# Patient Record
Sex: Female | Born: 1965 | Race: White | Hispanic: No | Marital: Married | Smoking: Never smoker
Health system: Southern US, Community
[De-identification: ages and names within clinical notes are randomized; demographics above are authoritative.]

## PROBLEM LIST (undated history)

## (undated) DIAGNOSIS — C801 Malignant (primary) neoplasm, unspecified: Secondary | ICD-10-CM

## (undated) DIAGNOSIS — M1990 Unspecified osteoarthritis, unspecified site: Secondary | ICD-10-CM

## (undated) DIAGNOSIS — R112 Nausea with vomiting, unspecified: Secondary | ICD-10-CM

## (undated) DIAGNOSIS — G709 Myoneural disorder, unspecified: Secondary | ICD-10-CM

## (undated) DIAGNOSIS — K219 Gastro-esophageal reflux disease without esophagitis: Secondary | ICD-10-CM

## (undated) DIAGNOSIS — Z9889 Other specified postprocedural states: Secondary | ICD-10-CM

## (undated) DIAGNOSIS — F419 Anxiety disorder, unspecified: Secondary | ICD-10-CM

## (undated) DIAGNOSIS — D649 Anemia, unspecified: Secondary | ICD-10-CM

## (undated) DIAGNOSIS — Z8489 Family history of other specified conditions: Secondary | ICD-10-CM

## (undated) HISTORY — PX: BREAST SURGERY: SHX581

## (undated) HISTORY — PX: POLYPECTOMY: SHX149

## (undated) HISTORY — PX: ESOPHAGOGASTRODUODENOSCOPY (EGD) WITH ESOPHAGEAL DILATION: SHX5812

## (undated) HISTORY — PX: EYE SURGERY: SHX253

## (undated) HISTORY — PX: KNEE SURGERY: SHX244

## (undated) HISTORY — DX: Unspecified osteoarthritis, unspecified site: M19.90

## (undated) HISTORY — PX: FRACTURE SURGERY: SHX138

## (undated) HISTORY — DX: Myoneural disorder, unspecified: G70.9

## (undated) HISTORY — DX: Gastro-esophageal reflux disease without esophagitis: K21.9

## (undated) HISTORY — PX: SHOULDER SURGERY: SHX246

## (undated) HISTORY — PX: COLONOSCOPY: SHX174

## (undated) HISTORY — PX: FACIAL FRACTURE SURGERY: SHX1570

---

## 1998-03-04 ENCOUNTER — Inpatient Hospital Stay (HOSPITAL_COMMUNITY): Admission: AD | Admit: 1998-03-04 | Discharge: 1998-03-04 | Payer: Self-pay | Admitting: Obstetrics and Gynecology

## 1998-03-08 ENCOUNTER — Inpatient Hospital Stay (HOSPITAL_COMMUNITY): Admission: AD | Admit: 1998-03-08 | Discharge: 1998-03-08 | Payer: Self-pay | Admitting: *Deleted

## 1998-03-13 ENCOUNTER — Inpatient Hospital Stay (HOSPITAL_COMMUNITY): Admission: AD | Admit: 1998-03-13 | Discharge: 1998-03-16 | Payer: Self-pay | Admitting: *Deleted

## 1998-03-16 ENCOUNTER — Encounter (HOSPITAL_COMMUNITY): Admission: RE | Admit: 1998-03-16 | Discharge: 1998-06-14 | Payer: Self-pay | Admitting: *Deleted

## 1999-03-27 ENCOUNTER — Emergency Department (HOSPITAL_COMMUNITY): Admission: EM | Admit: 1999-03-27 | Discharge: 1999-03-27 | Payer: Self-pay | Admitting: Emergency Medicine

## 1999-03-29 ENCOUNTER — Encounter: Payer: Self-pay | Admitting: *Deleted

## 1999-03-29 ENCOUNTER — Ambulatory Visit: Admission: RE | Admit: 1999-03-29 | Discharge: 1999-03-29 | Payer: Self-pay | Admitting: *Deleted

## 1999-04-18 ENCOUNTER — Encounter: Admission: RE | Admit: 1999-04-18 | Discharge: 1999-07-17 | Payer: Self-pay | Admitting: *Deleted

## 1999-07-27 ENCOUNTER — Other Ambulatory Visit: Admission: RE | Admit: 1999-07-27 | Discharge: 1999-07-27 | Payer: Self-pay | Admitting: *Deleted

## 2000-08-27 ENCOUNTER — Other Ambulatory Visit: Admission: RE | Admit: 2000-08-27 | Discharge: 2000-08-27 | Payer: Self-pay | Admitting: *Deleted

## 2001-09-24 ENCOUNTER — Other Ambulatory Visit: Admission: RE | Admit: 2001-09-24 | Discharge: 2001-09-24 | Payer: Self-pay | Admitting: *Deleted

## 2002-05-23 ENCOUNTER — Encounter: Admission: RE | Admit: 2002-05-23 | Discharge: 2002-05-23 | Payer: Self-pay | Admitting: Family Medicine

## 2002-05-23 ENCOUNTER — Encounter: Payer: Self-pay | Admitting: Family Medicine

## 2002-09-06 ENCOUNTER — Emergency Department (HOSPITAL_COMMUNITY): Admission: EM | Admit: 2002-09-06 | Discharge: 2002-09-06 | Payer: Self-pay | Admitting: Emergency Medicine

## 2002-09-06 ENCOUNTER — Encounter: Payer: Self-pay | Admitting: Emergency Medicine

## 2002-12-27 ENCOUNTER — Other Ambulatory Visit: Admission: RE | Admit: 2002-12-27 | Discharge: 2002-12-27 | Payer: Self-pay | Admitting: *Deleted

## 2004-01-09 ENCOUNTER — Other Ambulatory Visit: Admission: RE | Admit: 2004-01-09 | Discharge: 2004-01-09 | Payer: Self-pay | Admitting: *Deleted

## 2004-03-15 ENCOUNTER — Encounter: Admission: RE | Admit: 2004-03-15 | Discharge: 2004-03-15 | Payer: Self-pay | Admitting: Family Medicine

## 2005-01-16 ENCOUNTER — Other Ambulatory Visit: Admission: RE | Admit: 2005-01-16 | Discharge: 2005-01-16 | Payer: Self-pay | Admitting: Obstetrics and Gynecology

## 2006-02-20 ENCOUNTER — Other Ambulatory Visit: Admission: RE | Admit: 2006-02-20 | Discharge: 2006-02-20 | Payer: Self-pay | Admitting: Obstetrics and Gynecology

## 2006-08-21 ENCOUNTER — Emergency Department (HOSPITAL_COMMUNITY): Admission: EM | Admit: 2006-08-21 | Discharge: 2006-08-21 | Payer: Self-pay | Admitting: Family Medicine

## 2006-08-21 ENCOUNTER — Ambulatory Visit (HOSPITAL_COMMUNITY): Admission: RE | Admit: 2006-08-21 | Discharge: 2006-08-21 | Payer: Self-pay | Admitting: Family Medicine

## 2009-03-16 ENCOUNTER — Emergency Department (HOSPITAL_COMMUNITY): Admission: EM | Admit: 2009-03-16 | Discharge: 2009-03-16 | Payer: Self-pay | Admitting: *Deleted

## 2010-04-09 ENCOUNTER — Encounter: Admission: RE | Admit: 2010-04-09 | Discharge: 2010-04-09 | Payer: Self-pay | Admitting: Obstetrics and Gynecology

## 2010-12-25 ENCOUNTER — Emergency Department (HOSPITAL_COMMUNITY)
Admission: EM | Admit: 2010-12-25 | Discharge: 2010-12-25 | Payer: Self-pay | Source: Home / Self Care | Admitting: Emergency Medicine

## 2010-12-31 LAB — URINALYSIS, ROUTINE W REFLEX MICROSCOPIC
Bilirubin Urine: NEGATIVE
Nitrite: NEGATIVE
Protein, ur: 30 mg/dL — AB
Specific Gravity, Urine: 1.03 (ref 1.005–1.030)
Urine Glucose, Fasting: NEGATIVE mg/dL
Urobilinogen, UA: 0.2 mg/dL (ref 0.0–1.0)
pH: 6.5 (ref 5.0–8.0)

## 2010-12-31 LAB — DIFFERENTIAL
Basophils Absolute: 0.1 K/uL (ref 0.0–0.1)
Basophils Relative: 1 % (ref 0–1)
Eosinophils Absolute: 0.1 K/uL (ref 0.0–0.7)
Eosinophils Relative: 1 % (ref 0–5)
Lymphocytes Relative: 22 % (ref 12–46)
Lymphs Abs: 1.5 K/uL (ref 0.7–4.0)
Monocytes Absolute: 0.6 K/uL (ref 0.1–1.0)
Monocytes Relative: 8 % (ref 3–12)
Neutro Abs: 4.7 K/uL (ref 1.7–7.7)
Neutrophils Relative %: 68 % (ref 43–77)

## 2010-12-31 LAB — POCT I-STAT, CHEM 8
BUN: 9 mg/dL (ref 6–23)
Calcium, Ion: 1.28 mmol/L (ref 1.12–1.32)
Chloride: 103 meq/L (ref 96–112)
Creatinine, Ser: 1 mg/dL (ref 0.4–1.2)
Glucose, Bld: 88 mg/dL (ref 70–99)
HCT: 39 % (ref 36.0–46.0)
Hemoglobin: 13.3 g/dL (ref 12.0–15.0)
Potassium: 3.5 meq/L (ref 3.5–5.1)
Sodium: 139 meq/L (ref 135–145)
TCO2: 28 mmol/L (ref 0–100)

## 2010-12-31 LAB — URINE MICROSCOPIC-ADD ON

## 2010-12-31 LAB — CBC
HCT: 36.2 % (ref 36.0–46.0)
Hemoglobin: 12.1 g/dL (ref 12.0–15.0)
MCH: 29.1 pg (ref 26.0–34.0)
MCHC: 33.4 g/dL (ref 30.0–36.0)
MCV: 87 fL (ref 78.0–100.0)
Platelets: 319 K/uL (ref 150–400)
RBC: 4.16 MIL/uL (ref 3.87–5.11)
RDW: 12.9 % (ref 11.5–15.5)
WBC: 6.9 K/uL (ref 4.0–10.5)

## 2010-12-31 LAB — POCT PREGNANCY, URINE: Preg Test, Ur: NEGATIVE

## 2011-01-06 ENCOUNTER — Encounter: Payer: Self-pay | Admitting: Family Medicine

## 2011-03-27 LAB — URINALYSIS, ROUTINE W REFLEX MICROSCOPIC
Bilirubin Urine: NEGATIVE
Glucose, UA: NEGATIVE mg/dL
Ketones, ur: 15 mg/dL — AB
Nitrite: NEGATIVE
Protein, ur: NEGATIVE mg/dL
Specific Gravity, Urine: 1.015 (ref 1.005–1.030)
Urobilinogen, UA: 0.2 mg/dL (ref 0.0–1.0)
pH: 8 (ref 5.0–8.0)

## 2011-03-27 LAB — COMPREHENSIVE METABOLIC PANEL
ALT: 15 U/L (ref 0–35)
AST: 26 U/L (ref 0–37)
Albumin: 4 g/dL (ref 3.5–5.2)
Alkaline Phosphatase: 49 U/L (ref 39–117)
BUN: 9 mg/dL (ref 6–23)
CO2: 22 mEq/L (ref 19–32)
Calcium: 9.4 mg/dL (ref 8.4–10.5)
Chloride: 106 mEq/L (ref 96–112)
Creatinine, Ser: 0.72 mg/dL (ref 0.4–1.2)
GFR calc Af Amer: >60 mL/min (ref >60)
GFR calc non Af Amer: >60 mL/min (ref >60)
Glucose, Bld: 126 mg/dL — ABNORMAL HIGH (ref 70–99)
Potassium: 3.8 mEq/L (ref 3.5–5.1)
Sodium: 136 mEq/L (ref 135–145)
Total Bilirubin: 0.5 mg/dL (ref 0.3–1.2)
Total Protein: 6.8 g/dL (ref 6.0–8.3)

## 2011-03-27 LAB — SAMPLE TO BLOOD BANK

## 2011-03-27 LAB — PROTIME-INR
INR: 1.1 (ref 0.00–1.49)
Prothrombin Time: 14.1 seconds (ref 11.6–15.2)

## 2011-03-27 LAB — CBC
HCT: 33.5 % — ABNORMAL LOW (ref 36.0–46.0)
Hemoglobin: 11.6 g/dL — ABNORMAL LOW (ref 12.0–15.0)
MCHC: 34.5 g/dL (ref 30.0–36.0)
MCV: 86.3 fL (ref 78.0–100.0)
Platelets: 327 10*3/uL (ref 150–400)
RBC: 3.88 MIL/uL (ref 3.87–5.11)
RDW: 13 % (ref 11.5–15.5)
WBC: 8.1 10*3/uL (ref 4.0–10.5)

## 2011-03-27 LAB — DIFFERENTIAL
Basophils Absolute: 0.1 10*3/uL (ref 0.0–0.1)
Basophils Relative: 1 % (ref 0–1)
Eosinophils Absolute: 0 10*3/uL (ref 0.0–0.7)
Eosinophils Relative: 1 % (ref 0–5)
Lymphocytes Relative: 17 % (ref 12–46)
Lymphs Abs: 1.4 10*3/uL (ref 0.7–4.0)
Monocytes Absolute: 0.5 10*3/uL (ref 0.1–1.0)
Monocytes Relative: 6 % (ref 3–12)
Neutro Abs: 6.1 10*3/uL (ref 1.7–7.7)
Neutrophils Relative %: 75 % (ref 43–77)

## 2011-03-27 LAB — URINE MICROSCOPIC-ADD ON

## 2013-10-19 ENCOUNTER — Other Ambulatory Visit: Payer: Self-pay | Admitting: Sports Medicine

## 2013-10-19 DIAGNOSIS — M84369A Stress fracture, unspecified tibia and fibula, initial encounter for fracture: Secondary | ICD-10-CM

## 2013-10-22 ENCOUNTER — Ambulatory Visit
Admission: RE | Admit: 2013-10-22 | Discharge: 2013-10-22 | Disposition: A | Discharge status: Home / Self Care | Payer: BC Managed Care – PPO | Source: Ambulatory Visit | Attending: Sports Medicine | Admitting: Sports Medicine

## 2013-10-22 DIAGNOSIS — M84369A Stress fracture, unspecified tibia and fibula, initial encounter for fracture: Secondary | ICD-10-CM

## 2014-10-10 ENCOUNTER — Other Ambulatory Visit: Payer: Self-pay | Admitting: Obstetrics and Gynecology

## 2014-10-10 DIAGNOSIS — R928 Other abnormal and inconclusive findings on diagnostic imaging of breast: Secondary | ICD-10-CM

## 2014-10-12 ENCOUNTER — Ambulatory Visit
Admission: RE | Admit: 2014-10-12 | Discharge: 2014-10-12 | Disposition: A | Discharge status: Home / Self Care | Payer: BC Managed Care – PPO | Source: Ambulatory Visit | Attending: Obstetrics and Gynecology | Admitting: Obstetrics and Gynecology

## 2014-10-12 DIAGNOSIS — R928 Other abnormal and inconclusive findings on diagnostic imaging of breast: Secondary | ICD-10-CM

## 2014-11-05 ENCOUNTER — Encounter (HOSPITAL_BASED_OUTPATIENT_CLINIC_OR_DEPARTMENT_OTHER): Payer: Self-pay | Admitting: *Deleted

## 2014-11-05 ENCOUNTER — Emergency Department (HOSPITAL_BASED_OUTPATIENT_CLINIC_OR_DEPARTMENT_OTHER): Payer: BC Managed Care – PPO

## 2014-11-05 ENCOUNTER — Emergency Department (HOSPITAL_BASED_OUTPATIENT_CLINIC_OR_DEPARTMENT_OTHER)
Admission: EM | Admit: 2014-11-05 | Discharge: 2014-11-05 | Disposition: A | Discharge status: Home / Self Care | Payer: BC Managed Care – PPO | Attending: Emergency Medicine | Admitting: Emergency Medicine

## 2014-11-05 ENCOUNTER — Emergency Department (HOSPITAL_COMMUNITY): Payer: BC Managed Care – PPO

## 2014-11-05 DIAGNOSIS — Y9289 Other specified places as the place of occurrence of the external cause: Secondary | ICD-10-CM | POA: Diagnosis not present

## 2014-11-05 DIAGNOSIS — Y998 Other external cause status: Secondary | ICD-10-CM | POA: Insufficient documentation

## 2014-11-05 DIAGNOSIS — W01198A Fall on same level from slipping, tripping and stumbling with subsequent striking against other object, initial encounter: Secondary | ICD-10-CM | POA: Diagnosis not present

## 2014-11-05 DIAGNOSIS — S0990XA Unspecified injury of head, initial encounter: Secondary | ICD-10-CM | POA: Diagnosis present

## 2014-11-05 DIAGNOSIS — S060X0A Concussion without loss of consciousness, initial encounter: Secondary | ICD-10-CM | POA: Insufficient documentation

## 2014-11-05 DIAGNOSIS — R519 Headache, unspecified: Secondary | ICD-10-CM

## 2014-11-05 DIAGNOSIS — R51 Headache: Secondary | ICD-10-CM

## 2014-11-05 DIAGNOSIS — S161XXA Strain of muscle, fascia and tendon at neck level, initial encounter: Secondary | ICD-10-CM | POA: Insufficient documentation

## 2014-11-05 DIAGNOSIS — Y9351 Activity, roller skating (inline) and skateboarding: Secondary | ICD-10-CM | POA: Diagnosis not present

## 2014-11-05 MED ORDER — CYCLOBENZAPRINE HCL 10 MG PO TABS: 10.0000 mg | ORAL_TABLET | Freq: Two times a day (BID) | ORAL | Status: DC | PRN

## 2014-11-05 MED ORDER — HYDROCODONE-ACETAMINOPHEN 5-325 MG PO TABS: 1.0000 | ORAL_TABLET | Freq: Four times a day (QID) | ORAL | Status: DC | PRN

## 2014-11-05 NOTE — ED Notes (Signed)
Pt reports she took a bad fall during speed skating practice last night and "smacked my head"- pt was wearing helmet- denies LOC- c/o neck pain- c-collar applied in triage

## 2014-11-05 NOTE — ED Provider Notes (Signed)
CSN: [id number]     Arrival date & time 11/05/14  1349 History   First MD Initiated Contact with Patient 11/05/14 1431     Chief Complaint  Patient presents with  . Head Injury     (Consider location/radiation/quality/duration/timing/severity/associated sxs/prior Treatment) Patient is a 48 y.o. female presenting with head injury. The history is provided by the patient.  Head Injury Location:  R temporal Time since incident:  1 day Mechanism of injury: fall   Mechanism of injury comment:  She was speed rollskating and fell hitting her head.  she was wearing helmet Pain details:    Quality:  Sharp and aching   Severity:  Moderate   Timing:  Constant   Progression:  Unchanged Chronicity:  New Relieved by:  Nothing Worsened by:  Movement Ineffective treatments:  NSAIDs Associated symptoms: neck pain   Associated symptoms: no blurred vision, no difficulty breathing, no disorientation, no focal weakness, no nausea, no numbness, no seizures, no tinnitus and no vomiting   Associated symptoms comment:  Initially when fell was dazed but since just headache   History reviewed. No pertinent past medical history. Past Surgical History  Procedure Laterality Date  . Facial fracture surgery    . Shoulder surgery     No family history on file. History  Substance Use Topics  . Smoking status: Never Smoker   . Smokeless tobacco: Not on file  . Alcohol Use: Yes     Comment: occasional   OB History    No data available     Review of Systems  HENT: Negative for tinnitus.   Eyes: Negative for blurred vision.  Gastrointestinal: Negative for nausea and vomiting.  Musculoskeletal: Positive for neck pain.  Neurological: Negative for focal weakness, seizures and numbness.  All other systems reviewed and are negative.     Allergies  Codeine and Tramadol  Home Medications   Prior to Admission medications   Medication Sig Start Date End Date Taking? Authorizing Provider  ibuprofen  (ADVIL,MOTRIN) 200 MG tablet Take 600 mg by mouth every 6 (six) hours as needed.   Yes Historical Provider, MD   BP 99/58 mmHg  Pulse 70  Temp(Src) 98.1 F (36.7 C) (Oral)  Resp 18  Ht 5\' 3"  (1.6 m)  Wt 140 lb (63.504 kg)  BMI 24.81 kg/m2  SpO2 97% Physical Exam  Constitutional: She is oriented to person, place, and time. She appears well-developed and well-nourished. No distress.  HENT:  Head: Normocephalic. Head is with contusion.    Right Ear: Tympanic membrane and ear canal normal.  Left Ear: Tympanic membrane and ear canal normal.  Eyes: EOM are normal. Pupils are equal, round, and reactive to light.  Neck: Spinous process tenderness and muscular tenderness present.    Cardiovascular: Normal rate, regular rhythm, normal heart sounds and intact distal pulses.  Exam reveals no friction rub.   No murmur heard. Pulmonary/Chest: Effort normal and breath sounds normal. She has no wheezes. She has no rales.  Abdominal: Soft. Bowel sounds are normal. She exhibits no distension. There is no tenderness. There is no rebound and no guarding.  Musculoskeletal: Normal range of motion. She exhibits no tenderness.  No edema  Neurological: She is alert and oriented to person, place, and time. No cranial nerve deficit.  Skin: Skin is warm and dry. No rash noted.  Psychiatric: She has a normal mood and affect. Her behavior is normal.  Nursing note and vitals reviewed.   ED Course  Procedures (including critical  care time) Labs Review Labs Reviewed - No data to display  Imaging Review Ct Head Wo Contrast  11/05/2014   CLINICAL DATA:  Patient states she fell last night while speed skating and hit her head on the right side. Patient wearing helmet  EXAM: CT HEAD WITHOUT CONTRAST  CT CERVICAL SPINE WITHOUT CONTRAST  TECHNIQUE: Multidetector CT imaging of the head and cervical spine was performed following the standard protocol without intravenous contrast. Multiplanar CT image  reconstructions of the cervical spine were also generated.  COMPARISON:  None.  FINDINGS: CT HEAD FINDINGS  No intracranial hemorrhage. No parenchymal contusion. No midline shift or mass effect. Basilar cisterns are patent. No skull base fracture. No fluid in the paranasal sinuses or mastoid air cells. Orbits are normal.  CT CERVICAL SPINE FINDINGS  No prevertebral soft tissue swelling. Normal alignment of cervical vertebral bodies. No loss of vertebral body height. Normal facet articulation. Normal craniocervical junction.  No evidence epidural or paraspinal hematoma.  IMPRESSION: 1. Normal head CT. 2. No  cervical spine fracture   Electronically Signed   By: Suzy Bouchard M.D.   On: 11/05/2014 15:24   Ct Cervical Spine Wo Contrast  11/05/2014   CLINICAL DATA:  Patient states she fell last night while speed skating and hit her head on the right side. Patient wearing helmet  EXAM: CT HEAD WITHOUT CONTRAST  CT CERVICAL SPINE WITHOUT CONTRAST  TECHNIQUE: Multidetector CT imaging of the head and cervical spine was performed following the standard protocol without intravenous contrast. Multiplanar CT image reconstructions of the cervical spine were also generated.  COMPARISON:  None.  FINDINGS: CT HEAD FINDINGS  No intracranial hemorrhage. No parenchymal contusion. No midline shift or mass effect. Basilar cisterns are patent. No skull base fracture. No fluid in the paranasal sinuses or mastoid air cells. Orbits are normal.  CT CERVICAL SPINE FINDINGS  No prevertebral soft tissue swelling. Normal alignment of cervical vertebral bodies. No loss of vertebral body height. Normal facet articulation. Normal craniocervical junction.  No evidence epidural or paraspinal hematoma.  IMPRESSION: 1. Normal head CT. 2. No  cervical spine fracture   Electronically Signed   By: Suzy Bouchard M.D.   On: 11/05/2014 15:24     EKG Interpretation None      MDM   Final diagnoses:  Headache  Concussion, without loss of  consciousness, initial encounter  Cervical strain, initial encounter    Patient with a head injury last night while speed skating. Also complaining of headache and neck pain. No focal neurologic deficits on exam. Mild contusion to the right side of the head. She was wearing a helmet. CT of the head and neck is negative. Patient diagnosed with concussion and given pain control and muscle relaxers. She was discharged home.    Blanchie Dessert, MD 11/05/14 1529

## 2015-09-11 ENCOUNTER — Other Ambulatory Visit: Payer: Self-pay | Admitting: Obstetrics and Gynecology

## 2015-09-11 DIAGNOSIS — R921 Mammographic calcification found on diagnostic imaging of breast: Secondary | ICD-10-CM

## 2015-10-25 ENCOUNTER — Ambulatory Visit
Admission: RE | Admit: 2015-10-25 | Discharge: 2015-10-25 | Disposition: A | Discharge status: Home / Self Care | Payer: BLUE CROSS/BLUE SHIELD | Source: Ambulatory Visit | Attending: Obstetrics and Gynecology | Admitting: Obstetrics and Gynecology

## 2015-10-25 DIAGNOSIS — R921 Mammographic calcification found on diagnostic imaging of breast: Secondary | ICD-10-CM

## 2016-05-21 DIAGNOSIS — K219 Gastro-esophageal reflux disease without esophagitis: Secondary | ICD-10-CM | POA: Insufficient documentation

## 2016-07-08 ENCOUNTER — Other Ambulatory Visit: Payer: Self-pay | Admitting: Obstetrics and Gynecology

## 2016-07-08 DIAGNOSIS — R921 Mammographic calcification found on diagnostic imaging of breast: Secondary | ICD-10-CM

## 2016-10-31 ENCOUNTER — Ambulatory Visit
Admission: RE | Admit: 2016-10-31 | Discharge: 2016-10-31 | Disposition: A | Discharge status: Home / Self Care | Payer: BLUE CROSS/BLUE SHIELD | Source: Ambulatory Visit | Attending: Obstetrics and Gynecology | Admitting: Obstetrics and Gynecology

## 2016-10-31 ENCOUNTER — Other Ambulatory Visit: Payer: Self-pay | Admitting: Obstetrics and Gynecology

## 2016-10-31 DIAGNOSIS — N6320 Unspecified lump in the left breast, unspecified quadrant: Secondary | ICD-10-CM

## 2016-10-31 DIAGNOSIS — N6310 Unspecified lump in the right breast, unspecified quadrant: Secondary | ICD-10-CM

## 2016-10-31 DIAGNOSIS — R921 Mammographic calcification found on diagnostic imaging of breast: Secondary | ICD-10-CM

## 2017-04-15 DIAGNOSIS — R7989 Other specified abnormal findings of blood chemistry: Secondary | ICD-10-CM | POA: Insufficient documentation

## 2017-07-21 ENCOUNTER — Other Ambulatory Visit: Payer: Self-pay | Admitting: Physician Assistant

## 2017-07-21 DIAGNOSIS — M5442 Lumbago with sciatica, left side: Secondary | ICD-10-CM

## 2017-07-29 ENCOUNTER — Other Ambulatory Visit: Payer: BLUE CROSS/BLUE SHIELD

## 2017-09-05 ENCOUNTER — Ambulatory Visit
Admission: RE | Admit: 2017-09-05 | Discharge: 2017-09-05 | Disposition: A | Discharge status: Home / Self Care | Payer: BLUE CROSS/BLUE SHIELD | Source: Ambulatory Visit | Attending: Physician Assistant | Admitting: Physician Assistant

## 2017-09-05 DIAGNOSIS — M5442 Lumbago with sciatica, left side: Secondary | ICD-10-CM

## 2017-09-05 MED ORDER — GADOBENATE DIMEGLUMINE 529 MG/ML IV SOLN
15.0000 mL | Freq: Once | INTRAVENOUS | Status: AC | PRN
Start: 2017-09-05 — End: 2017-09-05
  Administered 2017-09-05: 15 mL via INTRAVENOUS

## 2017-09-29 ENCOUNTER — Other Ambulatory Visit: Payer: Self-pay | Admitting: Obstetrics and Gynecology

## 2017-09-29 DIAGNOSIS — R921 Mammographic calcification found on diagnostic imaging of breast: Secondary | ICD-10-CM

## 2017-11-03 ENCOUNTER — Ambulatory Visit
Admission: RE | Admit: 2017-11-03 | Discharge: 2017-11-03 | Disposition: A | Discharge status: Home / Self Care | Payer: BLUE CROSS/BLUE SHIELD | Source: Ambulatory Visit | Attending: Obstetrics and Gynecology | Admitting: Obstetrics and Gynecology

## 2017-11-03 ENCOUNTER — Other Ambulatory Visit: Payer: Self-pay | Admitting: Obstetrics and Gynecology

## 2017-11-03 DIAGNOSIS — R921 Mammographic calcification found on diagnostic imaging of breast: Secondary | ICD-10-CM

## 2017-11-03 DIAGNOSIS — N6320 Unspecified lump in the left breast, unspecified quadrant: Secondary | ICD-10-CM

## 2017-11-05 ENCOUNTER — Other Ambulatory Visit: Payer: Self-pay | Admitting: Obstetrics and Gynecology

## 2017-11-05 ENCOUNTER — Ambulatory Visit
Admission: RE | Admit: 2017-11-05 | Discharge: 2017-11-05 | Disposition: A | Discharge status: Home / Self Care | Payer: BLUE CROSS/BLUE SHIELD | Source: Ambulatory Visit | Attending: Obstetrics and Gynecology | Admitting: Obstetrics and Gynecology

## 2017-11-05 DIAGNOSIS — N6320 Unspecified lump in the left breast, unspecified quadrant: Secondary | ICD-10-CM

## 2017-11-05 DIAGNOSIS — R921 Mammographic calcification found on diagnostic imaging of breast: Secondary | ICD-10-CM

## 2017-12-25 DIAGNOSIS — M722 Plantar fascial fibromatosis: Secondary | ICD-10-CM | POA: Insufficient documentation

## 2017-12-25 DIAGNOSIS — M67439 Ganglion, unspecified wrist: Secondary | ICD-10-CM | POA: Insufficient documentation

## 2018-03-18 DIAGNOSIS — M25561 Pain in right knee: Secondary | ICD-10-CM | POA: Insufficient documentation

## 2018-03-18 DIAGNOSIS — S8991XA Unspecified injury of right lower leg, initial encounter: Secondary | ICD-10-CM | POA: Insufficient documentation

## 2018-05-02 DIAGNOSIS — S83249A Other tear of medial meniscus, current injury, unspecified knee, initial encounter: Secondary | ICD-10-CM | POA: Insufficient documentation

## 2018-06-17 DIAGNOSIS — Z96651 Presence of right artificial knee joint: Secondary | ICD-10-CM | POA: Insufficient documentation

## 2018-11-18 ENCOUNTER — Other Ambulatory Visit: Payer: Self-pay | Admitting: Family Medicine

## 2018-11-18 ENCOUNTER — Ambulatory Visit
Admission: RE | Admit: 2018-11-18 | Discharge: 2018-11-18 | Disposition: A | Discharge status: Home / Self Care | Payer: BLUE CROSS/BLUE SHIELD | Source: Ambulatory Visit | Attending: Family Medicine | Admitting: Family Medicine

## 2018-11-18 DIAGNOSIS — R059 Cough, unspecified: Secondary | ICD-10-CM

## 2018-11-18 DIAGNOSIS — R05 Cough: Secondary | ICD-10-CM

## 2018-11-23 ENCOUNTER — Other Ambulatory Visit: Payer: Self-pay | Admitting: Family Medicine

## 2018-11-23 DIAGNOSIS — R059 Cough, unspecified: Secondary | ICD-10-CM

## 2018-11-23 DIAGNOSIS — D509 Iron deficiency anemia, unspecified: Secondary | ICD-10-CM | POA: Insufficient documentation

## 2018-11-23 DIAGNOSIS — R05 Cough: Secondary | ICD-10-CM

## 2018-11-25 ENCOUNTER — Other Ambulatory Visit: Payer: BLUE CROSS/BLUE SHIELD

## 2018-12-02 ENCOUNTER — Other Ambulatory Visit: Payer: BLUE CROSS/BLUE SHIELD

## 2018-12-14 ENCOUNTER — Other Ambulatory Visit: Payer: Self-pay | Admitting: Obstetrics and Gynecology

## 2018-12-14 DIAGNOSIS — Z1231 Encounter for screening mammogram for malignant neoplasm of breast: Secondary | ICD-10-CM

## 2019-01-12 ENCOUNTER — Ambulatory Visit
Admission: RE | Admit: 2019-01-12 | Discharge: 2019-01-12 | Disposition: A | Discharge status: Home / Self Care | Payer: BLUE CROSS/BLUE SHIELD | Source: Ambulatory Visit | Attending: Obstetrics and Gynecology | Admitting: Obstetrics and Gynecology

## 2019-01-12 DIAGNOSIS — Z1231 Encounter for screening mammogram for malignant neoplasm of breast: Secondary | ICD-10-CM

## 2019-01-13 ENCOUNTER — Other Ambulatory Visit: Payer: Self-pay | Admitting: Obstetrics and Gynecology

## 2019-01-13 DIAGNOSIS — R928 Other abnormal and inconclusive findings on diagnostic imaging of breast: Secondary | ICD-10-CM

## 2019-01-15 ENCOUNTER — Ambulatory Visit
Admission: RE | Admit: 2019-01-15 | Discharge: 2019-01-15 | Disposition: A | Discharge status: Home / Self Care | Payer: BLUE CROSS/BLUE SHIELD | Source: Ambulatory Visit | Attending: Obstetrics and Gynecology | Admitting: Obstetrics and Gynecology

## 2019-01-15 ENCOUNTER — Other Ambulatory Visit: Payer: Self-pay | Admitting: Obstetrics and Gynecology

## 2019-01-15 DIAGNOSIS — R928 Other abnormal and inconclusive findings on diagnostic imaging of breast: Secondary | ICD-10-CM

## 2019-01-15 DIAGNOSIS — R921 Mammographic calcification found on diagnostic imaging of breast: Secondary | ICD-10-CM

## 2019-01-15 DIAGNOSIS — N6310 Unspecified lump in the right breast, unspecified quadrant: Secondary | ICD-10-CM

## 2019-01-20 ENCOUNTER — Telehealth: Payer: Self-pay | Admitting: Hematology

## 2019-01-20 NOTE — Telephone Encounter (Signed)
Spoke to patient to confirm afternoon [facility] appointment for 2/12, packet mailed to patient

## 2019-01-21 ENCOUNTER — Encounter: Payer: Self-pay | Admitting: *Deleted

## 2019-01-21 DIAGNOSIS — C50411 Malignant neoplasm of upper-outer quadrant of right female breast: Secondary | ICD-10-CM | POA: Insufficient documentation

## 2019-01-21 DIAGNOSIS — C50919 Malignant neoplasm of unspecified site of unspecified female breast: Secondary | ICD-10-CM | POA: Insufficient documentation

## 2019-01-21 DIAGNOSIS — Z170 Estrogen receptor positive status [ER+]: Principal | ICD-10-CM

## 2019-01-27 ENCOUNTER — Other Ambulatory Visit: Payer: Self-pay

## 2019-01-27 ENCOUNTER — Inpatient Hospital Stay: Payer: BLUE CROSS/BLUE SHIELD

## 2019-01-27 ENCOUNTER — Other Ambulatory Visit: Payer: Self-pay | Admitting: *Deleted

## 2019-01-27 ENCOUNTER — Encounter: Payer: Self-pay | Admitting: Physical Therapy

## 2019-01-27 ENCOUNTER — Ambulatory Visit
Admission: RE | Admit: 2019-01-27 | Discharge: 2019-01-27 | Disposition: A | Discharge status: Home / Self Care | Payer: BLUE CROSS/BLUE SHIELD | Source: Ambulatory Visit | Attending: Radiation Oncology | Admitting: Radiation Oncology

## 2019-01-27 ENCOUNTER — Ambulatory Visit: Payer: BLUE CROSS/BLUE SHIELD | Admitting: Physical Therapy

## 2019-01-27 ENCOUNTER — Encounter: Payer: Self-pay | Admitting: Hematology

## 2019-01-27 ENCOUNTER — Inpatient Hospital Stay: Payer: BLUE CROSS/BLUE SHIELD | Attending: Hematology | Admitting: Hematology

## 2019-01-27 ENCOUNTER — Ambulatory Visit: Payer: Self-pay | Admitting: General Surgery

## 2019-01-27 VITALS — BP 118/76 | HR 94 | Temp 98.1°F | Resp 18 | Ht 63.0 in | Wt 170.6 lb

## 2019-01-27 DIAGNOSIS — Z170 Estrogen receptor positive status [ER+]: Secondary | ICD-10-CM

## 2019-01-27 DIAGNOSIS — C50411 Malignant neoplasm of upper-outer quadrant of right female breast: Secondary | ICD-10-CM

## 2019-01-27 DIAGNOSIS — R293 Abnormal posture: Secondary | ICD-10-CM | POA: Insufficient documentation

## 2019-01-27 DIAGNOSIS — Z79899 Other long term (current) drug therapy: Secondary | ICD-10-CM | POA: Insufficient documentation

## 2019-01-27 DIAGNOSIS — C50511 Malignant neoplasm of lower-outer quadrant of right female breast: Secondary | ICD-10-CM

## 2019-01-27 DIAGNOSIS — Z791 Long term (current) use of non-steroidal anti-inflammatories (NSAID): Secondary | ICD-10-CM | POA: Insufficient documentation

## 2019-01-27 LAB — CBC WITH DIFFERENTIAL (CANCER CENTER ONLY)
Abs Immature Granulocytes: 0.01 10*3/uL (ref 0.00–0.07)
Basophils Absolute: 0.1 10*3/uL (ref 0.0–0.1)
Basophils Relative: 1 %
Eosinophils Absolute: 0.1 10*3/uL (ref 0.0–0.5)
Eosinophils Relative: 2 %
HCT: 37.3 % (ref 36.0–46.0)
Hemoglobin: 12.3 g/dL (ref 12.0–15.0)
Immature Granulocytes: 0 %
Lymphocytes Relative: 26 %
Lymphs Abs: 1.5 10*3/uL (ref 0.7–4.0)
MCH: 28.9 pg (ref 26.0–34.0)
MCHC: 33 g/dL (ref 30.0–36.0)
MCV: 87.8 fL (ref 80.0–100.0)
Monocytes Absolute: 0.7 10*3/uL (ref 0.1–1.0)
Monocytes Relative: 11 %
Neutro Abs: 3.4 10*3/uL (ref 1.7–7.7)
Neutrophils Relative %: 60 %
Platelet Count: 304 10*3/uL (ref 150–400)
RBC: 4.25 MIL/uL (ref 3.87–5.11)
RDW: 11.9 % (ref 11.5–15.5)
WBC Count: 5.8 10*3/uL (ref 4.0–10.5)
nRBC: 0 % (ref 0.0–0.2)

## 2019-01-27 LAB — CMP (CANCER CENTER ONLY)
ALT: 14 U/L (ref 0–44)
AST: 19 U/L (ref 15–41)
Albumin: 4.1 g/dL (ref 3.5–5.0)
Alkaline Phosphatase: 74 U/L (ref 38–126)
Anion gap: 9 (ref 5–15)
BUN: 12 mg/dL (ref 6–20)
CO2: 28 mmol/L (ref 22–32)
Calcium: 10.3 mg/dL (ref 8.9–10.3)
Chloride: 103 mmol/L (ref 98–111)
Creatinine: 0.84 mg/dL (ref 0.44–1.00)
GFR, Est AFR Am: >60 mL/min (ref >60)
GFR, Estimated: >60 mL/min (ref >60)
Glucose, Bld: 101 mg/dL — ABNORMAL HIGH (ref 70–99)
Potassium: 4.5 mmol/L (ref 3.5–5.1)
Sodium: 140 mmol/L (ref 135–145)
Total Bilirubin: 0.5 mg/dL (ref 0.3–1.2)
Total Protein: 7.6 g/dL (ref 6.5–8.1)

## 2019-01-27 NOTE — Progress Notes (Signed)
[place] Psychosocial Distress Screening Spiritual Care  Met with Nela in [facility] to introduce [facility] team/resources, reviewing distress screen per protocol.  The patient scored a 6 on the Psychosocial Distress Thermometer which indicates moderate distress. Also assessed for distress and other psychosocial needs.   ON[facility] DISTRESS SCREENING 01/27/2019  Screening Type Initial Screening  Distress experienced in past week (1-10) 6  Practical problem type Insurance;Childcare  Emotional problem type Nervousness/Anxiety;Adjusting to illness;Adjusting to appearance changes  Information Concerns Type Lack of info about diagnosis;Lack of info about treatment;Lack of info about complementary therapy choices;Lack of info about maintaining fitness  Physical Problem type Pain;Sleep/insomnia;Tingling hands/feet;Skin dry/itchy  Referral to support programs Yes   The pt presented to [facility] with her husband, 2 sisters, and brother-in-law. The pt expressed that since receiving additional information about her diagnosis and treatment plan, her distress has decreased to a 3. The pt identified that her stress has remained at a 3 due to financial and childcare challenges. The pt was informed about CSW services in the [facility]. The pt indicated that she does not currently require any support from the [facility], but she will reach out if a need arises.  Follow up needed: No.  Doris Cheadle, Counseling Intern [phone]

## 2019-01-27 NOTE — Progress Notes (Signed)
Radiation Oncology         [phone] ________________________________  Multidisciplinary Breast Oncology Clinic [facility]) Initial Outpatient Consultation  Name: [name] MRN: [id number]  Date: 01/27/2019  DOB: 1966-10-24  ZY:[id number], Gracelyn Nurse, PA-C  Heywood Bene, *   REFERRING PHYSICIAN: Heywood Bene, *  DIAGNOSIS: The encounter diagnosis was Malignant neoplasm of upper-outer quadrant of right breast in female, estrogen receptor positive ([place]).  Stage IA (cT1b, cN0, cM0), Right Breast UOQ Invasive Lobular Carcinoma, ER+ / PR- / Her2-, Grade 1    ICD-10-CM   1. Malignant neoplasm of upper-outer quadrant of right breast in female, estrogen receptor positive ([place]) C50.411    Z17.0     HISTORY OF PRESENT ILLNESS::[name] is a 53 y.o. female who is presenting to the office today for evaluation of her newly diagnosed breast cancer. She is accompanied by her husband and several family members. She is doing well overall.   She had routine screening mammography on January 12, 2019 showing a possible abnormality in the right breast. She underwent bilateral diagnostic mammography with tomography and right breast ultrasonography at [facility] on January 31 showing: right breast upper outer quadrant 7 mm group of indeterminate Calcifications, adjacent right breast 10 o'clock 7 mm indeterminate mass, and no evidence of right axillary lymphadenopathy.  Biopsy on January 31 showed: invasive and in situ mammary carcinoma with calcifications. Prognostic indicators significant for: estrogen receptor, 10% positive with moderate staining intensity and progesterone receptor, 0% negative. Proliferation marker Ki67 at <1%. HER2 negative.  Menarche: 53 years old Age at first live birth: 54 years old GP: GxP3 LMP: at least 10 years ago Contraceptive: unknown HRT: no   The patient was referred today for presentation in the multidisciplinary conference.  Radiology  studies and pathology slides were presented there for review and discussion of treatment options.  A consensus was discussed regarding potential next steps.  PREVIOUS RADIATION THERAPY: No  PAST MEDICAL HISTORY:  has a past medical history of Arthritis and GERD (gastroesophageal reflux disease).    PAST SURGICAL HISTORY: Past Surgical History:  Procedure Laterality Date  . FACIAL FRACTURE SURGERY    . KNEE SURGERY    . SHOULDER SURGERY      FAMILY HISTORY: family history includes Brain cancer in her father; Cancer in her maternal uncle and paternal uncle.  SOCIAL HISTORY:  reports that she has never smoked. She does not have any smokeless tobacco history on file. She reports current alcohol use. She reports that she does not use drugs.  ALLERGIES: Codeine and Tramadol  MEDICATIONS:  Current Outpatient Medications  Medication Sig Dispense Refill  . cyclobenzaprine (FLEXERIL) 10 MG tablet Take 1 tablet (10 mg total) by mouth 2 (two) times daily as needed for muscle spasms. 20 tablet 0  . FLUoxetine (PROZAC) 20 MG tablet Take 20 mg by mouth daily.    Marland Kitchen ibuprofen (ADVIL,MOTRIN) 200 MG tablet Take 600 mg by mouth every 6 (six) hours as needed.    Marland Kitchen omeprazole (PRILOSEC) 20 MG capsule Take 20 mg by mouth daily.     No current facility-administered medications for this encounter.     REVIEW OF SYSTEMS:  REVIEW OF SYSTEMS: A 10+ POINT REVIEW OF SYSTEMS WAS OBTAINED including neurology, dermatology, psychiatry, cardiac, respiratory, lymph, extremities, GI, GU, musculoskeletal, constitutional, reproductive, HEENT.On the provided form, she reports night sweats, loss of sleep, fatigue, throbbing knee pain, breast pain, easily bruising skin, joint pain, arthritis, difficulties walking, headaches, numbness, and  hot flashes. She denies any other symptoms.    PHYSICAL EXAM:  Vitals with BMI 01/27/2019  Height '5\' 3"'   Weight 170 lbs 10 oz  BMI 98.33  Systolic 825  Diastolic 76  Pulse 94    Respirations 18   Lungs are clear to auscultation bilaterally. Heart has regular rate and rhythm. No palpable cervical, supraclavicular, or axillary adenopathy. Abdomen soft, non-tender, normal bowel sounds. Breast: Left breast with no palpable mass, nipple discharge, or bleeding. Right breast with a small biopsy site in the UOQ. No significant bruising, no palpable mass, nipple discharge or bleeding.   ECOG = 0  0 - Asymptomatic (Fully active, able to carry on all predisease activities without restriction)  1 - Symptomatic but completely ambulatory (Restricted in physically strenuous activity but ambulatory and able to carry out work of a light or sedentary nature. For example, light housework, office work)  2 - Symptomatic, <50% in bed during the day (Ambulatory and capable of all self care but unable to carry out any work activities. Up and about more than 50% of waking hours)  3 - Symptomatic, >50% in bed, but not bedbound (Capable of only limited self-care, confined to bed or chair 50% or more of waking hours)  4 - Bedbound (Completely disabled. Cannot carry on any self-care. Totally confined to bed or chair)  5 - Death   Eustace Pen MM, Creech RH, Tormey DC, et al. [phone]). "Toxicity and response criteria of the [facility]". [place] Oncol. 5 (6): 649-55  LABORATORY DATA:  Lab Results  Component Value Date   WBC 5.8 01/27/2019   HGB 12.3 01/27/2019   HCT 37.3 01/27/2019   MCV 87.8 01/27/2019   PLT 304 01/27/2019   Lab Results  Component Value Date   NA 140 01/27/2019   K 4.5 01/27/2019   CL 103 01/27/2019   CO2 28 01/27/2019   Lab Results  Component Value Date   ALT 14 01/27/2019   AST 19 01/27/2019   ALKPHOS 74 01/27/2019   BILITOT 0.5 01/27/2019    PULMONARY FUNCTION TEST:   Recent Review Flowsheet Data    There is no flowsheet data to display.      RADIOGRAPHY: US Breast Ltd Uni Right Inc Axilla  Addendum Date: 01/15/2019   ADDENDUM  REPORT: 01/15/2019 14:22 ADDENDUM: The patient was brought back to the ultrasound room today after stereotactic biopsy was performed of the microcalcifications adjacent to the mass seen in the upper outer right breast. Additional ultrasound images were added to the patient's diagnostic ultrasound performed earlier today, demonstrating interval resolution of the 0.7 cm oval mass that was recommended for biopsy. Artifact is seen from the stereotactic biopsy clip, and an adjacent smaller cyst, which was seen during the ultrasound earlier today. Therefore, no target remained for an ultrasound-guided biopsy. This was explained to the patient. Electronically Signed   By: Curlene Dolphin M.D.   On: 01/15/2019 14:22   Result Date: 01/15/2019 CLINICAL DATA:  Right breast possible mass and a cluster of calcifications seen on most recent screening mammography. EXAM: DIGITAL DIAGNOSTIC RIGHT MAMMOGRAM WITH CAD AND TOMO ULTRASOUND RIGHT BREAST COMPARISON:  Previous exam(s). ACR Breast Density Category c: The breast tissue is heterogeneously dense, which may obscure small masses. FINDINGS: Additional mammographic views of the right breast demonstrate persistent circumscribed 7 mm mass in the right breast upper outer quadrant, middle depth. Adjacent to it there is a cluster of loosely grouped round calcifications measuring 5 by 7  by 7 mm. Mammographic images were processed with CAD. On physical exam, no suspicious masses are palpated. Targeted ultrasound is performed, showing right breast 10 o'clock 5 cm from the nipple hypoechoic round mass measuring 0.7 x 0.5 x 0.6 cm. Adjacent to it there is a probable complicated cyst measuring 4 mm. There is no evidence of right axillary lymphadenopathy. IMPRESSION: Right breast upper outer quadrant 7 mm group of indeterminate calcifications. Adjacent right breast 10 o'clock 7 mm indeterminate mass. No evidence of right axillary lymphadenopathy. RECOMMENDATION: Stereotactic core needle biopsy  for right breast calcifications. Ultrasound-guided core needle biopsy of right breast 10 o'clock mass. I have discussed the findings and recommendations with the patient. Results were also provided in writing at the conclusion of the visit. If applicable, a reminder letter will be sent to the patient regarding the next appointment. BI-RADS CATEGORY  4: Suspicious. Electronically Signed: By: Fidela Salisbury M.D. On: 01/15/2019 13:23   Mm Diag Breast Tomo Uni Right  Addendum Date: 01/15/2019   ADDENDUM REPORT: 01/15/2019 14:22 ADDENDUM: The patient was brought back to the ultrasound room today after stereotactic biopsy was performed of the microcalcifications adjacent to the mass seen in the upper outer right breast. Additional ultrasound images were added to the patient's diagnostic ultrasound performed earlier today, demonstrating interval resolution of the 0.7 cm oval mass that was recommended for biopsy. Artifact is seen from the stereotactic biopsy clip, and an adjacent smaller cyst, which was seen during the ultrasound earlier today. Therefore, no target remained for an ultrasound-guided biopsy. This was explained to the patient. Electronically Signed   By: Curlene Dolphin M.D.   On: 01/15/2019 14:22   Result Date: 01/15/2019 CLINICAL DATA:  Right breast possible mass and a cluster of calcifications seen on most recent screening mammography. EXAM: DIGITAL DIAGNOSTIC RIGHT MAMMOGRAM WITH CAD AND TOMO ULTRASOUND RIGHT BREAST COMPARISON:  Previous exam(s). ACR Breast Density Category c: The breast tissue is heterogeneously dense, which may obscure small masses. FINDINGS: Additional mammographic views of the right breast demonstrate persistent circumscribed 7 mm mass in the right breast upper outer quadrant, middle depth. Adjacent to it there is a cluster of loosely grouped round calcifications measuring 5 by 7 by 7 mm. Mammographic images were processed with CAD. On physical exam, no suspicious masses are  palpated. Targeted ultrasound is performed, showing right breast 10 o'clock 5 cm from the nipple hypoechoic round mass measuring 0.7 x 0.5 x 0.6 cm. Adjacent to it there is a probable complicated cyst measuring 4 mm. There is no evidence of right axillary lymphadenopathy. IMPRESSION: Right breast upper outer quadrant 7 mm group of indeterminate calcifications. Adjacent right breast 10 o'clock 7 mm indeterminate mass. No evidence of right axillary lymphadenopathy. RECOMMENDATION: Stereotactic core needle biopsy for right breast calcifications. Ultrasound-guided core needle biopsy of right breast 10 o'clock mass. I have discussed the findings and recommendations with the patient. Results were also provided in writing at the conclusion of the visit. If applicable, a reminder letter will be sent to the patient regarding the next appointment. BI-RADS CATEGORY  4: Suspicious. Electronically Signed: By: Fidela Salisbury M.D. On: 01/15/2019 13:23   Mm 3d Screen Breast Bilateral  Result Date: 01/12/2019 CLINICAL DATA:  Screening. EXAM: DIGITAL SCREENING BILATERAL MAMMOGRAM WITH TOMO AND CAD COMPARISON:  Previous exam(s). ACR Breast Density Category c: The breast tissue is heterogeneously dense, which may obscure small masses. FINDINGS: In the right breast, a possible asymmetry and calcifications warrant further evaluation. In the left breast, no  findings suspicious for malignancy. Images were processed with CAD. IMPRESSION: Further evaluation is suggested for possible asymmetry and calcifications in the right breast. RECOMMENDATION: Diagnostic mammogram and possibly ultrasound of the right breast. (Code:[id number]) The patient will be contacted regarding the findings, and additional imaging will be scheduled. BI-RADS CATEGORY  0: Incomplete. Need additional imaging evaluation and/or prior mammograms for comparison. Electronically Signed   By: Lovey Newcomer M.D.   On: 01/12/2019 16:46   Mm Clip Placement Right  Result  Date: 01/15/2019 CLINICAL DATA:  Stereotactic biopsy was performed of calcifications in the upper-outer right breast. EXAM: DIAGNOSTIC RIGHT MAMMOGRAM POST STEREOTACTIC BIOPSY COMPARISON:  Previous exam(s). FINDINGS: Mammographic images were obtained following stereotactic guided biopsy of a 5 mm group of calcifications. The coil shaped biopsy clip is satisfactorily positioned at the site of the biopsied calcifications. IMPRESSION: Coil shaped biopsy clip in satisfactory position. Final Assessment: Post Procedure Mammograms for Marker Placement Electronically Signed   By: Curlene Dolphin M.D.   On: 01/15/2019 14:07   Mm Rt Breast Bx W Loc Dev 1st Lesion Image Bx Spec Stereo Guide  Addendum Date: 01/19/2019   ADDENDUM REPORT: 01/19/2019 07:02 ADDENDUM: Pathology revealed GRADE I INVASIVE AND IN SITU MAMMARY CARCINOMA WITH CALCIFICATIONS of the Right breast, upper outer. This was found to be concordant by Dr. Curlene Dolphin. Pathology results were discussed with the patient and her husband by telephone. The patient reported doing well after the biopsy with tenderness at the site. Post biopsy instructions and care were reviewed and questions were answered. The patient was encouraged to call [facility] for any additional concerns. The patient was referred to [facility] on January 27, 2019. Pathology results reported by Terie Purser, RN on 01/19/2019. Electronically Signed   By: Curlene Dolphin M.D.   On: 01/19/2019 07:02   Result Date: 01/19/2019 CLINICAL DATA:  Stereotactic biopsy was recommended of calcifications in the upper-outer right breast. Of note, these calcifications are adjacent to a 7 mm low-density mass, that was also recommended for ultrasound-guided biopsy today. EXAM: RIGHT BREAST STEREOTACTIC CORE NEEDLE BIOPSY COMPARISON:  Previous exams. FINDINGS: The patient and I discussed the procedure of  stereotactic-guided biopsy including benefits and alternatives. We discussed the high likelihood of a successful procedure. We discussed the risks of the procedure including infection, bleeding, tissue injury, clip migration, and inadequate sampling. Informed written consent was given. The usual time out protocol was performed immediately prior to the procedure. Using sterile technique and 1% Lidocaine with and without epinephrine as local anesthetic, under stereotactic guidance, a 9 gauge vacuum assisted device was used to perform core needle biopsy of calcifications in the upper-outer quadrant of the right breast using a superior approach. Specimen radiograph was performed showing several calcifications. Specimens with calcifications are identified for pathology. Lesion quadrant: Upper outer quadrant At the conclusion of the procedure, a coil tissue marker clip was deployed into the biopsy cavity. Follow-up 2-view mammogram was performed and dictated separately. IMPRESSION: Stereotactic-guided biopsy of right breast calcifications. No apparent complications. Please note that the patient was taken to ultrasound after the stereotactic biopsy to biopsy a mass seen at 10 o'clock position 5 cm from the nipple under ultrasound. This mass was no longer visualized on ultrasound after the stereotactic biopsy, suggesting that the mass was biopsied and decompressed during the stereotactic biopsy of the adjacent calcifications. Electronically Signed: By: Curlene Dolphin M.D. On: 01/15/2019 14:19  IMPRESSION: Stage IA (cT1b, cN0, cM0) Right breast invasive lobular carcinoma.   Patient will be a good candidate for breast conservation with radiation therapy directed to right breast. We discussed the general course of radiation, potential side effects, and toxicities with radiation and the patient is interested in this approach. She does wish to proceed with this approach. Prior to scheduling her surgery she will undergo  MRI in light of  lobular histology. Patient does report being post-menopausal.    PLAN:  1. MRI (Friday 2/14) 2. Right lumpectomy with SLN 3. XRT 4. AI   ------------------------------------------------  Blair Promise, PhD, MD This document serves as a record of services personally performed by Gery Pray, MD. It was created on his behalf by Mary-Margaret Loma Messing, a trained medical scribe. The creation of this record is based on the scribe's personal observations and the provider's statements to them. This document has been checked and approved by the attending provider.

## 2019-01-27 NOTE — Progress Notes (Signed)
Error

## 2019-01-27 NOTE — Patient Instructions (Addendum)
Physical Therapy Information for After Breast Cancer Surgery/Treatment:   Lymphedema is a swelling condition that you may be at risk for in your arm if you have lymph nodes removed from the armpit area.  After a sentinel node biopsy, the risk is approximately 5-9% and is higher after an axillary node dissection.  There is treatment available for this condition and it is not life-threatening.  Contact your physician or physical therapist with concerns.  You may begin the 4 shoulder/posture exercises (see additional sheet) when permitted by your physician (typically a week after surgery).  If you have drains, you may need to wait until those are removed before beginning range of motion exercises.  A general recommendation is to not lift your arms above shoulder height until drains are removed.  These exercises should be done to your tolerance and gently.  This is not a "no pain/no gain" type of recovery so listen to your body and stretch into the range of motion that you can tolerate, stopping if you have pain.  If you are having immediate reconstruction, ask your plastic surgeon about doing exercises as he or she may want you to wait.  We encourage you to attend the free one time ABC (After Breast Cancer) class offered by  [facility].  You will learn information related to lymphedema risk, prevention and treatment and additional exercises to regain mobility following surgery.  You can call [phone] for more information.  This is offered the 1st and 3rd Monday of each month.  You only attend the class one time.  While undergoing any medical procedure or treatment, try to avoid blood pressure being taken or needle sticks from occurring on the arm on the side of cancer.   This recommendation begins after surgery and continues for the rest of your life.  This may help reduce your risk of getting lymphedema (swelling in your arm).  An excellent resource for those seeking information  on lymphedema is the National Lymphedema Network's web site. It can be accessed at www.lymphnet.org  If you notice swelling in your hand, arm or breast at any time following surgery (even if it is many years from now), please contact your doctor or physical therapist to discuss this.  Lymphedema can be treated at any time but it is easier for you if it is treated early on.  If you feel like your shoulder motion is not returning to normal in a reasonable amount of time, please contact your surgeon or physical therapist.  Marti C. Ab Leaming, PT, CLT [phone]; [address] [place], [place] [address] ABC CLASS After Breast Cancer Class  After Breast Cancer Class is a specially designed exercise class to assist you in a safe recover after having breast cancer surgery.  In this class you will learn how to get back to full function whether your drains were just removed or if you had surgery a month ago.  This one-time class is held the 1st and 3rd Monday of every month from 11:00 a.m. until 12:00 noon at the [facility] located at [address] [place], [place] [address]  This class is FREE and space is limited. For more information or to register for the next available class, call [phone].  Class Goals   Understand specific stretches to improve the flexibility of you chest and shoulder.  Learn ways to safely strengthen your upper body and improve your posture.  Understand the warning signs of infection and why   you may be at risk for an arm infection.  Learn about Lymphedema and prevention.  ** You do not attend this class until after surgery.  Drains must be removed to participate  Patient was instructed today in a home exercise program today for post op shoulder range of motion. These included active assist shoulder flexion in sitting, scapular retraction, wall walking with shoulder abduction, and hands behind head external rotation.  She was  encouraged to do these twice a day, holding 3 seconds and repeating 5 times when permitted by her physician.   Added 03/25/2019: Access Code: [email or web address]  URL: [email or web address]  Date: 03/25/2019  Prepared by: Arbutus Ped   Exercises  Supine Shoulder Flexion AAROM - 10 reps - 3 hold - 1-2x daily - 7x weekly  Standing Shoulder Abduction Slides at Wall - 10 reps - 3 hold - 1-2x daily - 7x weekly  Shoulder Flexion Wall Walk - 10 reps - 3 hold - 1-2x daily - 7x weekly

## 2019-01-27 NOTE — Progress Notes (Signed)
[place]   [phone]) [phone] Fax:([phone]) [place] Note   Patient Care Team: Merwyn Katos as PCP - General (Physician Assistant) Jovita Kussmaul, MD as Consulting Physician (General Surgery) Truitt Merle, MD as Consulting Physician (Hematology) Gery Pray, MD as Consulting Physician (Radiation Oncology) Rockwell Germany, RN as Oncology Nurse Navigator Mauro Kaufmann, RN as Oncology Nurse Navigator  Date of Service:  01/27/2019   CHIEF COMPLAINTS/PURPOSE OF CONSULTATION:  Newly diagnosed right breast cancer   Oncology History   Cancer Staging Malignant neoplasm of upper-outer quadrant of right breast in female, estrogen receptor positive ([place]) Staging form: Breast, AJCC 8th Edition - Clinical stage from 01/15/2019: Stage IA (cT1b, cN0, cM0, G1, ER+, PR-, HER2-) - Signed by Truitt Merle, MD on 01/26/2019       Malignant neoplasm of upper-outer quadrant of right breast in female, estrogen receptor positive ([place])   01/15/2019 Cancer Staging    Staging form: Breast, AJCC 8th Edition - Clinical stage from 01/15/2019: Stage IA (cT1b, cN0, cM0, G1, ER+, PR-, HER2-) - Signed by Truitt Merle, MD on 01/26/2019    01/15/2019 Mammogram    Diagnostic Mammogram 01/15/19  IMPRESSION Targeted ultrasound is performed, showing right breast 10 o'clock 5 cm from the nipple hypoechoic round mass measuring 0.7 x 0.5 x 0.6 cm. Adjacent to it there is a probable complicated cyst measuring 4 mm. There is no evidence of right axillary lymphadenopathy.    01/15/2019 Initial Biopsy    Diagnosis 01/15/19  Breast, right, needle core biopsy, upper outer - INVASIVE AND IN SITU MAMMARY CARCINOMA WITH CALCIFICATIONS. - SEE COMMENT. 1 of 3 FINAL for [place], Hillery P ([id number]) Microscopic Comment The carcinoma appears grade I. The malignant cells are positive for cytokeratin AE1/AE3 and negative for E-cadherin. A breast prognostic profile will be performed and  the results reported separately. Dr Vicente Males has reviewed the case and concurs with this interpretation. The results are called to [facility] on 01/18/2019. (JBK:ah:ecj 01/18/19)    01/15/2019 Receptors her2    PROGNOSTIC INDICATORS Results: IMMUNOHISTOCHEMICAL AND MORPHOMETRIC ANALYSIS PERFORMED MANUALLY The tumor cells are NEGATIVE for Her2 (0). Estrogen Receptor: 10%, POSITIVE, MODERATE STAINING INTENSITY Progesterone Receptor: 0%, NEGATIVE Proliferation Marker Ki67: <1%    01/21/2019 Initial Diagnosis    Malignant neoplasm of upper-outer quadrant of right breast in female, estrogen receptor positive ([place])      HISTORY OF PRESENTING ILLNESS:  [name] 53 y.o. female is a here because of newly diagnosed right breast cancer. The patient presents to the Breast clinic today accompanied by her family.    Her mass was discovered by screening mammogram which she has been having every year.  She had breast biopsy of left breast in 2019 which was benign. She denies change in energy, appetite, or weight outside of menopausal. Symptoms started in 2019.  Today the patient notes she feels overall at baseline.   Socially she is married and has two living children which one is on a ventilator and total care due to [facility] She takes care of her full time with assistance. She would like to have surgery soon as possible because her when her daughter turns 21 soon and she will lose 1 hour care support.  They have a PMHx of arthritis of knees and back which she takes flexeril for.She had orthopedic surgeries in the past. She had her esophagus stretched before. Her father had brain cancer. Her paternal uncle  had stomach cancer and her maternal uncle had melanoma.    GYN HISTORY  Menarchal: 16 LMP: around 2010 after [employer] endometrial ablation Contraceptive: NA HRT: No G3,: first born passed from [place] and youngest is currently on ventilator.     REVIEW OF SYSTEMS:      Constitutional: Denies fevers, chills or abnormal night sweats Eyes: Denies blurriness of vision, double vision or watery eyes Ears, nose, mouth, throat, and face: Denies mucositis or sore throat Respiratory: Denies cough, dyspnea or wheezes Cardiovascular: Denies palpitation, chest discomfort or lower extremity swelling Gastrointestinal:  Denies nausea, heartburn or change in bowel habits Skin: Denies abnormal skin rashes MSK: (+) arthritis, mainly knees and back Lymphatics: Denies new lymphadenopathy or easy bruising Neurological:Denies numbness, tingling or new weaknesses Behavioral/Psych: Mood is stable, no new changes  All other systems were reviewed with the patient and are negative.   MEDICAL HISTORY:  Past Medical History:  Diagnosis Date  . Arthritis   . GERD (gastroesophageal reflux disease)     SURGICAL HISTORY: Past Surgical History:  Procedure Laterality Date  . FACIAL FRACTURE SURGERY    . KNEE SURGERY    . SHOULDER SURGERY      SOCIAL HISTORY: Social History   Socioeconomic History  . Marital status: Married    Spouse name: Not on file  . Number of children: Not on file  . Years of education: Not on file  . Highest education level: Not on file  Occupational History  . Not on file  Social Needs  . Financial resource strain: Not on file  . Food insecurity:    Worry: Not on file    Inability: Not on file  . Transportation needs:    Medical: Not on file    Non-medical: Not on file  Tobacco Use  . Smoking status: Never Smoker  Substance and Sexual Activity  . Alcohol use: Yes    Comment: occasional  . Drug use: No  . Sexual activity: Not on file  Lifestyle  . Physical activity:    Days per week: Not on file    Minutes per session: Not on file  . Stress: Not on file  Relationships  . Social connections:    Talks on phone: Not on file    Gets together: Not on file    Attends religious service: Not on file    Active member of club or  organization: Not on file    Attends meetings of clubs or organizations: Not on file    Relationship status: Not on file  . Intimate partner violence:    Fear of current or ex partner: Not on file    Emotionally abused: Not on file    Physically abused: Not on file    Forced sexual activity: Not on file  Other Topics Concern  . Not on file  Social History Narrative  . Not on file    FAMILY HISTORY: Family History  Problem Relation Age of Onset  . Brain cancer Father   . Cancer Maternal Uncle        melanoma   . Cancer Paternal Uncle        gastric cancer  . Breast cancer Neg Hx     ALLERGIES:  is allergic to codeine and tramadol.  MEDICATIONS:  Current Outpatient Medications  Medication Sig Dispense Refill  . cyclobenzaprine (FLEXERIL) 10 MG tablet Take 1 tablet (10 mg total) by mouth 2 (two) times daily as needed for muscle spasms. 20 tablet 0  .  FLUoxetine (PROZAC) 20 MG tablet Take 20 mg by mouth daily.    Marland Kitchen ibuprofen (ADVIL,MOTRIN) 200 MG tablet Take 600 mg by mouth every 6 (six) hours as needed.    Marland Kitchen omeprazole (PRILOSEC) 20 MG capsule Take 20 mg by mouth daily.     No current facility-administered medications for this visit.     PHYSICAL EXAMINATION: ECOG PERFORMANCE STATUS: 0 - Asymptomatic  Vitals:   01/27/19 1243  BP: 118/76  Pulse: 94  Resp: 18  Temp: 98.1 F (36.7 C)  SpO2: 100%   Filed Weights   01/27/19 1243  Weight: 170 lb 9.6 oz (77.4 kg)    GENERAL:alert, no distress and comfortable SKIN: skin color, texture, turgor are normal, no rashes or significant lesions EYES: normal, conjunctiva are pink and non-injected, sclera clear OROPHARYNX:no exudate, no erythema and lips, buccal mucosa, and tongue normal  NECK: supple, thyroid normal size, non-tender, without nodularity LYMPH:  no palpable lymphadenopathy in the cervical, axillary or inguinal LUNGS: clear to auscultation and percussion with normal breathing effort HEART: regular rate & rhythm  and no murmurs and no lower extremity edema ABDOMEN:abdomen soft, non-tender and normal bowel sounds (+) No organomegaly  Musculoskeletal:no cyanosis of digits and no clubbing  PSYCH: alert & oriented x 3 with fluent speech NEURO: no focal motor/sensory deficits BREAST: (+) Mild skin ecchymosis at biopsy site (+) no palpable mass or adenopathy in either breasts or adenopathy   LABORATORY DATA:  I have reviewed the data as listed CBC Latest Ref Rng & Units 01/27/2019 12/25/2010 12/25/2010  WBC 4.0 - 10.5 K/uL 5.8 - 6.9  Hemoglobin 12.0 - 15.0 g/dL 12.3 13.3 12.1  Hematocrit 36.0 - 46.0 % 37.3 39.0 36.2  Platelets 150 - 400 K/uL 304 - 319    CMP Latest Ref Rng & Units 01/27/2019 12/25/2010 03/16/2009  Glucose 70 - 99 mg/dL 101(H) 88 126(H)  BUN 6 - 20 mg/dL _0 Creatinine 0.44 - 1.00 mg/dL 0.84 1.0 0.72  Sodium 135 - 145 mmol/L 140 139 136  Potassium 3.5 - 5.1 mmol/L 4.5 3.5 3.8 HEMOLYZED SPECIMEN, RESULTS MAY BE AFFECTED  Chloride 98 - 111 mmol/L 103 103 106  CO2 22 - 32 mmol/L 28 - 22  Calcium 8.9 - 10.3 mg/dL 10.3 - 9.4  Total Protein 6.5 - 8.1 g/dL 7.6 - 6.8  Total Bilirubin 0.3 - 1.2 mg/dL 0.5 - 0.5  Alkaline Phos 38 - 126 U/L 74 - 49  AST 15 - 41 U/L 19 - 26  ALT 0 - 44 U/L 14 - 15     RADIOGRAPHIC STUDIES: I have personally reviewed the radiological images as listed and agreed with the findings in the report. US Breast Ltd Uni Right Inc Axilla  Addendum Date: 01/15/2019   ADDENDUM REPORT: 01/15/2019 14:22 ADDENDUM: The patient was brought back to the ultrasound room today after stereotactic biopsy was performed of the microcalcifications adjacent to the mass seen in the upper outer right breast. Additional ultrasound images were added to the patient's diagnostic ultrasound performed earlier today, demonstrating interval resolution of the 0.7 cm oval mass that was recommended for biopsy. Artifact is seen from the stereotactic biopsy clip, and an adjacent smaller cyst, which was  seen during the ultrasound earlier today. Therefore, no target remained for an ultrasound-guided biopsy. This was explained to the patient. Electronically Signed   By: Curlene Dolphin M.D.   On: 01/15/2019 14:22   Result Date: 01/15/2019 CLINICAL DATA:  Right breast possible mass and  a cluster of calcifications seen on most recent screening mammography. EXAM: DIGITAL DIAGNOSTIC RIGHT MAMMOGRAM WITH CAD AND TOMO ULTRASOUND RIGHT BREAST COMPARISON:  Previous exam(s). ACR Breast Density Category c: The breast tissue is heterogeneously dense, which may obscure small masses. FINDINGS: Additional mammographic views of the right breast demonstrate persistent circumscribed 7 mm mass in the right breast upper outer quadrant, middle depth. Adjacent to it there is a cluster of loosely grouped round calcifications measuring 5 by 7 by 7 mm. Mammographic images were processed with CAD. On physical exam, no suspicious masses are palpated. Targeted ultrasound is performed, showing right breast 10 o'clock 5 cm from the nipple hypoechoic round mass measuring 0.7 x 0.5 x 0.6 cm. Adjacent to it there is a probable complicated cyst measuring 4 mm. There is no evidence of right axillary lymphadenopathy. IMPRESSION: Right breast upper outer quadrant 7 mm group of indeterminate calcifications. Adjacent right breast 10 o'clock 7 mm indeterminate mass. No evidence of right axillary lymphadenopathy. RECOMMENDATION: Stereotactic core needle biopsy for right breast calcifications. Ultrasound-guided core needle biopsy of right breast 10 o'clock mass. I have discussed the findings and recommendations with the patient. Results were also provided in writing at the conclusion of the visit. If applicable, a reminder letter will be sent to the patient regarding the next appointment. BI-RADS CATEGORY  4: Suspicious. Electronically Signed: By: Fidela Salisbury M.D. On: 01/15/2019 13:23   Mm Diag Breast Tomo Uni Right  Addendum Date: 01/15/2019     ADDENDUM REPORT: 01/15/2019 14:22 ADDENDUM: The patient was brought back to the ultrasound room today after stereotactic biopsy was performed of the microcalcifications adjacent to the mass seen in the upper outer right breast. Additional ultrasound images were added to the patient's diagnostic ultrasound performed earlier today, demonstrating interval resolution of the 0.7 cm oval mass that was recommended for biopsy. Artifact is seen from the stereotactic biopsy clip, and an adjacent smaller cyst, which was seen during the ultrasound earlier today. Therefore, no target remained for an ultrasound-guided biopsy. This was explained to the patient. Electronically Signed   By: Curlene Dolphin M.D.   On: 01/15/2019 14:22   Result Date: 01/15/2019 CLINICAL DATA:  Right breast possible mass and a cluster of calcifications seen on most recent screening mammography. EXAM: DIGITAL DIAGNOSTIC RIGHT MAMMOGRAM WITH CAD AND TOMO ULTRASOUND RIGHT BREAST COMPARISON:  Previous exam(s). ACR Breast Density Category c: The breast tissue is heterogeneously dense, which may obscure small masses. FINDINGS: Additional mammographic views of the right breast demonstrate persistent circumscribed 7 mm mass in the right breast upper outer quadrant, middle depth. Adjacent to it there is a cluster of loosely grouped round calcifications measuring 5 by 7 by 7 mm. Mammographic images were processed with CAD. On physical exam, no suspicious masses are palpated. Targeted ultrasound is performed, showing right breast 10 o'clock 5 cm from the nipple hypoechoic round mass measuring 0.7 x 0.5 x 0.6 cm. Adjacent to it there is a probable complicated cyst measuring 4 mm. There is no evidence of right axillary lymphadenopathy. IMPRESSION: Right breast upper outer quadrant 7 mm group of indeterminate calcifications. Adjacent right breast 10 o'clock 7 mm indeterminate mass. No evidence of right axillary lymphadenopathy. RECOMMENDATION: Stereotactic core  needle biopsy for right breast calcifications. Ultrasound-guided core needle biopsy of right breast 10 o'clock mass. I have discussed the findings and recommendations with the patient. Results were also provided in writing at the conclusion of the visit. If applicable, a reminder letter will be sent to the  patient regarding the next appointment. BI-RADS CATEGORY  4: Suspicious. Electronically Signed: By: Fidela Salisbury M.D. On: 01/15/2019 13:23   Mm 3d Screen Breast Bilateral  Result Date: 01/12/2019 CLINICAL DATA:  Screening. EXAM: DIGITAL SCREENING BILATERAL MAMMOGRAM WITH TOMO AND CAD COMPARISON:  Previous exam(s). ACR Breast Density Category c: The breast tissue is heterogeneously dense, which may obscure small masses. FINDINGS: In the right breast, a possible asymmetry and calcifications warrant further evaluation. In the left breast, no findings suspicious for malignancy. Images were processed with CAD. IMPRESSION: Further evaluation is suggested for possible asymmetry and calcifications in the right breast. RECOMMENDATION: Diagnostic mammogram and possibly ultrasound of the right breast. (Code:[id number]) The patient will be contacted regarding the findings, and additional imaging will be scheduled. BI-RADS CATEGORY  0: Incomplete. Need additional imaging evaluation and/or prior mammograms for comparison. Electronically Signed   By: Lovey Newcomer M.D.   On: 01/12/2019 16:46   Mm Clip Placement Right  Result Date: 01/15/2019 CLINICAL DATA:  Stereotactic biopsy was performed of calcifications in the upper-outer right breast. EXAM: DIAGNOSTIC RIGHT MAMMOGRAM POST STEREOTACTIC BIOPSY COMPARISON:  Previous exam(s). FINDINGS: Mammographic images were obtained following stereotactic guided biopsy of a 5 mm group of calcifications. The coil shaped biopsy clip is satisfactorily positioned at the site of the biopsied calcifications. IMPRESSION: Coil shaped biopsy clip in satisfactory position. Final  Assessment: Post Procedure Mammograms for Marker Placement Electronically Signed   By: Curlene Dolphin M.D.   On: 01/15/2019 14:07   Mm Rt Breast Bx W Loc Dev 1st Lesion Image Bx Spec Stereo Guide  Addendum Date: 01/19/2019   ADDENDUM REPORT: 01/19/2019 07:02 ADDENDUM: Pathology revealed GRADE I INVASIVE AND IN SITU MAMMARY CARCINOMA WITH CALCIFICATIONS of the Right breast, upper outer. This was found to be concordant by Dr. Curlene Dolphin. Pathology results were discussed with the patient and her husband by telephone. The patient reported doing well after the biopsy with tenderness at the site. Post biopsy instructions and care were reviewed and questions were answered. The patient was encouraged to call [facility] for any additional concerns. The patient was referred to [facility] on January 27, 2019. Pathology results reported by Terie Purser, RN on 01/19/2019. Electronically Signed   By: Curlene Dolphin M.D.   On: 01/19/2019 07:02   Result Date: 01/19/2019 CLINICAL DATA:  Stereotactic biopsy was recommended of calcifications in the upper-outer right breast. Of note, these calcifications are adjacent to a 7 mm low-density mass, that was also recommended for ultrasound-guided biopsy today. EXAM: RIGHT BREAST STEREOTACTIC CORE NEEDLE BIOPSY COMPARISON:  Previous exams. FINDINGS: The patient and I discussed the procedure of stereotactic-guided biopsy including benefits and alternatives. We discussed the high likelihood of a successful procedure. We discussed the risks of the procedure including infection, bleeding, tissue injury, clip migration, and inadequate sampling. Informed written consent was given. The usual time out protocol was performed immediately prior to the procedure. Using sterile technique and 1% Lidocaine with and without epinephrine as local anesthetic, under stereotactic guidance, a 9 gauge  vacuum assisted device was used to perform core needle biopsy of calcifications in the upper-outer quadrant of the right breast using a superior approach. Specimen radiograph was performed showing several calcifications. Specimens with calcifications are identified for pathology. Lesion quadrant: Upper outer quadrant At the conclusion of the procedure, a coil tissue marker clip was deployed into the biopsy cavity. Follow-up 2-view mammogram was performed and  dictated separately. IMPRESSION: Stereotactic-guided biopsy of right breast calcifications. No apparent complications. Please note that the patient was taken to ultrasound after the stereotactic biopsy to biopsy a mass seen at 10 o'clock position 5 cm from the nipple under ultrasound. This mass was no longer visualized on ultrasound after the stereotactic biopsy, suggesting that the mass was biopsied and decompressed during the stereotactic biopsy of the adjacent calcifications. Electronically Signed: By: Curlene Dolphin M.D. On: 01/15/2019 14:19    ASSESSMENT & PLAN:  [name] is a 53 y.o. caucasian female with   1. Malignant neoplasm of upper-outer quadrant of right breast, invasive lobular carcinoma, stage IA, c(T1b,N0,M0), ER+, PR-, HER2- Grade I  -We discussed her image findings and the biopsy results in great details with patient and her family.  -due to her lobular histology and dense breast tissue, will get breast MRI to rule out mutifocal disease  -Giving the small size of tumoe, she is a candidate for lumpectomy and sentinel LN biopsy. She is agreeable with that. She was seen by Dr. Marlou Starks today and likely will proceed with surgery soon.  -We discussed the risk of cancer recurrence after completed surgical resection. -If tumor more than 1cm will do a Oncotype Dx test on the surgical sample and we'll make a decision about adjuvant chemotherapy based on the Oncotype result. I suspect this will be low risk disease giving the low grade  lobular histology.  -she was also seen by radiation oncologist Dr. Sondra Come today.  Adjuvant radiation was recommended after lumpectomy. -Giving the positive ER, I recommend adjuvant endocrine therapy with aromatase inhibitor for a total of 5 years to reduce the risk of cancer recurrence.  She has been having her symptoms such as hot flash for the past 1 year, I will check her [place] level to confirm postmenopausal before we start AI. Potential benefits and side effects were discussed with patient and she is interested.  -Due to the low ER positivity, if she has significant side effects from AI, I will consider stopping.   -We also discussed the breast cancer surveillance after her surgery. She will continue annual screening mammogram, self exam, and a routine office visit with lab and exam with [place]. -Labs reviewed today, CBC and CMP WNL.  -I encouraged her to have healthy diet and exercise regularly -Will f/u after surgery or radiation.    2. Genetic Testing  -She does not some family history for malignancy so I will see if her insurance will cover testing as she is interested.    3. Social support  -She takes care of her daughter full time who is on total care and ventilator  -She has good family support    PLAN:  -breast MRI -Proceed with breast surgery lumpectomy and sentinel lymph node biopsy with Dr. Marlou Starks soon  -consider oncotype if tumor >1.0cm, or G2-3 with tumor>31m   -Will f/u after surgery or radiation   No orders of the defined types were placed in this encounter.   All questions were answered. The patient knows to call the clinic with any problems, questions or concerns. I spent 35 minutes counseling the patient face to face. The total time spent in the appointment was 45 minutes and more than 50% was on counseling.     YTruitt Merle MD 01/27/2019 11:01 PM  I, AJoslyn Devon am acting as scribe for YTruitt Merle MD.   I have reviewed the above documentation for accuracy and  completeness, and I agree with the  above.     

## 2019-01-27 NOTE — Therapy (Signed)
[place], [place], [address] Phone: [phone]   Fax:  [phone]  Physical Therapy Evaluation  Patient Details  Name: NADIRAH SOCORRO MRN: [id number] Date of Birth: [birth year] Referring Provider (PT): Dr. Autumn Messing   Encounter Date: 01/27/2019  PT End of Session - 01/27/19 1623    Visit Number  1    Number of Visits  2    Date for PT Re-Evaluation  03/24/19    PT Start Time  2703    PT Stop Time  1412    PT Time Calculation (min)  24 min    Activity Tolerance  Patient tolerated treatment well    Behavior During Therapy  [facility] for tasks assessed/performed       Past Medical History:  Diagnosis Date  . Arthritis   . GERD (gastroesophageal reflux disease)     Past Surgical History:  Procedure Laterality Date  . FACIAL FRACTURE SURGERY    . KNEE SURGERY    . SHOULDER SURGERY      There were no vitals filed for this visit.   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 It measures 7 mm and is located in the lower outer quadrant. It is ER positive, PR negative, and HER2 negative with a Ki67 of < 1%. She has a history of a right shoulder scope in 2015.     Patient Stated Goals  reduce lymphedema risk and learn post op shoulder ROM HEP    Currently in Pain?  No/denies         [facility] PT Assessment - 01/27/19 0001      Assessment   Medical Diagnosis  Right breast cancer    Referring Provider (PT)  Dr. Autumn Messing    Onset Date/Surgical Date  01/12/19    Hand Dominance  Right    Prior Therapy  None      Precautions   Precautions  Other (comment)    Precaution Comments  active cancer      Restrictions   Weight  Bearing Restrictions  No      Balance Screen   Has the patient fallen in the past 6 months  No    Has the patient had a decrease in activity level because of a fear of falling?   No    Is the patient reluctant to leave their home because of a fear of falling?   No      Home Environment   Living Environment  Private residence    Living Arrangements  Spouse/significant other;Children   Husband and 25 y.o. 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    Leisure  She roller skates and speed skates but not recently.      Cognition   Overall Cognitive Status  Within Functional Limits for tasks assessed      Posture/Postural Control   Posture/Postural Control  Postural limitations    Postural Limitations  Rounded Shoulders;Forward  head;Increased thoracic kyphosis      ROM / Strength   AROM / PROM / Strength  AROM;Strength      AROM   AROM Assessment Site  Shoulder;Cervical    Right/Left Shoulder  Right;Left    Right Shoulder Extension  50 Degrees    Right Shoulder Flexion  152 Degrees    Right Shoulder ABduction  155 Degrees    Right Shoulder Internal Rotation  85 Degrees    Right Shoulder External Rotation  83 Degrees    Left Shoulder Extension  35 Degrees    Left Shoulder Flexion  145 Degrees    Left Shoulder ABduction  165 Degrees    Left Shoulder Internal Rotation  63 Degrees    Left Shoulder External Rotation  80 Degrees    Cervical Flexion  WNL    Cervical Extension  WNL    Cervical - Right Side Bend  25% limited    Cervical - Left Side Bend  25% limited    Cervical - Right Rotation  WNL    Cervical - Left Rotation  WNL      Strength   Overall Strength  Within functional limits for tasks performed        LYMPHEDEMA/ONCOLOGY QUESTIONNAIRE - 01/27/19 1622      Type   Cancer Type   Right breast cancer      Lymphedema Assessments   Lymphedema Assessments  Upper extremities      Right Upper Extremity Lymphedema   10 cm Proximal to Olecranon Process  31.5 cm    Olecranon Process  24.3 cm    15 cm Proximal to Ulnar Styloid Process  22.4 cm    10 cm Proximal to Ulnar Styloid Process  14.4 cm    Just Proximal to Ulnar Styloid Process  18 cm    Across Hand at [employer]  5.6 cm      Left Upper Extremity Lymphedema   10 cm Proximal to Olecranon Process  31.4 cm    Olecranon Process  23.4 cm    15 cm Proximal to Ulnar Styloid Process  21.7 cm    10 cm Proximal to Ulnar Styloid Process  14.8 cm    Just Proximal to Ulnar Styloid Process  18 cm    Across Hand at [employer]  5.6 cm          Quick Dash - 01/27/19 0001    Open a tight or new jar  No difficulty    Do heavy household chores (wash walls, wash floors)  No difficulty    Carry a shopping bag or briefcase  No difficulty    Wash your back  No difficulty    Use a knife to cut food  No difficulty    Recreational activities in which you take some force or impact through your arm, shoulder, or hand (golf, hammering, tennis)  No difficulty    During the past week, to what extent has your arm, shoulder or hand problem interfered with your normal social activities with family, friends, neighbors, or groups?  Not at all    During the past week, to what extent has your arm, shoulder or hand problem limited your work or other regular daily activities  Not at all    Arm, shoulder, or hand pain.  Mild    Tingling (pins and needles) in your arm, shoulder, or hand  None    Difficulty Sleeping  No difficulty    DASH Score  2.27 %        Objective measurements completed on examination: See above findings.         Patient was instructed today in a home exercise program today for post op shoulder range of motion. These included active assist shoulder flexion in sitting, scapular retraction, wall walking with  shoulder abduction, and hands behind head external rotation.  She was encouraged to do these twice a day, holding 3 seconds and repeating 5 times when permitted by her physician.         PT Education - 01/27/19 1622    Education Details  Lymphedema risk reduction and post op shoulder ROM HEP    Person(s) Educated  Patient;Spouse    Methods  Explanation;Demonstration;Handout    Comprehension  Returned demonstration;Verbalized understanding           Breast Clinic Goals - 01/27/19 1630      Patient will be able to verbalize understanding of pertinent lymphedema risk reduction practices relevant to her diagnosis specifically related to skin care.   Time  1    Period  Days    Status  Achieved      Patient will be able to return demonstrate and/or verbalize understanding of the post-op home exercise program related to regaining shoulder range of motion.   Time  1    Period  Days    Status  Achieved      Patient will be able to verbalize understanding of the importance of attending the postoperative After Breast Cancer Class for further lymphedema risk reduction education and therapeutic exercise.   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 It measures 7 mm and is located in the lower outer quadrant. It is ER positive, PR negative, and HER2 negative with a Ki67 of < 1%. She has a history of a right shoulder scope in 2015.  Her multidisciplinary medical team met prior to her assessments to determine a recommended treatment plan. She is planning to have a right lumpectomy and sentinel node biopsy followed by radiation and anti-estrogen therapy. She will benefit from a post op PT visit to reassess and determine needs.    History and Personal Factors relevant to plan of care:  Right shoulder scope 2015    Clinical  Presentation  Stable    Clinical Decision Making  Low    Rehab Potential  Excellent    Clinical Impairments Affecting Rehab Potential  None    PT Frequency  --   Eval and 1 f/u visit   PT Treatment/Interventions  ADLs/Self Care Home Management;Patient/family education;Therapeutic exercise    PT Next Visit Plan  Will reassess 3-4 weeks post op to determine needs    PT Home Exercise Plan  Post op shoulder ROM HEP    Consulted and Agree with Plan of Care  Patient;Family member/caregiver       Patient will benefit from skilled therapeutic intervention in order to improve the following deficits and impairments:  Decreased range of motion, Impaired UE functional use, Pain, Postural dysfunction, Decreased knowledge of precautions  Visit Diagnosis: Malignant neoplasm of lower-outer quadrant of right breast of female, estrogen receptor positive ([place]) - Plan: PT plan of care cert/re-cert  Abnormal posture - Plan: PT plan of care cert/re-cert  Patient will follow up at outpatient cancer rehab 3-4 weeks following surgery.  If the patient requires physical therapy at that time, a specific plan will be dictated and sent to the referring physician for approval. The patient was educated today on appropriate basic range of motion exercises to begin post operatively and the importance of attending the After Breast Cancer class following surgery.  Patient was educated today on lymphedema risk reduction practices as it pertains to recommendations that will benefit the patient immediately following surgery.  She verbalized good understanding.      Problem List Patient Active Problem List   Diagnosis Date Noted  . Malignant neoplasm of upper-outer quadrant of right breast in female, estrogen receptor positive ([place]) 01/21/2019   Annia Friendly, PT 01/27/19 4:32 PM  [place], [place], [address] Phone: [phone]   Fax:   [phone]  Name: CHEROLYN BEHRLE MRN: [id number] Date of Birth: Nov 01, 1966

## 2019-01-27 NOTE — Progress Notes (Signed)
Nutrition Assessment  Reason for Assessment:  Pt seen in Breast Clinic  ASSESSMENT:   53 year old female with new diagnosis of breast cancer.  Past medical history of GERD, esophageal dilation.  Patient reports normal appetite, weight gain with menopause.   Medications:  reviewed  Labs: reviewed  Anthropometrics:   Height: 63 inches Weight: 170 lb 9.6 oz BMI: 30  Weight gain recently   NUTRITION DIAGNOSIS: Food and nutrition related knowledge deficit related to new diagnosis of breast cancer as evidenced by no prior need for nutrition related information.  INTERVENTION:   Discussed and provided packet of information regarding nutritional tips for breast cancer patients.  Questions answered.  Teachback method used.  Contact information provided and patient knows to contact me with questions/concerns.    MONITORING, EVALUATION, and GOAL: Pt will consume a healthy plant based diet to maintain lean body mass throughout treatment.   Dennise Bamber B. Zenia Resides, [place], [place] Registered Dietitian [phone] (pager)

## 2019-01-28 ENCOUNTER — Telehealth: Payer: Self-pay | Admitting: Hematology

## 2019-01-28 ENCOUNTER — Other Ambulatory Visit: Payer: Self-pay | Admitting: General Surgery

## 2019-01-28 DIAGNOSIS — C50411 Malignant neoplasm of upper-outer quadrant of right female breast: Secondary | ICD-10-CM

## 2019-01-28 DIAGNOSIS — Z170 Estrogen receptor positive status [ER+]: Principal | ICD-10-CM

## 2019-01-28 NOTE — Telephone Encounter (Signed)
No los per 2/12.

## 2019-01-29 ENCOUNTER — Ambulatory Visit
Admission: RE | Admit: 2019-01-29 | Discharge: 2019-01-29 | Disposition: A | Discharge status: Home / Self Care | Payer: BLUE CROSS/BLUE SHIELD | Source: Ambulatory Visit | Attending: General Surgery | Admitting: General Surgery

## 2019-01-29 ENCOUNTER — Other Ambulatory Visit: Payer: BLUE CROSS/BLUE SHIELD

## 2019-01-29 DIAGNOSIS — Z170 Estrogen receptor positive status [ER+]: Principal | ICD-10-CM

## 2019-01-29 DIAGNOSIS — C50411 Malignant neoplasm of upper-outer quadrant of right female breast: Secondary | ICD-10-CM

## 2019-01-29 MED ORDER — GADOBUTROL 1 MMOL/ML IV SOLN
8.0000 mL | Freq: Once | INTRAVENOUS | Status: AC | PRN
  Administered 2019-01-29: 8 mL via INTRAVENOUS

## 2019-02-04 ENCOUNTER — Telehealth: Payer: Self-pay | Admitting: *Deleted

## 2019-02-04 NOTE — Telephone Encounter (Signed)
  Oncology Nurse Navigator Documentation  Navigator Location: [facility] (02/04/19 0900)   )Navigator Encounter Type: Telephone;[facility]C Follow-up (02/04/19 0900) Telephone: [name] Call;Clinic/MDC Follow-up (02/04/19 0900)                       Barriers/Navigation Needs: Education (02/04/19 0900) Education: Understanding Cancer/ Treatment Options;Coping with Diagnosis/ Prognosis;Preparing for Upcoming Surgery/ Treatment (02/04/19 0900)                        Time Spent with Patient: 30 (02/04/19 0900)

## 2019-02-05 ENCOUNTER — Ambulatory Visit: Payer: BLUE CROSS/BLUE SHIELD | Admitting: Plastic Surgery

## 2019-02-05 ENCOUNTER — Other Ambulatory Visit: Payer: BLUE CROSS/BLUE SHIELD

## 2019-02-05 ENCOUNTER — Encounter: Payer: Self-pay | Admitting: Plastic Surgery

## 2019-02-05 VITALS — BP 112/77 | HR 94 | Temp 98.3°F | Ht 63.0 in | Wt 165.0 lb

## 2019-02-05 DIAGNOSIS — C50411 Malignant neoplasm of upper-outer quadrant of right female breast: Secondary | ICD-10-CM

## 2019-02-05 DIAGNOSIS — Z170 Estrogen receptor positive status [ER+]: Secondary | ICD-10-CM

## 2019-02-05 NOTE — Progress Notes (Signed)
   Patient ID: [name], female    DOB: 12/03/1966, 52 y.o.   MRN: [id number]   Chief Complaint  Patient presents with  . Breast Cancer    The patient is a 52-year-old female here for consultation for breast reconstruction.  She was diagnosed with invasive lobular carcinoma of the upper outer quadrant right breast (estrogen positive, progesterone negative, HER-2 negative, Ki-67 at less than 1%).  She is here with her husband and 2 sisters.  This was diagnosed after a routine screening mammogram in January.  She then had an ultrasound and biopsy.  She has a past medical history of arthritis and gastroesophageal reflux disease.  She has undergone surgery for her shoulder knee and facial fractures.  Her family history is positive for brain cancer.  She does not use tobacco.  She is 5 feet 3 inches tall and weighs 165 pounds.  Due to her multifocal disease radiation is planned.  She is wanting to have bilateral mastectomies.   Review of Systems  Constitutional: Negative.  Negative for activity change and appetite change.  HENT: Negative.   Eyes: Negative.   Respiratory: Negative.   Cardiovascular: Negative.   Gastrointestinal: Negative.   Endocrine: Negative.   Genitourinary: Negative.   Musculoskeletal: Negative.   Skin: Negative.  Negative for color change and wound.  Neurological: Negative for speech difficulty.  Psychiatric/Behavioral: Negative.     Past Medical History:  Diagnosis Date  . Arthritis   . GERD (gastroesophageal reflux disease)     Past Surgical History:  Procedure Laterality Date  . FACIAL FRACTURE SURGERY    . KNEE SURGERY    . SHOULDER SURGERY        Current Outpatient Medications:  .  cyclobenzaprine (FLEXERIL) 10 MG tablet, Take 1 tablet (10 mg total) by mouth 2 (two) times daily as needed for muscle spasms., Disp: 20 tablet, Rfl: 0 .  FLUoxetine (PROZAC) 20 MG tablet, Take 20 mg by mouth daily., Disp: , Rfl:  .  ibuprofen (ADVIL,MOTRIN) 200 MG  tablet, Take 600 mg by mouth every 6 (six) hours as needed., Disp: , Rfl:  .  omeprazole (PRILOSEC) 20 MG capsule, Take 20 mg by mouth daily., Disp: , Rfl:    Objective:   Vitals:   02/05/19 1101  BP: 112/77  Pulse: 94  Temp: 98.3 F (36.8 C)  SpO2: 100%    Physical Exam Vitals signs and nursing note reviewed.  Constitutional:      Appearance: Normal appearance.  HENT:     Head: Normocephalic and atraumatic.     Mouth/Throat:     Mouth: Mucous membranes are moist.  Eyes:     Extraocular Movements: Extraocular movements intact.  Neck:     Musculoskeletal: Normal range of motion.  Cardiovascular:     Rate and Rhythm: Normal rate.     Pulses: Normal pulses.  Pulmonary:     Effort: Pulmonary effort is normal.  Abdominal:     General: Abdomen is flat. There is no distension.     Tenderness: There is no abdominal tenderness.  Skin:    General: Skin is warm.     Coloration: Skin is not jaundiced.  Neurological:     General: No focal deficit present.     Mental Status: She is alert.  Psychiatric:        Mood and Affect: Mood normal.        Behavior: Behavior normal.        Thought Content: Thought   content normal.        Judgment: Judgment normal.     Assessment & Plan:  Malignant neoplasm of upper-outer quadrant of right breast in female, estrogen receptor positive ([facility])  Assessment and Plan:  A long, detailed conversation was had regarding the patient's options for breast reconstruction. Five main points, which are explained to all breast reconstruction patients, were discussed.  1. Breast reconstruction is an optional process.  2. Breast reconstruction is a multi-stage process which involves multiple surgeries spaced several months apart. The entire process can take over one year.  3. The major goal of breast reconstruction is to have the patient look normal in clothing. When naked, there will always be scars.  4. Asymmetries are often present during the  reconstruction process. Several operations may be needed, including surgery to the non-cancerous breast, to achieve satisfactory results.  5. No matter the reconstructive method, there are ways that the reconstruction can fail and a secondary reconstructive plan would need to be created.   A general discussion regarding all available methods of breast reconstruction were discussed. The types of reconstructions described included.  1. Tissue expander and implant based reconstruction, both single and multi-stage approaches.  2. Autologous only reconstructions, including free abdominal-tissue based reconstructions.  3. Combination procedures, particularly latissismus dorsi flaps combined with either expanders or implants.  For each of the reconstruction methods mentioned above, the risks, benefits, alternatives, scarring, and recovery time were discussed in great detail. Specific risks detailed included bleeding, infection, hematoma, seroma, scarring, pain, wound healing complications, flap loss, fat necrosis, capsular contracture, need for implant removal, donor site complications, bulge, hernia, umbilical necrosis, need for urgent reoperation, and need for dressing changes were discussed.   Assessment  Once all reconstruction options were presented, a focused discussion was had regarding the patient's suitability for each of these procedures.  A total of 50 minutes of face-to-face time was spent in this encounter, of which >50% was spent in counseling.  After a lengthy discussion on options for breast reconstruction the patient and family have decided on implant-based reconstruction. She would like to have bilateral mastectomies with immediate reconstruction with expander and Flex HD placement. Due to the high likelihood of radiation I will use a larger piece of ADM.  This would be with hopes of expanding her and exchanging her prior to the radiation.  She understands that if we are not able to expand  completely and exchange prior to radiation she will need a latissimus muscle flap.  Claire S Dillingham, DO 

## 2019-02-09 ENCOUNTER — Ambulatory Visit: Payer: Self-pay | Admitting: General Surgery

## 2019-02-09 DIAGNOSIS — Z170 Estrogen receptor positive status [ER+]: Principal | ICD-10-CM

## 2019-02-09 DIAGNOSIS — C50411 Malignant neoplasm of upper-outer quadrant of right female breast: Secondary | ICD-10-CM

## 2019-02-17 NOTE — Pre-Procedure Instructions (Signed)
Naiomi P Gagan  02/17/2019      [id number] - [place], [place] - [address] [place] [address] Phone: [phone] Fax: [phone]    Your procedure is scheduled on March 11  Report to [facility] at 1000 A.M.  Call this number if you have problems the morning of surgery:  [phone]   Remember:  Do not eat after midnight.  You may drink clear liquids until 0900 am the morning of surgery .  Clear liquids allowed are:                    Water, Juice (non-citric and without pulp), Carbonated beverages, Clear Tea, Black Coffee only and Gatorade    Take these medicines the morning of surgery with A SIP OF WATER  acetaminophen (TYLENOL)  cyclobenzaprine (FLEXERIL) if needed docusate sodium (COLACE) FLUoxetine (PROZAC) omeprazole (PRILOSEC)  7 days prior to surgery STOP taking any Aspirin (unless otherwise instructed by your surgeon), Aleve, Naproxen, Ibuprofen, Motrin, Advil, Goody's, BC's, all herbal medications, fish oil, and all vitamins.     Do not wear jewelry, make-up or nail polish.  Do not wear lotions, powders, or perfumes, or deodorant.  Do not shave 48 hours prior to surgery.    Do not bring valuables to the hospital.  [facility] is not responsible for any belongings or valuables.  Contacts, dentures or bridgework may not be worn into surgery.  Leave your suitcase in the car.  After surgery it may be brought to your room.  For patients admitted to the hospital, discharge time will be determined by your treatment team.  Patients discharged the day of surgery will not be allowed to drive home.    Special instructions:   [facility]- Preparing For Surgery  Before surgery, you can play an important role. Because skin is not sterile, your skin needs to be as free of germs as possible. You can reduce the number of germs on your skin by washing with CHG (chlorahexidine  gluconate) Soap before surgery.  CHG is an antiseptic cleaner which kills germs and bonds with the skin to continue killing germs even after washing.    Oral Hygiene is also important to reduce your risk of infection.  Remember - BRUSH YOUR TEETH THE MORNING OF SURGERY WITH YOUR REGULAR TOOTHPASTE  Please do not use if you have an allergy to CHG or antibacterial soaps. If your skin becomes reddened/irritated stop using the CHG.  Do not shave (including legs and underarms) for at least 48 hours prior to first CHG shower. It is OK to shave your face.  Please follow these instructions carefully.   1. Shower the NIGHT BEFORE SURGERY and the MORNING OF SURGERY with CHG.   2. If you chose to wash your hair, wash your hair first as usual with your normal shampoo.  3. After you shampoo, rinse your hair and body thoroughly to remove the shampoo.  4. Use CHG as you would any other liquid soap. You can apply CHG directly to the skin and wash gently with a scrungie or a clean washcloth.   5. Apply the CHG Soap to your body ONLY FROM THE NECK DOWN.  Do not use on open wounds or open sores. Avoid contact with your eyes, ears, mouth and genitals (private parts). Wash Face and genitals (private parts)  with your normal soap.  6. Wash thoroughly,  paying special attention to the area where your surgery will be performed.  7. Thoroughly rinse your body with warm water from the neck down.  8. DO NOT shower/wash with your normal soap after using and rinsing off the CHG Soap.  9. Pat yourself dry with a CLEAN TOWEL.  10. Wear CLEAN PAJAMAS to bed the night before surgery, wear comfortable clothes the morning of surgery  11. Place CLEAN SHEETS on your bed the night of your first shower and DO NOT SLEEP WITH PETS.    Day of Surgery:  Do not apply any deodorants/lotions.  Please wear clean clothes to the hospital/surgery center.   Remember to brush your teeth WITH YOUR REGULAR TOOTHPASTE.    Please  read over the following fact sheets that you were given.

## 2019-02-18 ENCOUNTER — Encounter (HOSPITAL_COMMUNITY)
Admission: RE | Admit: 2019-02-18 | Discharge: 2019-02-18 | Disposition: A | Discharge status: Home / Self Care | Payer: BLUE CROSS/BLUE SHIELD | Source: Ambulatory Visit | Attending: General Surgery | Admitting: General Surgery

## 2019-02-18 ENCOUNTER — Other Ambulatory Visit: Payer: Self-pay

## 2019-02-18 ENCOUNTER — Encounter (HOSPITAL_COMMUNITY): Payer: Self-pay

## 2019-02-18 DIAGNOSIS — Z01818 Encounter for other preprocedural examination: Secondary | ICD-10-CM | POA: Insufficient documentation

## 2019-02-18 HISTORY — DX: Other specified postprocedural states: Z98.890

## 2019-02-18 HISTORY — DX: Family history of other specified conditions: Z84.89

## 2019-02-18 HISTORY — DX: Anemia, unspecified: D64.9

## 2019-02-18 HISTORY — DX: Malignant (primary) neoplasm, unspecified: C80.1

## 2019-02-18 HISTORY — DX: Nausea with vomiting, unspecified: R11.2

## 2019-02-18 HISTORY — DX: Other specified postprocedural states: R11.2

## 2019-02-18 LAB — CBC
HCT: 35.3 % — ABNORMAL LOW (ref 36.0–46.0)
Hemoglobin: 11.4 g/dL — ABNORMAL LOW (ref 12.0–15.0)
MCH: 28.9 pg (ref 26.0–34.0)
MCHC: 32.3 g/dL (ref 30.0–36.0)
MCV: 89.4 fL (ref 80.0–100.0)
Platelets: 308 10*3/uL (ref 150–400)
RBC: 3.95 MIL/uL (ref 3.87–5.11)
RDW: 12.2 % (ref 11.5–15.5)
WBC: 6.7 10*3/uL (ref 4.0–10.5)
nRBC: 0 % (ref 0.0–0.2)

## 2019-02-18 LAB — BASIC METABOLIC PANEL
Anion gap: 9 (ref 5–15)
BUN: 10 mg/dL (ref 6–20)
CO2: 26 mmol/L (ref 22–32)
Calcium: 10.1 mg/dL (ref 8.9–10.3)
Chloride: 103 mmol/L (ref 98–111)
Creatinine, Ser: 0.67 mg/dL (ref 0.44–1.00)
GFR calc Af Amer: >60 mL/min (ref >60)
GFR calc non Af Amer: >60 mL/min (ref >60)
Glucose, Bld: 92 mg/dL (ref 70–99)
Potassium: 4.9 mmol/L (ref 3.5–5.1)
Sodium: 138 mmol/L (ref 135–145)

## 2019-02-18 NOTE — Pre-Procedure Instructions (Signed)
[name]  02/18/2019      [id number] - [place], [place] - [address] [place] [address] Phone: [phone] Fax: [phone]    Your procedure is scheduled on, Wednesday,  March 11th   Report to [facility] Admitting at 10 A.M.             (posted surgery time 12noon - 5p)   Call this number if you have problems the morning of surgery:  [phone]   Remember:   Do not eat any foods after midnight, Tuesday.   You may drink clear liquids until 0900 am the morning of surgery .  Clear liquids allowed are:   Water, Juice (non-citric and without pulp), Carbonated beverages, Clear Tea, Black Coffee only and Gatorade    Take these medicines the morning of surgery with A SIP OF WATER  acetaminophen (TYLENOL)  cyclobenzaprine (FLEXERIL) if needed docusate sodium (COLACE) FLUoxetine (PROZAC) omeprazole (PRILOSEC)  7 days prior to surgery STOP taking any Aspirin (unless otherwise instructed by your surgeon), Aleve, Naproxen, Ibuprofen, Motrin, Advil, Goody's, BC's, all herbal medications, fish oil, and all vitamins.     Do not wear jewelry, make-up or nail polish.  Do not wear lotions, powders, perfumes, or deodorant.  Do not shave 48 hours prior to surgery.    Do not bring valuables to the hospital.  [facility] is not responsible for any belongings or valuables.  Contacts, dentures or bridgework may not be worn into surgery.  Leave your suitcase in the car.  After surgery it may be brought to your room.  For patients admitted to the hospital, discharge time will be determined by your treatment team.      Special instructions:   [facility]- Preparing For Surgery  Before surgery, you can play an important role. Because skin is not sterile, your skin needs to be as free of germs as possible. You can reduce the number of germs on your skin by washing with CHG (chlorahexidine  gluconate) Soap before surgery.  CHG is an antiseptic cleaner which kills germs and bonds with the skin to continue killing germs even after washing.    Oral Hygiene is also important to reduce your risk of infection.    Remember - BRUSH YOUR TEETH THE MORNING OF SURGERY WITH YOUR REGULAR TOOTHPASTE  Please do not use if you have an allergy to CHG or antibacterial soaps. If your skin becomes reddened/irritated stop using the CHG.  Do not shave (including legs and underarms) for at least 48 hours prior to first CHG shower. It is OK to shave your face.  Please follow these instructions carefully.   1. Shower the NIGHT BEFORE SURGERY and the MORNING OF SURGERY with CHG.   2. If you chose to wash your hair, wash your hair first as usual with your normal shampoo.  3. After you shampoo, rinse your hair and body thoroughly to remove the shampoo.  4. Use CHG as you would any other liquid soap. You can apply CHG directly to the skin and wash gently with a scrungie or a clean washcloth.   5. Apply the CHG Soap to your body ONLY FROM THE NECK DOWN.  Do not use on open wounds or open sores. Avoid contact with your eyes, ears, mouth and genitals (private parts). Wash Face and genitals (private parts)  with your normal soap.  6. Wash thoroughly, paying  special attention to the area where your surgery will be performed.  7. Thoroughly rinse your body with warm water from the neck down.  8. DO NOT shower/wash with your normal soap after using and rinsing off the CHG Soap.  9. Pat yourself dry with a CLEAN TOWEL.  10. Wear CLEAN PAJAMAS to bed the night before surgery, wear comfortable clothes the morning of surgery  11. Place CLEAN SHEETS on your bed the night of your first shower and DO NOT SLEEP WITH PETS.  Day of Surgery:  Do not apply any deodorants/lotions.  Please wear clean clothes to the hospital/surgery center.   Remember to brush your teeth WITH YOUR REGULAR TOOTHPASTE.    Please  read over the following fact sheets that you were given.

## 2019-02-18 NOTE — Progress Notes (Signed)
PCP -  Dr. Clyde Lundborg, [facility] [place]  LOV 11/2018 Oncologist is Dr. Truitt Merle   LOV  01/2019  Cardiologist - denies  Chest x-ray - 11/2018  EKG - 02/18/2019  Stress Test - denies ECHO - denies Cardiac Cath - denies  Sleep Study - denies CPAP - denies  Fasting Blood Sugar -   Checks Blood Sugar _____ times a day  Blood Thinner Instructions: Aspirin Instructions:  Anesthesia review:   She takes prozac for her hot flashes...  Having to call Dr. Elenor Quinones' office for orders.   To have reconstruction afterwards.  Patient denies shortness of breath, fever, cough and chest pain at PAT appointment  Patient verbalized understanding of instructions that were given to them at the PAT appointment. Patient was also instructed that they will need to review over the PAT instructions again at home before surgery.

## 2019-02-19 ENCOUNTER — Institutional Professional Consult (permissible substitution): Payer: BLUE CROSS/BLUE SHIELD | Admitting: Plastic Surgery

## 2019-02-19 ENCOUNTER — Encounter: Payer: Self-pay | Admitting: Plastic Surgery

## 2019-02-19 ENCOUNTER — Ambulatory Visit (INDEPENDENT_AMBULATORY_CARE_PROVIDER_SITE_OTHER): Payer: Self-pay | Admitting: Plastic Surgery

## 2019-02-19 VITALS — BP 128/83 | HR 83 | Temp 98.7°F | Ht 63.0 in | Wt 165.0 lb

## 2019-02-19 DIAGNOSIS — Z170 Estrogen receptor positive status [ER+]: Secondary | ICD-10-CM

## 2019-02-19 DIAGNOSIS — C50411 Malignant neoplasm of upper-outer quadrant of right female breast: Secondary | ICD-10-CM

## 2019-02-19 MED ORDER — HYDROCODONE-ACETAMINOPHEN 5-325 MG PO TABS: 1.0000 | ORAL_TABLET | Freq: Four times a day (QID) | ORAL | 0 refills | Status: DC | PRN

## 2019-02-19 MED ORDER — ONDANSETRON HCL 4 MG PO TABS: 4.0000 mg | ORAL_TABLET | Freq: Three times a day (TID) | ORAL | 0 refills | Status: DC | PRN

## 2019-02-19 MED ORDER — DIAZEPAM 2 MG PO TABS: 2.0000 mg | ORAL_TABLET | Freq: Two times a day (BID) | ORAL | 0 refills | Status: AC | PRN

## 2019-02-19 MED ORDER — CEPHALEXIN 500 MG PO CAPS: 500.0000 mg | ORAL_CAPSULE | Freq: Four times a day (QID) | ORAL | 0 refills | Status: DC

## 2019-02-19 NOTE — Progress Notes (Addendum)
Patient ID: [name], female    DOB: 12/21/1965, 53 y.o.   MRN: [id number]   Chief Complaint  Patient presents with  . Pre-op Exam    for (B) breast reconstruction with expanders and flexHD    The patient is a 53 year old female family for a history and physical for bilateral breast reconstruction.  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  It was estrogen positive, progesterone negative, HER-2 negative and Ki-67 less than 1%.  It was found after routine screening mammogram in January.  Past medical history is positive for gastroesophageal reflux disease.  She has had surgery on her knee and facial fractures.  She is 5 feet 3 inches tall and weighs 165 pounds.  Pre-operative bra size a 36 C.  She would like to be a B cup.  Patient is planned on the right side.  For that reason we will try to expand quickly.  She has not had any recent illnesses or been around anybody who is been sick lately.  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   Review of Systems  Constitutional: Negative.   HENT: Negative.   Eyes: Negative.   Respiratory: Negative.   Cardiovascular: Negative.   Gastrointestinal: Negative.   Endocrine: Negative.   Genitourinary: Negative.   Musculoskeletal: Negative.   Hematological: Negative.   Psychiatric/Behavioral: Negative.     Past Medical History:  Diagnosis Date  . Anemia   . Arthritis   . Cancer [facility])    right breast  . Family history of adverse reaction to anesthesia    sister has same issues  . GERD (gastroesophageal reflux disease)   . PONV (postoperative nausea and vomiting)     Past Surgical History:  Procedure Laterality Date  . BREAST SURGERY     left breast bx  . EYE SURGERY     dart to the eye  (right)  . FACIAL FRACTURE SURGERY    . FRACTURE SURGERY    . KNEE SURGERY    . SHOULDER SURGERY        Current Outpatient Medications:  .  acetaminophen (TYLENOL) 500 MG tablet, Take 1,000 mg by mouth every 6  (six) hours as needed for moderate pain., Disp: , Rfl:  .  Cholecalciferol (VITAMIN D3) 125 MCG (5000 UT) CAPS, Take 5,000 Units by mouth daily., Disp: , Rfl:  .  Cyanocobalamin (B-12) 2500 MCG TABS, Take 2,500 mcg by mouth daily., Disp: , Rfl:  .  cyclobenzaprine (FLEXERIL) 10 MG tablet, Take 1 tablet (10 mg total) by mouth 2 (two) times daily as needed for muscle spasms., Disp: 20 tablet, Rfl: 0 .  docusate sodium (COLACE) 100 MG capsule, Take 100 mg by mouth daily., Disp: , Rfl:  .  FLUoxetine (PROZAC) 10 MG capsule, Take 20 mg by mouth daily. , Disp: , Rfl:  .  ibuprofen (ADVIL,MOTRIN) 200 MG tablet, Take 600 mg by mouth every 6 (six) hours as needed for moderate pain. , Disp: , Rfl:  .  Iron-Vitamin C 65-125 MG TABS, Take 1 tablet by mouth daily., Disp: , Rfl:  .  Multiple Vitamin (MULTIVITAMIN WITH MINERALS) TABS tablet, Take 1 tablet by mouth daily., Disp: , Rfl:  .  omeprazole (PRILOSEC) 20 MG capsule, Take 20 mg by mouth daily., Disp: , Rfl:    Objective:   Vitals:   02/19/19 1115  BP: 128/83  Pulse: 83  Temp: 98.7 F (37.1 C)  SpO2: 100%    Physical  Exam Vitals signs and nursing note reviewed.  Constitutional:      Appearance: Normal appearance.  HENT:     Head: Normocephalic and atraumatic.     Nose: Nose normal.     Mouth/Throat:     Mouth: Mucous membranes are moist.  Eyes:     Extraocular Movements: Extraocular movements intact.  Cardiovascular:     Rate and Rhythm: Normal rate.     Pulses: Normal pulses.  Pulmonary:     Effort: Pulmonary effort is normal.  Abdominal:     General: Abdomen is flat. There is no distension.  Neurological:     General: No focal deficit present.     Mental Status: She is alert.  Psychiatric:        Mood and Affect: Mood normal.        Thought Content: Thought content normal.        Judgment: Judgment normal.     Assessment & Plan:  Malignant neoplasm of upper-outer quadrant of right breast in female, estrogen receptor  positive ([place])  Plan for bilateral immediate breast reconstruction with expander and FlexHD. The risks that can be encountered with and after placement of a breast expander placement were discussed and include the following but not limited to these: bleeding, infection, delayed healing, anesthesia risks, skin sensation changes, injury to structures including nerves, blood vessels, and muscles which may be temporary or permanent, allergies to tape, suture materials and glues, blood products, topical preparations or injected agents, skin contour irregularities, skin discoloration and swelling, deep vein thrombosis, cardiac and pulmonary complications, pain, which may persist, fluid accumulation, wrinkling of the skin over the expander, changes in nipple or breast sensation, expander leakage or rupture, faulty position of the expander, persistent pain, formation of tight scar tissue around the expander (capsular contracture), possible need for revisional surgery or staged procedures.  Prescriptions sent to pharmacy.   [place], DO

## 2019-02-19 NOTE — H&P (View-Only) (Signed)
Patient ID: [name], female    DOB: [birth year], 53 y.o.   MRN: [id number]   Chief Complaint  Patient presents with  . Pre-op Exam    for (B) breast reconstruction with expanders and flexHD    The patient is a 53 year old female family for history for bilateral breast reconstruction.  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  It was chin positive, progesterone negative, HER-2 negative and Ki-67 less than 1%.  Found after routine screening mammogram in January.  Tritus and gastroesophageal reflux disease.  She has had surgery on her, knee and facial fractures.  She has a only history of brain cancer.  She is 5 feet 3 inches tall and weighs 165 pounds.  Pre-operative bra size a 36 C.  She would like to be a B cup.  Patient is planned on the right side.  For that reason we will try to expand quickly.  She has not had any recent illnesses or been around anybody who is been sick lately.  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   Review of Systems  Constitutional: Negative.   HENT: Negative.   Eyes: Negative.   Respiratory: Negative.   Cardiovascular: Negative.   Gastrointestinal: Negative.   Endocrine: Negative.   Genitourinary: Negative.   Musculoskeletal: Negative.   Hematological: Negative.   Psychiatric/Behavioral: Negative.     Past Medical History:  Diagnosis Date  . Anemia   . Arthritis   . Cancer [facility])    right breast  . Family history of adverse reaction to anesthesia    sister has same issues  . GERD (gastroesophageal reflux disease)   . PONV (postoperative nausea and vomiting)     Past Surgical History:  Procedure Laterality Date  . BREAST SURGERY     left breast bx  . EYE SURGERY     dart to the eye  (right)  . FACIAL FRACTURE SURGERY    . FRACTURE SURGERY    . KNEE SURGERY    . SHOULDER SURGERY        Current Outpatient Medications:  .  acetaminophen (TYLENOL) 500 MG tablet, Take 1,000 mg by mouth every 6 (six) hours as needed  for moderate pain., Disp: , Rfl:  .  Cholecalciferol (VITAMIN D3) 125 MCG (5000 UT) CAPS, Take 5,000 Units by mouth daily., Disp: , Rfl:  .  Cyanocobalamin (B-12) 2500 MCG TABS, Take 2,500 mcg by mouth daily., Disp: , Rfl:  .  cyclobenzaprine (FLEXERIL) 10 MG tablet, Take 1 tablet (10 mg total) by mouth 2 (two) times daily as needed for muscle spasms., Disp: 20 tablet, Rfl: 0 .  docusate sodium (COLACE) 100 MG capsule, Take 100 mg by mouth daily., Disp: , Rfl:  .  FLUoxetine (PROZAC) 10 MG capsule, Take 20 mg by mouth daily. , Disp: , Rfl:  .  ibuprofen (ADVIL,MOTRIN) 200 MG tablet, Take 600 mg by mouth every 6 (six) hours as needed for moderate pain. , Disp: , Rfl:  .  Iron-Vitamin C 65-125 MG TABS, Take 1 tablet by mouth daily., Disp: , Rfl:  .  Multiple Vitamin (MULTIVITAMIN WITH MINERALS) TABS tablet, Take 1 tablet by mouth daily., Disp: , Rfl:  .  omeprazole (PRILOSEC) 20 MG capsule, Take 20 mg by mouth daily., Disp: , Rfl:    Objective:   Vitals:   02/19/19 1115  BP: 128/83  Pulse: 83  Temp: 98.7 F (37.1 C)  SpO2: 100%    Physical Exam Vitals  signs and nursing note reviewed.  Constitutional:      Appearance: Normal appearance.  HENT:     Head: Normocephalic and atraumatic.     Nose: Nose normal.     Mouth/Throat:     Mouth: Mucous membranes are moist.  Eyes:     Extraocular Movements: Extraocular movements intact.  Cardiovascular:     Rate and Rhythm: Normal rate.     Pulses: Normal pulses.  Pulmonary:     Effort: Pulmonary effort is normal.  Abdominal:     General: Abdomen is flat. There is no distension.  Neurological:     General: No focal deficit present.     Mental Status: She is alert.  Psychiatric:        Mood and Affect: Mood normal.        Thought Content: Thought content normal.        Judgment: Judgment normal.     Assessment & Plan:  Malignant neoplasm of upper-outer quadrant of right breast in female, estrogen receptor positive ([place])  Plan for  bilateral immediate breast reconstruction with expander and FlexHD. The risks that can be encountered with and after placement of a breast expander placement were discussed and include the following but not limited to these: bleeding, infection, delayed healing, anesthesia risks, skin sensation changes, injury to structures including nerves, blood vessels, and muscles which may be temporary or permanent, allergies to tape, suture materials and glues, blood products, topical preparations or injected agents, skin contour irregularities, skin discoloration and swelling, deep vein thrombosis, cardiac and pulmonary complications, pain, which may persist, fluid accumulation, wrinkling of the skin over the expander, changes in nipple or breast sensation, expander leakage or rupture, faulty position of the expander, persistent pain, formation of tight scar tissue around the expander (capsular contracture), possible need for revisional surgery or staged procedures.  Prescriptions sent to pharmacy.   [place], DO

## 2019-02-22 DIAGNOSIS — F32A Depression, unspecified: Secondary | ICD-10-CM | POA: Insufficient documentation

## 2019-02-23 ENCOUNTER — Ambulatory Visit (HOSPITAL_COMMUNITY): Payer: BLUE CROSS/BLUE SHIELD

## 2019-02-24 ENCOUNTER — Encounter (HOSPITAL_COMMUNITY): Payer: Self-pay

## 2019-02-24 ENCOUNTER — Encounter (HOSPITAL_COMMUNITY)
Admission: RE | Disposition: A | Discharge status: Home / Self Care | Payer: Self-pay | Source: Home / Self Care | Attending: Plastic Surgery

## 2019-02-24 ENCOUNTER — Ambulatory Visit (HOSPITAL_COMMUNITY): Payer: BLUE CROSS/BLUE SHIELD | Admitting: Certified Registered Nurse Anesthetist

## 2019-02-24 ENCOUNTER — Observation Stay (HOSPITAL_BASED_OUTPATIENT_CLINIC_OR_DEPARTMENT_OTHER)
Admission: RE | Admit: 2019-02-24 | Discharge: 2019-02-25 | Disposition: A | Discharge status: Home / Self Care | Payer: BLUE CROSS/BLUE SHIELD | Attending: Plastic Surgery | Admitting: Plastic Surgery

## 2019-02-24 ENCOUNTER — Ambulatory Visit (HOSPITAL_COMMUNITY)
Admission: RE | Admit: 2019-02-24 | Discharge: 2019-02-24 | Disposition: A | Discharge status: Home / Self Care | Payer: BLUE CROSS/BLUE SHIELD | Source: Ambulatory Visit | Attending: General Surgery | Admitting: General Surgery

## 2019-02-24 ENCOUNTER — Other Ambulatory Visit: Payer: Self-pay

## 2019-02-24 DIAGNOSIS — M1990 Unspecified osteoarthritis, unspecified site: Secondary | ICD-10-CM | POA: Insufficient documentation

## 2019-02-24 DIAGNOSIS — C50919 Malignant neoplasm of unspecified site of unspecified female breast: Secondary | ICD-10-CM | POA: Diagnosis present

## 2019-02-24 DIAGNOSIS — Z79899 Other long term (current) drug therapy: Secondary | ICD-10-CM | POA: Diagnosis not present

## 2019-02-24 DIAGNOSIS — C773 Secondary and unspecified malignant neoplasm of axilla and upper limb lymph nodes: Secondary | ICD-10-CM | POA: Diagnosis not present

## 2019-02-24 DIAGNOSIS — C50511 Malignant neoplasm of lower-outer quadrant of right female breast: Secondary | ICD-10-CM | POA: Diagnosis present

## 2019-02-24 DIAGNOSIS — C50411 Malignant neoplasm of upper-outer quadrant of right female breast: Secondary | ICD-10-CM | POA: Diagnosis present

## 2019-02-24 DIAGNOSIS — Z888 Allergy status to other drugs, medicaments and biological substances status: Secondary | ICD-10-CM | POA: Diagnosis not present

## 2019-02-24 DIAGNOSIS — Z791 Long term (current) use of non-steroidal anti-inflammatories (NSAID): Secondary | ICD-10-CM | POA: Diagnosis not present

## 2019-02-24 DIAGNOSIS — Z170 Estrogen receptor positive status [ER+]: Secondary | ICD-10-CM | POA: Insufficient documentation

## 2019-02-24 DIAGNOSIS — Z4001 Encounter for prophylactic removal of breast: Secondary | ICD-10-CM | POA: Insufficient documentation

## 2019-02-24 DIAGNOSIS — K219 Gastro-esophageal reflux disease without esophagitis: Secondary | ICD-10-CM | POA: Insufficient documentation

## 2019-02-24 DIAGNOSIS — Z885 Allergy status to narcotic agent status: Secondary | ICD-10-CM | POA: Insufficient documentation

## 2019-02-24 DIAGNOSIS — C50911 Malignant neoplasm of unspecified site of right female breast: Secondary | ICD-10-CM | POA: Diagnosis not present

## 2019-02-24 HISTORY — PX: BREAST RECONSTRUCTION WITH PLACEMENT OF TISSUE EXPANDER AND FLEX HD (ACELLULAR HYDRATED DERMIS): SHX6295

## 2019-02-24 HISTORY — PX: MASTECTOMY W/ SENTINEL NODE BIOPSY: SHX2001

## 2019-02-24 SURGERY — MASTECTOMY WITH SENTINEL LYMPH NODE BIOPSY
Anesthesia: General | Site: Breast | Laterality: Bilateral

## 2019-02-24 MED ORDER — CHLORHEXIDINE GLUCONATE CLOTH 2 % EX PADS: 6.0000 | MEDICATED_PAD | Freq: Once | CUTANEOUS | Status: DC

## 2019-02-24 MED ORDER — SENNA 8.6 MG PO TABS
1.0000 | ORAL_TABLET | Freq: Two times a day (BID) | ORAL | Status: DC
  Administered 2019-02-24 – 2019-02-25 (×2): 8.6 mg via ORAL
  Filled 2019-02-24 (×2): qty 1

## 2019-02-24 MED ORDER — FENTANYL CITRATE (PF) 100 MCG/2ML IJ SOLN
INTRAMUSCULAR | Status: AC
  Administered 2019-02-24: 100 ug via INTRAVENOUS
  Filled 2019-02-24: qty 2

## 2019-02-24 MED ORDER — DIPHENHYDRAMINE HCL 50 MG/ML IJ SOLN
INTRAMUSCULAR | Status: DC | PRN
  Administered 2019-02-24: 6.25 mg via INTRAVENOUS

## 2019-02-24 MED ORDER — POLYETHYLENE GLYCOL 3350 17 G PO PACK: 17.0000 g | PACK | Freq: Every day | ORAL | Status: DC | PRN

## 2019-02-24 MED ORDER — METHYLENE BLUE 0.5 % INJ SOLN
INTRAVENOUS | Status: AC
  Filled 2019-02-24: qty 10

## 2019-02-24 MED ORDER — HYDROMORPHONE HCL 1 MG/ML IJ SOLN
INTRAMUSCULAR | Status: DC | PRN
  Administered 2019-02-24 (×2): 0.5 mg via INTRAVENOUS

## 2019-02-24 MED ORDER — LIDOCAINE 2% (20 MG/ML) 5 ML SYRINGE
INTRAMUSCULAR | Status: DC | PRN
  Administered 2019-02-24: 60 mg via INTRAVENOUS

## 2019-02-24 MED ORDER — DIPHENHYDRAMINE HCL 12.5 MG/5ML PO ELIX: 12.5000 mg | ORAL_SOLUTION | Freq: Four times a day (QID) | ORAL | Status: DC | PRN

## 2019-02-24 MED ORDER — MIDAZOLAM HCL 2 MG/2ML IJ SOLN
INTRAMUSCULAR | Status: AC
  Filled 2019-02-24: qty 2

## 2019-02-24 MED ORDER — FENTANYL CITRATE (PF) 100 MCG/2ML IJ SOLN
100.0000 ug | Freq: Once | INTRAMUSCULAR | Status: AC
  Administered 2019-02-24: 100 ug via INTRAVENOUS

## 2019-02-24 MED ORDER — BUPIVACAINE HCL (PF) 0.25 % IJ SOLN
INTRAMUSCULAR | Status: AC
  Filled 2019-02-24: qty 30

## 2019-02-24 MED ORDER — BSS IO SOLN
15.0000 mL | Freq: Once | INTRAOCULAR | Status: AC
  Administered 2019-02-24: 15 mL
  Filled 2019-02-24: qty 15

## 2019-02-24 MED ORDER — GABAPENTIN 300 MG PO CAPS
ORAL_CAPSULE | ORAL | Status: AC
  Filled 2019-02-24: qty 1

## 2019-02-24 MED ORDER — PROPOFOL 10 MG/ML IV BOLUS
INTRAVENOUS | Status: DC | PRN
  Administered 2019-02-24: 150 mg via INTRAVENOUS

## 2019-02-24 MED ORDER — FENTANYL CITRATE (PF) 100 MCG/2ML IJ SOLN
25.0000 ug | INTRAMUSCULAR | Status: DC | PRN
  Administered 2019-02-24: 25 ug via INTRAVENOUS

## 2019-02-24 MED ORDER — SUGAMMADEX SODIUM 200 MG/2ML IV SOLN
INTRAVENOUS | Status: DC | PRN
  Administered 2019-02-24: 149.6 mg via INTRAVENOUS

## 2019-02-24 MED ORDER — HYDROMORPHONE HCL 1 MG/ML IJ SOLN
INTRAMUSCULAR | Status: AC
  Filled 2019-02-24: qty 0.5

## 2019-02-24 MED ORDER — MIDAZOLAM HCL 2 MG/2ML IJ SOLN
INTRAMUSCULAR | Status: AC
  Administered 2019-02-24: 2 mg via INTRAVENOUS
  Filled 2019-02-24: qty 2

## 2019-02-24 MED ORDER — HYDROCODONE-ACETAMINOPHEN 5-325 MG PO TABS
1.0000 | ORAL_TABLET | Freq: Four times a day (QID) | ORAL | Status: DC | PRN
  Administered 2019-02-24 – 2019-02-25 (×3): 1 via ORAL
  Filled 2019-02-24 (×3): qty 1

## 2019-02-24 MED ORDER — KETOROLAC TROMETHAMINE 0.5 % OP SOLN
OPHTHALMIC | Status: AC
  Filled 2019-02-24: qty 5

## 2019-02-24 MED ORDER — ROCURONIUM BROMIDE 50 MG/5ML IV SOSY
PREFILLED_SYRINGE | INTRAVENOUS | Status: DC | PRN
  Administered 2019-02-24: 50 mg via INTRAVENOUS

## 2019-02-24 MED ORDER — NAPROXEN 250 MG PO TABS: 500.0000 mg | ORAL_TABLET | Freq: Two times a day (BID) | ORAL | Status: DC | PRN

## 2019-02-24 MED ORDER — CEFAZOLIN SODIUM-DEXTROSE 2-4 GM/100ML-% IV SOLN
2.0000 g | INTRAVENOUS | Status: DC
  Filled 2019-02-24: qty 100

## 2019-02-24 MED ORDER — FENTANYL CITRATE (PF) 250 MCG/5ML IJ SOLN
INTRAMUSCULAR | Status: DC | PRN
  Administered 2019-02-24: 100 ug via INTRAVENOUS
  Administered 2019-02-24: 25 ug via INTRAVENOUS
  Administered 2019-02-24: 150 ug via INTRAVENOUS

## 2019-02-24 MED ORDER — CEFAZOLIN SODIUM-DEXTROSE 2-4 GM/100ML-% IV SOLN
2.0000 g | Freq: Three times a day (TID) | INTRAVENOUS | Status: DC
  Administered 2019-02-24 – 2019-02-25 (×2): 2 g via INTRAVENOUS
  Filled 2019-02-24 (×4): qty 100

## 2019-02-24 MED ORDER — SODIUM CHLORIDE 0.9 % IV SOLN
INTRAVENOUS | Status: DC | PRN
  Administered 2019-02-24: 25 ug/min via INTRAVENOUS

## 2019-02-24 MED ORDER — FENTANYL CITRATE (PF) 100 MCG/2ML IJ SOLN
INTRAMUSCULAR | Status: AC
  Filled 2019-02-24: qty 2

## 2019-02-24 MED ORDER — SODIUM CHLORIDE 0.9 % IV SOLN
INTRAVENOUS | Status: AC
  Filled 2019-02-24: qty 500000

## 2019-02-24 MED ORDER — PHENYLEPHRINE 40 MCG/ML (10ML) SYRINGE FOR IV PUSH (FOR BLOOD PRESSURE SUPPORT)
PREFILLED_SYRINGE | INTRAVENOUS | Status: DC | PRN
  Administered 2019-02-24: 80 ug via INTRAVENOUS
  Administered 2019-02-24: 120 ug via INTRAVENOUS
  Administered 2019-02-24: 160 ug via INTRAVENOUS
  Administered 2019-02-24: 40 ug via INTRAVENOUS

## 2019-02-24 MED ORDER — KETOROLAC TROMETHAMINE 0.5 % OP SOLN
1.0000 [drp] | Freq: Three times a day (TID) | OPHTHALMIC | Status: DC | PRN
  Administered 2019-02-24: 1 [drp] via OPHTHALMIC

## 2019-02-24 MED ORDER — FLUOXETINE HCL 20 MG PO CAPS
20.0000 mg | ORAL_CAPSULE | Freq: Every day | ORAL | Status: DC
  Administered 2019-02-24: 20 mg via ORAL
  Filled 2019-02-24 (×2): qty 1

## 2019-02-24 MED ORDER — LACTATED RINGERS IV SOLN
INTRAVENOUS | Status: DC
  Administered 2019-02-24 (×4): via INTRAVENOUS

## 2019-02-24 MED ORDER — KCL IN DEXTROSE-NACL 20-5-0.45 MEQ/L-%-% IV SOLN
INTRAVENOUS | Status: DC
  Administered 2019-02-25: 06:00:00 via INTRAVENOUS
  Filled 2019-02-24 (×2): qty 1000

## 2019-02-24 MED ORDER — CEFAZOLIN SODIUM-DEXTROSE 2-3 GM-%(50ML) IV SOLR
INTRAVENOUS | Status: DC | PRN
  Administered 2019-02-24: 2 g via INTRAVENOUS

## 2019-02-24 MED ORDER — CELECOXIB 200 MG PO CAPS
200.0000 mg | ORAL_CAPSULE | ORAL | Status: AC
  Administered 2019-02-24: 200 mg via ORAL
  Filled 2019-02-24: qty 1

## 2019-02-24 MED ORDER — ACETAMINOPHEN 325 MG PO TABS
325.0000 mg | ORAL_TABLET | Freq: Four times a day (QID) | ORAL | Status: DC
  Administered 2019-02-24 – 2019-02-25 (×2): 325 mg via ORAL
  Filled 2019-02-24 (×2): qty 1

## 2019-02-24 MED ORDER — DEXAMETHASONE SODIUM PHOSPHATE 10 MG/ML IJ SOLN
INTRAMUSCULAR | Status: DC | PRN
  Administered 2019-02-24: 5 mg via INTRAVENOUS

## 2019-02-24 MED ORDER — MORPHINE SULFATE (PF) 2 MG/ML IV SOLN
2.0000 mg | INTRAVENOUS | Status: DC | PRN
  Administered 2019-02-24 – 2019-02-25 (×3): 2 mg via INTRAVENOUS
  Filled 2019-02-24 (×3): qty 1

## 2019-02-24 MED ORDER — SODIUM CHLORIDE 0.9 % IV SOLN
INTRAVENOUS | Status: DC | PRN
  Administered 2019-02-24: 500 mL

## 2019-02-24 MED ORDER — 0.9 % SODIUM CHLORIDE (POUR BTL) OPTIME
TOPICAL | Status: DC | PRN
  Administered 2019-02-24 (×2): 1000 mL

## 2019-02-24 MED ORDER — FENTANYL CITRATE (PF) 250 MCG/5ML IJ SOLN
INTRAMUSCULAR | Status: AC
  Filled 2019-02-24: qty 5

## 2019-02-24 MED ORDER — EPHEDRINE SULFATE 50 MG/ML IJ SOLN: INTRAMUSCULAR | Status: DC | PRN

## 2019-02-24 MED ORDER — TECHNETIUM TC 99M SULFUR COLLOID FILTERED
1.0000 | Freq: Once | INTRAVENOUS | Status: AC | PRN
  Administered 2019-02-24: 1 via INTRADERMAL

## 2019-02-24 MED ORDER — BSS IO SOLN
INTRAOCULAR | Status: AC
  Filled 2019-02-24: qty 15

## 2019-02-24 MED ORDER — ACETAMINOPHEN 500 MG PO TABS
1000.0000 mg | ORAL_TABLET | ORAL | Status: DC
  Filled 2019-02-24: qty 2

## 2019-02-24 MED ORDER — ONDANSETRON 4 MG PO TBDP: 4.0000 mg | ORAL_TABLET | Freq: Four times a day (QID) | ORAL | Status: DC | PRN

## 2019-02-24 MED ORDER — ONDANSETRON HCL 4 MG/2ML IJ SOLN
4.0000 mg | Freq: Four times a day (QID) | INTRAMUSCULAR | Status: DC | PRN
  Administered 2019-02-24: 4 mg via INTRAVENOUS
  Filled 2019-02-24: qty 2

## 2019-02-24 MED ORDER — GABAPENTIN 300 MG PO CAPS
300.0000 mg | ORAL_CAPSULE | ORAL | Status: AC
  Administered 2019-02-24: 300 mg via ORAL
  Filled 2019-02-24: qty 1

## 2019-02-24 MED ORDER — DIAZEPAM 2 MG PO TABS
2.0000 mg | ORAL_TABLET | Freq: Two times a day (BID) | ORAL | Status: DC | PRN
  Administered 2019-02-24: 2 mg via ORAL
  Filled 2019-02-24: qty 1

## 2019-02-24 MED ORDER — METOCLOPRAMIDE HCL 5 MG/ML IJ SOLN
INTRAMUSCULAR | Status: DC | PRN
  Administered 2019-02-24 (×2): 5 mg via INTRAVENOUS

## 2019-02-24 MED ORDER — SCOPOLAMINE 1 MG/3DAYS TD PT72
1.0000 | MEDICATED_PATCH | TRANSDERMAL | Status: DC
  Administered 2019-02-24: 1.5 mg via TRANSDERMAL
  Filled 2019-02-24: qty 1

## 2019-02-24 MED ORDER — LABETALOL HCL 5 MG/ML IV SOLN
INTRAVENOUS | Status: DC | PRN
  Administered 2019-02-24 (×2): 5 mg via INTRAVENOUS

## 2019-02-24 MED ORDER — ONDANSETRON HCL 4 MG/2ML IJ SOLN
INTRAMUSCULAR | Status: DC | PRN
  Administered 2019-02-24: 4 mg via INTRAVENOUS

## 2019-02-24 MED ORDER — MIDAZOLAM HCL 2 MG/2ML IJ SOLN
2.0000 mg | Freq: Once | INTRAMUSCULAR | Status: AC
  Administered 2019-02-24: 2 mg via INTRAVENOUS

## 2019-02-24 MED ORDER — PROPOFOL 500 MG/50ML IV EMUL
INTRAVENOUS | Status: DC | PRN
  Administered 2019-02-24: 20 ug/kg/min via INTRAVENOUS

## 2019-02-24 MED ORDER — MIDAZOLAM HCL 2 MG/2ML IJ SOLN
INTRAMUSCULAR | Status: DC | PRN
  Administered 2019-02-24 (×2): 1 mg via INTRAVENOUS

## 2019-02-24 MED ORDER — ONDANSETRON HCL 4 MG/2ML IJ SOLN: 4.0000 mg | Freq: Four times a day (QID) | INTRAMUSCULAR | Status: DC | PRN

## 2019-02-24 MED ORDER — DIPHENHYDRAMINE HCL 50 MG/ML IJ SOLN: 12.5000 mg | Freq: Four times a day (QID) | INTRAMUSCULAR | Status: DC | PRN

## 2019-02-24 MED ORDER — BUPIVACAINE HCL (PF) 0.5 % IJ SOLN
INTRAMUSCULAR | Status: DC | PRN
  Administered 2019-02-24 (×2): 15 mL via PERINEURAL

## 2019-02-24 SURGICAL SUPPLY — 80 items
ADH SKN CLS APL DERMABOND .7 (GAUZE/BANDAGES/DRESSINGS) ×3
APPLIER CLIP 9.375 MED OPEN (MISCELLANEOUS) ×4
APR CLP MED 9.3 20 MLT OPN (MISCELLANEOUS) ×2
BAG DECANTER FOR FLEXI CONT (MISCELLANEOUS) ×2 IMPLANT
BINDER BREAST LRG (GAUZE/BANDAGES/DRESSINGS) IMPLANT
BINDER BREAST XLRG (GAUZE/BANDAGES/DRESSINGS) IMPLANT
BINDER BREAST XXLRG (GAUZE/BANDAGES/DRESSINGS) ×1 IMPLANT
BIOPATCH RED 1 DISK 7.0 (GAUZE/BANDAGES/DRESSINGS) ×4 IMPLANT
BLADE SURG 10 STRL SS (BLADE) ×2 IMPLANT
BLADE SURG 15 STRL LF DISP TIS (BLADE) ×1 IMPLANT
BLADE SURG 15 STRL SS (BLADE) ×2
CANISTER SUCT 1200ML W/VALVE (MISCELLANEOUS) ×2 IMPLANT
CANISTER SUCT 3000ML PPV (MISCELLANEOUS) ×2 IMPLANT
CHLORAPREP W/TINT 26ML (MISCELLANEOUS) ×2 IMPLANT
CLIP APPLIE 9.375 MED OPEN (MISCELLANEOUS) ×1 IMPLANT
COVER BACK TABLE 60X90IN (DRAPES) ×2 IMPLANT
COVER MAYO STAND STRL (DRAPES) ×2 IMPLANT
COVER PROBE W GEL 5X96 (DRAPES) ×2 IMPLANT
COVER SURGICAL LIGHT HANDLE (MISCELLANEOUS) ×2 IMPLANT
COVER WAND RF STERILE (DRAPES) ×2 IMPLANT
DERMABOND ADVANCED (GAUZE/BANDAGES/DRESSINGS) ×3
DERMABOND ADVANCED .7 DNX12 (GAUZE/BANDAGES/DRESSINGS) ×2 IMPLANT
DRAIN CHANNEL 19F RND (DRAIN) ×4 IMPLANT
DRAPE HALF SHEET 40X57 (DRAPES) ×2 IMPLANT
DRAPE LAPAROSCOPIC ABDOMINAL (DRAPES) ×2 IMPLANT
DRAPE ORTHO SPLIT 77X108 STRL (DRAPES) ×4
DRAPE SURG 17X23 STRL (DRAPES) ×8 IMPLANT
DRAPE SURG ORHT 6 SPLT 77X108 (DRAPES) ×2 IMPLANT
DRAPE UTILITY XL STRL (DRAPES) ×2 IMPLANT
DRAPE WARM FLUID 44X44 (DRAPE) ×2 IMPLANT
DRSG PAD ABDOMINAL 8X10 ST (GAUZE/BANDAGES/DRESSINGS) ×2 IMPLANT
ELECT BLADE 4.0 EZ CLEAN MEGAD (MISCELLANEOUS)
ELECT REM PT RETURN 9FT ADLT (ELECTROSURGICAL) ×2
ELECTRODE BLDE 4.0 EZ CLN MEGD (MISCELLANEOUS) IMPLANT
ELECTRODE REM PT RTRN 9FT ADLT (ELECTROSURGICAL) ×1 IMPLANT
EVACUATOR SILICONE 100CC (DRAIN) ×5 IMPLANT
GAUZE SPONGE 4X4 12PLY STRL (GAUZE/BANDAGES/DRESSINGS) ×2 IMPLANT
GAUZE SPONGE 4X4 12PLY STRL LF (GAUZE/BANDAGES/DRESSINGS) ×2 IMPLANT
GLOVE BIO SURGEON STRL SZ 6.5 (GLOVE) ×4 IMPLANT
GLOVE BIO SURGEON STRL SZ7.5 (GLOVE) ×2 IMPLANT
GOWN STRL REUS W/ TWL LRG LVL3 (GOWN DISPOSABLE) ×2 IMPLANT
GOWN STRL REUS W/TWL LRG LVL3 (GOWN DISPOSABLE) ×4
GRAFT FLEX HD 6X16 PLIABLE (Tissue) ×2 IMPLANT
IMPL EXPANDER BREAST 535CC (Breast) IMPLANT
IMPLANT BREAST 535CC (Breast) ×2 IMPLANT
IMPLANT EXPANDER BREAST 535CC (Breast) ×2 IMPLANT
KIT BASIN OR (CUSTOM PROCEDURE TRAY) ×2 IMPLANT
KIT FILL SYSTEM UNIVERSAL (SET/KITS/TRAYS/PACK) ×1 IMPLANT
KIT TURNOVER KIT B (KITS) ×2 IMPLANT
Magnetic Detection Device ×1 IMPLANT
NDL 21 GA WING INFUSION (NEEDLE) IMPLANT
NEEDLE 21 GA WING INFUSION (NEEDLE) ×2 IMPLANT
NS IRRIG 1000ML POUR BTL (IV SOLUTION) ×6 IMPLANT
PACK BASIN DAY SURGERY FS (CUSTOM PROCEDURE TRAY) ×2 IMPLANT
PACK GENERAL/GYN (CUSTOM PROCEDURE TRAY) ×2 IMPLANT
PAD ABD 8X10 STRL (GAUZE/BANDAGES/DRESSINGS) ×4 IMPLANT
PAD ARMBOARD 7.5X6 YLW CONV (MISCELLANEOUS) ×2 IMPLANT
PIN SAFETY STERILE (MISCELLANEOUS) ×2 IMPLANT
SET ASEPTIC TRANSFER (MISCELLANEOUS) IMPLANT
SLEEVE SCD COMPRESS KNEE MED (MISCELLANEOUS) ×2 IMPLANT
SLEEVE SUCTION 125 (MISCELLANEOUS) ×1 IMPLANT
SPONGE LAP 18X18 RF (DISPOSABLE) ×2 IMPLANT
SUT ETHILON 2 0 FS 18 (SUTURE) ×1 IMPLANT
SUT MNCRL AB 4-0 PS2 18 (SUTURE) ×11 IMPLANT
SUT MON AB 3-0 SH 27 (SUTURE) ×4
SUT MON AB 3-0 SH27 (SUTURE) ×2 IMPLANT
SUT MON AB 5-0 PS2 18 (SUTURE) ×4 IMPLANT
SUT PDS AB 2-0 CT1 27 (SUTURE) ×12 IMPLANT
SUT SILK 2 0 SH (SUTURE) ×2 IMPLANT
SUT SILK 4 0 PS 2 (SUTURE) ×3 IMPLANT
SUT VIC AB 3-0 SH 18 (SUTURE) IMPLANT
SUT VICRYL 3-0 CR8 SH (SUTURE) ×2 IMPLANT
SYR CONTROL 10ML LL (SYRINGE) ×1 IMPLANT
TOWEL GREEN STERILE FF (TOWEL DISPOSABLE) ×2 IMPLANT
TOWEL OR 17X24 6PK STRL BLUE (TOWEL DISPOSABLE) ×2 IMPLANT
TOWEL OR 17X26 10 PK STRL BLUE (TOWEL DISPOSABLE) ×2 IMPLANT
TRAY FOLEY MTR SLVR 14FR STAT (SET/KITS/TRAYS/PACK) IMPLANT
TRAY FOLEY W/BAG SLVR 14FR LF (SET/KITS/TRAYS/PACK) IMPLANT
TUBE CONNECTING 20X1/4 (TUBING) ×2 IMPLANT
YANKAUER SUCT BULB TIP NO VENT (SUCTIONS) ×2 IMPLANT

## 2019-02-24 NOTE — Anesthesia Procedure Notes (Signed)
Anesthesia Regional Block: Pectoralis block   Pre-Anesthetic Checklist: ,, timeout performed, Correct Patient, Correct Site, Correct Laterality, Correct Procedure, Correct Position, site marked, Risks and benefits discussed,  Surgical consent,  Pre-op evaluation,  At surgeon's request and post-op pain management  Laterality: Right  Prep: chloraprep       Needles:  Injection technique: Single-shot  Needle Type: Echogenic Needle     Needle Length: 9cm  Needle Gauge: 21     Additional Needles:   Narrative:  Start time: 02/24/2019 11:40 AM End time: 02/24/2019 11:48 AM Injection made incrementally with aspirations every 5 mL.  Performed by: Personally  Anesthesiologist: Albertha Ghee, MD  Additional Notes: Pt tolerated the procedure well.

## 2019-02-24 NOTE — Transfer of Care (Signed)
Immediate Anesthesia Transfer of Care Note  Patient: [name]  Procedure(s) Performed: RIGHT MASTECTOMY WITH SENTINEL LYMPH NODE MAPPING AND LEFT PROPHYLACTIC MASTECTOMY (Bilateral Breast) BREAST RECONSTRUCTION WITH PLACEMENT OF TISSUE EXPANDER AND FLEX HD (ACELLULAR HYDRATED DERMIS) (Bilateral Breast)  Patient Location: PACU  Anesthesia Type:General  Level of Consciousness: drowsy and patient cooperative  Airway & Oxygen Therapy: Patient Spontanous Breathing and Patient connected to face mask oxygen  Post-op Assessment: Report given to RN and Post -op Vital signs reviewed and stable  Post vital signs: Reviewed and stable  Last Vitals:  Vitals Value Taken Time  BP 142/88 02/24/2019  4:18 PM  Temp    Pulse 93 02/24/2019  4:19 PM  Resp 6 02/24/2019  4:19 PM  SpO2 100 % 02/24/2019  4:19 PM  Vitals shown include unvalidated device data.  Last Pain:  Vitals:   02/24/19 1040  TempSrc:   PainSc: 3       Patients Stated Pain Goal: 2 (88/41/66 0630)  Complications: No apparent anesthesia complications

## 2019-02-24 NOTE — Interval H&P Note (Signed)
History and Physical Interval Note:  02/24/2019 1:53 PM  [name]  has presented today for surgery, with the diagnosis of RIGHT BREAST CANCER.  The various methods of treatment have been discussed with the patient and family. After consideration of risks, benefits and other options for treatment, the patient has consented to  Procedure(s): RIGHT MASTECTOMY WITH SENTINEL LYMPH NODE MAPPING AND LEFT PROPHYLACTIC MASTECTOMY (Bilateral) BREAST RECONSTRUCTION WITH PLACEMENT OF TISSUE EXPANDER AND FLEX HD (ACELLULAR HYDRATED DERMIS) (Bilateral) as a surgical intervention.  The patient's history has been reviewed, patient examined, no change in status, stable for surgery.  I have reviewed the patient's chart and labs.  Questions were answered to the patient's satisfaction.     Loel Lofty Verlyn Lambert

## 2019-02-24 NOTE — Interval H&P Note (Signed)
History and Physical Interval Note:  02/24/2019 12:05 PM  [name]  has presented today for surgery, with the diagnosis of RIGHT BREAST CANCER.  The various methods of treatment have been discussed with the patient and family. After consideration of risks, benefits and other options for treatment, the patient has consented to  Procedure(s): BILATERAL MASTECTOMIES WITH RIGHT SENTINEL LYMPH NODE BIOPSY (Bilateral) BREAST RECONSTRUCTION WITH PLACEMENT OF TISSUE EXPANDER AND FLEX HD (ACELLULAR HYDRATED DERMIS) (Bilateral) as a surgical intervention.  The patient's history has been reviewed, patient examined, no change in status, stable for surgery.  I have reviewed the patient's chart and labs.  Questions were answered to the patient's satisfaction.     Autumn Messing III

## 2019-02-24 NOTE — Anesthesia Procedure Notes (Signed)
Anesthesia Regional Block: Pectoralis block   Pre-Anesthetic Checklist: ,, timeout performed, Correct Patient, Correct Site, Correct Laterality, Correct Procedure, Correct Position, site marked, Risks and benefits discussed,  Surgical consent,  Pre-op evaluation,  At surgeon's request and post-op pain management  Laterality: Left  Prep: chloraprep       Needles:  Injection technique: Single-shot  Needle Type: Echogenic Needle     Needle Length: 9cm  Needle Gauge: 21     Additional Needles:   Narrative:  Start time: 02/24/2019 11:32 AM End time: 02/24/2019 11:39 AM Injection made incrementally with aspirations every 5 mL.  Performed by: Personally  Anesthesiologist: Albertha Ghee, MD  Additional Notes: Pt tolerated the procedure well.

## 2019-02-24 NOTE — H&P (Signed)
[name]  Location: [facility] Patient #: [id number] DOB: 1966/01/02 Married / Language: English / Race: White Female   History of Present Illness  The patient is a 53 year old female who presents for a follow-up for Breast cancer. The patient is a 53 year old white female who was recently diagnosed with a grade 1 invasive lobular cancer of the outer aspect of the right breast. The cancer was ER positive and PR negative and HER-2 negative with Ki-67 less than 1%. Her recent MRI study showed the area of enhancement to be more along the lines of 6.6 cm with a couple of small nodules in other quadrants. Given this information she favors mastectomy. She also favors prophylactic mastectomy on the other side since she has had previous biopsies in the past and feels as though this tissue would be at risk. She has seen Dr. Marla Roe for reconstruction   Medication History  FLUoxetine HCl (10MG Capsule, Oral) Active. Omeprazole (20MG Capsule DR, Oral) Active. Ibuprofen (200MG Tablet, Oral) Active. Cyclobenzaprine HCl (10MG Tablet, Oral) Active. Multiple Vitamin (1 (one) Oral) Active. Medications Reconciled    Review of Systems General Not Present- Appetite Loss, Chills, Fatigue, Fever, Night Sweats, Weight Gain and Weight Loss. HEENT Not Present- Earache, Hearing Loss, Hoarseness, Nose Bleed, Oral Ulcers, Ringing in the Ears, Seasonal Allergies, Sinus Pain, Sore Throat, Visual Disturbances, Wears glasses/contact lenses and Yellow Eyes. Respiratory Not Present- Bloody sputum, Chronic Cough, Difficulty Breathing, Snoring and Wheezing. Breast Present- Breast Pain. Not Present- Breast Mass, Nipple Discharge and Skin Changes. Gastrointestinal Not Present- Abdominal Pain, Bloating, Bloody Stool, Change in Bowel Habits, Chronic diarrhea, Constipation, Difficulty Swallowing, Excessive gas, Gets full quickly at meals, Hemorrhoids, Indigestion, Nausea, Rectal Pain and  Vomiting. Female Genitourinary Not Present- Frequency, Nocturia, Painful Urination, Pelvic Pain and Urgency. Musculoskeletal Not Present- Back Pain, Joint Pain, Joint Stiffness, Muscle Pain, Muscle Weakness and Swelling of Extremities. Neurological Not Present- Decreased Memory, Fainting, Headaches, Numbness, Seizures, Tingling, Tremor, Trouble walking and Weakness. Endocrine Present- Hot flashes. Not Present- Cold Intolerance, Excessive Hunger, Hair Changes, Heat Intolerance and New Diabetes. Hematology Present- Easy Bruising. Not Present- Blood Thinners, Excessive bleeding, Gland problems, HIV and Persistent Infections.  Vitals  Weight: 169.38 lb Height: 64in Body Surface Area: 1.82 m Body Mass Index: 29.07 kg/m  Temp.: 57F  Pulse: 124 (Regular)  P.OX: 98% (Room air) BP: 108/76 (Sitting, Left Arm, Standard)       Physical Exam General Mental Status-Alert. General Appearance-Consistent with stated age. Hydration-Well hydrated. Voice-Normal.  Head and Neck Head-normocephalic, atraumatic with no lesions or palpable masses. Trachea-midline. Thyroid Gland Characteristics - normal size and consistency.  Eye Eyeball - Bilateral-Extraocular movements intact. Sclera/Conjunctiva - Bilateral-No scleral icterus.  Chest and Lung Exam Chest and lung exam reveals -quiet, even and easy respiratory effort with no use of accessory muscles and on auscultation, normal breath sounds, no adventitious sounds and normal vocal resonance. Inspection Chest Wall - Normal. Back - normal.  Breast Note: there is no palpable mass in either breast. there is no palpable axillary, supraclavicular, or cervical lymphadenopathy   Cardiovascular Cardiovascular examination reveals -normal heart sounds, regular rate and rhythm with no murmurs and normal pedal pulses bilaterally.  Abdomen Inspection Inspection of the abdomen reveals - No Hernias. Skin - Scar - no surgical  scars. Palpation/Percussion Palpation and Percussion of the abdomen reveal - Soft, Non Tender, No Rebound tenderness, No Rigidity (guarding) and No hepatosplenomegaly. Auscultation Auscultation of the abdomen reveals - Bowel sounds normal.  Neurologic Neurologic evaluation  reveals -alert and oriented x 3 with no impairment of recent or remote memory. Mental Status-Normal.  Musculoskeletal Normal Exam - Left-Upper Extremity Strength Normal and Lower Extremity Strength Normal. Normal Exam - Right-Upper Extremity Strength Normal and Lower Extremity Strength Normal.  Lymphatic Head & Neck  General Head & Neck Lymphatics: Bilateral - Description - Normal. Axillary  General Axillary Region: Bilateral - Description - Normal. Tenderness - Non Tender. Femoral & Inguinal  Generalized Femoral & Inguinal Lymphatics: Bilateral - Description - Normal. Tenderness - Non Tender.    Assessment & Plan MALIGNANT NEOPLASM OF LOWER-OUTER QUADRANT OF RIGHT BREAST OF FEMALE, ESTROGEN RECEPTOR POSITIVE (C50.511) Impression: The patient has a known lobular cancer in the outer aspect of the right breast. Her recent MRI suggested that the size of the area involved was closer to 6-1/2 cm. Because of this she favors bilateral mastectomies. She is also interested in reconstruction and has met with plastic surgery. I have discussed with her in detail the risks and benefits of the operation as well as some of the technical aspects and she understands and wishes to proceed. We will plan for right mastectomy sentinel node mapping and left prophylactic mastectomy in the next couple weeks

## 2019-02-24 NOTE — Anesthesia Procedure Notes (Signed)
Procedure Name: Intubation Date/Time: 02/24/2019 12:38 PM Performed by: Kathryne Hitch, CRNA Pre-anesthesia Checklist: Patient identified, Emergency Drugs available, Suction available, Timeout performed and Patient being monitored Patient Re-evaluated:Patient Re-evaluated prior to induction Oxygen Delivery Method: Circle system utilized Preoxygenation: Pre-oxygenation with 100% oxygen Induction Type: IV induction Ventilation: Mask ventilation without difficulty Laryngoscope Size: Miller and 2 Grade View: Grade II Tube type: Oral Tube size: 7.0 mm Number of attempts: 1 Airway Equipment and Method: Oral airway and Stylet Placement Confirmation: ETT inserted through vocal cords under direct vision,  positive ETCO2 and breath sounds checked- equal and bilateral Secured at: 22 cm Tube secured with: Tape Dental Injury: Teeth and Oropharynx as per pre-operative assessment

## 2019-02-24 NOTE — Op Note (Signed)
02/24/2019  3:18 PM  PATIENT:  [name]  53 y.o. female  PRE-OPERATIVE DIAGNOSIS:  RIGHT BREAST CANCER  POST-OPERATIVE DIAGNOSIS:  RIGHT BREAST CANCER  PROCEDURE:  Procedure(s): RIGHT MASTECTOMY WITH SENTINEL LYMPH NODE MAPPING AND LEFT PROPHYLACTIC MASTECTOMY   SURGEON:  Surgeon(s) and Role: Panel 1:    * Jovita Kussmaul, MD - Primary  PHYSICIAN ASSISTANT:   ASSISTANTS: none   ANESTHESIA:   general  EBL:  minimal   BLOOD ADMINISTERED:none  DRAINS: none   LOCAL MEDICATIONS USED:  NONE  SPECIMEN:  Source of Specimen:  right mastectomy and sentinel node X 2, left mastectomy  DISPOSITION OF SPECIMEN:  PATHOLOGY  COUNTS:  YES  TOURNIQUET:  * No tourniquets in log *  DICTATION: .Dragon Dictation   After informed consent was obtained the patient was brought to the operating room and placed in the supine position on the operating table.  After adequate induction of general anesthesia the patient's bilateral chest, breast, and axillary areas were prepped with ChloraPrep, allowed to dry, and draped in usual sterile manner.  An appropriate timeout was performed.  Earlier in the day the patient underwent injection of 1 mCi of technetium sulfur colloid in the subareolar position on the right.  Attention was first turned to the left breast.  An elliptical incision was made around the nipple and areola complex in a manner to spare the skin.  The incision was carried through the skin and subcutaneous tissue sharply with the electrocautery.  Breast hooks were used to elevate the skin flaps anteriorly towards the ceiling.  Thin skin flaps were then created circumferentially by dissection between the breast tissue and the subcutaneous fat.  This dissection was carried all the way to the chest wall.  Next the breast was removed from the pectoralis muscle with the pectoralis fascia.  Once this was accomplished the breast was removed from the patient and marked with a stitch on the lateral  skin.  Hemostasis was achieved using the Bovie electrocautery.  The wound was irrigated with saline and packed with a moistened lap sponge.  This side was then covered with a sterile green towel.  Attention was then turned to the right breast.  This was the side of the cancer.  The neoprobe was set to technetium in an area of radioactivity was readily identified in the right axilla.  Next an elliptical incision was made in a similar fashion around the nipple and areola complex in order to spare the skin.  The incision was carried through the skin and subcutaneous tissue sharply with the electrocautery.  Breast hooks were used to elevate the skin flaps anteriorly towards the ceiling.  Thin skin flaps were then created circumferentially by dissection between the breast tissue and the subcutaneous fat.  This dissection was carried all the way to the chest wall.  Next the breast was removed from the pectoralis muscle with the pectoralis fascia.  Once this was accomplished the breast was removed from the patient and marked with a stitch on the lateral skin.  Both breasts were then sent to pathology for further evaluation.  The neoprobe was used to identify 2 hot lymph nodes in the right axilla.  These were excised sharply with the electrocautery and the lymphatics and small vessels surrounding them were controlled with clips.  Ex vivo counts on these 2 nodes ranged from 300 to 3000.  No other hot or palpable lymph nodes were identified in the right axilla.  Hemostasis was achieved  using the Bovie electrocautery.  The wound was irrigated with saline and packed with a moistened lap sponge.  The patient tolerated the procedure well.  All needle sponge and instrument counts were correct.  At this point the operation was turned over to Dr. Marla Roe for the reconstruction.  Her portion will be dictated separately.  PLAN OF CARE: Admit for overnight observation  PATIENT DISPOSITION:  PACU - hemodynamically stable.    Delay start of Pharmacological VTE agent (>24hrs) due to surgical blood loss or risk of bleeding: no

## 2019-02-24 NOTE — Anesthesia Preprocedure Evaluation (Signed)
Anesthesia Evaluation  Patient identified by MRN, date of birth, ID band Patient awake    Reviewed: Allergy & Precautions, H&P , NPO status , Patient's Chart, lab work & pertinent test results  History of Anesthesia Complications (+) PONV and history of anesthetic complications  Airway Mallampati: II   Neck ROM: full    Dental   Pulmonary neg pulmonary ROS,    breath sounds clear to auscultation       Cardiovascular negative cardio ROS   Rhythm:regular Rate:Normal     Neuro/Psych    GI/Hepatic GERD  ,  Endo/Other    Renal/GU      Musculoskeletal  (+) Arthritis ,   Abdominal   Peds  Hematology   Anesthesia Other Findings   Reproductive/Obstetrics Breast CA                             Anesthesia Physical Anesthesia Plan  ASA: II  Anesthesia Plan: General   Post-op Pain Management:  Regional for Post-op pain   Induction: Intravenous  PONV Risk Score and Plan: 4 or greater and Ondansetron, Dexamethasone, Midazolam, Scopolamine patch - Pre-op and Treatment may vary due to age or medical condition  Airway Management Planned: Oral ETT  Additional Equipment:   Intra-op Plan:   Post-operative Plan: Extubation in OR  Informed Consent: I have reviewed the patients History and Physical, chart, labs and discussed the procedure including the risks, benefits and alternatives for the proposed anesthesia with the patient or authorized representative who has indicated his/her understanding and acceptance.       Plan Discussed with: CRNA, Anesthesiologist and Surgeon  Anesthesia Plan Comments:         Anesthesia Quick Evaluation

## 2019-02-24 NOTE — Discharge Instructions (Signed)
INSTRUCTIONS FOR AFTER BREAST SURGERY  You will likely have some questions about what to expect following your operation.  The following information will help you and your family understand what to expect when you are discharged from the hospital.  Following these guidelines will help ensure a smooth recovery and reduce risks of complications.   Postoperative instructions include information on: diet, wound care, medications and physical activity.  AFTER SURGERY We will plan for you to spend one night in the hospital for observation.  DIET Breast surgery does not require a specific diet.  However, I have to mention that the healthier you eat the better your body can start healing. It is important to increasing your protein intake.  This means limiting the foods with sugar and carbohydrates.  Focus on vegetables and some meat.  If you have any liposuction during your procedure be sure to drink water.  If your urine is bright yellow, then it is concentrated, and you need to drink more water.  As a general rule after surgery, you should have 8 ounces of water every hour while awake.  If you find you are persistently nauseated or unable to take in liquids let us know.  NO TOBACCO USE or EXPOSURE.  This will slow your healing process and increase the risk of a wound.  WOUND CARE If you have a drain clean with baby wipes until the drain is removed.  If you have steri-strips / tape directly attached to your skin leave them in place. It is OK to get these wet.  No baths, pools or hot tubs for two weeks.You can shower when the drains are removed.  Use fragrance free soap.  Dial, [place] and [place] are usually mild on the skin. We close your incision to leave the smallest and best-looking scar. No ointment or creams on your incisions until given the go ahead.  Especially not Neosporin (Too many skin reactions with this one).  A few weeks after surgery you can use Mederma and start massaging the scar. We ask you to  wear your binder or sports bra for the first 6 weeks around the clock, including while sleeping. This provides added comfort and helps reduce the fluid accumulation at the surgery site.  ACTIVITY No heavy lifting until cleared by the doctor.  This usually means no more than a half-gallon of milk.  It is OK to walk and climb stairs. In fact, moving your legs is very important to decrease your risk of a blood clot.  It will also help keep you from getting deconditioned.  Every 1 to 2 hours get up and walk for 5 minutes. This will help with a quicker recovery back to normal.  Let pain be your guide so you don't do too much.  NO, you cannot do the spring cleaning and don't plan on taking care of anyone else.  This is your time for TLC.  You will be more comfortable if you sleep and rest with your head elevated either with a few pillows under you or in a recliner.  No stomach sleeping for a few months.  WORK Everyone returns to work at different times. As a rough guide, most people take at least 1 - 2 weeks off prior to returning to work. If you need documentation for your job, bring the forms to your postoperative follow up visit.  DRIVING Arrange for someone to bring you home from the hospital.  You may be able to drive a few days after  surgery but not while taking any narcotics or valium.  BOWEL MOVEMENTS Constipation can occur after anesthesia and while taking pain medication.  It is important to stay ahead for your comfort.  We recommend taking Milk of Magnesia (2 tablespoons; twice a day) while taking the pain pills.  SEROMA This is fluid your body tried to put in the surgical site.  This is normal but if it creates tight skinny skin let us know.  It usually decreases in a few weeks.  WHEN TO CALL Call your surgeon's office if any of the following occur:  Fever 101 degrees F or greater  Excessive bleeding or fluid from the incision site.  Pain that increases over time without aid from the  medications  Redness, warmth, or pus draining from incision sites  Persistent nausea or inability to take in liquids  Severe misshapen area that underwent the operation.  Here are some resources:  1. Plastic surgery website: https://www.plasticsurgery.org/for-medical-professionals/education-and-resources/publications/breast-reconstruction-magazine 2. Breast Reconstruction Awareness Campaign:  HotelLives.co.nz 3. Plastic surgery Implant information:  https://www.plasticsurgery.org/patient-safety/breast-implant-safety

## 2019-02-24 NOTE — Op Note (Signed)
Op report    DATE OF OPERATION:  02/24/2019  LOCATION: [facility]  SURGICAL DIVISION: Plastic Surgery  PREOPERATIVE DIAGNOSES:  1. Breast cancer.    POSTOPERATIVE DIAGNOSES:  1. Breast cancer.   PROCEDURE:  1. Bilateral immediate breast reconstruction with placement of Acellular Dermal Matrix and tissue expanders.  SURGEON: Claire Sanger Dillingham, DO  ASSISTANT: [place], RNFA  ANESTHESIA:  General.   COMPLICATIONS: None.   IMPLANTS: Left - Mentor 535 cc. Ref #[id number].  Serial Number [id number], 200 cc of injectable saline placed in the expander. Right - Mentor 535 cc. Ref #[id number].  Serial Number [id number], 200 cc of injectable saline placed in the expander. Acellular Dermal Matrix - FlexHD 6 x 16 cm  INDICATIONS FOR PROCEDURE:  The patient, [name], is a 53 y.o. female born on 12/09/1966, is here for  immediate first stage breast reconstruction with placement of bilateral tissue expander and Acellular dermal matrix. MRN: [id number]  CONSENT:  Informed consent was obtained directly from the patient. Risks, benefits and alternatives were fully discussed. Specific risks including but not limited to bleeding, infection, hematoma, seroma, scarring, pain, implant infection, implant extrusion, capsular contracture, asymmetry, wound healing problems, and need for further surgery were all discussed. The patient did have an ample opportunity to have her questions answered to her satisfaction.   DESCRIPTION OF PROCEDURE:  The patient was taken to the operating room by the general surgery team. SCDs were placed and IV antibiotics were given. The patient's chest was prepped and draped in a sterile fashion. A time out was performed and the implants to be used were identified.  Bilateral mastectomies were performed.  Once the general surgery team had completed their portion of the case the patient was rendered to the plastic and reconstructive  surgery team.  Right:  The pectoralis major muscle was lifted from the chest wall with release of the lateral edge and lateral inframammary fold.  The pocket was irrigated with antibiotic solution and hemostasis was achieved with electrocautery.  The ADM was then prepared according to the manufacture guidelines and slits placed to help with postoperative fluid management.  The ADM was then sutured to the inferior and lateral edge of the inframammary fold with 2-0 PDS starting with an interrupted stitch and then a running stitch.  The lateral portion was sutured to with interrupted sutures after the expander was placed.  The expander was prepared according to the manufacture guidelines, the air evacuated and then it was placed under the ADM and pectoralis major muscle.  The inferior and lateral tabs were used to secure the expander to the chest wall with 2-0 PDS.  The drain was placed at the inframammary fold over the ADM and secured to the skin with 3-0 Silk.  The deep layers were closed with 3-0 Monocryl followed by 4-0 Monocryl.  The skin was closed with 5-0 Monocryl and then dermabond was applied.    Left: The pectoralis major muscle was lifted from the chest wall with release of the lateral edge and lateral inframammary fold.  The pocket was irrigated with antibiotic solution and hemostasis was achieved with electrocautery.  The ADM was then prepared according to the manufacture guidelines and slits placed to help with postoperative fluid management.  The ADM was then sutured to the inferior and lateral edge of the inframammary fold with 2-0 PDS starting with an interrupted stitch and then a running stitch.  The lateral portion was sutured to with interrupted sutures after  the expander was placed.  The expander was prepared according to the manufacture guidelines, the air evacuated and then it was placed under the ADM and pectoralis major muscle.  The inferior and lateral tabs were used to secure the  expander to the chest wall with 2-0 PDS.  The drain was placed at the inframammary fold over the ADM and secured to the skin with 3-0 Silk.    The deep layers were closed with 3-0 Monocryl followed by 4-0 Monocryl.  The skin was closed with 5-0 Monocryl and then dermabond was applied. The ABDs and breast binder were placed.  The patient tolerated the procedure well and there were no complications.  The patient was allowed to wake from anesthesia and taken to the recovery room in satisfactory condition.

## 2019-02-25 ENCOUNTER — Encounter (HOSPITAL_COMMUNITY): Payer: Self-pay | Admitting: General Surgery

## 2019-02-25 DIAGNOSIS — C50511 Malignant neoplasm of lower-outer quadrant of right female breast: Secondary | ICD-10-CM | POA: Diagnosis not present

## 2019-02-25 LAB — HIV ANTIBODY (ROUTINE TESTING W REFLEX): HIV Screen 4th Generation wRfx: NONREACTIVE

## 2019-02-25 MED ORDER — ONDANSETRON HCL 4 MG PO TABS: 4.0000 mg | ORAL_TABLET | Freq: Three times a day (TID) | ORAL | 0 refills | Status: AC | PRN

## 2019-02-25 MED ORDER — CEPHALEXIN 500 MG PO CAPS: 500.0000 mg | ORAL_CAPSULE | Freq: Four times a day (QID) | ORAL | 0 refills | Status: AC

## 2019-02-25 NOTE — Anesthesia Postprocedure Evaluation (Signed)
Anesthesia Post Note  Patient: [name]  Procedure(s) Performed: RIGHT MASTECTOMY WITH SENTINEL LYMPH NODE MAPPING AND LEFT PROPHYLACTIC MASTECTOMY (Bilateral Breast) BREAST RECONSTRUCTION WITH PLACEMENT OF TISSUE EXPANDER AND FLEX HD (ACELLULAR HYDRATED DERMIS) (Bilateral Breast)     Patient location during evaluation: PACU Anesthesia Type: General Level of consciousness: awake and alert Pain management: pain level controlled Vital Signs Assessment: post-procedure vital signs reviewed and stable Respiratory status: spontaneous breathing, nonlabored ventilation, respiratory function stable and patient connected to nasal cannula oxygen Cardiovascular status: blood pressure returned to baseline and stable Postop Assessment: no apparent nausea or vomiting Anesthetic complications: no    Last Vitals:  Vitals:   02/25/19 0609 02/25/19 1012  BP: (!) 130/59 (!) 112/46  Pulse: 71 98  Resp: 16 16  Temp: 37.1 C 37.2 C  SpO2: 98% 97%    Last Pain:  Vitals:   02/25/19 1012  TempSrc: Oral  PainSc:                  [place]

## 2019-02-25 NOTE — Discharge Planning (Signed)
Patient discharged home in stable condition. Verbalizes understanding of all discharge instructions, including home medications, JP drain care and follow up appointments.  ?

## 2019-02-26 ENCOUNTER — Telehealth: Payer: Self-pay | Admitting: Plastic Surgery

## 2019-02-26 NOTE — Telephone Encounter (Signed)
Patient's husband called with questions concerning patient's surgery on Wednesday. Inquiring about wound changes, etc. Please contact patient and advise

## 2019-02-26 NOTE — Telephone Encounter (Signed)
Called and spoke with the patient regarding the message below.  Patient stated that when she reaches for something or wipes herself she feels sharp pains in her chest.  She wanted to know if it's normal to have sharp pains in her chest.  Informed the patient that I have spoken to [place], [place] and she stated that the sharp pain she is feeling when she reaches is normal.   Informed the patient she should not do any reaching over her head, behind her back, or reaching up.  When she wipes herself she should try using a wet wipe and wipe from the front to the back.  Patient verbalized understanding and agreed.//AB/CMA

## 2019-03-01 ENCOUNTER — Telehealth: Payer: Self-pay | Admitting: Plastic Surgery

## 2019-03-01 NOTE — Telephone Encounter (Signed)
Patient called again requesting call from clinical staff. Have made follow up appt for patient to be seen tomorrow due to sharp chest pain as well as warmth/redness around incision site.

## 2019-03-01 NOTE — Telephone Encounter (Signed)
Patient has left three voicemails over the weekend on the answering service with different concerns. Patient would like to know when to take off bandages that were placed during surgery. Patient is also complaining of pain on her right side that is not controlled by what has been prescribed. Patient also states that she is having some irritation around her drain with some redness and warmth. Please contact patient and advise.

## 2019-03-02 ENCOUNTER — Other Ambulatory Visit: Payer: Self-pay

## 2019-03-02 ENCOUNTER — Ambulatory Visit (INDEPENDENT_AMBULATORY_CARE_PROVIDER_SITE_OTHER): Payer: BLUE CROSS/BLUE SHIELD | Admitting: Plastic Surgery

## 2019-03-02 ENCOUNTER — Encounter: Payer: Self-pay | Admitting: Plastic Surgery

## 2019-03-02 VITALS — BP 116/82 | HR 89 | Temp 98.3°F | Ht 63.0 in | Wt <= 1120 oz

## 2019-03-02 DIAGNOSIS — Z9013 Acquired absence of bilateral breasts and nipples: Secondary | ICD-10-CM

## 2019-03-02 DIAGNOSIS — C50411 Malignant neoplasm of upper-outer quadrant of right female breast: Secondary | ICD-10-CM

## 2019-03-02 DIAGNOSIS — Z170 Estrogen receptor positive status [ER+]: Secondary | ICD-10-CM

## 2019-03-02 NOTE — Progress Notes (Signed)
   Subjective:    Patient ID: [name], female    DOB: 05-Jul-1966, 53 y.o.   MRN: [id number]  The patient is a 53 year old female here for postop follow-up after bilateral breast reconstruction.  The drains are in place and appear to be working.  Her drain output is as expected.  She had a little bit of fluid collection that I was able to work out of the drain.  No sign of infection . She has a little bit of bruising but her flaps appear to be alive and healthy.  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  She is 5 feet 3 inches tall and weighs 165 pounds.  Her preoperative bra size was 36 C.  Incisions are healing well.     Review of Systems  Constitutional: Negative.  Negative for activity change and appetite change.  HENT: Negative.   Respiratory: Negative.   Cardiovascular: Negative.   Musculoskeletal: Negative.   Psychiatric/Behavioral: Negative.        Objective:   Physical Exam Vitals signs and nursing note reviewed.  Constitutional:      Appearance: Normal appearance.  HENT:     Head: Normocephalic and atraumatic.  Cardiovascular:     Rate and Rhythm: Normal rate.  Pulmonary:     Effort: Pulmonary effort is normal.  Skin:    General: Skin is warm.  Neurological:     General: No focal deficit present.     Mental Status: She is alert.  Psychiatric:        Mood and Affect: Mood normal.        Behavior: Behavior normal.        Thought Content: Thought content normal.        Assessment & Plan:  Malignant neoplasm of upper-outer quadrant of right breast in female, estrogen receptor positive ([place])  Acquired absence of bilateral breasts and nipples  We placed injectable saline in the Expander using a sterile technique: Right: 50 cc for a total of 250 / 535 cc Left: 50 cc for a total of 250 / 535 cc  Follow-up in 1 week.  Plan to remove drain next week. Vaseline around the drain site and skin as needed.

## 2019-03-03 NOTE — Telephone Encounter (Signed)
Called and spoke with the patient on Mon (03/01/19) regarding the note below.   Informed the patient that I spoke with Dr. Marla Roe and she said she could go ahead and take the dressing off.  She said the dressing is there for comfort.  She can use vaseline around the drain site, and the redness is normal.  Asked the patient what was she taking for the pain, and she stated Hydrocodone,Motrin,Tylenol, and a muscle relaxer.  Asked her was the meds she's taking not helping and she stated that she can't tell because when she takes the Hydrocodone and the muscle relaxer it puts her out.//AB/CMA

## 2019-03-03 NOTE — Discharge Summary (Signed)
Physician Discharge Summary  Patient ID: [name] MRN: [id number] DOB/AGE: 01/29/1966 53 y.o.  Admit date: 02/24/2019 Discharge date: 03/03/2019  Admission Diagnoses:  Discharge Diagnoses:  Active Problems:   Breast cancer [facility])   Discharged Condition: good  Hospital Course: The patient is a 53 yrs old female who was taken to the OR for bilateral mastectomies and immediate reconstruction.  She did well throughout the case and monitored on the surgery unit after the case.  She was tolerating food well, voiding without difficulty and walking.  She was ready for discharge home. She will follow up in the clinic in 1 week.  Consults: None  Significant Diagnostic Studies: none  Treatments: surgery  Discharge Exam: Blood pressure (!) 112/46, pulse 98, temperature 98.9 F (37.2 C), temperature source Oral, resp. rate 16, height 5\' 3"  (1.6 m), weight 74.8 kg, SpO2 97 %. General appearance: alert, cooperative and no distress Incision/Wound:  Disposition:   Discharge Instructions    Call MD for:  difficulty breathing, headache or visual disturbances   Complete by:  As directed    Call MD for:  hives   Complete by:  As directed    Call MD for:  persistant dizziness or light-headedness   Complete by:  As directed    Call MD for:  persistant nausea and vomiting   Complete by:  As directed    Call MD for:  redness, tenderness, or signs of infection (pain, swelling, redness, odor or green/yellow discharge around incision site)   Complete by:  As directed    Call MD for:  severe uncontrolled pain   Complete by:  As directed    Call MD for:  temperature >100.4   Complete by:  As directed    Diet general   Complete by:  As directed    Discharge wound care:   Complete by:  As directed    Drain Care   Driving Restrictions   Complete by:  As directed    Can drive when not taking pain meds for 48 hrs   Increase activity slowly   Complete by:  As directed    Lifting restrictions    Complete by:  As directed    No heavy lifting, no more than 5 pounds.     Allergies as of 02/25/2019      Reactions   Codeine Nausea And Vomiting   Tramadol Nausea And Vomiting      Medication List    STOP taking these medications   cephALEXin 500 MG capsule Commonly known as:  KEFLEX   HYDROcodone-acetaminophen 5-325 MG tablet Commonly known as:  Norco   ondansetron 4 MG tablet Commonly known as:  Zofran     TAKE these medications   acetaminophen 500 MG tablet Commonly known as:  TYLENOL Take 1,000 mg by mouth every 6 (six) hours as needed for moderate pain.   B-12 2500 MCG Tabs Take 2,500 mcg by mouth daily.   cyclobenzaprine 10 MG tablet Commonly known as:  FLEXERIL Take 1 tablet (10 mg total) by mouth 2 (two) times daily as needed for muscle spasms.   docusate sodium 100 MG capsule Commonly known as:  COLACE Take 100 mg by mouth daily.   FLUoxetine 10 MG capsule Commonly known as:  PROZAC Take 20 mg by mouth daily.   ibuprofen 200 MG tablet Commonly known as:  ADVIL,MOTRIN Take 600 mg by mouth every 6 (six) hours as needed for moderate pain.   Iron-Vitamin C 65-125 MG Tabs  Take 1 tablet by mouth daily.   multivitamin with minerals Tabs tablet Take 1 tablet by mouth daily.   omeprazole 20 MG capsule Commonly known as:  PRILOSEC Take 20 mg by mouth daily.   Vitamin D3 125 MCG (5000 UT) Caps Take 5,000 Units by mouth daily.     ASK your doctor about these medications   cephALEXin 500 MG capsule Commonly known as:  KEFLEX Take 1 capsule (500 mg total) by mouth 4 (four) times daily for 5 days. Ask about: Should I take this medication?   diazepam 2 MG tablet Commonly known as:  Valium Take 1 tablet (2 mg total) by mouth every 12 (twelve) hours as needed for up to 10 days for anxiety or muscle spasms. Ask about: Should I take this medication?   ondansetron 4 MG tablet Commonly known as:  Zofran Take 1 tablet (4 mg total) by mouth every 8  (eight) hours as needed for up to 5 days for nausea or vomiting. Ask about: Should I take this medication?            Discharge Care Instructions  (From admission, onward)         Start     Ordered   02/25/19 0000  Discharge wound care:    Comments:  Drain Care   02/25/19 0700         Follow-up Information    Taisia Fantini, Loel Lofty, DO In 1 week.   Specialty:  Plastic Surgery Contact information: [address] [place] [address] [phone]           Signed: Wallace Going 03/03/2019, 9:48 AM

## 2019-03-04 ENCOUNTER — Other Ambulatory Visit: Payer: Self-pay

## 2019-03-04 ENCOUNTER — Telehealth: Payer: Self-pay | Admitting: *Deleted

## 2019-03-04 ENCOUNTER — Emergency Department (HOSPITAL_COMMUNITY)
Admission: EM | Admit: 2019-03-04 | Discharge: 2019-03-05 | Disposition: A | Discharge status: Home / Self Care | Payer: BLUE CROSS/BLUE SHIELD | Attending: Emergency Medicine | Admitting: Emergency Medicine

## 2019-03-04 DIAGNOSIS — Z79899 Other long term (current) drug therapy: Secondary | ICD-10-CM | POA: Diagnosis not present

## 2019-03-04 DIAGNOSIS — Y658 Other specified misadventures during surgical and medical care: Secondary | ICD-10-CM | POA: Diagnosis not present

## 2019-03-04 DIAGNOSIS — T889XXA Complication of surgical and medical care, unspecified, initial encounter: Secondary | ICD-10-CM | POA: Diagnosis present

## 2019-03-04 DIAGNOSIS — G8918 Other acute postprocedural pain: Secondary | ICD-10-CM | POA: Diagnosis not present

## 2019-03-04 DIAGNOSIS — N644 Mastodynia: Secondary | ICD-10-CM | POA: Insufficient documentation

## 2019-03-04 MED ORDER — HYDROCODONE-ACETAMINOPHEN 10-325 MG PO TABS: 1.0000 | ORAL_TABLET | Freq: Three times a day (TID) | ORAL | 0 refills | Status: AC | PRN

## 2019-03-04 NOTE — Addendum Note (Signed)
Addended by: Wallace Going on: 03/04/2019 02:35 PM   Modules accepted: Orders

## 2019-03-04 NOTE — Telephone Encounter (Signed)
Received order for Mammaprint Testing on Right LN. Requisition faxed to pathology and [facility]. Received by [employer]

## 2019-03-05 ENCOUNTER — Emergency Department (HOSPITAL_COMMUNITY): Payer: BLUE CROSS/BLUE SHIELD

## 2019-03-05 ENCOUNTER — Encounter: Payer: Self-pay | Admitting: Plastic Surgery

## 2019-03-05 ENCOUNTER — Encounter: Payer: BLUE CROSS/BLUE SHIELD | Admitting: Plastic Surgery

## 2019-03-05 ENCOUNTER — Encounter (HOSPITAL_COMMUNITY): Payer: Self-pay | Admitting: Emergency Medicine

## 2019-03-05 ENCOUNTER — Other Ambulatory Visit: Payer: Self-pay

## 2019-03-05 ENCOUNTER — Ambulatory Visit (INDEPENDENT_AMBULATORY_CARE_PROVIDER_SITE_OTHER): Payer: BLUE CROSS/BLUE SHIELD | Admitting: Plastic Surgery

## 2019-03-05 VITALS — BP 112/77 | HR 93 | Temp 98.9°F | Ht 63.0 in | Wt 165.0 lb

## 2019-03-05 DIAGNOSIS — Z9013 Acquired absence of bilateral breasts and nipples: Secondary | ICD-10-CM

## 2019-03-05 LAB — URINALYSIS, ROUTINE W REFLEX MICROSCOPIC
Bilirubin Urine: NEGATIVE
Glucose, UA: NEGATIVE mg/dL
Hgb urine dipstick: NEGATIVE
Ketones, ur: NEGATIVE mg/dL
Leukocytes,Ua: NEGATIVE
NITRITE: NEGATIVE
Protein, ur: NEGATIVE mg/dL
Specific Gravity, Urine: 1.005 (ref 1.005–1.030)
pH: 7 (ref 5.0–8.0)

## 2019-03-05 LAB — CBC WITH DIFFERENTIAL/PLATELET
Abs Immature Granulocytes: 0.06 10*3/uL (ref 0.00–0.07)
BASOS ABS: 0.1 10*3/uL (ref 0.0–0.1)
Basophils Relative: 1 %
Eosinophils Absolute: 0.2 10*3/uL (ref 0.0–0.5)
Eosinophils Relative: 2 %
HCT: 34.7 % — ABNORMAL LOW (ref 36.0–46.0)
Hemoglobin: 11 g/dL — ABNORMAL LOW (ref 12.0–15.0)
Immature Granulocytes: 0 %
Lymphocytes Relative: 10 %
Lymphs Abs: 1.5 10*3/uL (ref 0.7–4.0)
MCH: 28.2 pg (ref 26.0–34.0)
MCHC: 31.7 g/dL (ref 30.0–36.0)
MCV: 89 fL (ref 80.0–100.0)
Monocytes Absolute: 1 10*3/uL (ref 0.1–1.0)
Monocytes Relative: 7 %
NEUTROS PCT: 80 %
Neutro Abs: 12.2 10*3/uL — ABNORMAL HIGH (ref 1.7–7.7)
Platelets: 384 10*3/uL (ref 150–400)
RBC: 3.9 MIL/uL (ref 3.87–5.11)
RDW: 12.7 % (ref 11.5–15.5)
WBC: 15.1 10*3/uL — AB (ref 4.0–10.5)
nRBC: 0 % (ref 0.0–0.2)

## 2019-03-05 LAB — COMPREHENSIVE METABOLIC PANEL
ALT: 26 U/L (ref 0–44)
AST: 29 U/L (ref 15–41)
Albumin: 3.6 g/dL (ref 3.5–5.0)
Alkaline Phosphatase: 75 U/L (ref 38–126)
Anion gap: 9 (ref 5–15)
BUN: 9 mg/dL (ref 6–20)
CO2: 26 mmol/L (ref 22–32)
Calcium: 9.6 mg/dL (ref 8.9–10.3)
Chloride: 104 mmol/L (ref 98–111)
Creatinine, Ser: 0.62 mg/dL (ref 0.44–1.00)
GFR calc non Af Amer: >60 mL/min (ref >60)
Glucose, Bld: 113 mg/dL — ABNORMAL HIGH (ref 70–99)
Potassium: 3.6 mmol/L (ref 3.5–5.1)
Sodium: 139 mmol/L (ref 135–145)
Total Bilirubin: 0.4 mg/dL (ref 0.3–1.2)
Total Protein: 6.8 g/dL (ref 6.5–8.1)

## 2019-03-05 LAB — LACTIC ACID, PLASMA: Lactic Acid, Venous: 1.3 mmol/L (ref 0.5–1.9)

## 2019-03-05 MED ORDER — KETOROLAC TROMETHAMINE 15 MG/ML IJ SOLN
15.0000 mg | Freq: Once | INTRAMUSCULAR | Status: AC
  Administered 2019-03-05: 15 mg via INTRAVENOUS
  Filled 2019-03-05: qty 1

## 2019-03-05 MED ORDER — FENTANYL CITRATE (PF) 100 MCG/2ML IJ SOLN
50.0000 ug | Freq: Once | INTRAMUSCULAR | Status: AC
  Administered 2019-03-05: 50 ug via INTRAVENOUS
  Filled 2019-03-05: qty 2

## 2019-03-05 MED ORDER — ONDANSETRON HCL 4 MG/2ML IJ SOLN
INTRAMUSCULAR | Status: AC
  Filled 2019-03-05: qty 2

## 2019-03-05 MED ORDER — SODIUM CHLORIDE 0.9% FLUSH: 3.0000 mL | Freq: Once | INTRAVENOUS | Status: DC

## 2019-03-05 MED ORDER — IOHEXOL 350 MG/ML SOLN
75.0000 mL | Freq: Once | INTRAVENOUS | Status: AC | PRN
  Administered 2019-03-05: 75 mL via INTRAVENOUS

## 2019-03-05 MED ORDER — OXYCODONE-ACETAMINOPHEN 5-325 MG PO TABS
1.0000 | ORAL_TABLET | ORAL | Status: AC
  Administered 2019-03-05: 1 via ORAL
  Filled 2019-03-05: qty 1

## 2019-03-05 MED ORDER — DICLOFENAC SODIUM ER 100 MG PO TB24: 100.0000 mg | ORAL_TABLET | Freq: Every day | ORAL | 0 refills | Status: DC

## 2019-03-05 MED ORDER — ONDANSETRON HCL 4 MG/2ML IJ SOLN
4.0000 mg | Freq: Once | INTRAMUSCULAR | Status: AC
  Administered 2019-03-05: 4 mg via INTRAVENOUS

## 2019-03-05 NOTE — ED Triage Notes (Addendum)
Pt st's she had a bil mastectomy 1 week ago and has bil drains.  Pt st's drain has stopped draining on left side and feels like fluid is building up.  Pt also c/o constipation.  Last BM tonight small amount  Pt had a large BM while here and st's constipation is gpne

## 2019-03-05 NOTE — ED Provider Notes (Addendum)
[facility] EMERGENCY DEPARTMENT Provider Note   CSN: [id number] Arrival date & time: 03/04/19  2352    History   Chief Complaint Chief Complaint  Patient presents with  . Post-op Problem  . Constipation    HPI [name] is a 53 y.o. female.     The history is provided by the patient and the spouse.  Illness  Location:  Patient is post operative day 9 from B mastectomy with tissue expander placement Quality:  Patient is here reports markedly decreased drainage of approximately 35 cc each from each of her drains.   Severity:  Severe Onset quality:  Sudden Duration:  1 day Timing:  Constant Progression:  Unchanged Chronicity:  New Context:  Post op Relieved by:  Nothing Worsened by:  Nothing Ineffective treatments:  None tried Associated symptoms: no abdominal pain, no chest pain, no congestion, no cough, no diarrhea, no ear pain, no fatigue, no fever, no headaches, no loss of consciousness, no myalgias, no nausea, no rash, no rhinorrhea, no shortness of breath, no sore throat, no vomiting and no wheezing   Patient is post op day 9 from B mastectomy with tissue expander placement.  She states that she had inflation of her expanders at Dr. Eusebio Friendly office on Thursday and decreased drainage.  She is unable to say how many cc were injected into the expanders.  No f/c/r. She also had constipation but while she was waiting she had a large BM and felt better.    Past Medical History:  Diagnosis Date  . Anemia   . Arthritis   . Cancer [facility])    right breast  . Family history of adverse reaction to anesthesia    sister has same issues  . GERD (gastroesophageal reflux disease)   . PONV (postoperative nausea and vomiting)     Patient Active Problem List   Diagnosis Date Noted  . Acquired absence of bilateral breasts and nipples 03/02/2019  . Breast cancer ([place]) 02/24/2019  . Malignant neoplasm of upper-outer quadrant of right breast in female,  estrogen receptor positive ([place]) 01/21/2019    Past Surgical History:  Procedure Laterality Date  . BREAST RECONSTRUCTION WITH PLACEMENT OF TISSUE EXPANDER AND FLEX HD (ACELLULAR HYDRATED DERMIS) Bilateral 02/24/2019   Procedure: BREAST RECONSTRUCTION WITH PLACEMENT OF TISSUE EXPANDER AND FLEX HD (ACELLULAR HYDRATED DERMIS);  Surgeon: Wallace Going, DO;  Location: [place];  Service: Plastics;  Laterality: Bilateral;  . BREAST SURGERY     left breast bx  . EYE SURGERY     dart to the eye  (right)  . FACIAL FRACTURE SURGERY    . FRACTURE SURGERY    . KNEE SURGERY    . MASTECTOMY W/ SENTINEL NODE BIOPSY Bilateral 02/24/2019   Procedure: RIGHT MASTECTOMY WITH SENTINEL LYMPH NODE MAPPING AND LEFT PROPHYLACTIC MASTECTOMY;  Surgeon: Jovita Kussmaul, MD;  Location: [place];  Service: General;  Laterality: Bilateral;  . SHOULDER SURGERY       OB History   No obstetric history on file.      Home Medications    Prior to Admission medications   Medication Sig Start Date End Date Taking? Authorizing Provider  acetaminophen (TYLENOL) 500 MG tablet Take 1,000 mg by mouth every 6 (six) hours as needed for moderate pain.   Yes [provider]  Cholecalciferol (VITAMIN D3) 125 MCG (5000 UT) CAPS Take 5,000 Units by mouth daily.   Yes [provider]  Cyanocobalamin (B-12) 2500 MCG TABS Take  2,500 mcg by mouth daily.   Yes [provider]  cyclobenzaprine (FLEXERIL) 10 MG tablet Take 1 tablet (10 mg total) by mouth 2 (two) times daily as needed for muscle spasms. 11/05/14  Yes Plunkett, Loree Fee, MD  docusate sodium (COLACE) 100 MG capsule Take 100 mg by mouth daily.   Yes [provider]  FLUoxetine (PROZAC) 10 MG capsule Take 20 mg by mouth daily.    Yes [provider]  HYDROcodone-acetaminophen (NORCO) 10-325 MG tablet Take 1 tablet by mouth every 8 (eight) hours as needed for up to 5 days. 03/04/19 03/09/19 Yes Dillingham, Loel Lofty, DO  ibuprofen  (ADVIL,MOTRIN) 200 MG tablet Take 600 mg by mouth every 6 (six) hours as needed for moderate pain.    Yes [provider]  Iron-Vitamin C 65-125 MG TABS Take 1 tablet by mouth daily.   Yes [provider]  Multiple Vitamin (MULTIVITAMIN WITH MINERALS) TABS tablet Take 1 tablet by mouth daily.   Yes [provider]  omeprazole (PRILOSEC) 20 MG capsule Take 20 mg by mouth daily.   Yes [provider]  Diclofenac Sodium CR 100 MG 24 hr tablet Take 1 tablet (100 mg total) by mouth daily. 03/05/19   Deshay Kirstein, MD    Family History Family History  Problem Relation Age of Onset  . Brain cancer Father   . Cancer Maternal Uncle        melanoma   . Cancer Paternal Uncle        gastric cancer  . Breast cancer Neg Hx     Social History Social History   Tobacco Use  . Smoking status: Never Smoker  . Smokeless tobacco: Never Used  Substance Use Topics  . Alcohol use: Yes    Comment: occasional  . Drug use: No     Allergies   Codeine and Tramadol   Review of Systems Review of Systems  Constitutional: Negative for fatigue and fever.  HENT: Negative for congestion, ear pain, rhinorrhea and sore throat.   Respiratory: Negative for cough, chest tightness, shortness of breath and wheezing.   Cardiovascular: Negative for chest pain.  Gastrointestinal: Negative for abdominal pain, diarrhea, nausea and vomiting.  Musculoskeletal: Negative for myalgias.  Skin: Negative for rash.  Neurological: Negative for loss of consciousness and headaches.  All other systems reviewed and are negative.    Physical Exam Updated Vital Signs BP (!) 123/93 (BP Location: Left Arm)   Pulse (!) 102   Temp 98.3 F (36.8 C) (Oral)   Resp 18   Ht 5\' 3"  (1.6 m)   Wt 77.1 kg   SpO2 96%   BMI 30.11 kg/m   Physical Exam Vitals signs and nursing note reviewed.  Constitutional:      Appearance: Normal appearance. She is normal weight. She is not toxic-appearing.   HENT:     Head: Normocephalic and atraumatic.     Nose: Nose normal.     Mouth/Throat:     Mouth: Mucous membranes are moist.     Pharynx: Oropharynx is clear.  Eyes:     Conjunctiva/sclera: Conjunctivae normal.     Pupils: Pupils are equal, round, and reactive to light.  Neck:     Musculoskeletal: Normal range of motion and neck supple.  Cardiovascular:     Rate and Rhythm: Normal rate and regular rhythm.     Pulses: Normal pulses.     Heart sounds: Normal heart sounds.  Pulmonary:     Effort: Pulmonary effort  is normal.     Breath sounds: Normal breath sounds.  Chest:    Abdominal:     General: Abdomen is flat. Bowel sounds are normal. There is no distension.     Tenderness: There is no abdominal tenderness. There is no guarding or rebound.     Hernia: No hernia is present.  Musculoskeletal: Normal range of motion.  Skin:    General: Skin is warm.     Capillary Refill: Capillary refill takes less than 2 seconds.     Findings: No erythema.     Comments: No signs of infection at mastectomy sites nor at JP sites  Neurological:     General: No focal deficit present.     Mental Status: She is alert and oriented to person, place, and time.     Deep Tendon Reflexes: Reflexes normal.  Psychiatric:        Mood and Affect: Mood normal.        Behavior: Behavior normal.      ED Treatments / Results  Labs (all labs ordered are listed, but only abnormal results are displayed) Results for orders placed or performed during the hospital encounter of 03/04/19  Lactic acid, plasma  Result Value Ref Range   Lactic Acid, Venous 1.3 0.5 - 1.9 mmol/L  Comprehensive metabolic panel  Result Value Ref Range   Sodium 139 135 - 145 mmol/L   Potassium 3.6 3.5 - 5.1 mmol/L   Chloride 104 98 - 111 mmol/L   CO2 26 22 - 32 mmol/L   Glucose, Bld 113 (H) 70 - 99 mg/dL   BUN 9 6 - 20 mg/dL   Creatinine, Ser 0.62 0.44 - 1.00 mg/dL   Calcium 9.6 8.9 - 10.3 mg/dL   Total Protein 6.8 6.5 - 8.1  g/dL   Albumin 3.6 3.5 - 5.0 g/dL   AST 29 15 - 41 U/L   ALT 26 0 - 44 U/L   Alkaline Phosphatase 75 38 - 126 U/L   Total Bilirubin 0.4 0.3 - 1.2 mg/dL   GFR calc non Af Amer >60 >60 mL/min   GFR calc Af Amer >60 >60 mL/min   Anion gap 9 5 - 15  CBC with Differential  Result Value Ref Range   WBC 15.1 (H) 4.0 - 10.5 K/uL   RBC 3.90 3.87 - 5.11 MIL/uL   Hemoglobin 11.0 (L) 12.0 - 15.0 g/dL   HCT 34.7 (L) 36.0 - 46.0 %   MCV 89.0 80.0 - 100.0 fL   MCH 28.2 26.0 - 34.0 pg   MCHC 31.7 30.0 - 36.0 g/dL   RDW 12.7 11.5 - 15.5 %   Platelets 384 150 - 400 K/uL   nRBC 0.0 0.0 - 0.2 %   Neutrophils Relative % 80 %   Neutro Abs 12.2 (H) 1.7 - 7.7 K/uL   Lymphocytes Relative 10 %   Lymphs Abs 1.5 0.7 - 4.0 K/uL   Monocytes Relative 7 %   Monocytes Absolute 1.0 0.1 - 1.0 K/uL   Eosinophils Relative 2 %   Eosinophils Absolute 0.2 0.0 - 0.5 K/uL   Basophils Relative 1 %   Basophils Absolute 0.1 0.0 - 0.1 K/uL   Immature Granulocytes 0 %   Abs Immature Granulocytes 0.06 0.00 - 0.07 K/uL  Urinalysis, Routine w reflex microscopic  Result Value Ref Range   Color, Urine STRAW (A) YELLOW   APPearance CLEAR CLEAR   Specific Gravity, Urine 1.005 1.005 - 1.030   pH 7.0 5.0 -  8.0   Glucose, UA NEGATIVE NEGATIVE mg/dL   Hgb urine dipstick NEGATIVE NEGATIVE   Bilirubin Urine NEGATIVE NEGATIVE   Ketones, ur NEGATIVE NEGATIVE mg/dL   Protein, ur NEGATIVE NEGATIVE mg/dL   Nitrite NEGATIVE NEGATIVE   Leukocytes,Ua NEGATIVE NEGATIVE   Ct Angio Chest Pe W And/or Wo Contrast  Result Date: 03/05/2019 CLINICAL DATA:  Status post bilateral mastectomy 1 week ago. Pain. Left breast swelling. EXAM: CT ANGIOGRAPHY CHEST WITH CONTRAST TECHNIQUE: Multidetector CT imaging of the chest was performed using the standard protocol during bolus administration of intravenous contrast. Multiplanar CT image reconstructions and MIPs were obtained to evaluate the vascular anatomy. CONTRAST:  67mL OMNIPAQUE IOHEXOL 350  MG/ML SOLN COMPARISON:  Chest x-ray 11/18/2018 FINDINGS: Cardiovascular: No filling defects in the pulmonary arteries to suggest pulmonary emboli. Heart is normal size. Aorta is normal caliber. Retroesophageal right subclavian artery noted. Mediastinum/Nodes: No mediastinal, hilar, or axillary adenopathy. Lungs/Pleura: Dependent atelectasis. No confluent airspace opacities or effusions. Upper Abdomen: Imaging into the upper abdomen shows no acute findings. Musculoskeletal: Bilateral breast implants and tissue expanders noted. Bilateral surgical drains. No acute bony abnormality. Review of the MIP images confirms the above findings. IMPRESSION: No evidence of pulmonary embolus. Postoperative changes with bilateral breast implants/tissue expanders and surgical drains. No focal fluid collection. Dependent atelectasis in the lower lobes. Electronically Signed   By: Rolm Baptise M.D.   On: 03/05/2019 03:03   Nm Sentinel Node Inj-no Rpt (breast)  Result Date: 02/24/2019 Sulfur colloid was injected by the nuclear medicine technologist for melanoma sentinel node.    Radiology Ct Angio Chest Pe W And/or Wo Contrast  Result Date: 03/05/2019 CLINICAL DATA:  Status post bilateral mastectomy 1 week ago. Pain. Left breast swelling. EXAM: CT ANGIOGRAPHY CHEST WITH CONTRAST TECHNIQUE: Multidetector CT imaging of the chest was performed using the standard protocol during bolus administration of intravenous contrast. Multiplanar CT image reconstructions and MIPs were obtained to evaluate the vascular anatomy. CONTRAST:  49mL OMNIPAQUE IOHEXOL 350 MG/ML SOLN COMPARISON:  Chest x-ray 11/18/2018 FINDINGS: Cardiovascular: No filling defects in the pulmonary arteries to suggest pulmonary emboli. Heart is normal size. Aorta is normal caliber. Retroesophageal right subclavian artery noted. Mediastinum/Nodes: No mediastinal, hilar, or axillary adenopathy. Lungs/Pleura: Dependent atelectasis. No confluent airspace opacities or  effusions. Upper Abdomen: Imaging into the upper abdomen shows no acute findings. Musculoskeletal: Bilateral breast implants and tissue expanders noted. Bilateral surgical drains. No acute bony abnormality. Review of the MIP images confirms the above findings. IMPRESSION: No evidence of pulmonary embolus. Postoperative changes with bilateral breast implants/tissue expanders and surgical drains. No focal fluid collection. Dependent atelectasis in the lower lobes. Electronically Signed   By: Rolm Baptise M.D.   On: 03/05/2019 03:03    Procedures Procedures (including critical care time)  Medications Ordered in ED Medications  sodium chloride flush (NS) 0.9 % injection 3 mL (has no administration in time range)  fentaNYL (SUBLIMAZE) injection 50 mcg (50 mcg Intravenous Given 03/05/19 0147)  ondansetron (ZOFRAN) injection 4 mg (4 mg Intravenous Given 03/05/19 0152)  iohexol (OMNIPAQUE) 350 MG/ML injection 75 mL (75 mLs Intravenous Contrast Given 03/05/19 0240)  ketorolac (TORADOL) 15 MG/ML injection 15 mg (15 mg Intravenous Given 03/05/19 0450)     Case d/w Dr. Greer Pickerel of general surgery.  All labs and imaging reviewed.  Patient is stable for discharge.  Drains are not obstructed if they have fluid in them.  He recommends following up with Dr. Marla Roe today for evaluation of amount of fluid  in the expanders.  NSAIDs ok at this time.    Final Clinical Impressions(s) / ED Diagnoses   Final diagnoses:  Post-operative pain   Return for intractable cough, coughing up blood,fevers >100.4 unrelieved by medication, shortness of breath, intractable vomiting, chest pain, shortness of breath, weakness,numbness, changes in speech, facial asymmetry,abdominal pain, passing out,Inability to tolerate liquids or food, cough, altered mental status or any concerns. No signs of systemic illness or infection. The patient is nontoxic-appearing on exam and vital signs are within normal limits.   I have  reviewed the triage vital signs and the nursing notes. Pertinent labs &imaging results that were available during my care of the patient were reviewed by me and considered in my medical decision making (see chart for details).  After history, exam, and medical workup I feel the patient has been appropriately medically screened and is safe for discharge home. Pertinent diagnoses were discussed with the patient. Patient was given return precautions.  ED Discharge Orders         Ordered    Diclofenac Sodium CR 100 MG 24 hr tablet  Daily     03/05/19 0513           Yann Biehn, MD 03/05/19 2010    Randal Buba, Rashawna Scoles, MD 03/05/19 0712

## 2019-03-05 NOTE — Progress Notes (Signed)
   Subjective:    Patient ID: [name], female    DOB: Jun 29, 1966, 53 y.o.   MRN: [id number]  The patient is a 53 year old female here for follow-up on her bilateral breast reconstruction.  She had bilateral mastectomies with expander and Flex HD placement.  I saw her a few days ago.  She was doing well.  She was a bit tearful and her pain level was pretty high.  She complained of constipation and went to the ER yesterday due to impaction.  She is doing much better today. She was concerned that last night it seemed her left breast got larger.  It seems appropriate now on exam.  Drains in place and working.   Review of Systems  Constitutional: Negative for activity change and appetite change.  HENT: Negative.   Cardiovascular: Negative.   Gastrointestinal: Positive for constipation.  Musculoskeletal: Negative.   Psychiatric/Behavioral: Negative.        Objective:   Physical Exam Vitals signs and nursing note reviewed.  Constitutional:      Appearance: Normal appearance.  HENT:     Head: Normocephalic and atraumatic.  Cardiovascular:     Rate and Rhythm: Normal rate.  Pulmonary:     Effort: Pulmonary effort is normal. No respiratory distress.     Breath sounds: No stridor.  Chest:     Chest wall: Tenderness present.  Neurological:     General: No focal deficit present.     Mental Status: She is alert.  Psychiatric:        Mood and Affect: Mood normal.        Behavior: Behavior normal.       Assessment & Plan:  Acquired absence of bilateral breasts and nipples Continue with breast binder and light massage.  Be sure to use stool softener.  Follow-up Tuesday of next week.

## 2019-03-09 ENCOUNTER — Other Ambulatory Visit: Payer: Self-pay

## 2019-03-09 ENCOUNTER — Encounter: Payer: Self-pay | Admitting: Plastic Surgery

## 2019-03-09 ENCOUNTER — Ambulatory Visit (INDEPENDENT_AMBULATORY_CARE_PROVIDER_SITE_OTHER): Payer: BLUE CROSS/BLUE SHIELD | Admitting: Plastic Surgery

## 2019-03-09 VITALS — BP 116/80 | HR 78 | Temp 97.5°F | Ht 63.0 in | Wt 170.0 lb

## 2019-03-09 DIAGNOSIS — C50411 Malignant neoplasm of upper-outer quadrant of right female breast: Secondary | ICD-10-CM

## 2019-03-09 DIAGNOSIS — Z170 Estrogen receptor positive status [ER+]: Secondary | ICD-10-CM

## 2019-03-09 DIAGNOSIS — Z9013 Acquired absence of bilateral breasts and nipples: Secondary | ICD-10-CM

## 2019-03-09 MED ORDER — DIAZEPAM 2 MG PO TABS: 2.0000 mg | ORAL_TABLET | Freq: Four times a day (QID) | ORAL | 0 refills | Status: DC | PRN

## 2019-03-09 NOTE — Progress Notes (Signed)
   Subjective:    Patient ID: [name], female    DOB: June 16, 1966, 53 y.o.   MRN: [id number]  The patient is a 53 yrs old female here for post operative follow up on her bilateral breast reconstruction.  She had bilateral mastectomies with immediate reconstruction two weeks ago.  The drain output is minimal.  No sign of infection.  Overall doing very well.    Review of Systems  Constitutional: Negative.   HENT: Negative.   Eyes: Negative.   Respiratory: Negative.   Cardiovascular: Negative.   Genitourinary: Negative.   Musculoskeletal: Negative.   Psychiatric/Behavioral: Negative.       Objective:   Physical Exam Vitals signs and nursing note reviewed.  Constitutional:      Appearance: Normal appearance.  HENT:     Head: Normocephalic and atraumatic.  Cardiovascular:     Rate and Rhythm: Normal rate.  Skin:    General: Skin is warm.  Neurological:     General: No focal deficit present.     Mental Status: She is alert. Mental status is at baseline.  Psychiatric:        Mood and Affect: Mood normal.        Behavior: Behavior normal.        Assessment & Plan:  Acquired absence of bilateral breasts and nipples  Malignant neoplasm of upper-outer quadrant of right breast in female, estrogen receptor positive (HCC)  Valium sent in for refill. Drains removed on both sides.   No expansion today: Right: 250 / 535 cc Left: 250 / 535 cc

## 2019-03-10 LAB — CULTURE, BLOOD (ROUTINE X 2)
Culture: NO GROWTH
Culture: NO GROWTH
Special Requests: ADEQUATE
Special Requests: ADEQUATE

## 2019-03-17 ENCOUNTER — Telehealth: Payer: Self-pay

## 2019-03-17 NOTE — Telephone Encounter (Signed)
Patient called stating she had a double mastectomy 3 weeks ago and wants to know what the next step is, called patient back to let her know she is scheduled for tomorrow at 3:30 for a phone visit with Dr. Burr Medico.  Asked her to call back if this doesn't work for her.

## 2019-03-18 ENCOUNTER — Inpatient Hospital Stay: Payer: BLUE CROSS/BLUE SHIELD | Attending: Hematology | Admitting: Hematology

## 2019-03-18 ENCOUNTER — Encounter: Payer: Self-pay | Admitting: Hematology

## 2019-03-18 DIAGNOSIS — Z9013 Acquired absence of bilateral breasts and nipples: Secondary | ICD-10-CM | POA: Insufficient documentation

## 2019-03-18 DIAGNOSIS — Z7689 Persons encountering health services in other specified circumstances: Secondary | ICD-10-CM | POA: Insufficient documentation

## 2019-03-18 DIAGNOSIS — Z79899 Other long term (current) drug therapy: Secondary | ICD-10-CM | POA: Insufficient documentation

## 2019-03-18 DIAGNOSIS — C50411 Malignant neoplasm of upper-outer quadrant of right female breast: Secondary | ICD-10-CM | POA: Insufficient documentation

## 2019-03-18 DIAGNOSIS — Z170 Estrogen receptor positive status [ER+]: Secondary | ICD-10-CM | POA: Diagnosis not present

## 2019-03-18 DIAGNOSIS — M1990 Unspecified osteoarthritis, unspecified site: Secondary | ICD-10-CM | POA: Insufficient documentation

## 2019-03-18 DIAGNOSIS — K219 Gastro-esophageal reflux disease without esophagitis: Secondary | ICD-10-CM | POA: Insufficient documentation

## 2019-03-18 DIAGNOSIS — M25561 Pain in right knee: Secondary | ICD-10-CM | POA: Insufficient documentation

## 2019-03-18 DIAGNOSIS — Z5111 Encounter for antineoplastic chemotherapy: Secondary | ICD-10-CM | POA: Insufficient documentation

## 2019-03-18 MED ORDER — ONDANSETRON HCL 8 MG PO TABS: 8.0000 mg | ORAL_TABLET | Freq: Two times a day (BID) | ORAL | 1 refills | Status: DC | PRN

## 2019-03-18 MED ORDER — PROCHLORPERAZINE MALEATE 10 MG PO TABS: 10.0000 mg | ORAL_TABLET | Freq: Four times a day (QID) | ORAL | 1 refills | Status: DC | PRN

## 2019-03-18 MED ORDER — DEXAMETHASONE 4 MG PO TABS: 8.0000 mg | ORAL_TABLET | Freq: Two times a day (BID) | ORAL | 1 refills | Status: DC

## 2019-03-18 NOTE — Progress Notes (Addendum)
[place]   [phone] [phone]   Clinic Follow up Note   Patient Care Team: Merwyn Katos as PCP - General (Physician Assistant) Jovita Kussmaul, MD as Consulting Physician (General Surgery) Truitt Merle, MD as Consulting Physician (Hematology) Gery Pray, MD as Consulting Physician (Radiation Oncology) Rockwell Germany, RN as Oncology Nurse Navigator Mauro Kaufmann, RN as Oncology Nurse Navigator   I connected with [name] on 03/18/2019 at  3:30 PM EDT by telephone and verified that I am speaking with the correct person using two identifiers.   I discussed the limitations, risks, security and privacy concerns of performing an evaluation and management service by telephone and the availability of in person appointments. I also discussed with the patient that there may be a patient responsible charge related to this service. The patient expressed understanding and agreed to proceed.    CHIEF COMPLAINT: f/u of right breast cancer   SUMMARY OF ONCOLOGIC HISTORY: Oncology History   Cancer Staging Malignant neoplasm of upper-outer quadrant of right breast in female, estrogen receptor positive ([place]) Staging form: Breast, AJCC 8th Edition - Clinical stage from 01/15/2019: Stage IA (cT1b, cN0, cM0, G1, ER+, PR-, HER2-) - Signed by Truitt Merle, MD on 01/26/2019 - Pathologic stage from 02/24/2019: Stage IB (pT1a, pN1a, cM0, G1, ER+, PR-, HER2-) - Signed by Truitt Merle, MD on 03/18/2019       Malignant neoplasm of upper-outer quadrant of right breast in female, estrogen receptor positive ([place])   01/15/2019 Cancer Staging    Staging form: Breast, AJCC 8th Edition - Clinical stage from 01/15/2019: Stage IA (cT1b, cN0, cM0, G1, ER+, PR-, HER2-) - Signed by Truitt Merle, MD on 01/26/2019    01/15/2019 Mammogram    Diagnostic Mammogram 01/15/19  IMPRESSION Targeted ultrasound is performed, showing right breast 10 o'clock 5 cm from the nipple  hypoechoic round mass measuring 0.7 x 0.5 x 0.6 cm. Adjacent to it there is a probable complicated cyst measuring 4 mm. There is no evidence of right axillary lymphadenopathy.    01/15/2019 Initial Biopsy    Diagnosis 01/15/19  Breast, right, needle core biopsy, upper outer - INVASIVE AND IN SITU MAMMARY CARCINOMA WITH CALCIFICATIONS. - SEE COMMENT. 1 of 3 FINAL for Poster, Shari P ([id number]) Microscopic Comment The carcinoma appears grade I. The malignant cells are positive for cytokeratin AE1/AE3 and negative for E-cadherin. A breast prognostic profile will be performed and the results reported separately. Dr Vicente Males has reviewed the case and concurs with this interpretation. The results are called to [facility] on 01/18/2019. (JBK:ah:ecj 01/18/19)    01/15/2019 Receptors her2    PROGNOSTIC INDICATORS Results: IMMUNOHISTOCHEMICAL AND MORPHOMETRIC ANALYSIS PERFORMED MANUALLY The tumor cells are NEGATIVE for Her2 (0). Estrogen Receptor: 10%, POSITIVE, MODERATE STAINING INTENSITY Progesterone Receptor: 0%, NEGATIVE Proliferation Marker Ki67: <1%    01/21/2019 Initial Diagnosis    Malignant neoplasm of upper-outer quadrant of right breast in female, estrogen receptor positive ([place])    01/29/2019 Breast MRI    IMPRESSION: 1. The biopsy-proven site of malignancy in the upper-outer quadrant of the right breast resides centrally with in an area of suspicious non mass enhancement spanning 6-7 cm.  2. There is a 6 mm suspicious enhancing mass in the lower-inner quadrant of the right breast, posterior depth.  3. There is a 9 mm indeterminate enhancing mass in the lower outer quadrant of the right breast.  4.  No evidence of malignancy in  the left breast.    02/24/2019 Surgery    RIGHT MASTECTOMY WITH SENTINEL LYMPH NODE MAPPING AND LEFT PROPHYLACTIC MASTECTOMY by Dr. Marlou Starks  BREAST RECONSTRUCTION WITH PLACEMENT OF TISSUE EXPANDER by Dr. Marla Roe  02/24/19      02/24/2019 Pathology Results    Diagnosis 1. Breast, simple mastectomy, Left - BENIGN BREAST PARENCHYMA WITH FIBROCYSTIC CHANGE. - NEGATIVE FOR CARCINOMA. 2. Breast, simple mastectomy, Right - LOBULAR CARCINOMA IN SITU, INTERMEDIATE TO HIGH NUCLEAR GRADE. SEE NOTE. - NO EVIDENCE OF RESIDUAL INVASIVE CARCINOMA. - RESECTION MARGINS ARE NEGATIVE FOR IN SITU OR INVASIVE CARCINOMA. - BIOPSY SITE CHANGES. - SEE ONCOLOGY TABLE. 3. Lymph node, sentinel, biopsy, Right #1 - IMMUNOHISTOCHEMICAL STAIN FOR AE1/AE3 IS NEGATIVE FOR CARCINOMA (0/1). 4. Lymph node, sentinel, biopsy, Right - IMMUNOHISTOCHEMICAL STAIN FOR AE1/AE3 IS NEGATIVE FOR CARCINOMA (0/1). 5. Lymph node, sentinel, biopsy, Right #2 - IMMUNOHISTOCHEMICAL STAIN FOR AE1/AE3 IS NEGATIVE FOR CARCINOMA (0/1). 6. Lymph node, sentinel, biopsy, Right - IMMUNOHISTOCHEMICAL STAIN FOR AE1/AE3 IS NEGATIVE FOR CARCINOMA (0/1). 7. Lymph node, sentinel, biopsy, Right - METASTATIC CARCINOMA TO ONE LYMPH NODE (1/1) (CONFIRMED WITH IMMUNOHISTOCHEMICAL STAIN FOR AE1/AE3) - FOCUS OF METASTATIC CARCINOMA MEASURES JUST OVER 0.2 CM IN GREATEST DIMENSION - NO EVIDENCE OF EXTRANODAL EXTENSION     Chemotherapy    adjuvant Taxol and Carboplatin every 3 weeks for 4 cycles starting in 2 weeks      02/24/2019 Cancer Staging    Staging form: Breast, AJCC 8th Edition - Pathologic stage from 02/24/2019: Stage IB (pT1a, pN1a, cM0, G1, ER+, PR-, HER2-) - Signed by Truitt Merle, MD on 03/18/2019      CURRENT THERAPY:  PENDING adjuvant Docetaxel and Cytoxan every 3 weeks for 4 cycles starting in 2 weeks   INTERVAL HISTORY:  [name] is here for a follow up of right breast cancer. She was able to identify herself by birth date and name. Her husband accompanied her on phone call. Since her last visit she underwent a double mastectomy and expanders in place. She notes she is recovering from surgery well and incision healing very well. She went to ED on 03/04/19 due  to pain in left breast from tissue expanders. This pain happened nightly and now pain is resolved. Her draining tubes have been removed. She notes she is back to baseline and has adequate appetite and energy. I reviewed her medication list with her. There are no changes.  She shares concerns for her ability to tolerate treatment as she has a special needs daughter at home to take care of.    REVIEW OF SYSTEMS:   Constitutional: Denies fevers, chills or abnormal weight loss Eyes: Denies blurriness of vision Ears, nose, mouth, throat, and face: Denies mucositis or sore throat Respiratory: Denies cough, dyspnea or wheezes Cardiovascular: Denies palpitation, chest discomfort or lower extremity swelling Gastrointestinal:  Denies nausea, heartburn or change in bowel habits Skin: Denies abnormal skin rashes Lymphatics: Denies new lymphadenopathy or easy bruising Neurological:Denies numbness, tingling or new weaknesses Behavioral/Psych: Mood is stable, no new changes  All other systems were reviewed with the patient and are negative.  MEDICAL HISTORY:  Past Medical History:  Diagnosis Date  . Anemia   . Arthritis   . Cancer [facility])    right breast  . Family history of adverse reaction to anesthesia    sister has same issues  . GERD (gastroesophageal reflux disease)   . PONV (postoperative nausea and vomiting)     SURGICAL HISTORY: Past Surgical History:  Procedure  Laterality Date  . BREAST RECONSTRUCTION WITH PLACEMENT OF TISSUE EXPANDER AND FLEX HD (ACELLULAR HYDRATED DERMIS) Bilateral 02/24/2019   Procedure: BREAST RECONSTRUCTION WITH PLACEMENT OF TISSUE EXPANDER AND FLEX HD (ACELLULAR HYDRATED DERMIS);  Surgeon: Wallace Going, DO;  Location: [facility];  Service: Plastics;  Laterality: Bilateral;  . BREAST SURGERY     left breast bx  . EYE SURGERY     dart to the eye  (right)  . FACIAL FRACTURE SURGERY    . FRACTURE SURGERY    . KNEE SURGERY    . MASTECTOMY W/ SENTINEL NODE BIOPSY  Bilateral 02/24/2019   Procedure: RIGHT MASTECTOMY WITH SENTINEL LYMPH NODE MAPPING AND LEFT PROPHYLACTIC MASTECTOMY;  Surgeon: Jovita Kussmaul, MD;  Location: [facility];  Service: General;  Laterality: Bilateral;  . SHOULDER SURGERY      I have reviewed the social history and family history with the patient and they are unchanged from previous note.  ALLERGIES:  is allergic to codeine and tramadol.  MEDICATIONS:  Current Outpatient Medications  Medication Sig Dispense Refill  . acetaminophen (TYLENOL) 500 MG tablet Take 1,000 mg by mouth every 6 (six) hours as needed for moderate pain.    . Cholecalciferol (VITAMIN D3) 125 MCG (5000 UT) CAPS Take 5,000 Units by mouth daily.    . Cyanocobalamin (B-12) 2500 MCG TABS Take 2,500 mcg by mouth daily.    . cyclobenzaprine (FLEXERIL) 10 MG tablet Take 1 tablet (10 mg total) by mouth 2 (two) times daily as needed for muscle spasms. 20 tablet 0  . diazepam (VALIUM) 2 MG tablet Take 1 tablet (2 mg total) by mouth every 6 (six) hours as needed for anxiety. 20 tablet 0  . Diclofenac Sodium CR 100 MG 24 hr tablet Take 1 tablet (100 mg total) by mouth daily. 5 tablet 0  . docusate sodium (COLACE) 100 MG capsule Take 100 mg by mouth daily.    Marland Kitchen FLUoxetine (PROZAC) 10 MG capsule Take 20 mg by mouth daily.     Marland Kitchen ibuprofen (ADVIL,MOTRIN) 200 MG tablet Take 600 mg by mouth every 6 (six) hours as needed for moderate pain.     . Iron-Vitamin C 65-125 MG TABS Take 1 tablet by mouth daily.    . Multiple Vitamin (MULTIVITAMIN WITH MINERALS) TABS tablet Take 1 tablet by mouth daily.    Marland Kitchen omeprazole (PRILOSEC) 20 MG capsule Take 20 mg by mouth daily.     No current facility-administered medications for this visit.     PHYSICAL EXAMINATION: ECOG PERFORMANCE STATUS: 1 - Symptomatic but completely ambulatory  -No Vitals Taken Today   Exam not performed  LABORATORY DATA:  I have reviewed the data as listed CBC Latest Ref Rng & Units 03/05/2019 02/18/2019 01/27/2019   WBC 4.0 - 10.5 K/uL 15.1(H) 6.7 5.8  Hemoglobin 12.0 - 15.0 g/dL 11.0(L) 11.4(L) 12.3  Hematocrit 36.0 - 46.0 % 34.7(L) 35.3(L) 37.3  Platelets 150 - 400 K/uL 384 308 304     CMP Latest Ref Rng & Units 03/05/2019 02/18/2019 01/27/2019  Glucose 70 - 99 mg/dL 113(H) 92 101(H)  BUN 6 - 20 mg/dL _0 Creatinine 0.44 - 1.00 mg/dL 0.62 0.67 0.84  Sodium 135 - 145 mmol/L 139 138 140  Potassium 3.5 - 5.1 mmol/L 3.6 4.9 4.5  Chloride 98 - 111 mmol/L 104 103 103  CO2 22 - 32 mmol/L _1 Calcium 8.9 - 10.3 mg/dL 9.6 10.1 10.3  Total Protein 6.5 - 8.1 g/dL  6.8 - 7.6  Total Bilirubin 0.3 - 1.2 mg/dL 0.4 - 0.5  Alkaline Phos 38 - 126 U/L 75 - 74  AST 15 - 41 U/L 29 - 19  ALT 0 - 44 U/L 26 - 14      RADIOGRAPHIC STUDIES: I have personally reviewed the radiological images as listed and agreed with the findings in the report. No results found.   ASSESSMENT & PLAN:  [name] is a 53 y.o. female with   1. Malignant neoplasm of upper-outer quadrant of right breast, invasive lobular carcinoma, stage IA, p(T1a,N1a,M0), ER 10%+, PR-, HER2- Grade I   -She was recently diagnosed in 12/2018. She is s/p b/l mastectomy on 02/24/19.  -I discussed the pathology results with her in great detail which shows her small tumor was removed by biopsy alone and there were no cancer cells found in her mastectomy sample. Her margins were negative, however she has 1/5 positive lymph nodes.  -Unfortunately there was insufficient sample from tumor to complete Mammaprint to predict her risk of recurrence.  -Given her PR/HER2 negative with low ER positive disease and positive lymph node the biology of the tumor indicates it is likely more aggressive and potential moderate to higher risk of recurrence.  -To reduce her risk of recurrence I recommend her adjuvant chemotherapy. .    -For chemo therapy I recommend moderate intensive Docetaxel and Cytoxan regimen every 3 weeks for 4 cycles.   --Chemotherapy consent:  Side effects including but does not limited to, fatigue, nausea, vomiting, diarrhea, hair loss, neuropathy, fluid retention, renal and kidney dysfunction, neutropenic fever, needed for blood transfusion, bleeding, were discussed with patient in great detail. She agrees to proceed. Plan to start in 2 weeks  -the goal of therapy is curative  -She will proceed with chemo education class next week  -She will likely not need a PAC for infusion unless her peripheral vein access is difficult. I encouraged her to drink plenty of water before infusions.  -To reduce her hair loss, I discussed the option of Dignicap with infusions. She will think about it and I will provide her with further information. -I discussed her risk of contracting COVID-19 when on chemo. I reviewed COVID-19 precautions and encouraged her to continue social distancing.  -Given her positive lymph node, post-mastectomy radiation is still recommended to reduce her risk of local recurrence. Giving the positive ER, I recommend adjuvant endocrine therapy with aromatase inhibitor for a total of 5 years to reduce the risk of distant cancer recurrence, which will start after she completes radiation  -F/u in 2 weeks before first cycle chemo   2. Genetic Testing  -She does not some family history for malignancy so I will see if her insurance will cover testing as she is interested.    3. Social support  -She takes care of her daughter full time who is on total care and ventilator  -She has good family support    PLAN:  -I recommend adjuvant chemo TC every 3 weeks for 4 cycles, starting in 2 weeks -will call in dexa to take the day before chemo and for 3 days after chemo. I called in antiemetics as well.  -Chemo class next week  -lab, f/u and chemo in 2 weeks     No problem-specific Assessment & Plan notes found for this encounter.   No orders of the defined types were placed in this encounter.  All questions were answered. The  patient knows to call the clinic  with any problems, questions or concerns. No barriers to learning was detected. I spent 25 minutes counseling the patient and her husband on the phone. The total time spent in the appointment was 30 minutes.      Truitt Merle, MD 03/18/2019   I, Joslyn Devon, am acting as scribe for Truitt Merle, MD.   I have reviewed the above documentation for accuracy and completeness, and I agree with the above.]

## 2019-03-18 NOTE — Progress Notes (Signed)
START ON PATHWAY REGIMEN - Breast     A cycle is every 21 days:     Docetaxel      Cyclophosphamide   **Always confirm dose/schedule in your pharmacy ordering system**  Patient Characteristics: Postoperative without Neoadjuvant Therapy (Pathologic Staging), Invasive Disease, Adjuvant Therapy, HER2 Negative/Unknown/Equivocal, ER Positive, Node Positive, Node Positive (1-3), Genomic Testing Not Performed, Chemotherapy Candidate Therapeutic Status: Postoperative without Neoadjuvant Therapy (Pathologic Staging) AJCC Grade: G1 AJCC N Category: pN1a AJCC M Category: cM0 ER Status: Positive (+) AJCC 8 Stage Grouping: IB HER2 Status: Negative (-) Oncotype Dx Recurrence Score: Not Appropriate AJCC T Category: pT1a PR Status: Negative (-) Has this patient completed genomic testing<= No - Did Not Order Test  Intent of Therapy: Curative Intent, Discussed with Patient

## 2019-03-19 ENCOUNTER — Ambulatory Visit (INDEPENDENT_AMBULATORY_CARE_PROVIDER_SITE_OTHER): Payer: BLUE CROSS/BLUE SHIELD | Admitting: Plastic Surgery

## 2019-03-19 ENCOUNTER — Other Ambulatory Visit: Payer: Self-pay

## 2019-03-19 ENCOUNTER — Encounter: Payer: Self-pay | Admitting: Plastic Surgery

## 2019-03-19 ENCOUNTER — Telehealth: Payer: Self-pay | Admitting: *Deleted

## 2019-03-19 ENCOUNTER — Telehealth: Payer: Self-pay | Admitting: Hematology

## 2019-03-19 VITALS — BP 112/78 | HR 78 | Temp 98.5°F | Ht 63.0 in | Wt 170.0 lb

## 2019-03-19 DIAGNOSIS — C50411 Malignant neoplasm of upper-outer quadrant of right female breast: Secondary | ICD-10-CM

## 2019-03-19 DIAGNOSIS — Z170 Estrogen receptor positive status [ER+]: Secondary | ICD-10-CM

## 2019-03-19 DIAGNOSIS — Z9013 Acquired absence of bilateral breasts and nipples: Secondary | ICD-10-CM

## 2019-03-19 NOTE — Telephone Encounter (Signed)
Called and scheduled appt per 4/2 los.  Patient aware of appt date and time.

## 2019-03-19 NOTE — Telephone Encounter (Signed)
Spoke with patient regarding dignicap.  After discussing with patient she is not interested in pursuing the dignicap.  She would like to be able to do her chemo education either over the phone or via webex.  She has a special needs child who is on a ventilator and would prefer not come in. I have sent a message to our patient education nurses to see if this could happen.  Encouraged patient to call with any questions or concerns.

## 2019-03-19 NOTE — Progress Notes (Signed)
   Subjective:    Patient ID: [name], female    DOB: February 26, 1966, 53 y.o.   MRN: [id number]  The patient is a 53 yrs old wf here for continued follow up on her breast reconstruction.  She is doing much better and feeling better.  The skin is soft and the incisions are healing nicely.  No sign of infection, hematoma or seroma.  She will start chemo in next 1 - 2 weeks.    Review of Systems  Constitutional: Negative.   HENT: Negative.   Eyes: Negative.   Respiratory: Negative.   Cardiovascular: Negative.   Gastrointestinal: Negative.   Endocrine: Negative.   Genitourinary: Negative.   Musculoskeletal: Negative.   Psychiatric/Behavioral: Negative.        Objective:   Physical Exam Vitals signs and nursing note reviewed.  Constitutional:      Appearance: Normal appearance.  HENT:     Head: Normocephalic and atraumatic.  Cardiovascular:     Rate and Rhythm: Normal rate.  Pulmonary:     Effort: Pulmonary effort is normal.  Neurological:     General: No focal deficit present.     Mental Status: She is alert.  Psychiatric:        Mood and Affect: Mood normal.        Behavior: Behavior normal.        Assessment & Plan:  Malignant neoplasm of upper-outer quadrant of right breast in female, estrogen receptor positive (HCC)  Acquired absence of bilateral breasts and nipples  We placed injectable saline in the Expander using a sterile technique: Right: 50 cc for a total of 300 / 535 cc Left: 50 cc for a total of 300 / 535 cc

## 2019-03-22 ENCOUNTER — Encounter: Payer: Self-pay | Admitting: Hematology

## 2019-03-23 ENCOUNTER — Telehealth: Payer: Self-pay | Admitting: *Deleted

## 2019-03-23 NOTE — Telephone Encounter (Signed)
Telephone call to pt for patient education for taxotere & cytoxan.  Pt requested telephone education due to special needs child.  Pt will pick up written information at next OV.  Questions answered.

## 2019-03-24 ENCOUNTER — Telehealth: Payer: Self-pay | Admitting: Plastic Surgery

## 2019-03-24 NOTE — Telephone Encounter (Signed)
Called patient to confirm appointment scheduled for tomorrow. Patient answered the following questions: °1.Has the patient traveled outside of the state of [place] at all within the past 6 weeks? No °2.Does the patient have a fever or cough at all? No °3.Has the patient been tested for COVID? Had a positive COVID test? No °4. Has the patient been in contact with anyone who has tested positive? No ° °

## 2019-03-25 ENCOUNTER — Other Ambulatory Visit: Payer: Self-pay

## 2019-03-25 ENCOUNTER — Inpatient Hospital Stay: Payer: BLUE CROSS/BLUE SHIELD

## 2019-03-25 ENCOUNTER — Encounter: Payer: Self-pay | Admitting: Plastic Surgery

## 2019-03-25 ENCOUNTER — Telehealth: Payer: Self-pay | Admitting: Physical Therapy

## 2019-03-25 ENCOUNTER — Ambulatory Visit (INDEPENDENT_AMBULATORY_CARE_PROVIDER_SITE_OTHER): Payer: BLUE CROSS/BLUE SHIELD | Admitting: Plastic Surgery

## 2019-03-25 VITALS — BP 106/72 | HR 69 | Temp 98.7°F | Ht 63.0 in | Wt 171.0 lb

## 2019-03-25 DIAGNOSIS — Z9013 Acquired absence of bilateral breasts and nipples: Secondary | ICD-10-CM

## 2019-03-25 DIAGNOSIS — C50411 Malignant neoplasm of upper-outer quadrant of right female breast: Secondary | ICD-10-CM

## 2019-03-25 DIAGNOSIS — Z170 Estrogen receptor positive status [ER+]: Secondary | ICD-10-CM

## 2019-03-25 NOTE — Telephone Encounter (Signed)
Phoned pt to talk about her appointment scheduled for 04/05/2019. We discussed whether she felt it was urgent that she come in to the clinic since we are trying to limit outpatient visits due to [place]. She did not feel it was urgent. We discussed the importance of regular exercise as she goes through chemo. Encouraged her to use her recumbant bike since her knee is flared up from walking. I also educated her on a progression of her HEP to achieve the past few degrees of shoulder ROM as she felt tight at end range. MedBridge exercises were texted to her per her request after phone education occurred. Those are documented in her last encounter. She had no other questions but knows to contact us with any concerns. Annia Friendly, [place] 03/25/19 2:25 PM

## 2019-03-25 NOTE — Progress Notes (Signed)
   Subjective:    Patient ID: [name], female    DOB: [birth year], 53 y.o.   MRN: [id number]  The patient is a 53 year old breast reconstruction.  She has been doing very well with expansion.  She is happy with it she is thinking that she would like to move placement with the implants she will start chemotherapy next week.  This will go until June.  She would like to have the exchange prior to radiation.  This should be possible.  Incisions are healing well with no sign of infection or seroma.   Review of Systems  Constitutional: Negative.   HENT: Negative.   Eyes: Negative.   Respiratory: Negative.   Cardiovascular: Negative.   Gastrointestinal: Negative.   Genitourinary: Negative.   Musculoskeletal: Negative.   Psychiatric/Behavioral: Negative.        Objective:   Physical Exam Vitals signs and nursing note reviewed.  Constitutional:      Appearance: Normal appearance.  HENT:     Head: Normocephalic and atraumatic.  Cardiovascular:     Rate and Rhythm: Normal rate.  Pulmonary:     Effort: Pulmonary effort is normal. No respiratory distress.  Neurological:     Mental Status: She is alert.  Psychiatric:        Mood and Affect: Mood normal.        Behavior: Behavior normal.        Assessment & Plan:  Acquired absence of bilateral breasts and nipples  Malignant neoplasm of upper-outer quadrant of right breast in female, estrogen receptor positive (HCC) We placed injectable saline in the Expander using a sterile technique: Right: 50 cc for a total of 350 / 535 cc Left: 50 cc for a total of 350 / 535 cc

## 2019-03-29 ENCOUNTER — Telehealth: Payer: Self-pay | Admitting: *Deleted

## 2019-03-29 NOTE — Telephone Encounter (Signed)
Received call from patient stating that her right knee has started swelling and becoming painful.  She had knee surgery last June (2019) to repair the meniscus.  She has been skating and exercising. She states she does have arthritis in that knee as well.  She has been elevating it, icing, heat and taking some hydrocodone.  Since she has not started chemo, instructed her to alternate tylenol or tylenol arthritis and ibuprofen.  Or she could use the hydrocodone and ibuprofen and alternate them. I will send Dr. Burr Medico a message concerning this and give the patient an update.  Instructed her to continue to elevate, use ice and heat as well.  Patient verbalized understanding.

## 2019-03-30 ENCOUNTER — Ambulatory Visit: Payer: BLUE CROSS/BLUE SHIELD | Admitting: Plastic Surgery

## 2019-03-30 ENCOUNTER — Telehealth: Payer: Self-pay | Admitting: *Deleted

## 2019-03-30 NOTE — Telephone Encounter (Signed)
Spoke with patient to follow up regarding her knee pain.  She states it is better today and that the hydrocodone and ibuprofen was helpful.  She doesn't think her orthopedic doctor are doing any type of visits right now but she is going to check. She does not want to delay her chemo because she has already made arrangements for her special needs daughter while she is receiving chemo.  She will see Dr. Burr Medico before her treatment on Thursday.

## 2019-03-31 ENCOUNTER — Other Ambulatory Visit: Payer: Self-pay | Admitting: Hematology

## 2019-03-31 DIAGNOSIS — Z170 Estrogen receptor positive status [ER+]: Principal | ICD-10-CM

## 2019-03-31 DIAGNOSIS — C50411 Malignant neoplasm of upper-outer quadrant of right female breast: Secondary | ICD-10-CM

## 2019-03-31 NOTE — Progress Notes (Signed)
[place]   [phone] [phone]   Clinic Follow up Note   Patient Care Team: Merwyn Katos as PCP - General (Physician Assistant) Jovita Kussmaul, MD as Consulting Physician (General Surgery) Truitt Merle, MD as Consulting Physician (Hematology) Gery Pray, MD as Consulting Physician (Radiation Oncology) Rockwell Germany, RN as Oncology Nurse Navigator Mauro Kaufmann, RN as Oncology Nurse Navigator  Date of Service:  04/01/2019  CHIEF COMPLAINT:  f/u of right breast cancer  SUMMARY OF ONCOLOGIC HISTORY: Oncology History   Cancer Staging Malignant neoplasm of upper-outer quadrant of right breast in female, estrogen receptor positive ([place]) Staging form: Breast, AJCC 8th Edition - Clinical stage from 01/15/2019: Stage IA (cT1b, cN0, cM0, G1, ER+, PR-, HER2-) - Signed by Truitt Merle, MD on 01/26/2019 - Pathologic stage from 02/24/2019: Stage IB (pT1a, pN1a, cM0, G1, ER+, PR-, HER2-) - Signed by Truitt Merle, MD on 03/18/2019       Malignant neoplasm of upper-outer quadrant of right breast in female, estrogen receptor positive ([place])   01/15/2019 Cancer Staging    Staging form: Breast, AJCC 8th Edition - Clinical stage from 01/15/2019: Stage IA (cT1b, cN0, cM0, G1, ER+, PR-, HER2-) - Signed by Truitt Merle, MD on 01/26/2019    01/15/2019 Mammogram    Diagnostic Mammogram 01/15/19  IMPRESSION Targeted ultrasound is performed, showing right breast 10 o'clock 5 cm from the nipple hypoechoic round mass measuring 0.7 x 0.5 x 0.6 cm. Adjacent to it there is a probable complicated cyst measuring 4 mm. There is no evidence of right axillary lymphadenopathy.    01/15/2019 Initial Biopsy    Diagnosis 01/15/19  Breast, right, needle core biopsy, upper outer - INVASIVE AND IN SITU MAMMARY CARCINOMA WITH CALCIFICATIONS. - SEE COMMENT. 1 of 3 FINAL for [place], Catrena P ([id number]) Microscopic Comment The carcinoma appears grade I. The malignant cells  are positive for cytokeratin AE1/AE3 and negative for E-cadherin. A breast prognostic profile will be performed and the results reported separately. Dr Vicente Males has reviewed the case and concurs with this interpretation. The results are called to [facility] on 01/18/2019. (JBK:ah:ecj 01/18/19)    01/15/2019 Receptors her2    PROGNOSTIC INDICATORS Results: IMMUNOHISTOCHEMICAL AND MORPHOMETRIC ANALYSIS PERFORMED MANUALLY The tumor cells are NEGATIVE for Her2 (0). Estrogen Receptor: 10%, POSITIVE, MODERATE STAINING INTENSITY Progesterone Receptor: 0%, NEGATIVE Proliferation Marker Ki67: <1%    01/21/2019 Initial Diagnosis    Malignant neoplasm of upper-outer quadrant of right breast in female, estrogen receptor positive ([place])    01/29/2019 Breast MRI    IMPRESSION: 1. The biopsy-proven site of malignancy in the upper-outer quadrant of the right breast resides centrally with in an area of suspicious non mass enhancement spanning 6-7 cm.  2. There is a 6 mm suspicious enhancing mass in the lower-inner quadrant of the right breast, posterior depth.  3. There is a 9 mm indeterminate enhancing mass in the lower outer quadrant of the right breast.  4.  No evidence of malignancy in the left breast.    02/24/2019 Surgery    RIGHT MASTECTOMY WITH SENTINEL LYMPH NODE MAPPING AND LEFT PROPHYLACTIC MASTECTOMY by Dr. Marlou Starks  BREAST RECONSTRUCTION WITH PLACEMENT OF TISSUE EXPANDER by Dr. Marla Roe  02/24/19     02/24/2019 Pathology Results    Diagnosis 1. Breast, simple mastectomy, Left - BENIGN BREAST PARENCHYMA WITH FIBROCYSTIC CHANGE. - NEGATIVE FOR CARCINOMA. 2. Breast, simple mastectomy, Right - LOBULAR CARCINOMA IN SITU, INTERMEDIATE TO HIGH NUCLEAR  GRADE. SEE NOTE. - NO EVIDENCE OF RESIDUAL INVASIVE CARCINOMA. - RESECTION MARGINS ARE NEGATIVE FOR IN SITU OR INVASIVE CARCINOMA. - BIOPSY SITE CHANGES. - SEE ONCOLOGY TABLE. 3. Lymph node, sentinel, biopsy, Right #1 -  IMMUNOHISTOCHEMICAL STAIN FOR AE1/AE3 IS NEGATIVE FOR CARCINOMA (0/1). 4. Lymph node, sentinel, biopsy, Right - IMMUNOHISTOCHEMICAL STAIN FOR AE1/AE3 IS NEGATIVE FOR CARCINOMA (0/1). 5. Lymph node, sentinel, biopsy, Right #2 - IMMUNOHISTOCHEMICAL STAIN FOR AE1/AE3 IS NEGATIVE FOR CARCINOMA (0/1). 6. Lymph node, sentinel, biopsy, Right - IMMUNOHISTOCHEMICAL STAIN FOR AE1/AE3 IS NEGATIVE FOR CARCINOMA (0/1). 7. Lymph node, sentinel, biopsy, Right - METASTATIC CARCINOMA TO ONE LYMPH NODE (1/1) (CONFIRMED WITH IMMUNOHISTOCHEMICAL STAIN FOR AE1/AE3) - FOCUS OF METASTATIC CARCINOMA MEASURES JUST OVER 0.2 CM IN GREATEST DIMENSION - NO EVIDENCE OF EXTRANODAL EXTENSION    02/24/2019 Cancer Staging    Staging form: Breast, AJCC 8th Edition - Pathologic stage from 02/24/2019: Stage IB (pT1a, pN1a, cM0, G1, ER+, PR-, HER2-) - Signed by Truitt Merle, MD on 03/18/2019    04/01/2019 -  Chemotherapy    adjuvant Taxol and Carboplatin every 3 weeks for 4 cycles starting in 2 weeks starting 04/01/19      CURRENT THERAPY:  adjuvant Docetaxel and Cytoxan every 3 weeks for 4 cycles starting today 04/01/2019  INTERVAL HISTORY:  [name] is here for a follow up and start of adjuvant chemo TC. She presents to the clinic today by herself. She notes she is doing well. She has been having right knee pain with swelling. This is related to arthritis and prior surgery. The pain and swelling has mostly subsided. She notes she took steroid premeds yesterday and then developed skin redness around her nose and her breast incision. She has taken steroids before with no issue. She completed chemo class and feels ready for chemo to start but has questions.   REVIEW OF SYSTEMS:   Constitutional: Denies fevers, chills or abnormal weight loss Eyes: Denies blurriness of vision Ears, nose, mouth, throat, and face: Denies mucositis or sore throat Respiratory: Denies cough, dyspnea or wheezes Cardiovascular: Denies palpitation,  chest discomfort or lower extremity swelling Gastrointestinal:  Denies nausea, heartburn or change in bowel habits Skin: Denies abnormal skin rashes (+) skin erythema of nose and breast incision MSK: (+) Right knee  Lymphatics: Denies new lymphadenopathy or easy bruising Neurological:Denies numbness, tingling or new weaknesses Behavioral/Psych: Mood is stable, no new changes  All other systems were reviewed with the patient and are negative.  MEDICAL HISTORY:  Past Medical History:  Diagnosis Date   Anemia    Arthritis    Cancer ([place])    right breast   Family history of adverse reaction to anesthesia    sister has same issues   GERD (gastroesophageal reflux disease)    PONV (postoperative nausea and vomiting)     SURGICAL HISTORY: Past Surgical History:  Procedure Laterality Date   BREAST RECONSTRUCTION WITH PLACEMENT OF TISSUE EXPANDER AND FLEX HD (ACELLULAR HYDRATED DERMIS) Bilateral 02/24/2019   Procedure: BREAST RECONSTRUCTION WITH PLACEMENT OF TISSUE EXPANDER AND FLEX HD (ACELLULAR HYDRATED DERMIS);  Surgeon: Wallace Going, DO;  Location: [facility];  Service: Plastics;  Laterality: Bilateral;   BREAST SURGERY     left breast bx   EYE SURGERY     dart to the eye  (right)   FACIAL FRACTURE SURGERY     FRACTURE SURGERY     KNEE SURGERY     MASTECTOMY W/ SENTINEL NODE BIOPSY Bilateral 02/24/2019   Procedure: RIGHT  MASTECTOMY WITH SENTINEL LYMPH NODE MAPPING AND LEFT PROPHYLACTIC MASTECTOMY;  Surgeon: Jovita Kussmaul, MD;  Location: [facility];  Service: General;  Laterality: Bilateral;   SHOULDER SURGERY      I have reviewed the social history and family history with the patient and they are unchanged from previous note.  ALLERGIES:  is allergic to codeine and tramadol.  MEDICATIONS:  Current Outpatient Medications  Medication Sig Dispense Refill   acetaminophen (TYLENOL) 500 MG tablet Take 1,000 mg by mouth every 6 (six) hours as needed for moderate pain.       Cholecalciferol (VITAMIN D3) 125 MCG (5000 UT) CAPS Take 5,000 Units by mouth daily.     Cyanocobalamin (B-12) 2500 MCG TABS Take 2,500 mcg by mouth daily.     cyclobenzaprine (FLEXERIL) 10 MG tablet Take 1 tablet (10 mg total) by mouth 2 (two) times daily as needed for muscle spasms. 30 tablet 0   dexamethasone (DECADRON) 4 MG tablet Take 2 tablets (8 mg total) by mouth 2 (two) times daily. Start the day before Taxotere. Then 1 tab twice daily the day after chemo for 3 days. 30 tablet 1   diazepam (VALIUM) 2 MG tablet Take 1 tablet (2 mg total) by mouth every 6 (six) hours as needed for anxiety. 20 tablet 0   Diclofenac Sodium CR 100 MG 24 hr tablet Take 1 tablet (100 mg total) by mouth daily. 5 tablet 0   docusate sodium (COLACE) 100 MG capsule Take 100 mg by mouth daily.     FLUoxetine (PROZAC) 10 MG capsule Take 20 mg by mouth daily.      Iron-Vitamin C 65-125 MG TABS Take 1 tablet by mouth daily.     Multiple Vitamin (MULTIVITAMIN WITH MINERALS) TABS tablet Take 1 tablet by mouth daily.     omeprazole (PRILOSEC) 20 MG capsule Take 20 mg by mouth daily.     ondansetron (ZOFRAN) 8 MG tablet Take 1 tablet (8 mg total) by mouth 2 (two) times daily as needed for refractory nausea / vomiting. Start on day 3 after chemo. 30 tablet 1   prochlorperazine (COMPAZINE) 10 MG tablet Take 1 tablet (10 mg total) by mouth every 6 (six) hours as needed (Nausea or vomiting). 30 tablet 1   ibuprofen (ADVIL,MOTRIN) 200 MG tablet Take 600 mg by mouth every 6 (six) hours as needed for moderate pain.      No current facility-administered medications for this visit.    Facility-Administered Medications Ordered in Other Visits  Medication Dose Route Frequency Provider Last Rate Last Dose   cyclophosphamide (CYTOXAN) 1,120 mg in sodium chloride 0.9 % 250 mL chemo infusion  600 mg/m2 (Treatment Plan Recorded) Intravenous Once Truitt Merle, MD       dexamethasone (DECADRON) injection 10 mg  10 mg  Intravenous Once Truitt Merle, MD       DOCEtaxel (TAXOTERE) 140 mg in sodium chloride 0.9 % 250 mL chemo infusion  75 mg/m2 (Treatment Plan Recorded) Intravenous Once Truitt Merle, MD       palonosetron (ALOXI) injection 0.25 mg  0.25 mg Intravenous Once Truitt Merle, MD       pegfilgrastim (NEULASTA ONPRO KIT) injection 6 mg  6 mg Subcutaneous Once Truitt Merle, MD        PHYSICAL EXAMINATION: ECOG PERFORMANCE STATUS: 1 - Symptomatic but completely ambulatory  Vitals:   04/01/19 0926  BP: 125/64  Pulse: 73  Resp: 18  Temp: 98.1 F (36.7 C)  SpO2: 100%   Filed  Weights   04/01/19 0926  Weight: 172 lb 11.2 oz (78.3 kg)    GENERAL:alert, no distress and comfortable SKIN: skin color, texture, turgor are normal, no rashes or significant lesions EYES: normal, Conjunctiva are pink and non-injected, sclera clear OROPHARYNX:no exudate, no erythema and lips, buccal mucosa, and tongue normal  NECK: supple, thyroid normal size, non-tender, without nodularity LYMPH:  no palpable lymphadenopathy in the cervical, axillary or inguinal LUNGS: clear to auscultation and percussion with normal breathing effort HEART: regular rate & rhythm and no murmurs and no lower extremity edema ABDOMEN:abdomen soft, non-tender and normal bowel sounds Musculoskeletal:no cyanosis of digits and no clubbing  NEURO: alert & oriented x 3 with fluent speech, no focal motor/sensory deficits BREAST: S/p b/l mastectomy: Surgical incisions healed well, mild skin erythema at incision site   LABORATORY DATA:  I have reviewed the data as listed CBC Latest Ref Rng & Units 04/01/2019 03/05/2019 02/18/2019  WBC 4.0 - 10.5 K/uL 15.1(H) 15.1(H) 6.7  Hemoglobin 12.0 - 15.0 g/dL 11.3(L) 11.0(L) 11.4(L)  Hematocrit 36.0 - 46.0 % 36.1 34.7(L) 35.3(L)  Platelets 150 - 400 K/uL 275 384 308     CMP Latest Ref Rng & Units 04/01/2019 03/05/2019 02/18/2019  Glucose 70 - 99 mg/dL 87 113(H) 92  BUN 6 - 20 mg/dL '13 9 10  ' Creatinine 0.44 - 1.00  mg/dL 0.76 0.62 0.67  Sodium 135 - 145 mmol/L 137 139 138  Potassium 3.5 - 5.1 mmol/L 3.5 3.6 4.9  Chloride 98 - 111 mmol/L 103 104 103  CO2 22 - 32 mmol/L '23 26 26  ' Calcium 8.9 - 10.3 mg/dL 10.0 9.6 10.1  Total Protein 6.5 - 8.1 g/dL 7.9 6.8 -  Total Bilirubin 0.3 - 1.2 mg/dL 0.3 0.4 -  Alkaline Phos 38 - 126 U/L 75 75 -  AST 15 - 41 U/L 22 29 -  ALT 0 - 44 U/L 21 26 -      RADIOGRAPHIC STUDIES: I have personally reviewed the radiological images as listed and agreed with the findings in the report. No results found.   ASSESSMENT & PLAN:  [name] is a 53 y.o. female with   1.Malignant neoplasm of upper-outer quadrant of right breast,invasive lobular carcinoma, stageIA, p(T1a,N1a,M0), ER 10%+, PR-, HER2-Grade I  -She was recently diagnosed in 12/2018. She is s/p b/l mastectomy on 02/24/19.  -I discussed the pathology results with her in great detail which shows her small tumor was removed by biopsy alone and there were no cancer cells found in her mastectomy sample. Her margins were negative, however she has 1/5 positive lymph nodes.  -Unfortunately there was insufficient sample from tumor to complete Mammaprint to predict her risk of recurrence.  -Given her PR/HER2 negative with low ER positive disease and positive lymph node the biology of the tumor indicates it is likely more aggressive and potential moderate to higher risk of recurrence.  -She will start adjuvant chemo with TC every 3 weeks for 4 cycles today. We reviewed potential AEs and management again, and she agrees to proceed. -Her plastic surgeon Dr. Marla Roe would like to do reconstruction surgery before radiation. I will send note to Dr. Sondra Come to discuss her reconstruction options.   -I discussed her risk of contracting COVID-19 when on chemo. I reviewed COVID-19 precautions and encouraged her to continue social distancing.  -I answered all her questions to her understanding and satisfaction.  -Labs revewied,  CBC and CMP WNL except WBC 15.1, Hg 11.3, ANC 11.8. Ca 27.29  is still pending. Overall adequate to proceed with start of TC today. She will use Onpro patch to combat low blood counts.  -F/u with E-visit in 1 week. I encouraged her to contact us for any unexpected or significant side effects.  -return in 3 weeks for cycle 2 chemo   2. Genetic Testing  -She does note some family history for malignancy so I will see if her insurance will cover testing as she is interested.   3. Social support  -She takes care of her daughter full time who is on total care and ventilator  -she has a Marine scientist for 5 days of total care for her daughter.  -She has good family support  4. Right knee pain, osteoarthritis   -Musculoskeletal/arthritic pain  -Improved lately  -I refilled her flexeril today (04/01/19), she will continue Tylenol for arthritis as well.    PLAN: -I refilled flexeril today  -I will send note to Dr. Marla Roe and Dr. Sondra Come about reconstruction surgery and radiation  -Labs reviewed and adequate to proceed with Cycle 1 TC today  -Lab, f/u and treatment in 3 weeks  -E-visit in 1 week for toxicity check    No problem-specific Assessment & Plan notes found for this encounter.   No orders of the defined types were placed in this encounter.  All questions were answered. The patient knows to call the clinic with any problems, questions or concerns. No barriers to learning was detected. I spent 20 minutes counseling the patient face to face. The total time spent in the appointment was 25 minutes and more than 50% was on counseling and review of test results     Truitt Merle, MD 04/01/2019   I, Joslyn Devon, am acting as scribe for Truitt Merle, MD.   I have reviewed the above documentation for accuracy and completeness, and I agree with the above.

## 2019-04-01 ENCOUNTER — Encounter: Payer: Self-pay | Admitting: Hematology

## 2019-04-01 ENCOUNTER — Inpatient Hospital Stay: Payer: BLUE CROSS/BLUE SHIELD | Admitting: Hematology

## 2019-04-01 ENCOUNTER — Telehealth: Payer: Self-pay | Admitting: *Deleted

## 2019-04-01 ENCOUNTER — Other Ambulatory Visit: Payer: Self-pay

## 2019-04-01 ENCOUNTER — Telehealth: Payer: Self-pay | Admitting: Hematology

## 2019-04-01 ENCOUNTER — Inpatient Hospital Stay: Payer: BLUE CROSS/BLUE SHIELD

## 2019-04-01 VITALS — BP 125/64 | HR 73 | Temp 98.1°F | Resp 18 | Ht 63.0 in | Wt 172.7 lb

## 2019-04-01 VITALS — BP 112/70 | HR 73 | Temp 98.6°F | Resp 18

## 2019-04-01 DIAGNOSIS — Z7689 Persons encountering health services in other specified circumstances: Secondary | ICD-10-CM | POA: Diagnosis not present

## 2019-04-01 DIAGNOSIS — M1990 Unspecified osteoarthritis, unspecified site: Secondary | ICD-10-CM

## 2019-04-01 DIAGNOSIS — C50411 Malignant neoplasm of upper-outer quadrant of right female breast: Secondary | ICD-10-CM

## 2019-04-01 DIAGNOSIS — Z170 Estrogen receptor positive status [ER+]: Secondary | ICD-10-CM | POA: Diagnosis not present

## 2019-04-01 DIAGNOSIS — Z79899 Other long term (current) drug therapy: Secondary | ICD-10-CM

## 2019-04-01 DIAGNOSIS — M25561 Pain in right knee: Secondary | ICD-10-CM

## 2019-04-01 DIAGNOSIS — Z5111 Encounter for antineoplastic chemotherapy: Secondary | ICD-10-CM | POA: Diagnosis not present

## 2019-04-01 DIAGNOSIS — Z9013 Acquired absence of bilateral breasts and nipples: Secondary | ICD-10-CM

## 2019-04-01 DIAGNOSIS — K219 Gastro-esophageal reflux disease without esophagitis: Secondary | ICD-10-CM

## 2019-04-01 LAB — CMP (CANCER CENTER ONLY)
ALT: 21 U/L (ref 0–44)
AST: 22 U/L (ref 15–41)
Albumin: 4.2 g/dL (ref 3.5–5.0)
Alkaline Phosphatase: 75 U/L (ref 38–126)
Anion gap: 11 (ref 5–15)
BUN: 13 mg/dL (ref 6–20)
CO2: 23 mmol/L (ref 22–32)
Calcium: 10 mg/dL (ref 8.9–10.3)
Chloride: 103 mmol/L (ref 98–111)
Creatinine: 0.76 mg/dL (ref 0.44–1.00)
GFR, Est AFR Am: >60 mL/min (ref >60)
GFR, Estimated: >60 mL/min (ref >60)
Glucose, Bld: 87 mg/dL (ref 70–99)
Potassium: 3.5 mmol/L (ref 3.5–5.1)
Sodium: 137 mmol/L (ref 135–145)
Total Bilirubin: 0.3 mg/dL (ref 0.3–1.2)
Total Protein: 7.9 g/dL (ref 6.5–8.1)

## 2019-04-01 LAB — CBC WITH DIFFERENTIAL (CANCER CENTER ONLY)
Abs Immature Granulocytes: 0.06 10*3/uL (ref 0.00–0.07)
Basophils Absolute: 0 10*3/uL (ref 0.0–0.1)
Basophils Relative: 0 %
Eosinophils Absolute: 0 10*3/uL (ref 0.0–0.5)
Eosinophils Relative: 0 %
HCT: 36.1 % (ref 36.0–46.0)
Hemoglobin: 11.3 g/dL — ABNORMAL LOW (ref 12.0–15.0)
Immature Granulocytes: 0 %
Lymphocytes Relative: 11 %
Lymphs Abs: 1.7 10*3/uL (ref 0.7–4.0)
MCH: 29 pg (ref 26.0–34.0)
MCHC: 31.3 g/dL (ref 30.0–36.0)
MCV: 92.6 fL (ref 80.0–100.0)
Monocytes Absolute: 1.5 10*3/uL — ABNORMAL HIGH (ref 0.1–1.0)
Monocytes Relative: 10 %
Neutro Abs: 11.8 10*3/uL — ABNORMAL HIGH (ref 1.7–7.7)
Neutrophils Relative %: 79 %
Platelet Count: 275 10*3/uL (ref 150–400)
RBC: 3.9 MIL/uL (ref 3.87–5.11)
RDW: 13.2 % (ref 11.5–15.5)
WBC Count: 15.1 10*3/uL — ABNORMAL HIGH (ref 4.0–10.5)
nRBC: 0 % (ref 0.0–0.2)

## 2019-04-01 MED ORDER — PEGFILGRASTIM 6 MG/0.6ML ~~LOC~~ PSKT
PREFILLED_SYRINGE | SUBCUTANEOUS | Status: AC
  Filled 2019-04-01: qty 0.6

## 2019-04-01 MED ORDER — PEGFILGRASTIM 6 MG/0.6ML ~~LOC~~ PSKT
6.0000 mg | PREFILLED_SYRINGE | Freq: Once | SUBCUTANEOUS | Status: AC
  Administered 2019-04-01: 6 mg via SUBCUTANEOUS

## 2019-04-01 MED ORDER — DEXAMETHASONE SODIUM PHOSPHATE 10 MG/ML IJ SOLN
INTRAMUSCULAR | Status: AC
  Filled 2019-04-01: qty 1

## 2019-04-01 MED ORDER — CYCLOBENZAPRINE HCL 10 MG PO TABS: 10.0000 mg | ORAL_TABLET | Freq: Two times a day (BID) | ORAL | 0 refills | Status: DC | PRN

## 2019-04-01 MED ORDER — SODIUM CHLORIDE 0.9 % IV SOLN
Freq: Once | INTRAVENOUS | Status: AC
  Administered 2019-04-01: 10:00:00 via INTRAVENOUS
  Filled 2019-04-01: qty 250

## 2019-04-01 MED ORDER — SODIUM CHLORIDE 0.9 % IV SOLN
600.0000 mg/m2 | Freq: Once | INTRAVENOUS | Status: AC
  Administered 2019-04-01: 1120 mg via INTRAVENOUS
  Filled 2019-04-01: qty 56

## 2019-04-01 MED ORDER — PALONOSETRON HCL INJECTION 0.25 MG/5ML
0.2500 mg | Freq: Once | INTRAVENOUS | Status: AC
  Administered 2019-04-01: 0.25 mg via INTRAVENOUS

## 2019-04-01 MED ORDER — DEXAMETHASONE SODIUM PHOSPHATE 10 MG/ML IJ SOLN
10.0000 mg | Freq: Once | INTRAMUSCULAR | Status: AC
  Administered 2019-04-01: 10 mg via INTRAVENOUS

## 2019-04-01 MED ORDER — PALONOSETRON HCL INJECTION 0.25 MG/5ML
INTRAVENOUS | Status: AC
  Filled 2019-04-01: qty 5

## 2019-04-01 MED ORDER — SODIUM CHLORIDE 0.9 % IV SOLN
75.0000 mg/m2 | Freq: Once | INTRAVENOUS | Status: AC
  Administered 2019-04-01: 140 mg via INTRAVENOUS
  Filled 2019-04-01: qty 14

## 2019-04-01 NOTE — Telephone Encounter (Signed)
Discussed taxotere & cytoxan with pt in infusion room.  Provided written information of materials that were introduced on the phone earlier with patient education.  Questions answered.

## 2019-04-01 NOTE — Patient Instructions (Addendum)
[facility] Discharge Instructions for Patients Receiving Chemotherapy  Today you received the following chemotherapy agents Taxotere and Cytoxan  To help prevent nausea and vomiting after your treatment, we encourage you to take your nausea medication as directed.   If you develop nausea and vomiting that is not controlled by your nausea medication, call the clinic.   BELOW ARE SYMPTOMS THAT SHOULD BE REPORTED IMMEDIATELY:  *FEVER GREATER THAN 100.5 F  *CHILLS WITH OR WITHOUT FEVER  NAUSEA AND VOMITING THAT IS NOT CONTROLLED WITH YOUR NAUSEA MEDICATION  *UNUSUAL SHORTNESS OF BREATH  *UNUSUAL BRUISING OR BLEEDING  TENDERNESS IN MOUTH AND THROAT WITH OR WITHOUT PRESENCE OF ULCERS  *URINARY PROBLEMS  *BOWEL PROBLEMS  UNUSUAL RASH Items with * indicate a potential emergency and should be followed up as soon as possible.  Feel free to call the clinic should you have any questions or concerns. The clinic phone number is [phone].  Please show the [facility] at check-in to the Emergency Department and triage nurse.   Docetaxel injection (Taxotere) What is this medicine? DOCETAXEL (doe se TAX el) is a chemotherapy drug. It targets fast dividing cells, like cancer cells, and causes these cells to die. This medicine is used to treat many types of cancers like breast cancer, certain stomach cancers, head and neck cancer, lung cancer, and prostate cancer. This medicine may be used for other purposes; ask your health care provider or pharmacist if you have questions. COMMON BRAND NAME(S): Docefrez, Taxotere What should I tell my health care provider before I take this medicine? They need to know if you have any of these conditions: -infection (especially a virus infection such as chickenpox, cold sores, or herpes) -liver disease -low blood counts, like low white cell, platelet, or red cell counts -an unusual or allergic reaction to docetaxel, polysorbate 80,  other chemotherapy agents, other medicines, foods, dyes, or preservatives -pregnant or trying to get pregnant -breast-feeding How should I use this medicine? This drug is given as an infusion into a vein. It is administered in a hospital or clinic by a specially trained health care professional. Talk to your pediatrician regarding the use of this medicine in children. Special care may be needed. Overdosage: If you think you have taken too much of this medicine contact a poison control center or emergency room at once. NOTE: This medicine is only for you. Do not share this medicine with others. What if I miss a dose? It is important not to miss your dose. Call your doctor or health care professional if you are unable to keep an appointment. What may interact with this medicine? -cyclosporine -erythromycin -ketoconazole -medicines to increase blood counts like filgrastim, pegfilgrastim, sargramostim -vaccines Talk to your doctor or health care professional before taking any of these medicines: -acetaminophen -aspirin -ibuprofen -ketoprofen -naproxen This list may not describe all possible interactions. Give your health care provider a list of all the medicines, herbs, non-prescription drugs, or dietary supplements you use. Also tell them if you smoke, drink alcohol, or use illegal drugs. Some items may interact with your medicine. What should I watch for while using this medicine? Your condition will be monitored carefully while you are receiving this medicine. You will need important blood work done while you are taking this medicine. This drug may make you feel generally unwell. This is not uncommon, as chemotherapy can affect healthy cells as well as cancer cells. Report any side effects. Continue your course of treatment even though  you feel ill unless your doctor tells you to stop. In some cases, you may be given additional medicines to help with side effects. Follow all directions for  their use. Call your doctor or health care professional for advice if you get a fever, chills or sore throat, or other symptoms of a cold or flu. Do not treat yourself. This drug decreases your body's ability to fight infections. Try to avoid being around people who are sick. This medicine may increase your risk to bruise or bleed. Call your doctor or health care professional if you notice any unusual bleeding. This medicine may contain alcohol in the product. You may get drowsy or dizzy. Do not drive, use machinery, or do anything that needs mental alertness until you know how this medicine affects you. Do not stand or sit up quickly, especially if you are an older patient. This reduces the risk of dizzy or fainting spells. Avoid alcoholic drinks. Do not become pregnant while taking this medicine or for 6 months after stopping it. Women should inform their doctor if they wish to become pregnant or think they might be pregnant. Men should not father a child while taking this medicine and for 3 months after stopping it. There is a potential for serious side effects to an unborn child. Talk to your health care professional or pharmacist for more information. Do not breast-feed an infant while taking this medicine or for 2 weeks after stopping it. This may interfere with the ability to father a child. You should talk to your doctor or health care professional if you are concerned about your fertility. What side effects may I notice from receiving this medicine? Side effects that you should report to your doctor or health care professional as soon as possible: -allergic reactions like skin rash, itching or hives, swelling of the face, lips, or tongue -low blood counts - This drug may decrease the number of white blood cells, red blood cells and platelets. You may be at increased risk for infections and bleeding. -signs of infection - fever or chills, cough, sore throat, pain or difficulty passing urine -signs  of decreased platelets or bleeding - bruising, pinpoint red spots on the skin, black, tarry stools, nosebleeds -signs of decreased red blood cells - unusually weak or tired, fainting spells, lightheadedness -breathing problems -fast or irregular heartbeat -low blood pressure -mouth sores -nausea and vomiting -pain, swelling, redness or irritation at the injection site -pain, tingling, numbness in the hands or feet -swelling of the ankle, feet, hands -weight gain Side effects that usually do not require medical attention (report to your doctor or health care professional if they continue or are bothersome): -bone pain -complete hair loss including hair on your head, underarms, pubic hair, eyebrows, and eyelashes -diarrhea -excessive tearing -changes in the color of fingernails -loosening of the fingernails -nausea -muscle pain -red flush to skin -sweating -weak or tired This list may not describe all possible side effects. Call your doctor for medical advice about side effects. You may report side effects to FDA at 1-800-FDA-1088. Where should I keep my medicine? This drug is given in a hospital or clinic and will not be stored at home. NOTE: This sheet is a summary. It may not cover all possible information. If you have questions about this medicine, talk to your doctor, pharmacist, or health care provider.  2019 Elsevier/Gold Standard (2017-12-29 12:07:21)  Cyclophosphamide (Cytoxan) injection What is this medicine? CYCLOPHOSPHAMIDE (sye kloe FOSS fa mide) is a chemotherapy drug.  It slows the growth of cancer cells. This medicine is used to treat many types of cancer like lymphoma, myeloma, leukemia, breast cancer, and ovarian cancer, to name a few. This medicine may be used for other purposes; ask your health care provider or pharmacist if you have questions. COMMON BRAND NAME(S): Cytoxan, Neosar What should I tell my health care provider before I take this medicine? They need  to know if you have any of these conditions: -blood disorders -history of other chemotherapy -infection -kidney disease -liver disease -recent or ongoing radiation therapy -tumors in the bone marrow -an unusual or allergic reaction to cyclophosphamide, other chemotherapy, other medicines, foods, dyes, or preservatives -pregnant or trying to get pregnant -breast-feeding How should I use this medicine? This drug is usually given as an injection into a vein or muscle or by infusion into a vein. It is administered in a hospital or clinic by a specially trained health care professional. Talk to your pediatrician regarding the use of this medicine in children. Special care may be needed. Overdosage: If you think you have taken too much of this medicine contact a poison control center or emergency room at once. NOTE: This medicine is only for you. Do not share this medicine with others. What if I miss a dose? It is important not to miss your dose. Call your doctor or health care professional if you are unable to keep an appointment. What may interact with this medicine? This medicine may interact with the following medications: -amiodarone -amphotericin B -azathioprine -certain antiviral medicines for HIV or AIDS such as protease inhibitors (e.g., indinavir, ritonavir) and zidovudine -certain blood pressure medications such as benazepril, captopril, enalapril, fosinopril, lisinopril, moexipril, monopril, perindopril, quinapril, ramipril, trandolapril -certain cancer medications such as anthracyclines (e.g., daunorubicin, doxorubicin), busulfan, cytarabine, paclitaxel, pentostatin, tamoxifen, trastuzumab -certain diuretics such as chlorothiazide, chlorthalidone, hydrochlorothiazide, indapamide, metolazone -certain medicines that treat or prevent blood clots like warfarin -certain muscle relaxants such as succinylcholine -cyclosporine -etanercept -indomethacin -medicines to increase blood  counts like filgrastim, pegfilgrastim, sargramostim -medicines used as general anesthesia -metronidazole -natalizumab This list may not describe all possible interactions. Give your health care provider a list of all the medicines, herbs, non-prescription drugs, or dietary supplements you use. Also tell them if you smoke, drink alcohol, or use illegal drugs. Some items may interact with your medicine. What should I watch for while using this medicine? Visit your doctor for checks on your progress. This drug may make you feel generally unwell. This is not uncommon, as chemotherapy can affect healthy cells as well as cancer cells. Report any side effects. Continue your course of treatment even though you feel ill unless your doctor tells you to stop. Drink water or other fluids as directed. Urinate often, even at night. In some cases, you may be given additional medicines to help with side effects. Follow all directions for their use. Call your doctor or health care professional for advice if you get a fever, chills or sore throat, or other symptoms of a cold or flu. Do not treat yourself. This drug decreases your body's ability to fight infections. Try to avoid being around people who are sick. This medicine may increase your risk to bruise or bleed. Call your doctor or health care professional if you notice any unusual bleeding. Be careful brushing and flossing your teeth or using a toothpick because you may get an infection or bleed more easily. If you have any dental work done, tell your dentist you are receiving this  medicine. You may get drowsy or dizzy. Do not drive, use machinery, or do anything that needs mental alertness until you know how this medicine affects you. Do not become pregnant while taking this medicine or for 1 year after stopping it. Women should inform their doctor if they wish to become pregnant or think they might be pregnant. Men should not father a child while taking this  medicine and for 4 months after stopping it. There is a potential for serious side effects to an unborn child. Talk to your health care professional or pharmacist for more information. Do not breast-feed an infant while taking this medicine. This medicine may interfere with the ability to have a child. This medicine has caused ovarian failure in some women. This medicine has caused reduced sperm counts in some men. You should talk with your doctor or health care professional if you are concerned about your fertility. If you are going to have surgery, tell your doctor or health care professional that you have taken this medicine. What side effects may I notice from receiving this medicine? Side effects that you should report to your doctor or health care professional as soon as possible: -allergic reactions like skin rash, itching or hives, swelling of the face, lips, or tongue -low blood counts - this medicine may decrease the number of white blood cells, red blood cells and platelets. You may be at increased risk for infections and bleeding. -signs of infection - fever or chills, cough, sore throat, pain or difficulty passing urine -signs of decreased platelets or bleeding - bruising, pinpoint red spots on the skin, black, tarry stools, blood in the urine -signs of decreased red blood cells - unusually weak or tired, fainting spells, lightheadedness -breathing problems -dark urine -dizziness -palpitations -swelling of the ankles, feet, hands -trouble passing urine or change in the amount of urine -weight gain -yellowing of the eyes or skin Side effects that usually do not require medical attention (report to your doctor or health care professional if they continue or are bothersome): -changes in nail or skin color -hair loss -missed menstrual periods -mouth sores -nausea, vomiting This list may not describe all possible side effects. Call your doctor for medical advice about side effects.  You may report side effects to FDA at 1-800-FDA-1088. Where should I keep my medicine? This drug is given in a hospital or clinic and will not be stored at home. NOTE: This sheet is a summary. It may not cover all possible information. If you have questions about this medicine, talk to your doctor, pharmacist, or health care provider.  2019 [employer]vier/Gold Standard (2012-10-16 16:22:58)

## 2019-04-01 NOTE — Telephone Encounter (Signed)
Called and scheduled appt per 4/16 los.  Patient is aware that her appt on 4/23 is a phone visit.

## 2019-04-02 ENCOUNTER — Telehealth: Payer: Self-pay | Admitting: *Deleted

## 2019-04-02 LAB — CANCER ANTIGEN 27.29: CA 27.29: 34.3 U/mL (ref 0.0–38.6)

## 2019-04-02 NOTE — Telephone Encounter (Signed)
Spoke with patient to follow up after her 1st cycle of TC.  Patient states she is doing well with no complaints.  She states her knee that was giving her trouble feels better.  Encouraged her to call with any problems or concerns.  Patient verbalized understanding.

## 2019-04-03 ENCOUNTER — Inpatient Hospital Stay: Payer: BLUE CROSS/BLUE SHIELD

## 2019-04-05 ENCOUNTER — Ambulatory Visit (HOSPITAL_BASED_OUTPATIENT_CLINIC_OR_DEPARTMENT_OTHER): Payer: BLUE CROSS/BLUE SHIELD | Admitting: Hematology

## 2019-04-05 ENCOUNTER — Encounter: Payer: Self-pay | Admitting: Hematology

## 2019-04-05 ENCOUNTER — Telehealth: Payer: Self-pay | Admitting: *Deleted

## 2019-04-05 ENCOUNTER — Encounter: Payer: BLUE CROSS/BLUE SHIELD | Admitting: Physical Therapy

## 2019-04-05 DIAGNOSIS — K219 Gastro-esophageal reflux disease without esophagitis: Secondary | ICD-10-CM | POA: Diagnosis not present

## 2019-04-05 DIAGNOSIS — Z9013 Acquired absence of bilateral breasts and nipples: Secondary | ICD-10-CM

## 2019-04-05 DIAGNOSIS — C50411 Malignant neoplasm of upper-outer quadrant of right female breast: Secondary | ICD-10-CM

## 2019-04-05 DIAGNOSIS — M1711 Unilateral primary osteoarthritis, right knee: Secondary | ICD-10-CM

## 2019-04-05 DIAGNOSIS — Z170 Estrogen receptor positive status [ER+]: Secondary | ICD-10-CM | POA: Diagnosis not present

## 2019-04-05 MED ORDER — ZOLPIDEM TARTRATE 5 MG PO TABS: 5.0000 mg | ORAL_TABLET | Freq: Every evening | ORAL | 0 refills | Status: DC | PRN

## 2019-04-05 MED ORDER — VENLAFAXINE HCL ER 75 MG PO CP24: 75.0000 mg | ORAL_CAPSULE | Freq: Every day | ORAL | 1 refills | Status: DC

## 2019-04-05 NOTE — Progress Notes (Signed)
[place]   [phone] [phone]   Clinic Follow up Note   Patient Care Team: Merwyn Katos as PCP - General (Physician Assistant) Jovita Kussmaul, MD as Consulting Physician (General Surgery) Truitt Merle, MD as Consulting Physician (Hematology) Gery Pray, MD as Consulting Physician (Radiation Oncology) Rockwell Germany, RN as Oncology Nurse Navigator Mauro Kaufmann, RN as Oncology Nurse Navigator   I connected with [name] on 04/05/2019 at  3:30 PM EDT by telephone visit and verified that I am speaking with the correct person using two identifiers.   I discussed the limitations, risks, security and privacy concerns of performing an evaluation and management service by telephone and the availability of in person appointments. I also discussed with the patient that there may be a patient responsible charge related to this service. The patient expressed understanding and agreed to proceed.   Patient's location:  Her home  Provider's location:  My Office  CHIEF COMPLAINT: F/u of rigth breast cancer  SUMMARY OF ONCOLOGIC HISTORY: Oncology History   Cancer Staging Malignant neoplasm of upper-outer quadrant of right breast in female, estrogen receptor positive ([place]) Staging form: Breast, AJCC 8th Edition - Clinical stage from 01/15/2019: Stage IA (cT1b, cN0, cM0, G1, ER+, PR-, HER2-) - Signed by Truitt Merle, MD on 01/26/2019 - Pathologic stage from 02/24/2019: Stage IB (pT1a, pN1a, cM0, G1, ER+, PR-, HER2-) - Signed by Truitt Merle, MD on 03/18/2019       Malignant neoplasm of upper-outer quadrant of right breast in female, estrogen receptor positive ([place])   01/15/2019 Cancer Staging    Staging form: Breast, AJCC 8th Edition - Clinical stage from 01/15/2019: Stage IA (cT1b, cN0, cM0, G1, ER+, PR-, HER2-) - Signed by Truitt Merle, MD on 01/26/2019    01/15/2019 Mammogram    Diagnostic Mammogram 01/15/19  IMPRESSION Targeted ultrasound is  performed, showing right breast 10 o'clock 5 cm from the nipple hypoechoic round mass measuring 0.7 x 0.5 x 0.6 cm. Adjacent to it there is a probable complicated cyst measuring 4 mm. There is no evidence of right axillary lymphadenopathy.    01/15/2019 Initial Biopsy    Diagnosis 01/15/19  Breast, right, needle core biopsy, upper outer - INVASIVE AND IN SITU MAMMARY CARCINOMA WITH CALCIFICATIONS. - SEE COMMENT. 1 of 3 FINAL for Pottle, Sakia P ([id number]) Microscopic Comment The carcinoma appears grade I. The malignant cells are positive for cytokeratin AE1/AE3 and negative for E-cadherin. A breast prognostic profile will be performed and the results reported separately. Dr Vicente Males has reviewed the case and concurs with this interpretation. The results are called to [facility] on 01/18/2019. (JBK:ah:ecj 01/18/19)    01/15/2019 Receptors her2    PROGNOSTIC INDICATORS Results: IMMUNOHISTOCHEMICAL AND MORPHOMETRIC ANALYSIS PERFORMED MANUALLY The tumor cells are NEGATIVE for Her2 (0). Estrogen Receptor: 10%, POSITIVE, MODERATE STAINING INTENSITY Progesterone Receptor: 0%, NEGATIVE Proliferation Marker Ki67: <1%    01/21/2019 Initial Diagnosis    Malignant neoplasm of upper-outer quadrant of right breast in female, estrogen receptor positive ([place])    01/29/2019 Breast MRI    IMPRESSION: 1. The biopsy-proven site of malignancy in the upper-outer quadrant of the right breast resides centrally with in an area of suspicious non mass enhancement spanning 6-7 cm.  2. There is a 6 mm suspicious enhancing mass in the lower-inner quadrant of the right breast, posterior depth.  3. There is a 9 mm indeterminate enhancing mass in the lower outer quadrant of  the right breast.  4.  No evidence of malignancy in the left breast.    02/24/2019 Surgery    RIGHT MASTECTOMY WITH SENTINEL LYMPH NODE MAPPING AND LEFT PROPHYLACTIC MASTECTOMY by Dr. Marlou Starks  BREAST RECONSTRUCTION  WITH PLACEMENT OF TISSUE EXPANDER by Dr. Marla Roe  02/24/19     02/24/2019 Pathology Results    Diagnosis 1. Breast, simple mastectomy, Left - BENIGN BREAST PARENCHYMA WITH FIBROCYSTIC CHANGE. - NEGATIVE FOR CARCINOMA. 2. Breast, simple mastectomy, Right - LOBULAR CARCINOMA IN SITU, INTERMEDIATE TO HIGH NUCLEAR GRADE. SEE NOTE. - NO EVIDENCE OF RESIDUAL INVASIVE CARCINOMA. - RESECTION MARGINS ARE NEGATIVE FOR IN SITU OR INVASIVE CARCINOMA. - BIOPSY SITE CHANGES. - SEE ONCOLOGY TABLE. 3. Lymph node, sentinel, biopsy, Right #1 - IMMUNOHISTOCHEMICAL STAIN FOR AE1/AE3 IS NEGATIVE FOR CARCINOMA (0/1). 4. Lymph node, sentinel, biopsy, Right - IMMUNOHISTOCHEMICAL STAIN FOR AE1/AE3 IS NEGATIVE FOR CARCINOMA (0/1). 5. Lymph node, sentinel, biopsy, Right #2 - IMMUNOHISTOCHEMICAL STAIN FOR AE1/AE3 IS NEGATIVE FOR CARCINOMA (0/1). 6. Lymph node, sentinel, biopsy, Right - IMMUNOHISTOCHEMICAL STAIN FOR AE1/AE3 IS NEGATIVE FOR CARCINOMA (0/1). 7. Lymph node, sentinel, biopsy, Right - METASTATIC CARCINOMA TO ONE LYMPH NODE (1/1) (CONFIRMED WITH IMMUNOHISTOCHEMICAL STAIN FOR AE1/AE3) - FOCUS OF METASTATIC CARCINOMA MEASURES JUST OVER 0.2 CM IN GREATEST DIMENSION - NO EVIDENCE OF EXTRANODAL EXTENSION    02/24/2019 Cancer Staging    Staging form: Breast, AJCC 8th Edition - Pathologic stage from 02/24/2019: Stage IB (pT1a, pN1a, cM0, G1, ER+, PR-, HER2-) - Signed by Truitt Merle, MD on 03/18/2019    04/01/2019 -  Chemotherapy    adjuvant Taxol and Carboplatin every 3 weeks for 4 cycles starting in 2 weeks starting 04/01/19      CURRENT THERAPY:  adjuvantDocetaxel and Cytoxanevery 3 weeks for 4 cycles starting today 04/01/2019  INTERVAL HISTORY:  [name] is here for a follow up of treatment. She was able to identify herself by birth date.  She notes having HA and hot flashes at night. Her HA are daily almost constant (7-8/10). The light can effect this as well. She is taking Tylenol extra  strength. She feels steroids are keeping her awake which can be related to her HA. She has been taking Prozac 2-3 times a day. But is interested in Effexor.  She also notes Acid reflux. She is currently on Prilosec. She is taking Zofran and only needed it yesterday. She took Compazine 1-2 times this week.    REVIEW OF SYSTEMS:   Constitutional: Denies fevers, chills or abnormal weight loss (+) HA, constant (+) Hot flashes (+) insomnia  Eyes: Denies blurriness of vision Ears, nose, mouth, throat, and face: Denies mucositis or sore throat Respiratory: Denies cough, dyspnea or wheezes Cardiovascular: Denies palpitation, chest discomfort or lower extremity swelling Gastrointestinal:  Denies nausea, heartburn or change in bowel habits (+) Acid reflux  Skin: Denies abnormal skin rashes Lymphatics: Denies new lymphadenopathy or easy bruising Neurological:Denies numbness, tingling or new weaknesses Behavioral/Psych: Mood is stable, no new changes  All other systems were reviewed with the patient and are negative.  MEDICAL HISTORY:  Past Medical History:  Diagnosis Date  . Anemia   . Arthritis   . Cancer [facility])    right breast  . Family history of adverse reaction to anesthesia    sister has same issues  . GERD (gastroesophageal reflux disease)   . PONV (postoperative nausea and vomiting)     SURGICAL HISTORY: Past Surgical History:  Procedure Laterality Date  . BREAST RECONSTRUCTION WITH PLACEMENT  OF TISSUE EXPANDER AND FLEX HD (ACELLULAR HYDRATED DERMIS) Bilateral 02/24/2019   Procedure: BREAST RECONSTRUCTION WITH PLACEMENT OF TISSUE EXPANDER AND FLEX HD (ACELLULAR HYDRATED DERMIS);  Surgeon: Wallace Going, DO;  Location: [facility];  Service: Plastics;  Laterality: Bilateral;  . BREAST SURGERY     left breast bx  . EYE SURGERY     dart to the eye  (right)  . FACIAL FRACTURE SURGERY    . FRACTURE SURGERY    . KNEE SURGERY    . MASTECTOMY W/ SENTINEL NODE BIOPSY Bilateral 02/24/2019    Procedure: RIGHT MASTECTOMY WITH SENTINEL LYMPH NODE MAPPING AND LEFT PROPHYLACTIC MASTECTOMY;  Surgeon: Jovita Kussmaul, MD;  Location: [facility];  Service: General;  Laterality: Bilateral;  . SHOULDER SURGERY      I have reviewed the social history and family history with the patient and they are unchanged from previous note.  ALLERGIES:  is allergic to codeine and tramadol.  MEDICATIONS:  Current Outpatient Medications  Medication Sig Dispense Refill  . acetaminophen (TYLENOL) 500 MG tablet Take 1,000 mg by mouth every 6 (six) hours as needed for moderate pain.    . Cholecalciferol (VITAMIN D3) 125 MCG (5000 UT) CAPS Take 5,000 Units by mouth daily.    . Cyanocobalamin (B-12) 2500 MCG TABS Take 2,500 mcg by mouth daily.    . cyclobenzaprine (FLEXERIL) 10 MG tablet Take 1 tablet (10 mg total) by mouth 2 (two) times daily as needed for muscle spasms. 30 tablet 0  . dexamethasone (DECADRON) 4 MG tablet Take 2 tablets (8 mg total) by mouth 2 (two) times daily. Start the day before Taxotere. Then 1 tab twice daily the day after chemo for 3 days. 30 tablet 1  . diazepam (VALIUM) 2 MG tablet Take 1 tablet (2 mg total) by mouth every 6 (six) hours as needed for anxiety. 20 tablet 0  . Diclofenac Sodium CR 100 MG 24 hr tablet Take 1 tablet (100 mg total) by mouth daily. 5 tablet 0  . docusate sodium (COLACE) 100 MG capsule Take 100 mg by mouth daily.    Marland Kitchen FLUoxetine (PROZAC) 10 MG capsule Take 20 mg by mouth daily.     Marland Kitchen ibuprofen (ADVIL,MOTRIN) 200 MG tablet Take 600 mg by mouth every 6 (six) hours as needed for moderate pain.     . Iron-Vitamin C 65-125 MG TABS Take 1 tablet by mouth daily.    . Multiple Vitamin (MULTIVITAMIN WITH MINERALS) TABS tablet Take 1 tablet by mouth daily.    Marland Kitchen omeprazole (PRILOSEC) 20 MG capsule Take 20 mg by mouth daily.    . ondansetron (ZOFRAN) 8 MG tablet Take 1 tablet (8 mg total) by mouth 2 (two) times daily as needed for refractory nausea / vomiting. Start on day 3  after chemo. 30 tablet 1  . prochlorperazine (COMPAZINE) 10 MG tablet Take 1 tablet (10 mg total) by mouth every 6 (six) hours as needed (Nausea or vomiting). 30 tablet 1  . venlafaxine XR (EFFEXOR-XR) 75 MG 24 hr capsule Take 1 capsule (75 mg total) by mouth daily with breakfast. 30 capsule 1  . zolpidem (AMBIEN) 5 MG tablet Take 1 tablet (5 mg total) by mouth at bedtime as needed for sleep. 20 tablet 0   No current facility-administered medications for this visit.     PHYSICAL EXAMINATION: ECOG PERFORMANCE STATUS: 1 - Symptomatic but completely ambulatory  ** No vitals taken today, Exam not performed today **  LABORATORY DATA:  I have  reviewed the data as listed CBC Latest Ref Rng & Units 04/01/2019 03/05/2019 02/18/2019  WBC 4.0 - 10.5 K/uL 15.1(H) 15.1(H) 6.7  Hemoglobin 12.0 - 15.0 g/dL 11.3(L) 11.0(L) 11.4(L)  Hematocrit 36.0 - 46.0 % 36.1 34.7(L) 35.3(L)  Platelets 150 - 400 K/uL 275 384 308     CMP Latest Ref Rng & Units 04/01/2019 03/05/2019 02/18/2019  Glucose 70 - 99 mg/dL 87 113(H) 92  BUN 6 - 20 mg/dL _0 Creatinine 0.44 - 1.00 mg/dL 0.76 0.62 0.67  Sodium 135 - 145 mmol/L 137 139 138  Potassium 3.5 - 5.1 mmol/L 3.5 3.6 4.9  Chloride 98 - 111 mmol/L 103 104 103  CO2 22 - 32 mmol/L _1 Calcium 8.9 - 10.3 mg/dL 10.0 9.6 10.1  Total Protein 6.5 - 8.1 g/dL 7.9 6.8 -  Total Bilirubin 0.3 - 1.2 mg/dL 0.3 0.4 -  Alkaline Phos 38 - 126 U/L 75 75 -  AST 15 - 41 U/L 22 29 -  ALT 0 - 44 U/L 21 26 -      RADIOGRAPHIC STUDIES: I have personally reviewed the radiological images as listed and agreed with the findings in the report. No results found.   ASSESSMENT & PLAN:  [name] is a 53 y.o. female with   1.Malignant neoplasm of upper-outer quadrant of right breast,invasive lobular carcinoma, stageIA,p(T1a,N1a,M0), ER10%+, PR-, HER2-Grade I -She was recently diagnosed in 12/2018. She is s/p b/l mastectomyon3/11/20.  -Idiscussedthe pathology  results with her in great detail which shows her small tumor was removed by biopsy alone and there were no cancer cells found inher mastectomy sample. Her margins were negative, however she has 1/5 positive lymph nodes.  -She started adjuvant chemo with TC every 3 weeks for 4 cycles on 04/01/19. -She tolerated first cycle chemo moderately well with HA, hot flashes, insomnia and acid reflux which is mostly related to her steroids and chemo. We discussed management of these symptoms. Will reduce her dexa from next cycle.  -F/u in 2 weeks with next cycle chemo   2. Genetic Testing  -She does note some family history for malignancy so I will see if her insurance will cover testing as she is interested.   3. Social support  -She takes care of her daughter full time who is on total care and ventilator  -she has a Marine scientist for 5 days of total care for her daughter.  -She has good family support  4. Right knee pain, osteoarthritis   -Musculoskeletal/arthritic pain  -Improved lately  -On Flexeril and Tylenol as needed for arthritis.   5. HA, Insomnia  -Insomnia is probably related to steroids which also caused her HA. I will call in Ambien to use as needed to help her sleep. (04/05/19),and reduce her postchemo dexa to once daily  -I discussed Zofran can cause HA. I encouraged her to use compazine for now instead   6. Hot flashes -She is currently on Prozac, hot flush got worse after chemo  -I recommend her switching Prozac to Effexor  -She will reduce Prozac from 68m to 259mdaily this week and then 1027maily for a week then stop, she will start Effexor in a week. I prescribed today (04/05/19)  7. Acid Reflux  -it got worse after chemo.  -I dicussed Acid reflux is likely related to steroids. She will increase Prilosec to twice daily and use zantac as needed.    PLAN: -I called in Ambien and Effexor today, she  will wean off Prozac -Lab, f/u and treatment in 2 weeks   No  problem-specific Assessment & Plan notes found for this encounter.   No orders of the defined types were placed in this encounter.  I discussed the assessment and treatment plan with the patient. The patient was provided an opportunity to ask questions and all were answered. The patient agreed with the plan and demonstrated an understanding of the instructions.  The patient was advised to call back or seek an in-person evaluation if the symptoms worsen or if the condition fails to improve as anticipated.   I provided 15 minutes of non face-to-face telephone visit time during this encounter, and > 50% was spent counseling as documented under my assessment & plan.    Truitt Merle, MD 04/05/2019   I, Joslyn Devon, am acting as scribe for Truitt Merle, MD.   I have reviewed the above documentation for accuracy and completeness, and I agree with the above.

## 2019-04-05 NOTE — Telephone Encounter (Signed)
Received call from patient stating she has been having headaches since she has gotten her chemo.  Informed her that it could be coming from the Zofran.  She also has been unable to sleep which could be coming from the steroid.  She states the hot flashes are bothering her as well and she takes Prozac for this which doesn't seem to helping.  Informed her I would send Dr. Burr Medico a message with her concerns and will get back with her. Patient verbalized understanding.

## 2019-04-07 ENCOUNTER — Encounter: Payer: Self-pay | Admitting: Hematology

## 2019-04-07 NOTE — Progress Notes (Signed)
Called pt to introduce myself as her Arboriculturist and to discuss copay assistance.  I left a msg requesting she return my call at her earliest convenience.

## 2019-04-08 ENCOUNTER — Encounter: Payer: Self-pay | Admitting: Hematology

## 2019-04-08 ENCOUNTER — Inpatient Hospital Stay: Payer: BLUE CROSS/BLUE SHIELD | Admitting: Hematology

## 2019-04-08 NOTE — Progress Notes (Signed)
Pt returned my call and would like to apply for copay assistance.  She gave me consent to apply in her behalf so I applied to the[employer]and she was approvedfor $16,000. The standard award is $2,500 and the additional $13,500 is accessible on a first-come, first- served basis as long as funding remains available in the [place] with a 6 month look back periodfrom 04/08/19.  Pt isabove the income requirementfor the [employer] she doesn't qualify at this time. Iwillgiveher my cardat her next visit for anyquestions or concernsshe may havein the future.

## 2019-04-09 ENCOUNTER — Telehealth: Payer: Self-pay | Admitting: *Deleted

## 2019-04-09 NOTE — Telephone Encounter (Signed)
Received call from patient asking a question regarding her implant exchange and radiation that she had posed to Dr. Burr Medico.  Informed I would send a message to Dr. Burr Medico to see if she has discussed with Dr. Sondra Come.

## 2019-04-13 ENCOUNTER — Ambulatory Visit: Payer: BLUE CROSS/BLUE SHIELD | Admitting: Plastic Surgery

## 2019-04-15 ENCOUNTER — Telehealth: Payer: Self-pay | Admitting: Plastic Surgery

## 2019-04-15 ENCOUNTER — Telehealth: Payer: Self-pay

## 2019-04-15 ENCOUNTER — Telehealth: Payer: Self-pay | Admitting: *Deleted

## 2019-04-15 NOTE — Telephone Encounter (Signed)
Called patient to confirm appointment scheduled for tomorrow. Patient answered the following questions: °1.Has the patient traveled outside of the state of [place] at all within the past 6 weeks? No °2.Does the patient have a fever or cough at all? No °3.Has the patient been tested for COVID? Had a positive COVID test? No °4. Has the patient been in contact with anyone who has tested positive? No ° °

## 2019-04-15 NOTE — Telephone Encounter (Signed)
Spoke with patient regarding some questions she had regarding surgery and radiation.

## 2019-04-15 NOTE — Telephone Encounter (Signed)
Pt with general questions/concerns about time between chemotherapy and surgery. Conveyed to pt that if she would proceed to radiation prior to surgery, her surgery would be delayed months to allow for healing from radiation. Pt reports that she will proceed with current plan and knows to contact this RN with any further questions/concerns. Loma Sousa, RN BSN

## 2019-04-16 ENCOUNTER — Ambulatory Visit (INDEPENDENT_AMBULATORY_CARE_PROVIDER_SITE_OTHER): Payer: BLUE CROSS/BLUE SHIELD | Admitting: Plastic Surgery

## 2019-04-16 ENCOUNTER — Encounter: Payer: Self-pay | Admitting: Plastic Surgery

## 2019-04-16 ENCOUNTER — Other Ambulatory Visit: Payer: Self-pay

## 2019-04-16 VITALS — BP 106/73 | HR 83 | Temp 97.8°F | Ht 63.0 in | Wt 173.0 lb

## 2019-04-16 DIAGNOSIS — Z9013 Acquired absence of bilateral breasts and nipples: Secondary | ICD-10-CM

## 2019-04-16 NOTE — Progress Notes (Signed)
   Subjective:    Patient ID: [name], female    DOB: [birth year], 53 y.o.   MRN: [id number]  The patient is a 53 yrs old female here for follow up on her bilateral reconstruction.  She is doing well.  No sign of infection, seroma or hematoma.  She has another chemo treatment the end of May and should be finished the middle of June.  She is staying home and keeping safe.  I requested she continue until surgery.    Review of Systems  Constitutional: Negative.   HENT: Negative.   Eyes: Negative.   Respiratory: Negative.   Cardiovascular: Negative.   Gastrointestinal: Negative.   Endocrine: Negative.   Genitourinary: Negative.   Musculoskeletal: Negative.   Psychiatric/Behavioral: Negative.        Objective:   Physical Exam Vitals signs and nursing note reviewed.  Constitutional:      Appearance: Normal appearance.  HENT:     Head: Normocephalic and atraumatic.  Cardiovascular:     Rate and Rhythm: Normal rate.     Pulses: Normal pulses.  Pulmonary:     Effort: Pulmonary effort is normal.  Neurological:     General: No focal deficit present.     Mental Status: She is alert and oriented to person, place, and time.  Psychiatric:        Mood and Affect: Mood normal.        Behavior: Behavior normal.       Assessment & Plan:  Acquired absence of bilateral breasts and nipples We will plan to do the exchange prior to radiation.  We placed injectable saline in the Expander using a sterile technique: Right: 50 cc for a total of 400 / 535 cc Left: 50 cc for a total of 400 / 535 cc

## 2019-04-20 NOTE — Progress Notes (Signed)
[name]   [phone] [phone]   Clinic Follow up Note   Patient Care Team: Merwyn Katos as PCP - General (Physician Assistant) Jovita Kussmaul, MD as Consulting Physician (General Surgery) Truitt Merle, MD as Consulting Physician (Hematology) Gery Pray, MD as Consulting Physician (Radiation Oncology) Rockwell Germany, RN as Oncology Nurse Navigator Mauro Kaufmann, RN as Oncology Nurse Navigator 04/21/2019  CHIEF COMPLAINT: f/u breast cancer   SUMMARY OF ONCOLOGIC HISTORY: Oncology History   Cancer Staging Malignant neoplasm of upper-outer quadrant of right breast in female, estrogen receptor positive ([place]) Staging form: Breast, AJCC 8th Edition - Clinical stage from 01/15/2019: Stage IA (cT1b, cN0, cM0, G1, ER+, PR-, HER2-) - Signed by Truitt Merle, MD on 01/26/2019 - Pathologic stage from 02/24/2019: Stage IB (pT1a, pN1a, cM0, G1, ER+, PR-, HER2-) - Signed by Truitt Merle, MD on 03/18/2019       Malignant neoplasm of upper-outer quadrant of right breast in female, estrogen receptor positive ([place])   01/15/2019 Cancer Staging    Staging form: Breast, AJCC 8th Edition - Clinical stage from 01/15/2019: Stage IA (cT1b, cN0, cM0, G1, ER+, PR-, HER2-) - Signed by Truitt Merle, MD on 01/26/2019    01/15/2019 Mammogram    Diagnostic Mammogram 01/15/19  IMPRESSION Targeted ultrasound is performed, showing right breast 10 o'clock 5 cm from the nipple hypoechoic round mass measuring 0.7 x 0.5 x 0.6 cm. Adjacent to it there is a probable complicated cyst measuring 4 mm. There is no evidence of right axillary lymphadenopathy.    01/15/2019 Initial Biopsy    Diagnosis 01/15/19  Breast, right, needle core biopsy, upper outer - INVASIVE AND IN SITU MAMMARY CARCINOMA WITH CALCIFICATIONS. - SEE COMMENT. 1 of 3 FINAL for Graver, Josetta P ([id number]) Microscopic Comment The carcinoma appears grade I. The malignant cells are positive for cytokeratin  AE1/AE3 and negative for E-cadherin. A breast prognostic profile will be performed and the results reported separately. Dr Vicente Males has reviewed the case and concurs with this interpretation. The results are called to [facility] on 01/18/2019. (JBK:ah:ecj 01/18/19)    01/15/2019 Receptors her2    PROGNOSTIC INDICATORS Results: IMMUNOHISTOCHEMICAL AND MORPHOMETRIC ANALYSIS PERFORMED MANUALLY The tumor cells are NEGATIVE for Her2 (0). Estrogen Receptor: 10%, POSITIVE, MODERATE STAINING INTENSITY Progesterone Receptor: 0%, NEGATIVE Proliferation Marker Ki67: <1%    01/21/2019 Initial Diagnosis    Malignant neoplasm of upper-outer quadrant of right breast in female, estrogen receptor positive ([place])    01/29/2019 Breast MRI    IMPRESSION: 1. The biopsy-proven site of malignancy in the upper-outer quadrant of the right breast resides centrally with in an area of suspicious non mass enhancement spanning 6-7 cm.  2. There is a 6 mm suspicious enhancing mass in the lower-inner quadrant of the right breast, posterior depth.  3. There is a 9 mm indeterminate enhancing mass in the lower outer quadrant of the right breast.  4.  No evidence of malignancy in the left breast.    02/24/2019 Surgery    RIGHT MASTECTOMY WITH SENTINEL LYMPH NODE MAPPING AND LEFT PROPHYLACTIC MASTECTOMY by Dr. Marlou Starks  BREAST RECONSTRUCTION WITH PLACEMENT OF TISSUE EXPANDER by Dr. Marla Roe  02/24/19     02/24/2019 Pathology Results    Diagnosis 1. Breast, simple mastectomy, Left - BENIGN BREAST PARENCHYMA WITH FIBROCYSTIC CHANGE. - NEGATIVE FOR CARCINOMA. 2. Breast, simple mastectomy, Right - LOBULAR CARCINOMA IN SITU, INTERMEDIATE TO HIGH NUCLEAR GRADE. SEE NOTE. - NO EVIDENCE OF  RESIDUAL INVASIVE CARCINOMA. - RESECTION MARGINS ARE NEGATIVE FOR IN SITU OR INVASIVE CARCINOMA. - BIOPSY SITE CHANGES. - SEE ONCOLOGY TABLE. 3. Lymph node, sentinel, biopsy, Right #1 - IMMUNOHISTOCHEMICAL STAIN  FOR AE1/AE3 IS NEGATIVE FOR CARCINOMA (0/1). 4. Lymph node, sentinel, biopsy, Right - IMMUNOHISTOCHEMICAL STAIN FOR AE1/AE3 IS NEGATIVE FOR CARCINOMA (0/1). 5. Lymph node, sentinel, biopsy, Right #2 - IMMUNOHISTOCHEMICAL STAIN FOR AE1/AE3 IS NEGATIVE FOR CARCINOMA (0/1). 6. Lymph node, sentinel, biopsy, Right - IMMUNOHISTOCHEMICAL STAIN FOR AE1/AE3 IS NEGATIVE FOR CARCINOMA (0/1). 7. Lymph node, sentinel, biopsy, Right - METASTATIC CARCINOMA TO ONE LYMPH NODE (1/1) (CONFIRMED WITH IMMUNOHISTOCHEMICAL STAIN FOR AE1/AE3) - FOCUS OF METASTATIC CARCINOMA MEASURES JUST OVER 0.2 CM IN GREATEST DIMENSION - NO EVIDENCE OF EXTRANODAL EXTENSION    02/24/2019 Cancer Staging    Staging form: Breast, AJCC 8th Edition - Pathologic stage from 02/24/2019: Stage IB (pT1a, pN1a, cM0, G1, ER+, PR-, HER2-) - Signed by Truitt Merle, MD on 03/18/2019    04/01/2019 -  Chemotherapy    adjuvant Taxol and Carboplatin every 3 weeks for 4 cycles starting in 2 weeks starting 04/01/19     CURRENT THERAPY: CURRENT THERAPY:  adjuvantDocetaxel and Cytoxanevery 3 weeks for 4 cycles startingtoday 04/01/2019  INTERVAL HISTORY: [name] returns for follow up as scheduled for cycle 2 chemotherapy. She completed cycle 1 on 04/01/19. She was seen on 4/20 by Dr. Burr Medico for toxicity check. Today she feels well. Appetite is good. She is tired from poor sleep, Lorrin Mais helps. GERD is worse on steroids, takes Tums. Uses milk of mag for occasional constipation. Denies n/v/d. Had a few mouth sores that resolved with magic mouthwash; did not hinder po intake. Headache improved off zofran. Hot flashes are much better after switching from prozac to effexor, less frequent now. Her mood is "fine." Denies fever, cough, chest pain, dyspnea, edema, neuropathy, or concerns with her breast incisions. Denies sick contacts or covid exposure.   REVIEW OF SYSTEMS:   Constitutional: Denies fevers, chills or abnormal weight loss (+) tiredness  Eyes: Denies  blurriness of vision Ears, nose, mouth, throat, and face: Denies sore throat (+) mild mucositis after chemo, resolved  Respiratory: Denies cough, dyspnea or wheezes Cardiovascular: Denies palpitation, chest discomfort or lower extremity swelling Gastrointestinal:  Denies nausea, vomiting, diarrhea, GI bleeding (+) reflux worse on steroids (+) intermittent constipation  Skin: Denies abnormal skin rashes Lymphatics: Denies new lymphadenopathy or easy bruising Neurological:Denies numbness, tingling or new weaknesses (+) headache, improved (+) hot flash, improved (+) insomnia, improved  Behavioral/Psych: Mood is stable, no new changes  All other systems were reviewed with the patient and are negative.  MEDICAL HISTORY:  Past Medical History:  Diagnosis Date  . Anemia   . Arthritis   . Cancer [facility])    right breast  . Family history of adverse reaction to anesthesia    sister has same issues  . GERD (gastroesophageal reflux disease)   . PONV (postoperative nausea and vomiting)     SURGICAL HISTORY: Past Surgical History:  Procedure Laterality Date  . BREAST RECONSTRUCTION WITH PLACEMENT OF TISSUE EXPANDER AND FLEX HD (ACELLULAR HYDRATED DERMIS) Bilateral 02/24/2019   Procedure: BREAST RECONSTRUCTION WITH PLACEMENT OF TISSUE EXPANDER AND FLEX HD (ACELLULAR HYDRATED DERMIS);  Surgeon: Wallace Going, DO;  Location: [place];  Service: Plastics;  Laterality: Bilateral;  . BREAST SURGERY     left breast bx  . EYE SURGERY     dart to the eye  (right)  . FACIAL FRACTURE SURGERY    .  FRACTURE SURGERY    . KNEE SURGERY    . MASTECTOMY W/ SENTINEL NODE BIOPSY Bilateral 02/24/2019   Procedure: RIGHT MASTECTOMY WITH SENTINEL LYMPH NODE MAPPING AND LEFT PROPHYLACTIC MASTECTOMY;  Surgeon: Jovita Kussmaul, MD;  Location: [place];  Service: General;  Laterality: Bilateral;  . SHOULDER SURGERY      I have reviewed the social history and family history with the patient and they are unchanged from  previous note.  ALLERGIES:  is allergic to codeine and tramadol.  MEDICATIONS:  Current Outpatient Medications  Medication Sig Dispense Refill  . Cholecalciferol (VITAMIN D3) 125 MCG (5000 UT) CAPS Take 5,000 Units by mouth daily.    . Cyanocobalamin (B-12) 2500 MCG TABS Take 2,500 mcg by mouth daily.    . cyclobenzaprine (FLEXERIL) 10 MG tablet Take 1 tablet (10 mg total) by mouth 2 (two) times daily as needed for muscle spasms. 30 tablet 0  . dexamethasone (DECADRON) 4 MG tablet Take 2 tablets (8 mg total) by mouth 2 (two) times daily. Start the day before Taxotere. Then 1 tab twice daily the day after chemo for 3 days. 30 tablet 1  . Diclofenac Sodium CR 100 MG 24 hr tablet Take 1 tablet (100 mg total) by mouth daily. 5 tablet 0  . docusate sodium (COLACE) 100 MG capsule Take 100 mg by mouth daily.    Marland Kitchen ibuprofen (ADVIL,MOTRIN) 200 MG tablet Take 600 mg by mouth every 6 (six) hours as needed for moderate pain.     . Iron-Vitamin C 65-125 MG TABS Take 1 tablet by mouth daily.    . Multiple Vitamin (MULTIVITAMIN WITH MINERALS) TABS tablet Take 1 tablet by mouth daily.    Marland Kitchen omeprazole (PRILOSEC) 20 MG capsule Take 20 mg by mouth daily.    . prochlorperazine (COMPAZINE) 10 MG tablet Take 1 tablet (10 mg total) by mouth every 6 (six) hours as needed (Nausea or vomiting). 30 tablet 1  . venlafaxine XR (EFFEXOR-XR) 75 MG 24 hr capsule Take 1 capsule (75 mg total) by mouth daily with breakfast. 30 capsule 1  . zolpidem (AMBIEN) 5 MG tablet Take 1 tablet (5 mg total) by mouth at bedtime as needed for sleep. 20 tablet 0  . acetaminophen (TYLENOL) 500 MG tablet Take 1,000 mg by mouth every 6 (six) hours as needed for moderate pain.    Marland Kitchen LORazepam (ATIVAN) 0.5 MG tablet Take 1 tablet (0.5 mg total) by mouth every 8 (eight) hours as needed for anxiety. 20 tablet 0  . ondansetron (ZOFRAN) 8 MG tablet Take 1 tablet (8 mg total) by mouth 2 (two) times daily as needed for refractory nausea / vomiting. Start  on day 3 after chemo. 30 tablet 1   No current facility-administered medications for this visit.    Facility-Administered Medications Ordered in Other Visits  Medication Dose Route Frequency Provider Last Rate Last Dose  . cyclophosphamide (CYTOXAN) 1,120 mg in sodium chloride 0.9 % 250 mL chemo infusion  600 mg/m2 (Treatment Plan Recorded) Intravenous Once Truitt Merle, MD      . pegfilgrastim (NEULASTA ONPRO KIT) injection 6 mg  6 mg Subcutaneous Once Truitt Merle, MD        PHYSICAL EXAMINATION: ECOG PERFORMANCE STATUS: 1 - Symptomatic but completely ambulatory  Vitals:   04/21/19 0814  BP: 117/76  Pulse: 88  Resp: 18  Temp: 99.1 F (37.3 C)  SpO2: 100%   Filed Weights   04/21/19 0814  Weight: 174 lb 12.8 oz (79.3 kg)  GENERAL:alert, no distress and comfortable SKIN: no rash EYES:  sclera clear OROPHARYNX:no thrush or ulcers LUNGS:  with normal breathing effort HEART:  no lower extremity edema Musculoskeletal:no cyanosis of digits and no clubbing  NEURO: alert & oriented x 3 with fluent speech Breast: incisions closed intact, healed, without erythema or drainage   LABORATORY DATA:  I have reviewed the data as listed CBC Latest Ref Rng & Units 04/21/2019 04/01/2019 03/05/2019  WBC 4.0 - 10.5 K/uL 20.7(H) 15.1(H) 15.1(H)  Hemoglobin 12.0 - 15.0 g/dL 11.1(L) 11.3(L) 11.0(L)  Hematocrit 36.0 - 46.0 % 35.1(L) 36.1 34.7(L)  Platelets 150 - 400 K/uL 450(H) 275 384     CMP Latest Ref Rng & Units 04/21/2019 04/01/2019 03/05/2019  Glucose 70 - 99 mg/dL 76 87 113(H)  BUN 6 - 20 mg/dL '14 13 9  ' Creatinine 0.44 - 1.00 mg/dL 0.72 0.76 0.62  Sodium 135 - 145 mmol/L 140 137 139  Potassium 3.5 - 5.1 mmol/L 3.4(L) 3.5 3.6  Chloride 98 - 111 mmol/L 105 103 104  CO2 22 - 32 mmol/L '24 23 26  ' Calcium 8.9 - 10.3 mg/dL 9.7 10.0 9.6  Total Protein 6.5 - 8.1 g/dL 7.4 7.9 6.8  Total Bilirubin 0.3 - 1.2 mg/dL 0.3 0.3 0.4  Alkaline Phos 38 - 126 U/L 86 75 75  AST 15 - 41 U/L '16 22 29  ' ALT 0 - 44  U/L '21 21 26      ' RADIOGRAPHIC STUDIES: I have personally reviewed the radiological images as listed and agreed with the findings in the report. No results found.   ASSESSMENT & PLAN: [name] is a 53 y.o. female with   1.Malignant neoplasm of upper-outer quadrant of right breast,invasive lobular carcinoma, stageIA,p(T1a,N1a,M0), ER10%+, PR-, HER2-Grade I - diagnosed in 12/2018, s/p b/l mastectomyon3/11/20.  -She started adjuvant chemo with TC every 3 weeks for 4 cycles on 04/01/19. -She tolerated first cycle chemo moderately well with HA, hot flashes, insomnia and acid reflux which is mostly related to her steroids and chemo.  -today she appears well, labs reviewed, adequate to proceed with cycle 2 TC with onpro  -she will reduce post-chemo dex to 1 tab daily for 2-3 days. She will use compazine for nausea, as zofran may have contributed to her headache after cycle 1.  -Insomnia improved on ambien, I will refill -labs reviewed, WBC 20, likely due to neulasta and steroids. Afebrile. No clinical concern for infection. K is 3.4, I recommend to increase K-rich foods in diet. -we reviewed s/sx of infection -she will return in 3 weeks for lab, f/u, and cycle 3   2. Genetic Testing  - appt pending. Patient questions insurance coverage   3. Social support  -she has a daughter with special needs and strong nursing support at home -she has intermittent anxiety, previously prescribed diazepam -I recommend discontinuing diazepam and changing to lorazepam PRN, which can also help with chemo-related nausea. I prescribed for her today.   4. Right knee pain, osteoarthritis  - on flexeril and tylenol PRN   5. HA, Insomnia -likely related to steroids and chemo -much improved overall -insomnia better with ambien, I refilled  -she does not take ambien on nights she does not have nursing support for her daughter   67. Hot flashes -Dr. Burr Medico recommend her switching Prozac to Effexor   -hot flashes are much improved on effexor, less frequent and better overall  7. Acid Reflux, worsened on chemo -takes Tums -she will try dex 1 tab  daily after chemo instead of twice daily for 3 days    PLAN: -labs reviewed, proceed with cycle 2 TC today, with onpro  -Refill ambien -new Rx: ativan 1 tab q8 PRN -decrease post-chemo dexamethasone to 1 tab daily for 2-3 days  -Return in 3 weeks for lab, f/u, and cycle 3  No problem-specific Assessment & Plan notes found for this encounter.   No orders of the defined types were placed in this encounter.  All questions were answered. The patient knows to call the clinic with any problems, questions or concerns. No barriers to learning was detected.     Alla Feeling, NP 04/21/19

## 2019-04-21 ENCOUNTER — Inpatient Hospital Stay: Payer: BLUE CROSS/BLUE SHIELD | Attending: Hematology

## 2019-04-21 ENCOUNTER — Other Ambulatory Visit: Payer: Self-pay

## 2019-04-21 ENCOUNTER — Encounter: Payer: Self-pay | Admitting: *Deleted

## 2019-04-21 ENCOUNTER — Inpatient Hospital Stay: Payer: BLUE CROSS/BLUE SHIELD | Admitting: Medical

## 2019-04-21 ENCOUNTER — Inpatient Hospital Stay: Payer: BLUE CROSS/BLUE SHIELD

## 2019-04-21 ENCOUNTER — Telehealth: Payer: Self-pay | Admitting: Nurse Practitioner

## 2019-04-21 ENCOUNTER — Inpatient Hospital Stay (HOSPITAL_BASED_OUTPATIENT_CLINIC_OR_DEPARTMENT_OTHER): Payer: BLUE CROSS/BLUE SHIELD | Admitting: Nurse Practitioner

## 2019-04-21 ENCOUNTER — Encounter: Payer: Self-pay | Admitting: Nurse Practitioner

## 2019-04-21 VITALS — BP 117/76 | HR 88 | Temp 99.1°F | Resp 18 | Ht 63.0 in | Wt 174.8 lb

## 2019-04-21 DIAGNOSIS — Z170 Estrogen receptor positive status [ER+]: Principal | ICD-10-CM

## 2019-04-21 DIAGNOSIS — Z5111 Encounter for antineoplastic chemotherapy: Secondary | ICD-10-CM | POA: Diagnosis not present

## 2019-04-21 DIAGNOSIS — G4700 Insomnia, unspecified: Secondary | ICD-10-CM | POA: Insufficient documentation

## 2019-04-21 DIAGNOSIS — Z79899 Other long term (current) drug therapy: Secondary | ICD-10-CM

## 2019-04-21 DIAGNOSIS — Z9013 Acquired absence of bilateral breasts and nipples: Secondary | ICD-10-CM | POA: Insufficient documentation

## 2019-04-21 DIAGNOSIS — Z7689 Persons encountering health services in other specified circumstances: Secondary | ICD-10-CM | POA: Insufficient documentation

## 2019-04-21 DIAGNOSIS — K5900 Constipation, unspecified: Secondary | ICD-10-CM | POA: Diagnosis not present

## 2019-04-21 DIAGNOSIS — C50411 Malignant neoplasm of upper-outer quadrant of right female breast: Secondary | ICD-10-CM

## 2019-04-21 DIAGNOSIS — K219 Gastro-esophageal reflux disease without esophagitis: Secondary | ICD-10-CM

## 2019-04-21 DIAGNOSIS — Z9011 Acquired absence of right breast and nipple: Secondary | ICD-10-CM

## 2019-04-21 DIAGNOSIS — R232 Flushing: Secondary | ICD-10-CM

## 2019-04-21 DIAGNOSIS — M25561 Pain in right knee: Secondary | ICD-10-CM

## 2019-04-21 LAB — CBC WITH DIFFERENTIAL (CANCER CENTER ONLY)
Abs Immature Granulocytes: 0.2 10*3/uL — ABNORMAL HIGH (ref 0.00–0.07)
Basophils Absolute: 0 10*3/uL (ref 0.0–0.1)
Basophils Relative: 0 %
Eosinophils Absolute: 0 10*3/uL (ref 0.0–0.5)
Eosinophils Relative: 0 %
HCT: 35.1 % — ABNORMAL LOW (ref 36.0–46.0)
Hemoglobin: 11.1 g/dL — ABNORMAL LOW (ref 12.0–15.0)
Immature Granulocytes: 1 %
Lymphocytes Relative: 8 %
Lymphs Abs: 1.7 10*3/uL (ref 0.7–4.0)
MCH: 28.9 pg (ref 26.0–34.0)
MCHC: 31.6 g/dL (ref 30.0–36.0)
MCV: 91.4 fL (ref 80.0–100.0)
Monocytes Absolute: 1.7 10*3/uL — ABNORMAL HIGH (ref 0.1–1.0)
Monocytes Relative: 8 %
Neutro Abs: 17.1 10*3/uL — ABNORMAL HIGH (ref 1.7–7.7)
Neutrophils Relative %: 83 %
Platelet Count: 450 10*3/uL — ABNORMAL HIGH (ref 150–400)
RBC: 3.84 MIL/uL — ABNORMAL LOW (ref 3.87–5.11)
RDW: 13.9 % (ref 11.5–15.5)
WBC Count: 20.7 10*3/uL — ABNORMAL HIGH (ref 4.0–10.5)
nRBC: 0 % (ref 0.0–0.2)

## 2019-04-21 LAB — CMP (CANCER CENTER ONLY)
ALT: 21 U/L (ref 0–44)
AST: 16 U/L (ref 15–41)
Albumin: 3.9 g/dL (ref 3.5–5.0)
Alkaline Phosphatase: 86 U/L (ref 38–126)
Anion gap: 11 (ref 5–15)
BUN: 14 mg/dL (ref 6–20)
CO2: 24 mmol/L (ref 22–32)
Calcium: 9.7 mg/dL (ref 8.9–10.3)
Chloride: 105 mmol/L (ref 98–111)
Creatinine: 0.72 mg/dL (ref 0.44–1.00)
GFR, Est AFR Am: >60 mL/min (ref >60)
GFR, Estimated: >60 mL/min (ref >60)
Glucose, Bld: 76 mg/dL (ref 70–99)
Potassium: 3.4 mmol/L — ABNORMAL LOW (ref 3.5–5.1)
Sodium: 140 mmol/L (ref 135–145)
Total Bilirubin: 0.3 mg/dL (ref 0.3–1.2)
Total Protein: 7.4 g/dL (ref 6.5–8.1)

## 2019-04-21 MED ORDER — DEXAMETHASONE SODIUM PHOSPHATE 10 MG/ML IJ SOLN
INTRAMUSCULAR | Status: AC
  Filled 2019-04-21: qty 1

## 2019-04-21 MED ORDER — SODIUM CHLORIDE 0.9 % IV SOLN
600.0000 mg/m2 | Freq: Once | INTRAVENOUS | Status: AC
  Administered 2019-04-21: 1120 mg via INTRAVENOUS
  Filled 2019-04-21: qty 56

## 2019-04-21 MED ORDER — PEGFILGRASTIM 6 MG/0.6ML ~~LOC~~ PSKT
6.0000 mg | PREFILLED_SYRINGE | Freq: Once | SUBCUTANEOUS | Status: AC
  Administered 2019-04-21: 14:00:00 6 mg via SUBCUTANEOUS

## 2019-04-21 MED ORDER — SODIUM CHLORIDE 0.9 % IV SOLN
Freq: Once | INTRAVENOUS | Status: AC
  Administered 2019-04-21: 09:00:00 via INTRAVENOUS
  Filled 2019-04-21: qty 250

## 2019-04-21 MED ORDER — LORAZEPAM 0.5 MG PO TABS: 0.5000 mg | ORAL_TABLET | Freq: Three times a day (TID) | ORAL | 0 refills | Status: DC | PRN

## 2019-04-21 MED ORDER — PALONOSETRON HCL INJECTION 0.25 MG/5ML
0.2500 mg | Freq: Once | INTRAVENOUS | Status: AC
  Administered 2019-04-21: 0.25 mg via INTRAVENOUS

## 2019-04-21 MED ORDER — SODIUM CHLORIDE 0.9 % IV SOLN
75.0000 mg/m2 | Freq: Once | INTRAVENOUS | Status: AC
  Administered 2019-04-21: 140 mg via INTRAVENOUS
  Filled 2019-04-21: qty 14

## 2019-04-21 MED ORDER — PEGFILGRASTIM 6 MG/0.6ML ~~LOC~~ PSKT
PREFILLED_SYRINGE | SUBCUTANEOUS | Status: AC
  Filled 2019-04-21: qty 0.6

## 2019-04-21 MED ORDER — DEXAMETHASONE SODIUM PHOSPHATE 10 MG/ML IJ SOLN
10.0000 mg | Freq: Once | INTRAMUSCULAR | Status: AC
  Administered 2019-04-21: 10 mg via INTRAVENOUS

## 2019-04-21 MED ORDER — PALONOSETRON HCL INJECTION 0.25 MG/5ML
INTRAVENOUS | Status: AC
  Filled 2019-04-21: qty 5

## 2019-04-21 NOTE — Patient Instructions (Signed)
[facility] Discharge Instructions for Patients Receiving Chemotherapy  Today you received the following chemotherapy agents Taxotere and Cytoxan  To help prevent nausea and vomiting after your treatment, we encourage you to take your nausea medication as directed.   If you develop nausea and vomiting that is not controlled by your nausea medication, call the clinic.   BELOW ARE SYMPTOMS THAT SHOULD BE REPORTED IMMEDIATELY:  *FEVER GREATER THAN 100.5 F  *CHILLS WITH OR WITHOUT FEVER  NAUSEA AND VOMITING THAT IS NOT CONTROLLED WITH YOUR NAUSEA MEDICATION  *UNUSUAL SHORTNESS OF BREATH  *UNUSUAL BRUISING OR BLEEDING  TENDERNESS IN MOUTH AND THROAT WITH OR WITHOUT PRESENCE OF ULCERS  *URINARY PROBLEMS  *BOWEL PROBLEMS  UNUSUAL RASH Items with * indicate a potential emergency and should be followed up as soon as possible.  Feel free to call the clinic should you have any questions or concerns. The clinic phone number is [phone].  Please show the [facility] at check-in to the Emergency Department and triage nurse.   Docetaxel injection (Taxotere) What is this medicine? DOCETAXEL (doe se TAX el) is a chemotherapy drug. It targets fast dividing cells, like cancer cells, and causes these cells to die. This medicine is used to treat many types of cancers like breast cancer, certain stomach cancers, head and neck cancer, lung cancer, and prostate cancer. This medicine may be used for other purposes; ask your health care provider or pharmacist if you have questions. COMMON BRAND NAME(S): Docefrez, Taxotere What should I tell my health care provider before I take this medicine? They need to know if you have any of these conditions: -infection (especially a virus infection such as chickenpox, cold sores, or herpes) -liver disease -low blood counts, like low white cell, platelet, or red cell counts -an unusual or allergic reaction to docetaxel, polysorbate 80,  other chemotherapy agents, other medicines, foods, dyes, or preservatives -pregnant or trying to get pregnant -breast-feeding How should I use this medicine? This drug is given as an infusion into a vein. It is administered in a hospital or clinic by a specially trained health care professional. Talk to your pediatrician regarding the use of this medicine in children. Special care may be needed. Overdosage: If you think you have taken too much of this medicine contact a poison control center or emergency room at once. NOTE: This medicine is only for you. Do not share this medicine with others. What if I miss a dose? It is important not to miss your dose. Call your doctor or health care professional if you are unable to keep an appointment. What may interact with this medicine? -cyclosporine -erythromycin -ketoconazole -medicines to increase blood counts like filgrastim, pegfilgrastim, sargramostim -vaccines Talk to your doctor or health care professional before taking any of these medicines: -acetaminophen -aspirin -ibuprofen -ketoprofen -naproxen This list may not describe all possible interactions. Give your health care provider a list of all the medicines, herbs, non-prescription drugs, or dietary supplements you use. Also tell them if you smoke, drink alcohol, or use illegal drugs. Some items may interact with your medicine. What should I watch for while using this medicine? Your condition will be monitored carefully while you are receiving this medicine. You will need important blood work done while you are taking this medicine. This drug may make you feel generally unwell. This is not uncommon, as chemotherapy can affect healthy cells as well as cancer cells. Report any side effects. Continue your course of treatment even though  you feel ill unless your doctor tells you to stop. In some cases, you may be given additional medicines to help with side effects. Follow all directions for  their use. Call your doctor or health care professional for advice if you get a fever, chills or sore throat, or other symptoms of a cold or flu. Do not treat yourself. This drug decreases your body's ability to fight infections. Try to avoid being around people who are sick. This medicine may increase your risk to bruise or bleed. Call your doctor or health care professional if you notice any unusual bleeding. This medicine may contain alcohol in the product. You may get drowsy or dizzy. Do not drive, use machinery, or do anything that needs mental alertness until you know how this medicine affects you. Do not stand or sit up quickly, especially if you are an older patient. This reduces the risk of dizzy or fainting spells. Avoid alcoholic drinks. Do not become pregnant while taking this medicine or for 6 months after stopping it. Women should inform their doctor if they wish to become pregnant or think they might be pregnant. Men should not father a child while taking this medicine and for 3 months after stopping it. There is a potential for serious side effects to an unborn child. Talk to your health care professional or pharmacist for more information. Do not breast-feed an infant while taking this medicine or for 2 weeks after stopping it. This may interfere with the ability to father a child. You should talk to your doctor or health care professional if you are concerned about your fertility. What side effects may I notice from receiving this medicine? Side effects that you should report to your doctor or health care professional as soon as possible: -allergic reactions like skin rash, itching or hives, swelling of the face, lips, or tongue -low blood counts - This drug may decrease the number of white blood cells, red blood cells and platelets. You may be at increased risk for infections and bleeding. -signs of infection - fever or chills, cough, sore throat, pain or difficulty passing urine -signs  of decreased platelets or bleeding - bruising, pinpoint red spots on the skin, black, tarry stools, nosebleeds -signs of decreased red blood cells - unusually weak or tired, fainting spells, lightheadedness -breathing problems -fast or irregular heartbeat -low blood pressure -mouth sores -nausea and vomiting -pain, swelling, redness or irritation at the injection site -pain, tingling, numbness in the hands or feet -swelling of the ankle, feet, hands -weight gain Side effects that usually do not require medical attention (report to your doctor or health care professional if they continue or are bothersome): -bone pain -complete hair loss including hair on your head, underarms, pubic hair, eyebrows, and eyelashes -diarrhea -excessive tearing -changes in the color of fingernails -loosening of the fingernails -nausea -muscle pain -red flush to skin -sweating -weak or tired This list may not describe all possible side effects. Call your doctor for medical advice about side effects. You may report side effects to FDA at 1-800-FDA-1088. Where should I keep my medicine? This drug is given in a hospital or clinic and will not be stored at home. NOTE: This sheet is a summary. It may not cover all possible information. If you have questions about this medicine, talk to your doctor, pharmacist, or health care provider.  2019 Elsevier/Gold Standard (2017-12-29 12:07:21)  Cyclophosphamide (Cytoxan) injection What is this medicine? CYCLOPHOSPHAMIDE (sye kloe FOSS fa mide) is a chemotherapy drug.  It slows the growth of cancer cells. This medicine is used to treat many types of cancer like lymphoma, myeloma, leukemia, breast cancer, and ovarian cancer, to name a few. This medicine may be used for other purposes; ask your health care provider or pharmacist if you have questions. COMMON BRAND NAME(S): Cytoxan, Neosar What should I tell my health care provider before I take this medicine? They need  to know if you have any of these conditions: -blood disorders -history of other chemotherapy -infection -kidney disease -liver disease -recent or ongoing radiation therapy -tumors in the bone marrow -an unusual or allergic reaction to cyclophosphamide, other chemotherapy, other medicines, foods, dyes, or preservatives -pregnant or trying to get pregnant -breast-feeding How should I use this medicine? This drug is usually given as an injection into a vein or muscle or by infusion into a vein. It is administered in a hospital or clinic by a specially trained health care professional. Talk to your pediatrician regarding the use of this medicine in children. Special care may be needed. Overdosage: If you think you have taken too much of this medicine contact a poison control center or emergency room at once. NOTE: This medicine is only for you. Do not share this medicine with others. What if I miss a dose? It is important not to miss your dose. Call your doctor or health care professional if you are unable to keep an appointment. What may interact with this medicine? This medicine may interact with the following medications: -amiodarone -amphotericin B -azathioprine -certain antiviral medicines for HIV or AIDS such as protease inhibitors (e.g., indinavir, ritonavir) and zidovudine -certain blood pressure medications such as benazepril, captopril, enalapril, fosinopril, lisinopril, moexipril, monopril, perindopril, quinapril, ramipril, trandolapril -certain cancer medications such as anthracyclines (e.g., daunorubicin, doxorubicin), busulfan, cytarabine, paclitaxel, pentostatin, tamoxifen, trastuzumab -certain diuretics such as chlorothiazide, chlorthalidone, hydrochlorothiazide, indapamide, metolazone -certain medicines that treat or prevent blood clots like warfarin -certain muscle relaxants such as succinylcholine -cyclosporine -etanercept -indomethacin -medicines to increase blood  counts like filgrastim, pegfilgrastim, sargramostim -medicines used as general anesthesia -metronidazole -natalizumab This list may not describe all possible interactions. Give your health care provider a list of all the medicines, herbs, non-prescription drugs, or dietary supplements you use. Also tell them if you smoke, drink alcohol, or use illegal drugs. Some items may interact with your medicine. What should I watch for while using this medicine? Visit your doctor for checks on your progress. This drug may make you feel generally unwell. This is not uncommon, as chemotherapy can affect healthy cells as well as cancer cells. Report any side effects. Continue your course of treatment even though you feel ill unless your doctor tells you to stop. Drink water or other fluids as directed. Urinate often, even at night. In some cases, you may be given additional medicines to help with side effects. Follow all directions for their use. Call your doctor or health care professional for advice if you get a fever, chills or sore throat, or other symptoms of a cold or flu. Do not treat yourself. This drug decreases your body's ability to fight infections. Try to avoid being around people who are sick. This medicine may increase your risk to bruise or bleed. Call your doctor or health care professional if you notice any unusual bleeding. Be careful brushing and flossing your teeth or using a toothpick because you may get an infection or bleed more easily. If you have any dental work done, tell your dentist you are receiving this  medicine. You may get drowsy or dizzy. Do not drive, use machinery, or do anything that needs mental alertness until you know how this medicine affects you. Do not become pregnant while taking this medicine or for 1 year after stopping it. Women should inform their doctor if they wish to become pregnant or think they might be pregnant. Men should not father a child while taking this  medicine and for 4 months after stopping it. There is a potential for serious side effects to an unborn child. Talk to your health care professional or pharmacist for more information. Do not breast-feed an infant while taking this medicine. This medicine may interfere with the ability to have a child. This medicine has caused ovarian failure in some women. This medicine has caused reduced sperm counts in some men. You should talk with your doctor or health care professional if you are concerned about your fertility. If you are going to have surgery, tell your doctor or health care professional that you have taken this medicine. What side effects may I notice from receiving this medicine? Side effects that you should report to your doctor or health care professional as soon as possible: -allergic reactions like skin rash, itching or hives, swelling of the face, lips, or tongue -low blood counts - this medicine may decrease the number of white blood cells, red blood cells and platelets. You may be at increased risk for infections and bleeding. -signs of infection - fever or chills, cough, sore throat, pain or difficulty passing urine -signs of decreased platelets or bleeding - bruising, pinpoint red spots on the skin, black, tarry stools, blood in the urine -signs of decreased red blood cells - unusually weak or tired, fainting spells, lightheadedness -breathing problems -dark urine -dizziness -palpitations -swelling of the ankles, feet, hands -trouble passing urine or change in the amount of urine -weight gain -yellowing of the eyes or skin Side effects that usually do not require medical attention (report to your doctor or health care professional if they continue or are bothersome): -changes in nail or skin color -hair loss -missed menstrual periods -mouth sores -nausea, vomiting This list may not describe all possible side effects. Call your doctor for medical advice about side effects.  You may report side effects to FDA at 1-800-FDA-1088. Where should I keep my medicine? This drug is given in a hospital or clinic and will not be stored at home. NOTE: This sheet is a summary. It may not cover all possible information. If you have questions about this medicine, talk to your doctor, pharmacist, or health care provider.  2019 Elsevier/Gold Standard (2012-10-16 16:22:58)

## 2019-04-21 NOTE — Telephone Encounter (Signed)
No los per 5/6.

## 2019-04-23 NOTE — Progress Notes (Signed)
Erroneous encounter

## 2019-04-26 ENCOUNTER — Telehealth: Payer: Self-pay | Admitting: *Deleted

## 2019-04-26 ENCOUNTER — Other Ambulatory Visit: Payer: Self-pay | Admitting: *Deleted

## 2019-04-26 MED ORDER — ZOLPIDEM TARTRATE 5 MG PO TABS: 5.0000 mg | ORAL_TABLET | Freq: Every evening | ORAL | 0 refills | Status: DC | PRN

## 2019-04-26 NOTE — Telephone Encounter (Signed)
Received call from patient stating she needed a refill on her Ambien.  She doesn't get much sleep without it.  RN called rx into her pharmacy.

## 2019-04-27 ENCOUNTER — Other Ambulatory Visit: Payer: Self-pay | Admitting: Hematology

## 2019-05-11 NOTE — Progress Notes (Addendum)
[name]   [phone]) [phone] [phone]   Clinic Follow up Note   Patient Care Team: [name] as PCP - General (Physician Assistant) Tiffany Kussmaul, MD as Consulting Physician (General Surgery) Tiffany Merle, MD as Consulting Physician (Hematology) Tiffany Pray, MD as Consulting Physician (Radiation Oncology) Tiffany Germany, RN as Oncology Nurse Navigator Tiffany Kaufmann, RN as Oncology Nurse Navigator 05/13/2019  CHIEF COMPLAINT: f/u breast cancer   SUMMARY OF ONCOLOGIC HISTORY: Oncology History   Cancer Staging Malignant neoplasm of upper-outer quadrant of right breast in female, estrogen receptor positive ([place]) Staging form: Breast, AJCC 8th Edition - Clinical stage from 01/15/2019: Stage IA (cT1b, cN0, cM0, G1, ER+, PR-, HER2-) - Signed by Tiffany Merle, MD on 01/26/2019 - Pathologic stage from 02/24/2019: Stage IB (pT1a, pN1a, cM0, G1, ER+, PR-, HER2-) - Signed by Tiffany Merle, MD on 03/18/2019       Malignant neoplasm of upper-outer quadrant of right breast in female, estrogen receptor positive ([place])   01/15/2019 Cancer Staging    Staging form: Breast, AJCC 8th Edition - Clinical stage from 01/15/2019: Stage IA (cT1b, cN0, cM0, G1, ER+, PR-, HER2-) - Signed by Tiffany Merle, MD on 01/26/2019    01/15/2019 Mammogram    Diagnostic Mammogram 01/15/19  IMPRESSION Targeted ultrasound is performed, showing right breast 10 o'clock 5 cm from the nipple hypoechoic round mass measuring 0.7 x 0.5 x 0.6 cm. Adjacent to it there is a probable complicated cyst measuring 4 mm. There is no evidence of right axillary lymphadenopathy.    01/15/2019 Initial Biopsy    Diagnosis 01/15/19  Breast, right, needle core biopsy, upper outer - INVASIVE AND IN SITU MAMMARY CARCINOMA WITH CALCIFICATIONS. - SEE COMMENT. 1 of 3 FINAL for [name] ([id number]) Microscopic Comment The carcinoma appears grade I. The malignant cells are positive for cytokeratin  AE1/AE3 and negative for E-cadherin. A breast prognostic profile will be performed and the results reported separately. Dr [name] has reviewed the case and concurs with this interpretation. The results are called to [facility] on 01/18/2019. (JBK:ah:ecj 01/18/19)    01/15/2019 Receptors her2    PROGNOSTIC INDICATORS Results: IMMUNOHISTOCHEMICAL AND MORPHOMETRIC ANALYSIS PERFORMED MANUALLY The tumor cells are NEGATIVE for Her2 (0). Estrogen Receptor: 10%, POSITIVE, MODERATE STAINING INTENSITY Progesterone Receptor: 0%, NEGATIVE Proliferation Marker Ki67: <1%    01/21/2019 Initial Diagnosis    Malignant neoplasm of upper-outer quadrant of right breast in female, estrogen receptor positive ([place])    01/29/2019 Breast MRI    IMPRESSION: 1. The biopsy-proven site of malignancy in the upper-outer quadrant of the right breast resides centrally with in an area of suspicious non mass enhancement spanning 6-7 cm.  2. There is a 6 mm suspicious enhancing mass in the lower-inner quadrant of the right breast, posterior depth.  3. There is a 9 mm indeterminate enhancing mass in the lower outer quadrant of the right breast.  4.  No evidence of malignancy in the left breast.    02/24/2019 Surgery    RIGHT MASTECTOMY WITH SENTINEL LYMPH NODE MAPPING AND LEFT PROPHYLACTIC MASTECTOMY by Dr. Marlou Snyder  BREAST RECONSTRUCTION WITH PLACEMENT OF TISSUE EXPANDER by Dr. Marla Snyder  02/24/19     02/24/2019 Pathology Results    Diagnosis 1. Breast, simple mastectomy, Left - BENIGN BREAST PARENCHYMA WITH FIBROCYSTIC CHANGE. - NEGATIVE FOR CARCINOMA. 2. Breast, simple mastectomy, Right - LOBULAR CARCINOMA IN SITU, INTERMEDIATE TO HIGH NUCLEAR GRADE. SEE NOTE. - NO EVIDENCE OF  RESIDUAL INVASIVE CARCINOMA. - RESECTION MARGINS ARE NEGATIVE FOR IN SITU OR INVASIVE CARCINOMA. - BIOPSY SITE CHANGES. - SEE ONCOLOGY TABLE. 3. Lymph node, sentinel, biopsy, Right #1 - IMMUNOHISTOCHEMICAL STAIN  FOR AE1/AE3 IS NEGATIVE FOR CARCINOMA (0/1). 4. Lymph node, sentinel, biopsy, Right - IMMUNOHISTOCHEMICAL STAIN FOR AE1/AE3 IS NEGATIVE FOR CARCINOMA (0/1). 5. Lymph node, sentinel, biopsy, Right #2 - IMMUNOHISTOCHEMICAL STAIN FOR AE1/AE3 IS NEGATIVE FOR CARCINOMA (0/1). 6. Lymph node, sentinel, biopsy, Right - IMMUNOHISTOCHEMICAL STAIN FOR AE1/AE3 IS NEGATIVE FOR CARCINOMA (0/1). 7. Lymph node, sentinel, biopsy, Right - METASTATIC CARCINOMA TO ONE LYMPH NODE (1/1) (CONFIRMED WITH IMMUNOHISTOCHEMICAL STAIN FOR AE1/AE3) - FOCUS OF METASTATIC CARCINOMA MEASURES JUST OVER 0.2 CM IN GREATEST DIMENSION - NO EVIDENCE OF EXTRANODAL EXTENSION    02/24/2019 Cancer Staging    Staging form: Breast, AJCC 8th Edition - Pathologic stage from 02/24/2019: Stage IB (pT1a, pN1a, cM0, G1, ER+, PR-, HER2-) - Signed by Tiffany Merle, MD on 03/18/2019    04/01/2019 -  Chemotherapy    adjuvant Taxol and Carboplatin every 3 weeks for 4 cycles starting in 2 weeks starting 04/01/19     CURRENT THERAPY: adjuvantDocetaxel and Cytoxanevery 3 weeks for 4 cycles starting4/16/2020 S/Snyder cycle 2 on 04/21/19   INTERVAL HISTORY: [name] returns for follow up and treatment as scheduled. She completed cycle 2 TC with Onpro on 04/21/19. She feels well today, no specific concerns. She had a few mouth sores and slight headache after last chemo. Uses warm salt water rinses, did not require magic mouthwash. Did not limit po intake. Appetite is good. Manages mild chronic constipation with medication PRN. Energy level is OK. She does not sleep well, takes [place]. She is frequently up at night with her daughter who has special needs. Did not reduce dose of steroids, but reflux is not bad. Has mild pain and swelling in knees, steroids help. Hot flashes much improved. Denies fever, chills, cough, chest pain, dyspnea, leg edema, neuropathy, or concerns with breast incisions.     MEDICAL HISTORY:  Past Medical History:  Diagnosis Date  .  Anemia   . Arthritis   . Cancer [facility])    right breast  . Family history of adverse reaction to anesthesia    sister has same issues  . GERD (gastroesophageal reflux disease)   . PONV (postoperative nausea and vomiting)     SURGICAL HISTORY: Past Surgical History:  Procedure Laterality Date  . BREAST RECONSTRUCTION WITH PLACEMENT OF TISSUE EXPANDER AND FLEX HD (ACELLULAR HYDRATED DERMIS) Bilateral 02/24/2019   Procedure: BREAST RECONSTRUCTION WITH PLACEMENT OF TISSUE EXPANDER AND FLEX HD (ACELLULAR HYDRATED DERMIS);  Surgeon: Wallace Going, DO;  Location: [facility];  Service: Plastics;  Laterality: Bilateral;  . BREAST SURGERY     left breast bx  . EYE SURGERY     dart to the eye  (right)  . FACIAL FRACTURE SURGERY    . FRACTURE SURGERY    . KNEE SURGERY    . MASTECTOMY W/ SENTINEL NODE BIOPSY Bilateral 02/24/2019   Procedure: RIGHT MASTECTOMY WITH SENTINEL LYMPH NODE MAPPING AND LEFT PROPHYLACTIC MASTECTOMY;  Surgeon: Tiffany Kussmaul, MD;  Location: [place];  Service: General;  Laterality: Bilateral;  . SHOULDER SURGERY      I have reviewed the social history and family history with the patient and they are unchanged from previous note.  ALLERGIES:  is allergic to codeine and tramadol.  MEDICATIONS:  Current Outpatient Medications  Medication Sig Dispense Refill  . acetaminophen (TYLENOL) 500  MG tablet Take 1,000 mg by mouth every 6 (six) hours as needed for moderate pain.    . Cholecalciferol (VITAMIN D3) 125 MCG (5000 UT) CAPS Take 5,000 Units by mouth daily.    . Cyanocobalamin (B-12) 2500 MCG TABS Take 2,500 mcg by mouth daily.    . cyclobenzaprine (FLEXERIL) 10 MG tablet Take 1 tablet (10 mg total) by mouth 2 (two) times daily as needed for muscle spasms. 30 tablet 0  . dexamethasone (DECADRON) 4 MG tablet Take 2 tablets (8 mg total) by mouth 2 (two) times daily. Start the day before Taxotere. Then 1 tab twice daily the day after chemo for 3 days. 30 tablet 1  . Diclofenac  Sodium CR 100 MG 24 hr tablet Take 1 tablet (100 mg total) by mouth daily. 5 tablet 0  . docusate sodium (COLACE) 100 MG capsule Take 100 mg by mouth daily.    . Iron-Vitamin C 65-125 MG TABS Take 1 tablet by mouth daily.    Marland Kitchen LORazepam (ATIVAN) 0.5 MG tablet Take 1 tablet (0.5 mg total) by mouth every 8 (eight) hours as needed for anxiety. 20 tablet 0  . Multiple Vitamin (MULTIVITAMIN WITH MINERALS) TABS tablet Take 1 tablet by mouth daily.    Marland Kitchen omeprazole (PRILOSEC) 20 MG capsule Take 20 mg by mouth daily.    . ondansetron (ZOFRAN) 8 MG tablet Take 1 tablet (8 mg total) by mouth 2 (two) times daily as needed for refractory nausea / vomiting. Start on day 3 after chemo. 30 tablet 1  . prochlorperazine (COMPAZINE) 10 MG tablet Take 1 tablet (10 mg total) by mouth every 6 (six) hours as needed (Nausea or vomiting). 30 tablet 1  . venlafaxine XR (EFFEXOR-XR) 75 MG 24 hr capsule TAKE 1 CAPSULE (75 MG TOTAL) BY MOUTH DAILY WITH BREAKFAST. 90 capsule 1  . zolpidem (AMBIEN) 5 MG tablet Take 1 tablet (5 mg total) by mouth at bedtime as needed for sleep. 30 tablet 0  . ibuprofen (ADVIL,MOTRIN) 200 MG tablet Take 600 mg by mouth every 6 (six) hours as needed for moderate pain.      No current facility-administered medications for this visit.    Facility-Administered Medications Ordered in Other Visits  Medication Dose Route Frequency Provider Last Rate Last Dose  . cyclophosphamide (CYTOXAN) 1,120 mg in sodium chloride 0.9 % 250 mL chemo infusion  600 mg/m2 (Treatment Plan Recorded) Intravenous Once Tiffany Merle, MD      . DOCEtaxel (TAXOTERE) 140 mg in sodium chloride 0.9 % 250 mL chemo infusion  75 mg/m2 (Treatment Plan Recorded) Intravenous Once Tiffany Merle, MD      . pegfilgrastim (NEULASTA ONPRO KIT) injection 6 mg  6 mg Subcutaneous Once Tiffany Merle, MD        PHYSICAL EXAMINATION: ECOG PERFORMANCE STATUS: 1 - Symptomatic but completely ambulatory  Vitals:   05/13/19 0847  BP: 114/70  Pulse: 85   Resp: 18  Temp: 97.8 F (36.6 C)  SpO2: 100%   Filed Weights   05/13/19 0847  Weight: 174 lb 9.6 oz (79.2 kg)    GENERAL:alert, no distress and comfortable SKIN: no obvious rash  EYES: sclera clear LUNGS: respirations even and unlabored  HEART:  no lower extremity edema Musculoskeletal:no cyanosis of digits  NEURO: alert & oriented x 3 with fluent speech, normal gait  Breast: s/Snyder bilat mastectomy with tissue expanders. Incisions are healed without erythema or drainage No PAC  Limited exam for covid19 outbreak  LABORATORY DATA:  I have  reviewed the data as listed CBC Latest Ref Rng & Units 05/13/2019 04/21/2019 04/01/2019  WBC 4.0 - 10.5 K/uL 18.1(H) 20.7(H) 15.1(H)  Hemoglobin 12.0 - 15.0 g/dL 10.3(L) 11.1(L) 11.3(L)  Hematocrit 36.0 - 46.0 % 31.8(L) 35.1(L) 36.1  Platelets 150 - 400 K/uL 367 450(H) 275     CMP Latest Ref Rng & Units 05/13/2019 04/21/2019 04/01/2019  Glucose 70 - 99 mg/dL 97 76 87  BUN 6 - 20 mg/dL _0 Creatinine 0.44 - 1.00 mg/dL 0.72 0.72 0.76  Sodium 135 - 145 mmol/L 138 140 137  Potassium 3.5 - 5.1 mmol/L 3.8 3.4(L) 3.5  Chloride 98 - 111 mmol/L 102 105 103  CO2 22 - 32 mmol/L _1 Calcium 8.9 - 10.3 mg/dL 10.1 9.7 10.0  Total Protein 6.5 - 8.1 g/dL 7.6 7.4 7.9  Total Bilirubin 0.3 - 1.2 mg/dL 0.3 0.3 0.3  Alkaline Phos 38 - 126 U/L 96 86 75  AST 15 - 41 U/L _2 ALT 0 - 44 U/L _3 RADIOGRAPHIC STUDIES: I have personally reviewed the radiological images as listed and agreed with the findings in the report. No results found.   ASSESSMENT & PLAN: [name] Peedenis a 53 y.o.femalewith   1.Malignant neoplasm of upper-outer quadrant of right breast,invasive lobular carcinoma, stageIA,Snyder(T1a,N1a,M0), ER10%+, PR-, HER2-Grade I - diagnosed in 12/2018, s/Snyder b/l mastectomyon3/11/20.  -She started adjuvant chemo with TC every 3 weeks for 4 cycles on 04/01/19. -She tolerated first cyclechemomoderately well with HA, hot  flashes, insomnia and acid reflux which is mostly related to her steroidsand chemo.I recommended reduced steroids to 1 tab daily x2-3 days after cycle 2 -today she appears stable. She completed cycle 2, tolerated well overall with mild mucositis and slight headache. She has recovered well today. -labs, VS reviewed; CMP is normal. CBC with elevated WBC likely related to Onpro and steroids. Labs adequate to proceed with cycle 3, no dosage adjustment -her plan is to proceed with reconstructive surgery in July after she completes chemotherapy. This will delay her adjuvant radiation which we anticipate she will begin 5-6 weeks after surgery. The oncology team suggests beginning anti-estrogen therapy in the meantime given her ER positivity.  -she is postmenopausal, LMP > 10 years ago; I briefly discussed risk and benefit of AI, and side effects including but not limited to hot flash, skin and vaginal dryness, metabolic changes ( increased blood glucose, cholesterol, weight, etc.), slightly in increased risk of cardiovascular disease, cataracts, muscular and joint discomfort, osteopenia and osteoporosis. She is interested, she will discuss further with Dr. Burr Medico at next f/u in 3 weeks for final chemo. -she has underlying hot flashes and joint discomfort in her knees, but manageable. No recent DEXA. She takes vitamin D. Will monitor closely on AI.  -f/u in 3 weeks with Dr. Burr Medico for final cycle 4 chemo  2. Genetic Testing  - appt pending due to ? Insurance coverage    3. Social support  -she has a daughter with special needs and strong nursing support at home -she has intermittent anxiety, controlled with ativan PRN  4. Right knee pain, osteoarthritis - on flexeril and tylenol PRN  -stable, she notes steroids for chemo help her knee pain/swelling  5. HA, Insomnia -likely related to steroids and chemo -insomnia better with ambien -headaches much improved when she stopped taking zofran   6. Hot  flashes -less frequent and better overall after switching to effexor  7. Acid Reflux, worsened on chemo, steroids  -takes Tums -she did not reduce dex dose after cycle 2, her reflux is not that bad; if symptoms worsen with this cycle she will try dex 1 tab 1-2 times daily after chemo instead of 2 tabs BID for 3 days    PLAN: -Labs reviewed -Proceed with cycle 3 TC with Onpro -Discuss AI with Dr. Burr Medico at next visit, anticipate starting after chemo  -F/u in 3 weeks   All questions were answered. The patient knows to call the clinic with any problems, questions or concerns. No barriers to learning was detected.     Alla Feeling, NP 05/13/19

## 2019-05-13 ENCOUNTER — Inpatient Hospital Stay: Payer: BLUE CROSS/BLUE SHIELD

## 2019-05-13 ENCOUNTER — Other Ambulatory Visit: Payer: Self-pay

## 2019-05-13 ENCOUNTER — Encounter: Payer: Self-pay | Admitting: Nurse Practitioner

## 2019-05-13 ENCOUNTER — Inpatient Hospital Stay (HOSPITAL_BASED_OUTPATIENT_CLINIC_OR_DEPARTMENT_OTHER): Payer: BLUE CROSS/BLUE SHIELD | Admitting: Nurse Practitioner

## 2019-05-13 VITALS — BP 114/70 | HR 85 | Temp 97.8°F | Resp 18 | Ht 63.0 in | Wt 174.6 lb

## 2019-05-13 DIAGNOSIS — Z170 Estrogen receptor positive status [ER+]: Secondary | ICD-10-CM

## 2019-05-13 DIAGNOSIS — K5900 Constipation, unspecified: Secondary | ICD-10-CM

## 2019-05-13 DIAGNOSIS — Z5111 Encounter for antineoplastic chemotherapy: Secondary | ICD-10-CM

## 2019-05-13 DIAGNOSIS — M25561 Pain in right knee: Secondary | ICD-10-CM

## 2019-05-13 DIAGNOSIS — K219 Gastro-esophageal reflux disease without esophagitis: Secondary | ICD-10-CM

## 2019-05-13 DIAGNOSIS — G4700 Insomnia, unspecified: Secondary | ICD-10-CM

## 2019-05-13 DIAGNOSIS — Z9013 Acquired absence of bilateral breasts and nipples: Secondary | ICD-10-CM

## 2019-05-13 DIAGNOSIS — C50411 Malignant neoplasm of upper-outer quadrant of right female breast: Secondary | ICD-10-CM | POA: Diagnosis not present

## 2019-05-13 DIAGNOSIS — R232 Flushing: Secondary | ICD-10-CM

## 2019-05-13 DIAGNOSIS — Z79899 Other long term (current) drug therapy: Secondary | ICD-10-CM

## 2019-05-13 DIAGNOSIS — Z7689 Persons encountering health services in other specified circumstances: Secondary | ICD-10-CM | POA: Diagnosis not present

## 2019-05-13 LAB — CBC WITH DIFFERENTIAL (CANCER CENTER ONLY)
Abs Immature Granulocytes: 0.12 10*3/uL — ABNORMAL HIGH (ref 0.00–0.07)
Basophils Absolute: 0 10*3/uL (ref 0.0–0.1)
Basophils Relative: 0 %
Eosinophils Absolute: 0 10*3/uL (ref 0.0–0.5)
Eosinophils Relative: 0 %
HCT: 31.8 % — ABNORMAL LOW (ref 36.0–46.0)
Hemoglobin: 10.3 g/dL — ABNORMAL LOW (ref 12.0–15.0)
Immature Granulocytes: 1 %
Lymphocytes Relative: 9 %
Lymphs Abs: 1.6 10*3/uL (ref 0.7–4.0)
MCH: 29.4 pg (ref 26.0–34.0)
MCHC: 32.4 g/dL (ref 30.0–36.0)
MCV: 90.9 fL (ref 80.0–100.0)
Monocytes Absolute: 1.8 10*3/uL — ABNORMAL HIGH (ref 0.1–1.0)
Monocytes Relative: 10 %
Neutro Abs: 14.6 10*3/uL — ABNORMAL HIGH (ref 1.7–7.7)
Neutrophils Relative %: 80 %
Platelet Count: 367 10*3/uL (ref 150–400)
RBC: 3.5 MIL/uL — ABNORMAL LOW (ref 3.87–5.11)
RDW: 15 % (ref 11.5–15.5)
WBC Count: 18.1 10*3/uL — ABNORMAL HIGH (ref 4.0–10.5)
nRBC: 0 % (ref 0.0–0.2)

## 2019-05-13 LAB — CMP (CANCER CENTER ONLY)
ALT: 24 U/L (ref 0–44)
AST: 16 U/L (ref 15–41)
Albumin: 4.1 g/dL (ref 3.5–5.0)
Alkaline Phosphatase: 96 U/L (ref 38–126)
Anion gap: 10 (ref 5–15)
BUN: 14 mg/dL (ref 6–20)
CO2: 26 mmol/L (ref 22–32)
Calcium: 10.1 mg/dL (ref 8.9–10.3)
Chloride: 102 mmol/L (ref 98–111)
Creatinine: 0.72 mg/dL (ref 0.44–1.00)
GFR, Est AFR Am: >60 mL/min (ref >60)
GFR, Estimated: >60 mL/min (ref >60)
Glucose, Bld: 97 mg/dL (ref 70–99)
Potassium: 3.8 mmol/L (ref 3.5–5.1)
Sodium: 138 mmol/L (ref 135–145)
Total Bilirubin: 0.3 mg/dL (ref 0.3–1.2)
Total Protein: 7.6 g/dL (ref 6.5–8.1)

## 2019-05-13 MED ORDER — SODIUM CHLORIDE 0.9 % IV SOLN
75.0000 mg/m2 | Freq: Once | INTRAVENOUS | Status: AC
  Administered 2019-05-13: 11:00:00 140 mg via INTRAVENOUS
  Filled 2019-05-13: qty 14

## 2019-05-13 MED ORDER — PEGFILGRASTIM 6 MG/0.6ML ~~LOC~~ PSKT
PREFILLED_SYRINGE | SUBCUTANEOUS | Status: AC
  Filled 2019-05-13: qty 0.6

## 2019-05-13 MED ORDER — PALONOSETRON HCL INJECTION 0.25 MG/5ML
0.2500 mg | Freq: Once | INTRAVENOUS | Status: AC
  Administered 2019-05-13: 10:00:00 0.25 mg via INTRAVENOUS

## 2019-05-13 MED ORDER — SODIUM CHLORIDE 0.9 % IV SOLN
600.0000 mg/m2 | Freq: Once | INTRAVENOUS | Status: AC
  Administered 2019-05-13: 12:00:00 1120 mg via INTRAVENOUS
  Filled 2019-05-13: qty 56

## 2019-05-13 MED ORDER — PEGFILGRASTIM 6 MG/0.6ML ~~LOC~~ PSKT
6.0000 mg | PREFILLED_SYRINGE | Freq: Once | SUBCUTANEOUS | Status: AC
  Administered 2019-05-13: 13:00:00 6 mg via SUBCUTANEOUS

## 2019-05-13 MED ORDER — DEXAMETHASONE SODIUM PHOSPHATE 10 MG/ML IJ SOLN
10.0000 mg | Freq: Once | INTRAMUSCULAR | Status: AC
  Administered 2019-05-13: 10 mg via INTRAVENOUS

## 2019-05-13 MED ORDER — PALONOSETRON HCL INJECTION 0.25 MG/5ML
INTRAVENOUS | Status: AC
  Filled 2019-05-13: qty 5

## 2019-05-13 MED ORDER — SODIUM CHLORIDE 0.9 % IV SOLN
Freq: Once | INTRAVENOUS | Status: AC
  Administered 2019-05-13: 10:00:00 via INTRAVENOUS
  Filled 2019-05-13: qty 250

## 2019-05-13 MED ORDER — DEXAMETHASONE SODIUM PHOSPHATE 10 MG/ML IJ SOLN
INTRAMUSCULAR | Status: AC
  Filled 2019-05-13: qty 1

## 2019-05-13 NOTE — Patient Instructions (Signed)
[facility] Discharge Instructions for Patients Receiving Chemotherapy  Today you received the following chemotherapy agents:  Cytoxan, Taxotere  To help prevent nausea and vomiting after your treatment, we encourage you to take your nausea medication as prescribed.   If you develop nausea and vomiting that is not controlled by your nausea medication, call the clinic.   BELOW ARE SYMPTOMS THAT SHOULD BE REPORTED IMMEDIATELY:  *FEVER GREATER THAN 100.5 F  *CHILLS WITH OR WITHOUT FEVER  NAUSEA AND VOMITING THAT IS NOT CONTROLLED WITH YOUR NAUSEA MEDICATION  *UNUSUAL SHORTNESS OF BREATH  *UNUSUAL BRUISING OR BLEEDING  TENDERNESS IN MOUTH AND THROAT WITH OR WITHOUT PRESENCE OF ULCERS  *URINARY PROBLEMS  *BOWEL PROBLEMS  UNUSUAL RASH Items with * indicate a potential emergency and should be followed up as soon as possible.  Feel free to call the clinic should you have any questions or concerns. The clinic phone number is [phone].  Please show the [facility] at check-in to the Emergency Department and triage nurse.  Pegfilgrastim injection What is this medicine? PEGFILGRASTIM (PEG fil gra stim) is a long-acting granulocyte colony-stimulating factor that stimulates the growth of neutrophils, a type of white blood cell important in the body's fight against infection. It is used to reduce the incidence of fever and infection in patients with certain types of cancer who are receiving chemotherapy that affects the bone marrow, and to increase survival after being exposed to high doses of radiation. This medicine may be used for other purposes; ask your health care provider or pharmacist if you have questions. COMMON BRAND NAME(S): Domenic Moras, UDENYCA What should I tell my health care provider before I take this medicine? They need to know if you have any of these conditions: -kidney disease -latex allergy -ongoing radiation therapy -sickle cell  disease -skin reactions to acrylic adhesives (On-Body Injector only) -an unusual or allergic reaction to pegfilgrastim, filgrastim, other medicines, foods, dyes, or preservatives -pregnant or trying to get pregnant -breast-feeding How should I use this medicine? This medicine is for injection under the skin. If you get this medicine at home, you will be taught how to prepare and give the pre-filled syringe or how to use the On-body Injector. Refer to the patient Instructions for Use for detailed instructions. Use exactly as directed. Tell your healthcare provider immediately if you suspect that the On-body Injector may not have performed as intended or if you suspect the use of the On-body Injector resulted in a missed or partial dose. It is important that you put your used needles and syringes in a special sharps container. Do not put them in a trash can. If you do not have a sharps container, call your pharmacist or healthcare provider to get one. Talk to your pediatrician regarding the use of this medicine in children. While this drug may be prescribed for selected conditions, precautions do apply. Overdosage: If you think you have taken too much of this medicine contact a poison control center or emergency room at once. NOTE: This medicine is only for you. Do not share this medicine with others. What if I miss a dose? It is important not to miss your dose. Call your doctor or health care professional if you miss your dose. If you miss a dose due to an On-body Injector failure or leakage, a new dose should be administered as soon as possible using a single prefilled syringe for manual use. What may interact with this medicine? Interactions have not been  studied. Give your health care provider a list of all the medicines, herbs, non-prescription drugs, or dietary supplements you use. Also tell them if you smoke, drink alcohol, or use illegal drugs. Some items may interact with your medicine. This  list may not describe all possible interactions. Give your health care provider a list of all the medicines, herbs, non-prescription drugs, or dietary supplements you use. Also tell them if you smoke, drink alcohol, or use illegal drugs. Some items may interact with your medicine. What should I watch for while using this medicine? You may need blood work done while you are taking this medicine. If you are going to need a MRI, CT scan, or other procedure, tell your doctor that you are using this medicine (On-Body Injector only). What side effects may I notice from receiving this medicine? Side effects that you should report to your doctor or health care professional as soon as possible: -allergic reactions like skin rash, itching or hives, swelling of the face, lips, or tongue -back pain -dizziness -fever -pain, redness, or irritation at site where injected -pinpoint red spots on the skin -red or dark-brown urine -shortness of breath or breathing problems -stomach or side pain, or pain at the shoulder -swelling -tiredness -trouble passing urine or change in the amount of urine Side effects that usually do not require medical attention (report to your doctor or health care professional if they continue or are bothersome): -bone pain -muscle pain This list may not describe all possible side effects. Call your doctor for medical advice about side effects. You may report side effects to FDA at 1-800-FDA-1088. Where should I keep my medicine? Keep out of the reach of children. If you are using this medicine at home, you will be instructed on how to store it. Throw away any unused medicine after the expiration date on the label. NOTE: This sheet is a summary. It may not cover all possible information. If you have questions about this medicine, talk to your doctor, pharmacist, or health care provider.  2019 Elsevier/Gold Standard (2018-03-09 16:57:08)  Coronavirus (COVID-19) Are you at  risk?  Are you at risk for the Coronavirus (COVID-19)?  To be considered HIGH RISK for Coronavirus (COVID-19), you have to meet the following criteria:  . Traveled to [place], [place], [place], [place] or [place]; or in the [place] to [place], [employer], [place], or [place]; and have fever, cough, and shortness of breath within the last 2 weeks of travel OR . Been in close contact with a person diagnosed with COVID-19 within the last 2 weeks and have fever, cough, and shortness of breath . IF YOU DO NOT MEET THESE CRITERIA, YOU ARE CONSIDERED LOW RISK FOR COVID-19.  What to do if you are HIGH RISK for COVID-19?  Marland Kitchen If you are having a medical emergency, call 911. . Seek medical care right away. Before you go to a doctor's office, urgent care or emergency department, call ahead and tell them about your recent travel, contact with someone diagnosed with COVID-19, and your symptoms. You should receive instructions from your physician's office regarding next steps of care.  . When you arrive at healthcare provider, tell the healthcare staff immediately you have returned from visiting [place], [place], [place], [place] or [place]; or traveled in the [place] to [place], [place], [place], or [place]; in the last two weeks or you have been in close contact with a person diagnosed with COVID-19 in the last 2  weeks.   . Tell the health care staff about your symptoms: fever, cough and shortness of breath. . After you have been seen by a medical provider, you will be either: o Tested for (COVID-19) and discharged home on quarantine except to seek medical care if symptoms worsen, and asked to  - Stay home and avoid contact with others until you get your results (4-5 days)  - Avoid travel on public transportation if possible (such as bus, train, or airplane) or o Sent to the Emergency Department by EMS for evaluation, COVID-19 testing, and possible admission depending on your condition and  test results.  What to do if you are LOW RISK for COVID-19?  Reduce your risk of any infection by using the same precautions used for avoiding the common cold or flu:  Marland Kitchen Wash your hands often with soap and warm water for at least 20 seconds.  If soap and water are not readily available, use an alcohol-based hand sanitizer with at least 60% alcohol.  . If coughing or sneezing, cover your mouth and nose by coughing or sneezing into the elbow areas of your shirt or coat, into a tissue or into your sleeve (not your hands). . Avoid shaking hands with others and consider head nods or verbal greetings only. . Avoid touching your eyes, nose, or mouth with unwashed hands.  . Avoid close contact with people who are sick. . Avoid places or events with large numbers of people in one location, like concerts or sporting events. . Carefully consider travel plans you have or are making. . If you are planning any travel outside or inside the [place], visit the CDC's Travelers' Health webpage for the latest health notices. . If you have some symptoms but not all symptoms, continue to monitor at home and seek medical attention if your symptoms worsen. . If you are having a medical emergency, call 911.   [facility] / e-Visit: [email or web address]         [facility]: [facility]: [phone]                   [facility]: [phone]

## 2019-05-14 ENCOUNTER — Telehealth: Payer: Self-pay | Admitting: Nurse Practitioner

## 2019-05-14 NOTE — Telephone Encounter (Signed)
No los per 5/28. °

## 2019-05-27 ENCOUNTER — Other Ambulatory Visit: Payer: Self-pay | Admitting: *Deleted

## 2019-05-27 ENCOUNTER — Telehealth: Payer: Self-pay | Admitting: Plastic Surgery

## 2019-05-27 MED ORDER — ZOLPIDEM TARTRATE 5 MG PO TABS: 5.0000 mg | ORAL_TABLET | Freq: Every evening | ORAL | 1 refills | Status: DC | PRN

## 2019-05-27 NOTE — Telephone Encounter (Signed)

## 2019-05-28 ENCOUNTER — Encounter: Payer: Self-pay | Admitting: Plastic Surgery

## 2019-05-28 ENCOUNTER — Other Ambulatory Visit: Payer: Self-pay

## 2019-05-28 ENCOUNTER — Ambulatory Visit: Payer: BC Managed Care – PPO | Admitting: Plastic Surgery

## 2019-05-28 VITALS — BP 123/82 | HR 100 | Temp 98.3°F | Ht 63.0 in | Wt 173.8 lb

## 2019-05-28 DIAGNOSIS — C50411 Malignant neoplasm of upper-outer quadrant of right female breast: Secondary | ICD-10-CM

## 2019-05-28 DIAGNOSIS — Z170 Estrogen receptor positive status [ER+]: Secondary | ICD-10-CM

## 2019-05-28 DIAGNOSIS — Z9013 Acquired absence of bilateral breasts and nipples: Secondary | ICD-10-CM

## 2019-05-28 NOTE — Progress Notes (Signed)
   Subjective:    Patient ID: [name], female    DOB: 08-11-1966, 53 y.o.   MRN: [id number]  The patient is a 53 year old female here for follow-up on her bilateral breast reconstruction she is doing very well and pleased with her progress.  All incisions are healing nicely.  She is happy with her size.  She is looking forward to the exchange.  There is nothing of concern at this point in time.     Review of Systems  Constitutional: Negative.   HENT: Negative.   Respiratory: Negative.   Cardiovascular: Negative.   Musculoskeletal: Negative.        Objective:   Physical Exam Vitals signs and nursing note reviewed.  Constitutional:      Appearance: Normal appearance.  HENT:     Head: Normocephalic and atraumatic.  Cardiovascular:     Rate and Rhythm: Normal rate.  Pulmonary:     Effort: Pulmonary effort is normal.  Neurological:     General: No focal deficit present.     Mental Status: She is alert. Mental status is at baseline.  Psychiatric:        Mood and Affect: Mood normal.        Behavior: Behavior normal.         Assessment & Plan:     ICD-10-CM   1. Acquired absence of bilateral breasts and nipples  Z90.13   2. Malignant neoplasm of upper-outer quadrant of right breast in female, estrogen receptor positive ([place])  C50.411    Z17.0     We placed injectable saline in the Expander using a sterile technique: Right: 50 cc for a total of 450 / 535 cc Left: 50 cc for a total of 450 / 535 cc

## 2019-05-31 NOTE — Progress Notes (Signed)
[facility]   Telephone:([phone] Fax:(336) [phone]   Clinic Follow up Note   Patient Care Team: Merwyn Katos as PCP - General (Physician Assistant) Jovita Kussmaul, MD as Consulting Physician (General Surgery) Truitt Merle, MD as Consulting Physician (Hematology) Gery Pray, MD as Consulting Physician (Radiation Oncology) Rockwell Germany, RN as Oncology Nurse Navigator Mauro Kaufmann, RN as Oncology Nurse Navigator  Date of Service:  06/03/2019  CHIEF COMPLAINT: F/u of right breast cancer  SUMMARY OF ONCOLOGIC HISTORY: Oncology History Overview Note  Cancer Staging Malignant neoplasm of upper-outer quadrant of right breast in female, estrogen receptor positive ([place]) Staging form: Breast, AJCC 8th Edition - Clinical stage from 01/15/2019: Stage IA (cT1b, cN0, cM0, G1, ER+, PR-, HER2-) - Signed by Truitt Merle, MD on 01/26/2019 - Pathologic stage from 02/24/2019: Stage IB (pT1a, pN1a, cM0, G1, ER+, PR-, HER2-) - Signed by Truitt Merle, MD on 03/18/2019     Malignant neoplasm of upper-outer quadrant of right breast in female, estrogen receptor positive ([place])  01/15/2019 Cancer Staging   Staging form: Breast, AJCC 8th Edition - Clinical stage from 01/15/2019: Stage IA (cT1b, cN0, cM0, G1, ER+, PR-, HER2-) - Signed by Truitt Merle, MD on 01/26/2019   01/15/2019 Mammogram   Diagnostic Mammogram 01/15/19  IMPRESSION Targeted ultrasound is performed, showing right breast 10 o'clock 5 cm from the nipple hypoechoic round mass measuring 0.7 x 0.5 x 0.6 cm. Adjacent to it there is a probable complicated cyst measuring 4 mm. There is no evidence of right axillary lymphadenopathy.   01/15/2019 Initial Biopsy   Diagnosis 01/15/19  Breast, right, needle core biopsy, upper outer - INVASIVE AND IN SITU MAMMARY CARCINOMA WITH CALCIFICATIONS. - SEE COMMENT. 1 of 3 FINAL for Snyder, [name] ([id number]) Microscopic Comment The carcinoma appears grade I. The malignant cells  are positive for cytokeratin AE1/AE3 and negative for E-cadherin. A breast prognostic profile will be performed and the results reported separately. Dr Vicente Males has reviewed the case and concurs with this interpretation. The results are called to [facility] on 01/18/2019. (JBK:ah:ecj 01/18/19)   01/15/2019 Receptors her2   PROGNOSTIC INDICATORS Results: IMMUNOHISTOCHEMICAL AND MORPHOMETRIC ANALYSIS PERFORMED MANUALLY The tumor cells are NEGATIVE for Her2 (0). Estrogen Receptor: 10%, POSITIVE, MODERATE STAINING INTENSITY Progesterone Receptor: 0%, NEGATIVE Proliferation Marker Ki67: <1%   01/21/2019 Initial Diagnosis   Malignant neoplasm of upper-outer quadrant of right breast in female, estrogen receptor positive ([place])   01/29/2019 Breast MRI   IMPRESSION: 1. The biopsy-proven site of malignancy in the upper-outer quadrant of the right breast resides centrally with in an area of suspicious non mass enhancement spanning 6-7 cm.  2. There is a 6 mm suspicious enhancing mass in the lower-inner quadrant of the right breast, posterior depth.  3. There is a 9 mm indeterminate enhancing mass in the lower outer quadrant of the right breast.  4.  No evidence of malignancy in the left breast.   02/24/2019 Surgery   RIGHT MASTECTOMY WITH SENTINEL LYMPH NODE MAPPING AND LEFT PROPHYLACTIC MASTECTOMY by Dr. Marlou Starks  BREAST RECONSTRUCTION WITH PLACEMENT OF TISSUE EXPANDER by Dr. Marla Roe  02/24/19    02/24/2019 Pathology Results   Diagnosis 1. Breast, simple mastectomy, Left - BENIGN BREAST PARENCHYMA WITH FIBROCYSTIC CHANGE. - NEGATIVE FOR CARCINOMA. 2. Breast, simple mastectomy, Right - LOBULAR CARCINOMA IN SITU, INTERMEDIATE TO HIGH NUCLEAR GRADE. SEE NOTE. - NO EVIDENCE OF RESIDUAL INVASIVE CARCINOMA. - RESECTION MARGINS ARE NEGATIVE FOR IN SITU  OR INVASIVE CARCINOMA. - BIOPSY SITE CHANGES. - SEE ONCOLOGY TABLE. 3. Lymph node, sentinel, biopsy, Right #1 -  IMMUNOHISTOCHEMICAL STAIN FOR AE1/AE3 IS NEGATIVE FOR CARCINOMA (0/1). 4. Lymph node, sentinel, biopsy, Right - IMMUNOHISTOCHEMICAL STAIN FOR AE1/AE3 IS NEGATIVE FOR CARCINOMA (0/1). 5. Lymph node, sentinel, biopsy, Right #2 - IMMUNOHISTOCHEMICAL STAIN FOR AE1/AE3 IS NEGATIVE FOR CARCINOMA (0/1). 6. Lymph node, sentinel, biopsy, Right - IMMUNOHISTOCHEMICAL STAIN FOR AE1/AE3 IS NEGATIVE FOR CARCINOMA (0/1). 7. Lymph node, sentinel, biopsy, Right - METASTATIC CARCINOMA TO ONE LYMPH NODE (1/1) (CONFIRMED WITH IMMUNOHISTOCHEMICAL STAIN FOR AE1/AE3) - FOCUS OF METASTATIC CARCINOMA MEASURES JUST OVER 0.2 CM IN GREATEST DIMENSION - NO EVIDENCE OF EXTRANODAL EXTENSION   02/24/2019 Cancer Staging   Staging form: Breast, AJCC 8th Edition - Pathologic stage from 02/24/2019: Stage IB (pT1a, pN1a, cM0, G1, ER+, PR-, HER2-) - Signed by Truitt Merle, MD on 03/18/2019   04/01/2019 - 06/03/2019 Chemotherapy   adjuvant Taxol and Carboplatin every 3 weeks for 4 cycles starting in 2 weeks starting 04/01/19-06/03/19    Anti-estrogen oral therapy   Anastrozole 12m daily starting 06/2019      CURRENT THERAPY:  adjuvantDocetaxel and Cytoxanevery 3 weeks for 4 cycles 04/01/2019-06/03/19  Anastrozole 16mdaily starting 06/2019 PENDING radiation    INTERVAL HISTORY:  Tiffany TESCHNERs here for a follow up and treatment. She presents to the clinic alone. She notes she is doing well. She denied sickness but notes a few bumps on her tongue. She did not mild diarrhea. This was manageable and resolved on its own. She has novasure procedure and has not had a period in 10 years since. She notes her Gyn told her she was in menopause before she was diagnosed with breast cancer.  She notes her hot flashes have much improved on Effexor.     REVIEW OF SYSTEMS:   Constitutional: Denies fevers, chills or abnormal weight loss (+) hot flashes, much improved  Eyes: Denies blurriness of vision Ears, nose, mouth, throat, and face:  Denies mucositis or sore throat (+) bumps/sores of tongue  Respiratory: Denies cough, dyspnea or wheezes Cardiovascular: Denies palpitation, chest discomfort or lower extremity swelling Gastrointestinal:  Denies nausea, heartburn or change in bowel habits Skin: Denies abnormal skin rashes Lymphatics: Denies new lymphadenopathy or easy bruising Neurological:Denies numbness, tingling or new weaknesses Behavioral/Psych: Mood is stable, no new changes  All other systems were reviewed with the patient and are negative.  MEDICAL HISTORY:  Past Medical History:  Diagnosis Date  . Anemia   . Arthritis   . [facility]   right breast  . Family history of adverse reaction to anesthesia    sister has same issues  . GERD (gastroesophageal reflux disease)   . PONV (postoperative nausea and vomiting)     SURGICAL HISTORY: Past Surgical History:  Procedure Laterality Date  . BREAST RECONSTRUCTION WITH PLACEMENT OF TISSUE EXPANDER AND FLEX HD (ACELLULAR HYDRATED DERMIS) Bilateral 02/24/2019   Procedure: BREAST RECONSTRUCTION WITH PLACEMENT OF TISSUE EXPANDER AND FLEX HD (ACELLULAR HYDRATED DERMIS);  Surgeon: DiWallace GoingDO;  [facility] Service: [facility];  Laterality: Bilateral;  . BREAST SURGERY     left breast bx  . EYE SURGERY     dart to the eye  (right)  . FACIAL FRACTURE SURGERY    . FRACTURE SURGERY    . KNEE SURGERY    . MASTECTOMY W/ SENTINEL NODE BIOPSY Bilateral 02/24/2019   Procedure: RIGHT MASTECTOMY WITH SENTINEL LYMPH NODE MAPPING AND LEFT  PROPHYLACTIC MASTECTOMY;  Surgeon: Jovita Kussmaul, MD;  Location: [facility];  Service: General;  Laterality: Bilateral;  . SHOULDER SURGERY      I have reviewed the social history and family history with the patient and they are unchanged from previous note.  ALLERGIES:  is allergic to codeine and tramadol.  MEDICATIONS:  Current Outpatient Medications  Medication Sig Dispense Refill  . acetaminophen (TYLENOL) 500 MG tablet  Take 1,000 mg by mouth every 6 (six) hours as needed for moderate pain.    . Cholecalciferol (VITAMIN D3) 125 MCG (5000 UT) CAPS Take 5,000 Units by mouth daily.    . Cyanocobalamin (B-12) 2500 MCG TABS Take 2,500 mcg by mouth daily.    . cyclobenzaprine (FLEXERIL) 10 MG tablet Take 1 tablet (10 mg total) by mouth 2 (two) times daily as needed for muscle spasms. 30 tablet 0  . dexamethasone (DECADRON) 4 MG tablet Take 2 tablets (8 mg total) by mouth 2 (two) times daily. Start the day before Taxotere. Then 1 tab twice daily the day after chemo for 3 days. 30 tablet 1  . Diclofenac Sodium CR 100 MG 24 hr tablet Take 1 tablet (100 mg total) by mouth daily. 5 tablet 0  . docusate sodium (COLACE) 100 MG capsule Take 100 mg by mouth daily.    Marland Kitchen ibuprofen (ADVIL,MOTRIN) 200 MG tablet Take 600 mg by mouth every 6 (six) hours as needed for moderate pain.     . Iron-Vitamin C 65-125 MG TABS Take 1 tablet by mouth daily.    Marland Kitchen LORazepam (ATIVAN) 0.5 MG tablet Take 1 tablet (0.5 mg total) by mouth every 8 (eight) hours as needed for anxiety. 20 tablet 0  . Multiple Vitamin (MULTIVITAMIN WITH MINERALS) TABS tablet Take 1 tablet by mouth daily.    Marland Kitchen omeprazole (PRILOSEC) 20 MG capsule Take 20 mg by mouth daily.    . ondansetron (ZOFRAN) 8 MG tablet Take 1 tablet (8 mg total) by mouth 2 (two) times daily as needed for refractory nausea / vomiting. Start on day 3 after chemo. 30 tablet 1  . prochlorperazine (COMPAZINE) 10 MG tablet Take 1 tablet (10 mg total) by mouth every 6 (six) hours as needed (Nausea or vomiting). 30 tablet 1  . venlafaxine XR (EFFEXOR-XR) 75 MG 24 hr capsule TAKE 1 CAPSULE (75 MG TOTAL) BY MOUTH DAILY WITH BREAKFAST. 90 capsule 1  . zolpidem (AMBIEN) 5 MG tablet Take 1 tablet (5 mg total) by mouth at bedtime as needed for sleep. 30 tablet 1  . anastrozole (ARIMIDEX) 1 MG tablet Take 1 tablet (1 mg total) by mouth daily. 30 tablet 2   No current facility-administered medications for this  visit.     PHYSICAL EXAMINATION: ECOG PERFORMANCE STATUS: 1 - Symptomatic but completely ambulatory  Vitals:   06/03/19 0939  BP: 128/80  Pulse: 98  Resp: 18  Temp: 97.9 F (36.6 C)  SpO2: 99%   Filed Weights   06/03/19 0939  Weight: 173 lb 12.8 oz (78.8 kg)    GENERAL:alert, no distress and comfortable SKIN: skin color, texture, turgor are normal, no rashes or significant lesions EYES: normal, Conjunctiva are pink and non-injected, sclera clear  NECK: supple, thyroid normal size, non-tender, without nodularity LYMPH:  no palpable lymphadenopathy in the cervical, axillary  LUNGS: clear to auscultation and percussion with normal breathing effort HEART: regular rate & rhythm and no murmurs and no lower extremity edema ABDOMEN:abdomen soft, non-tender and normal bowel sounds Musculoskeletal:no cyanosis of digits  and no clubbing  NEURO: alert & oriented x 3 with fluent speech, no focal motor/sensory deficits  LABORATORY DATA:  I have reviewed the data as listed CBC Latest Ref Rng & Units 06/03/2019 05/13/2019 04/21/2019  WBC 4.0 - 10.5 K/uL 8.9 18.1(H) 20.7(H)  Hemoglobin 12.0 - 15.0 g/dL 10.4(L) 10.3(L) 11.1(L)  Hematocrit 36.0 - 46.0 % 32.5(L) 31.8(L) 35.1(L)  Platelets 150 - 400 K/uL 339 367 450(H)     CMP Latest Ref Rng & Units 06/03/2019 05/13/2019 04/21/2019  Glucose 70 - 99 mg/dL 96 97 76  BUN 6 - 20 mg/dL _0 Creatinine 0.44 - 1.00 mg/dL 0.72 0.72 0.72  Sodium 135 - 145 mmol/L 140 138 140  Potassium 3.5 - 5.1 mmol/L 3.3(L) 3.8 3.4(L)  Chloride 98 - 111 mmol/L 105 102 105  CO2 22 - 32 mmol/L _1 Calcium 8.9 - 10.3 mg/dL 10.1 10.1 9.7  Total Protein 6.5 - 8.1 g/dL 7.4 7.6 7.4  Total Bilirubin 0.3 - 1.2 mg/dL 0.4 0.3 0.3  Alkaline Phos 38 - 126 U/L 70 96 86  AST 15 - 41 U/L 14(L) 16 16  ALT 0 - 44 U/L _2 RADIOGRAPHIC STUDIES: I have personally reviewed the radiological images as listed and agreed with the findings in the report. No results  found.   ASSESSMENT & PLAN:  IWALANI TEMPLETON is a 53 y.o. female with   1.Malignant neoplasm of upper-outer quadrant of right breast,invasive lobular carcinoma, stageIA,Snyder(T1a,N1a,M0), ER10%+, PR-, HER2-Grade I -She was diagnosed in 12/2018. She is s/Snyder b/l mastectomyon3/11/20.  -Idiscussedthe pathology results with her in great detail which shows her small tumor was removed by biopsy alone and there were no cancer cells found inher mastectomy sample. Her margins were negative, however she has 1/5 positive lymph nodes.  -She started adjuvant chemo with TC every 3 weeks for 4 cycles on 04/01/19. She tolerates moderately well with HA, hot flashes, insomnia and acid reflux which is mostly related to her steroids and chemo.  -Labs reviewed, CBC and CMP WNL except hg 10.4, K3.3. Overall adequate to proceed with last cycle TC today.  -She will proceed with breast reconstruction surgery on 06/30/19 then proceed with radiation therapy after she heals for surgery.  -In interim I will start her on antiestrogen therapy given her ER positive disease for 7-10 years. I will contact [facility] Dr Allayne Stack for her lab records. If she is truly post-menopausal I will start her on Anastrozole after reconstruction surgery. I reviewed side effects include reduction of metabolism, weaken her bone, cause joint pain and exacerbate her hot flashes. She agreed to proceed.  -F/u in 2 months   2. Genetic Testing  -She does note some family history for malignancy so I will see if her insurance will cover testing as she is interested.   3. Social support  -She takes care of her daughter full time who is on total care and ventilator  -she has a Marine scientist for 5 days of total care for her daughter.  -She has good family support  4. Right knee pain, osteoarthritis   -Musculoskeletal/arthritic pain  -On Flexeril and Tylenol as needed for arthritis.  -Improved lately, stable   5. HA, Insomnia  -Insomnia  is probably related to steroids which also caused her HA. -I previously called in Ativan and reduced her Dexa dose.  -I discussed Zofran can cause HA. I encouraged her to use compazine for now instead  6. Hot flashes -She is currently on Prozac, hot flush got worse after chemo  -She previously switched Prozac to Effexor. Her hot flashes have much improved.   7. Acid Reflux  -It got worse after chemo.  -I dicussed Acid reflux is likely related to steroids. She will increase Prilosec to twice daily and use zantac as needed.   8. Bone Health  -I discussed anastrozole can weaken her bone.  -Will obtain baseline DEXA this year    PLAN: -Labs reviewed and adequate to proceed with last cycle TC today  -I called in Anastrozole today to start 1-2 weeks after breast reconstruction.  -Proceed with reconstruction surgery next month then Radiation therapy  -Lab and f/u in 2 months     No problem-specific Assessment & Plan notes found for this encounter.   No orders of the defined types were placed in this encounter.  All questions were answered. The patient knows to call the clinic with any problems, questions or concerns. No barriers to learning was detected. I spent 20 minutes counseling the patient face to face. The total time spent in the appointment was 25 minutes and more than 50% was on counseling and review of test results     Truitt Merle, MD 06/03/2019   I, Joslyn Devon, am acting as scribe for Truitt Merle, MD.   I have reviewed the above documentation for accuracy and completeness, and I agree with the above.

## 2019-06-03 ENCOUNTER — Other Ambulatory Visit: Payer: Self-pay

## 2019-06-03 ENCOUNTER — Inpatient Hospital Stay: Payer: BC Managed Care – PPO | Admitting: Hematology

## 2019-06-03 ENCOUNTER — Telehealth: Payer: Self-pay | Admitting: *Deleted

## 2019-06-03 ENCOUNTER — Inpatient Hospital Stay: Payer: BC Managed Care – PPO

## 2019-06-03 ENCOUNTER — Inpatient Hospital Stay: Payer: BC Managed Care – PPO | Attending: Hematology

## 2019-06-03 VITALS — BP 128/80 | HR 98 | Temp 97.9°F | Resp 18 | Ht 63.0 in | Wt 173.8 lb

## 2019-06-03 DIAGNOSIS — R232 Flushing: Secondary | ICD-10-CM | POA: Diagnosis not present

## 2019-06-03 DIAGNOSIS — C50411 Malignant neoplasm of upper-outer quadrant of right female breast: Secondary | ICD-10-CM

## 2019-06-03 DIAGNOSIS — M1990 Unspecified osteoarthritis, unspecified site: Secondary | ICD-10-CM | POA: Diagnosis not present

## 2019-06-03 DIAGNOSIS — M129 Arthropathy, unspecified: Secondary | ICD-10-CM | POA: Diagnosis not present

## 2019-06-03 DIAGNOSIS — Z79811 Long term (current) use of aromatase inhibitors: Secondary | ICD-10-CM | POA: Diagnosis not present

## 2019-06-03 DIAGNOSIS — Z79899 Other long term (current) drug therapy: Secondary | ICD-10-CM | POA: Diagnosis not present

## 2019-06-03 DIAGNOSIS — M25561 Pain in right knee: Secondary | ICD-10-CM

## 2019-06-03 DIAGNOSIS — Z7689 Persons encountering health services in other specified circumstances: Secondary | ICD-10-CM | POA: Diagnosis not present

## 2019-06-03 DIAGNOSIS — Z5111 Encounter for antineoplastic chemotherapy: Secondary | ICD-10-CM | POA: Diagnosis not present

## 2019-06-03 DIAGNOSIS — D649 Anemia, unspecified: Secondary | ICD-10-CM | POA: Insufficient documentation

## 2019-06-03 DIAGNOSIS — K219 Gastro-esophageal reflux disease without esophagitis: Secondary | ICD-10-CM | POA: Diagnosis not present

## 2019-06-03 DIAGNOSIS — Z170 Estrogen receptor positive status [ER+]: Secondary | ICD-10-CM

## 2019-06-03 LAB — CMP (CANCER CENTER ONLY)
ALT: 14 U/L (ref 0–44)
AST: 14 U/L — ABNORMAL LOW (ref 15–41)
Albumin: 4.1 g/dL (ref 3.5–5.0)
Alkaline Phosphatase: 70 U/L (ref 38–126)
Anion gap: 9 (ref 5–15)
BUN: 14 mg/dL (ref 6–20)
CO2: 26 mmol/L (ref 22–32)
Calcium: 10.1 mg/dL (ref 8.9–10.3)
Chloride: 105 mmol/L (ref 98–111)
Creatinine: 0.72 mg/dL (ref 0.44–1.00)
GFR, Est AFR Am: >60 mL/min (ref >60)
GFR, Estimated: >60 mL/min (ref >60)
Glucose, Bld: 96 mg/dL (ref 70–99)
Potassium: 3.3 mmol/L — ABNORMAL LOW (ref 3.5–5.1)
Sodium: 140 mmol/L (ref 135–145)
Total Bilirubin: 0.4 mg/dL (ref 0.3–1.2)
Total Protein: 7.4 g/dL (ref 6.5–8.1)

## 2019-06-03 LAB — CBC WITH DIFFERENTIAL (CANCER CENTER ONLY)
Abs Immature Granulocytes: 0.04 10*3/uL (ref 0.00–0.07)
Basophils Absolute: 0 10*3/uL (ref 0.0–0.1)
Basophils Relative: 0 %
Eosinophils Absolute: 0 10*3/uL (ref 0.0–0.5)
Eosinophils Relative: 0 %
HCT: 32.5 % — ABNORMAL LOW (ref 36.0–46.0)
Hemoglobin: 10.4 g/dL — ABNORMAL LOW (ref 12.0–15.0)
Immature Granulocytes: 0 %
Lymphocytes Relative: 14 %
Lymphs Abs: 1.2 10*3/uL (ref 0.7–4.0)
MCH: 29.6 pg (ref 26.0–34.0)
MCHC: 32 g/dL (ref 30.0–36.0)
MCV: 92.6 fL (ref 80.0–100.0)
Monocytes Absolute: 1 10*3/uL (ref 0.1–1.0)
Monocytes Relative: 11 %
Neutro Abs: 6.7 10*3/uL (ref 1.7–7.7)
Neutrophils Relative %: 75 %
Platelet Count: 339 10*3/uL (ref 150–400)
RBC: 3.51 MIL/uL — ABNORMAL LOW (ref 3.87–5.11)
RDW: 15.9 % — ABNORMAL HIGH (ref 11.5–15.5)
WBC Count: 8.9 10*3/uL (ref 4.0–10.5)
nRBC: 0 % (ref 0.0–0.2)

## 2019-06-03 MED ORDER — PALONOSETRON HCL INJECTION 0.25 MG/5ML
INTRAVENOUS | Status: AC
  Filled 2019-06-03: qty 5

## 2019-06-03 MED ORDER — PEGFILGRASTIM 6 MG/0.6ML ~~LOC~~ PSKT
PREFILLED_SYRINGE | SUBCUTANEOUS | Status: AC
  Filled 2019-06-03: qty 0.6

## 2019-06-03 MED ORDER — PALONOSETRON HCL INJECTION 0.25 MG/5ML
0.2500 mg | Freq: Once | INTRAVENOUS | Status: AC
  Administered 2019-06-03: 11:00:00 0.25 mg via INTRAVENOUS

## 2019-06-03 MED ORDER — DEXAMETHASONE SODIUM PHOSPHATE 10 MG/ML IJ SOLN
INTRAMUSCULAR | Status: AC
  Filled 2019-06-03: qty 1

## 2019-06-03 MED ORDER — ANASTROZOLE 1 MG PO TABS: 1.0000 mg | ORAL_TABLET | Freq: Every day | ORAL | 2 refills | Status: DC

## 2019-06-03 MED ORDER — DEXAMETHASONE SODIUM PHOSPHATE 10 MG/ML IJ SOLN
10.0000 mg | Freq: Once | INTRAMUSCULAR | Status: AC
  Administered 2019-06-03: 10 mg via INTRAVENOUS

## 2019-06-03 MED ORDER — SODIUM CHLORIDE 0.9 % IV SOLN
Freq: Once | INTRAVENOUS | Status: AC
  Administered 2019-06-03: 10:00:00 via INTRAVENOUS
  Filled 2019-06-03: qty 250

## 2019-06-03 MED ORDER — SODIUM CHLORIDE 0.9 % IV SOLN
600.0000 mg/m2 | Freq: Once | INTRAVENOUS | Status: AC
  Administered 2019-06-03: 13:00:00 1120 mg via INTRAVENOUS
  Filled 2019-06-03: qty 56

## 2019-06-03 MED ORDER — PEGFILGRASTIM 6 MG/0.6ML ~~LOC~~ PSKT
6.0000 mg | PREFILLED_SYRINGE | Freq: Once | SUBCUTANEOUS | Status: AC
  Administered 2019-06-03: 6 mg via SUBCUTANEOUS

## 2019-06-03 MED ORDER — SODIUM CHLORIDE 0.9 % IV SOLN
75.0000 mg/m2 | Freq: Once | INTRAVENOUS | Status: AC
  Administered 2019-06-03: 12:00:00 140 mg via INTRAVENOUS
  Filled 2019-06-03: qty 14

## 2019-06-03 NOTE — Patient Instructions (Signed)
[facility] Discharge Instructions for Patients Receiving Chemotherapy  Today you received the following chemotherapy agents:  Cytoxan and Taxotere.  To help prevent nausea and vomiting after your treatment, we encourage you to take your nausea medication as prescribed.   If you develop nausea and vomiting that is not controlled by your nausea medication, call the clinic.   BELOW ARE SYMPTOMS THAT SHOULD BE REPORTED IMMEDIATELY:  *FEVER GREATER THAN 100.5 F  *CHILLS WITH OR WITHOUT FEVER  NAUSEA AND VOMITING THAT IS NOT CONTROLLED WITH YOUR NAUSEA MEDICATION  *UNUSUAL SHORTNESS OF BREATH  *UNUSUAL BRUISING OR BLEEDING  TENDERNESS IN MOUTH AND THROAT WITH OR WITHOUT PRESENCE OF ULCERS  *URINARY PROBLEMS  *BOWEL PROBLEMS  UNUSUAL RASH Items with * indicate a potential emergency and should be followed up as soon as possible.  Feel free to call the clinic should you have any questions or concerns. The clinic phone number is [phone].  Please show the [facility] at check-in to the Emergency Department and triage nurse.  Pegfilgrastim injection What is this medicine? PEGFILGRASTIM (PEG fil gra stim) is a long-acting granulocyte colony-stimulating factor that stimulates the growth of neutrophils, a type of white blood cell important in the body's fight against infection. It is used to reduce the incidence of fever and infection in patients with certain types of cancer who are receiving chemotherapy that affects the bone marrow, and to increase survival after being exposed to high doses of radiation. This medicine may be used for other purposes; ask your health care provider or pharmacist if you have questions. COMMON BRAND NAME(S): Domenic Moras, UDENYCA What should I tell my health care provider before I take this medicine? They need to know if you have any of these conditions: -kidney disease -latex allergy -ongoing radiation therapy -sickle cell  disease -skin reactions to acrylic adhesives (On-Body Injector only) -an unusual or allergic reaction to pegfilgrastim, filgrastim, other medicines, foods, dyes, or preservatives -pregnant or trying to get pregnant -breast-feeding How should I use this medicine? This medicine is for injection under the skin. If you get this medicine at home, you will be taught how to prepare and give the pre-filled syringe or how to use the On-body Injector. Refer to the patient Instructions for Use for detailed instructions. Use exactly as directed. Tell your healthcare provider immediately if you suspect that the On-body Injector may not have performed as intended or if you suspect the use of the On-body Injector resulted in a missed or partial dose. It is important that you put your used needles and syringes in a special sharps container. Do not put them in a trash can. If you do not have a sharps container, call your pharmacist or healthcare provider to get one. Talk to your pediatrician regarding the use of this medicine in children. While this drug may be prescribed for selected conditions, precautions do apply. Overdosage: If you think you have taken too much of this medicine contact a poison control center or emergency room at once. NOTE: This medicine is only for you. Do not share this medicine with others. What if I miss a dose? It is important not to miss your dose. Call your doctor or health care professional if you miss your dose. If you miss a dose due to an On-body Injector failure or leakage, a new dose should be administered as soon as possible using a single prefilled syringe for manual use. What may interact with this medicine? Interactions have not  been studied. Give your health care provider a list of all the medicines, herbs, non-prescription drugs, or dietary supplements you use. Also tell them if you smoke, drink alcohol, or use illegal drugs. Some items may interact with your medicine. This  list may not describe all possible interactions. Give your health care provider a list of all the medicines, herbs, non-prescription drugs, or dietary supplements you use. Also tell them if you smoke, drink alcohol, or use illegal drugs. Some items may interact with your medicine. What should I watch for while using this medicine? You may need blood work done while you are taking this medicine. If you are going to need a MRI, CT scan, or other procedure, tell your doctor that you are using this medicine (On-Body Injector only). What side effects may I notice from receiving this medicine? Side effects that you should report to your doctor or health care professional as soon as possible: -allergic reactions like skin rash, itching or hives, swelling of the face, lips, or tongue -back pain -dizziness -fever -pain, redness, or irritation at site where injected -pinpoint red spots on the skin -red or dark-brown urine -shortness of breath or breathing problems -stomach or side pain, or pain at the shoulder -swelling -tiredness -trouble passing urine or change in the amount of urine Side effects that usually do not require medical attention (report to your doctor or health care professional if they continue or are bothersome): -bone pain -muscle pain This list may not describe all possible side effects. Call your doctor for medical advice about side effects. You may report side effects to FDA at 1-800-FDA-1088. Where should I keep my medicine? Keep out of the reach of children. If you are using this medicine at home, you will be instructed on how to store it. Throw away any unused medicine after the expiration date on the label. NOTE: This sheet is a summary. It may not cover all possible information. If you have questions about this medicine, talk to your doctor, pharmacist, or health care provider.  2019 Elsevier/Gold Standard (2018-03-09 16:57:08)  Coronavirus (COVID-19) Are you at  risk?  Are you at risk for the Coronavirus (COVID-19)?  To be considered HIGH RISK for Coronavirus (COVID-19), you have to meet the following criteria:  . Traveled to [place], [place], [place], [place] or [place]; or in the [place] to [place], [place], [place], or [place]; and have fever, cough, and shortness of breath within the last 2 weeks of travel OR . Been in close contact with a person diagnosed with COVID-19 within the last 2 weeks and have fever, cough, and shortness of breath . IF YOU DO NOT MEET THESE CRITERIA, YOU ARE CONSIDERED LOW RISK FOR COVID-19.  What to do if you are HIGH RISK for COVID-19?  Marland Kitchen If you are having a medical emergency, call 911. . Seek medical care right away. Before you go to a doctor's office, urgent care or emergency department, call ahead and tell them about your recent travel, contact with someone diagnosed with COVID-19, and your symptoms. You should receive instructions from your physician's office regarding next steps of care.  . When you arrive at healthcare provider, tell the healthcare staff immediately you have returned from visiting [place], [place], [place], [place] or [place]; or traveled in the [place] to [place], [place], [place], or [place]; in the last two weeks or you have been in close contact with a person diagnosed with COVID-19 in the last  2 weeks.   . Tell the health care staff about your symptoms: fever, cough and shortness of breath. . After you have been seen by a medical provider, you will be either: o Tested for (COVID-19) and discharged home on quarantine except to seek medical care if symptoms worsen, and asked to  - Stay home and avoid contact with others until you get your results (4-5 days)  - Avoid travel on public transportation if possible (such as bus, train, or airplane) or o Sent to the Emergency Department by EMS for evaluation, COVID-19 testing, and possible admission depending on your condition and  test results.  What to do if you are LOW RISK for COVID-19?  Reduce your risk of any infection by using the same precautions used for avoiding the common cold or flu:  Marland Kitchen Wash your hands often with soap and warm water for at least 20 seconds.  If soap and water are not readily available, use an alcohol-based hand sanitizer with at least 60% alcohol.  . If coughing or sneezing, cover your mouth and nose by coughing or sneezing into the elbow areas of your shirt or coat, into a tissue or into your sleeve (not your hands). . Avoid shaking hands with others and consider head nods or verbal greetings only. . Avoid touching your eyes, nose, or mouth with unwashed hands.  . Avoid close contact with people who are sick. . Avoid places or events with large numbers of people in one location, like concerts or sporting events. . Carefully consider travel plans you have or are making. . If you are planning any travel outside or inside the [place], visit the CDC's Travelers' Health webpage for the latest health notices. . If you have some symptoms but not all symptoms, continue to monitor at home and seek medical attention if your symptoms worsen. . If you are having a medical emergency, call 911.   [facility] / e-Visit: [email or web address]         [facility]: [facility]: [phone]                   [facility]: [phone]

## 2019-06-03 NOTE — Telephone Encounter (Signed)
Spoke with patient to congratulate her on her final chemo.  She states she is doing ok except for not getting any sleep last night due to the dexamethasone.  Informed her I would let Dr. Sondra Come know when her surgery is they can get her an appointment back with Dr. Sondra Come afterwards.  She verbalized understanding.

## 2019-06-04 ENCOUNTER — Other Ambulatory Visit: Payer: Self-pay

## 2019-06-04 ENCOUNTER — Telehealth: Payer: Self-pay

## 2019-06-04 ENCOUNTER — Telehealth: Payer: Self-pay | Admitting: Hematology

## 2019-06-04 MED ORDER — POTASSIUM CHLORIDE CRYS ER 20 MEQ PO TBCR: 20.0000 meq | EXTENDED_RELEASE_TABLET | Freq: Once | ORAL | 0 refills | Status: DC

## 2019-06-04 NOTE — Telephone Encounter (Signed)
-----   Message from Truitt Merle, MD sent at 06/04/2019  7:39 AM EDT ----- Please let pt know her K is low, and call in KCL 63meq daily for 2 weeks, thanks  Truitt Merle  06/04/2019

## 2019-06-04 NOTE — Telephone Encounter (Signed)
Scheduled appt per 6/18 los. Left a voice message of appt date and time.

## 2019-06-04 NOTE — Telephone Encounter (Signed)
Spoke with pt advised that per Dr. Glori Bickers is low advised pt that prescription was called in for KCL 9meq daily for 2 weeks. Pt verbalized understanding.

## 2019-06-10 ENCOUNTER — Encounter: Payer: Self-pay | Admitting: Hematology

## 2019-06-14 ENCOUNTER — Telehealth: Payer: Self-pay | Admitting: Hematology

## 2019-06-14 ENCOUNTER — Other Ambulatory Visit: Payer: Self-pay | Admitting: Hematology

## 2019-06-14 MED ORDER — CIPROFLOXACIN HCL 500 MG PO TABS: 500.0000 mg | ORAL_TABLET | Freq: Two times a day (BID) | ORAL | 0 refills | Status: DC

## 2019-06-14 NOTE — Telephone Encounter (Signed)
Patient developed dysuria and urinary frequency since yesterday, when she went to [place] to visit her son.  No fever, flank pain, or other symptoms.  I called in Cipro 500 mg twice daily for 5 days to her local pharmacy, and I instructed her to go to local emergency room if she spikes fever, back pain, or other new symptoms.  She voiced good understanding and appreciated the call.   Truitt Merle  06/14/2019

## 2019-06-21 ENCOUNTER — Telehealth: Payer: Self-pay | Admitting: Plastic Surgery

## 2019-06-21 NOTE — Telephone Encounter (Signed)

## 2019-06-22 ENCOUNTER — Other Ambulatory Visit: Payer: Self-pay

## 2019-06-22 ENCOUNTER — Ambulatory Visit (INDEPENDENT_AMBULATORY_CARE_PROVIDER_SITE_OTHER): Payer: BC Managed Care – PPO | Admitting: Surgical

## 2019-06-22 ENCOUNTER — Encounter: Payer: BLUE CROSS/BLUE SHIELD | Admitting: Plastic Surgery

## 2019-06-22 VITALS — BP 118/79 | HR 93 | Temp 97.9°F | Ht 63.0 in | Wt 174.0 lb

## 2019-06-22 DIAGNOSIS — C50411 Malignant neoplasm of upper-outer quadrant of right female breast: Secondary | ICD-10-CM

## 2019-06-22 DIAGNOSIS — M25561 Pain in right knee: Secondary | ICD-10-CM | POA: Insufficient documentation

## 2019-06-22 DIAGNOSIS — Z170 Estrogen receptor positive status [ER+]: Secondary | ICD-10-CM

## 2019-06-22 DIAGNOSIS — Z9013 Acquired absence of bilateral breasts and nipples: Secondary | ICD-10-CM

## 2019-06-22 MED ORDER — HYDROCODONE-ACETAMINOPHEN 5-325 MG PO TABS: 1.0000 | ORAL_TABLET | Freq: Four times a day (QID) | ORAL | 0 refills | Status: DC | PRN

## 2019-06-22 MED ORDER — ONDANSETRON HCL 4 MG PO TABS: 4.0000 mg | ORAL_TABLET | Freq: Three times a day (TID) | ORAL | 0 refills | Status: DC | PRN

## 2019-06-22 MED ORDER — CEPHALEXIN 500 MG PO CAPS: 500.0000 mg | ORAL_CAPSULE | Freq: Two times a day (BID) | ORAL | 0 refills | Status: DC

## 2019-06-22 NOTE — H&P (View-Only) (Signed)
Patient ID: [name], female    DOB: 07/26/1966, 53 y.o.   MRN: [id number]    ICD-10-CM   1. Acquired absence of bilateral breasts and nipples  Z90.13   2. Malignant neoplasm of upper-outer quadrant of right breast in female, estrogen receptor positive ([facility])  C50.411    Z17.0   3. Right knee pain, unspecified chronicity  M25.561      History of Present Illness: [name] is a 53 y.o.  female  with a history of breast cancer requiring bilateral mastectomy with placement of expanders.  We did a fill today, her total is 500/535 cc at this time. She is happy with her current breast size. She has been feeling well, no fevers, chills, n/v or colds recently. Her previous incisions are healing very well.  She does have some R knee swelling/pain due to overuse. She currently is using ibuprofen. We discussed the possibility of a knee injection, as she has had them in the past. Informed her this could delay healing, she opted to wait until after surgery as the knee is not that painful.    The patient has not had problems with anesthesia. Patient stated she will have radiation in the near future.  Past Medical History: Allergies: Allergies  Allergen Reactions  . Codeine Nausea And Vomiting  . Tramadol Nausea And Vomiting    Current Medications:  Current Outpatient Medications:  .  acetaminophen (TYLENOL) 500 MG tablet, Take 1,000 mg by mouth every 6 (six) hours as needed for moderate pain., Disp: , Rfl:  .  anastrozole (ARIMIDEX) 1 MG tablet, Take 1 tablet (1 mg total) by mouth daily., Disp: 30 tablet, Rfl: 2 .  cephALEXin (KEFLEX) 500 MG capsule, Take 1 capsule (500 mg total) by mouth 2 (two) times daily for 5 days., Disp: 10 capsule, Rfl: 0 .  Cholecalciferol (VITAMIN D3) 125 MCG (5000 UT) CAPS, Take 5,000 Units by mouth daily., Disp: , Rfl:  .  ciprofloxacin (CIPRO) 500 MG tablet, Take 1 tablet (500 mg total) by mouth 2 (two) times daily., Disp: 10 tablet, Rfl: 0 .   Cyanocobalamin (B-12) 2500 MCG TABS, Take 2,500 mcg by mouth daily., Disp: , Rfl:  .  cyclobenzaprine (FLEXERIL) 10 MG tablet, Take 1 tablet (10 mg total) by mouth 2 (two) times daily as needed for muscle spasms., Disp: 30 tablet, Rfl: 0 .  dexamethasone (DECADRON) 4 MG tablet, Take 2 tablets (8 mg total) by mouth 2 (two) times daily. Start the day before Taxotere. Then 1 tab twice daily the day after chemo for 3 days., Disp: 30 tablet, Rfl: 1 .  Diclofenac Sodium CR 100 MG 24 hr tablet, Take 1 tablet (100 mg total) by mouth daily., Disp: 5 tablet, Rfl: 0 .  docusate sodium (COLACE) 100 MG capsule, Take 100 mg by mouth daily., Disp: , Rfl:  .  HYDROcodone-acetaminophen (NORCO) 5-325 MG tablet, Take 1 tablet by mouth every 6 (six) hours as needed for up to 7 days for moderate pain., Disp: 28 tablet, Rfl: 0 .  ibuprofen (ADVIL,MOTRIN) 200 MG tablet, Take 600 mg by mouth every 6 (six) hours as needed for moderate pain. , Disp: , Rfl:  .  Iron-Vitamin C 65-125 MG TABS, Take 1 tablet by mouth daily., Disp: , Rfl:  .  LORazepam (ATIVAN) 0.5 MG tablet, Take 1 tablet (0.5 mg total) by mouth every 8 (eight) hours as needed for anxiety., Disp: 20 tablet, Rfl: 0 .  Multiple Vitamin (MULTIVITAMIN  WITH MINERALS) TABS tablet, Take 1 tablet by mouth daily., Disp: , Rfl:  .  omeprazole (PRILOSEC) 20 MG capsule, Take 20 mg by mouth daily., Disp: , Rfl:  .  ondansetron (ZOFRAN) 4 MG tablet, Take 1 tablet (4 mg total) by mouth every 8 (eight) hours as needed for nausea or vomiting., Disp: 20 tablet, Rfl: 0 .  ondansetron (ZOFRAN) 8 MG tablet, Take 1 tablet (8 mg total) by mouth 2 (two) times daily as needed for refractory nausea / vomiting. Start on day 3 after chemo., Disp: 30 tablet, Rfl: 1 .  potassium chloride SA (K-DUR) 20 MEQ tablet, Take 1 tablet (20 mEq total) by mouth once for 1 dose., Disp: 14 tablet, Rfl: 0 .  prochlorperazine (COMPAZINE) 10 MG tablet, Take 1 tablet (10 mg total) by mouth every 6 (six) hours  as needed (Nausea or vomiting)., Disp: 30 tablet, Rfl: 1 .  venlafaxine XR (EFFEXOR-XR) 75 MG 24 hr capsule, TAKE 1 CAPSULE (75 MG TOTAL) BY MOUTH DAILY WITH BREAKFAST., Disp: 90 capsule, Rfl: 1 .  zolpidem (AMBIEN) 5 MG tablet, Take 1 tablet (5 mg total) by mouth at bedtime as needed for sleep., Disp: 30 tablet, Rfl: 1  Past Medical Problems: Past Medical History:  Diagnosis Date  . Anemia   . Arthritis   . Cancer [facility])    right breast  . Family history of adverse reaction to anesthesia    sister has same issues  . GERD (gastroesophageal reflux disease)   . PONV (postoperative nausea and vomiting)     Past Surgical History: Past Surgical History:  Procedure Laterality Date  . BREAST RECONSTRUCTION WITH PLACEMENT OF TISSUE EXPANDER AND FLEX HD (ACELLULAR HYDRATED DERMIS) Bilateral 02/24/2019   Procedure: BREAST RECONSTRUCTION WITH PLACEMENT OF TISSUE EXPANDER AND FLEX HD (ACELLULAR HYDRATED DERMIS);  Surgeon: Wallace Going, DO;  Location: [facility];  Service: Plastics;  Laterality: Bilateral;  . BREAST SURGERY     left breast bx  . EYE SURGERY     dart to the eye  (right)  . FACIAL FRACTURE SURGERY    . FRACTURE SURGERY    . KNEE SURGERY    . MASTECTOMY W/ SENTINEL NODE BIOPSY Bilateral 02/24/2019   Procedure: RIGHT MASTECTOMY WITH SENTINEL LYMPH NODE MAPPING AND LEFT PROPHYLACTIC MASTECTOMY;  Surgeon: Jovita Kussmaul, MD;  Location: [facility];  Service: General;  Laterality: Bilateral;  . SHOULDER SURGERY      Social History: Social History   Socioeconomic History  . Marital status: Married    Spouse name: Not on file  . Number of children: Not on file  . Years of education: Not on file  . Highest education level: Not on file  Occupational History  . Not on file  Social Needs  . Financial resource strain: Not on file  . Food insecurity    Worry: Not on file    Inability: Not on file  . Transportation needs    Medical: Not on file    Non-medical: Not on file  Tobacco  Use  . Smoking status: Never Smoker  . Smokeless tobacco: Never Used  Substance and Sexual Activity  . Alcohol use: Yes    Comment: occasional  . Drug use: No  . Sexual activity: Not on file  Lifestyle  . Physical activity    Days per week: Not on file    Minutes per session: Not on file  . Stress: Not on file  Relationships  . Social connections  Talks on phone: Not on file    Gets together: Not on file    Attends religious service: Not on file    Active member of club or organization: Not on file    Attends meetings of clubs or organizations: Not on file    Relationship status: Not on file  . Intimate partner violence    Fear of current or ex partner: Not on file    Emotionally abused: Not on file    Physically abused: Not on file    Forced sexual activity: Not on file  Other Topics Concern  . Not on file  Social History Narrative  . Not on file    Family History: Family History  Problem Relation Age of Onset  . Brain cancer Father   . Cancer Maternal Uncle        melanoma   . Cancer Paternal Uncle        gastric cancer  . Breast cancer Neg Hx     Review of Systems: Review of Systems  Constitutional: Negative for chills, diaphoresis and fever.  HENT: Negative.   Eyes: Negative.   Respiratory: Negative.  Negative for cough, shortness of breath and wheezing.   Cardiovascular: Negative.  Negative for chest pain and palpitations.  Genitourinary: Negative.   Musculoskeletal: Positive for joint pain (R knee).  Skin: Negative.  Negative for itching and rash.  Neurological: Negative for dizziness, weakness and headaches.    Physical Exam: Vital Signs BP 118/79 (BP Location: Left Arm, Patient Position: Sitting)   Pulse 93   Temp 97.9 F (36.6 C)   Ht 5\' 3"  (1.6 m)   Wt 174 lb (78.9 kg)   SpO2 97%   BMI 30.82 kg/m   Physical Exam Constitutional:      General: She is not in acute distress.    Appearance: Normal appearance. She is not ill-appearing or  diaphoretic.  HENT:     Head: Normocephalic and atraumatic.  Eyes:     Extraocular Movements: Extraocular movements intact.     Pupils: Pupils are equal, round, and reactive to light.  Neck:     Musculoskeletal: Normal range of motion and neck supple.  Cardiovascular:     Rate and Rhythm: Normal rate and regular rhythm.     Pulses: Normal pulses.     Heart sounds: Normal heart sounds.  Pulmonary:     Effort: Pulmonary effort is normal. No respiratory distress.     Breath sounds: Normal breath sounds. No wheezing.  Abdominal:     General: Abdomen is flat. There is no distension.     Palpations: Abdomen is soft.     Tenderness: There is no abdominal tenderness. There is no guarding.  Musculoskeletal: Normal range of motion.        General: Swelling (R knee.) present.  Lymphadenopathy:     Cervical: No cervical adenopathy.  Skin:    General: Skin is warm and dry.  Neurological:     General: No focal deficit present.     Mental Status: She is alert and oriented to person, place, and time.     Motor: No weakness.  Psychiatric:        Mood and Affect: Mood normal.        Behavior: Behavior normal.    Assessment/Plan: Mrs. Rua is scheduled for removal of bilateral breast tissue expanders with placement of implants with Dr. Marla Roe on 06/30/2019. She would like her post-op breast size to be a C. She is happy  with the current size of her expanders.   For her right knee pain, she has been using occasional tylenol/ibuprofen. At this time she decided to wait until after surgery to visit ortho for injections, due to concern for delayed wound healing with steroids. She can use Voltaren cream OTC if necessary with tylenol for pain PRN.  We placed injectable saline in the Expander using a sterile technique: Right: 50 cc for a total of 500 / 535 cc Left: 50 cc for a total of 500 / 535 cc  The risks, benefits, and alternatives of procedure discussed, questions answered and consent  obtained.    The consent was obtained with risks and complications reviewed which included bleeding, pain, scar, infection and the risk of anesthesia.  The patients questions were answered to the patients expressed satisfaction. The risk that can be encountered with breast augmentation were discussed and include the following but not limited to these:  Breast asymmetry, fluid accumulation, firmness of the breast, skin loss, fat necrosis of the breast tissue, bleeding, infection, healing delay.  Deep vein thrombosis, cardiac and pulmonary complications are risks to any procedure. The implant can have a faulty position or one different from what you had desired.  The implant can have rippling, wrinkling, leakage or rupture. There are risks of anesthesia, changes to skin sensation and injury to nerves or blood vessels.  The muscle can be temporarily or permanently injured.  You may have an allergic reaction to tape, suture, glue, blood products which can result in skin discoloration, swelling, pain, skin lesions, poor healing.  Any of these can lead to the need for revisonal surgery or stage procedures.  A reduction has potential to interfere with diagnostic procedures.  Nipple or breast piercing can increase risks of infection.    This procedure is best done when the breast is fully developed.  Changes in the breast will continue to occur over time.  Pregnancy can alter the outcomes of previous breast reduction surgery, weight gain and weigh loss can also effect the long term appearance. Implants are not guaranteed to last a lifetime.  Future surgery may be required.  Regular examinations of the breast are required to evaluate the condition of your breasts and implants.   Electronically signed by: Carola Rhine Courage Biglow, PA-C 06/22/2019 9:01 AM

## 2019-06-22 NOTE — Progress Notes (Addendum)
Patient ID: [name], female    DOB: [birth year], 53 y.o.   MRN: [id number]    ICD-10-CM   1. Acquired absence of bilateral breasts and nipples  Z90.13   2. Malignant neoplasm of upper-outer quadrant of right breast in female, estrogen receptor positive ([place])  C50.411    Z17.0   3. Right knee pain, unspecified chronicity  M25.561      History of Present Illness: [name] is a 53 y.o.  female  with a history of breast cancer requiring bilateral mastectomy with placement of expanders.  We did a fill today, her total is 500/535 cc at this time. She is happy with her current breast size. She has been feeling well, no fevers, chills, n/v or colds recently. Her previous incisions are healing very well.  She does have some R knee swelling/pain due to overuse. She currently is using ibuprofen. We discussed the possibility of a knee injection, as she has had them in the past. Informed her this could delay healing, she opted to wait until after surgery as the knee is not that painful.    The patient has not had problems with anesthesia. Patient stated she will have radiation in the near future.  Past Medical History: Allergies: Allergies  Allergen Reactions  . Codeine Nausea And Vomiting  . Tramadol Nausea And Vomiting    Current Medications:  Current Outpatient Medications:  .  acetaminophen (TYLENOL) 500 MG tablet, Take 1,000 mg by mouth every 6 (six) hours as needed for moderate pain., Disp: , Rfl:  .  anastrozole (ARIMIDEX) 1 MG tablet, Take 1 tablet (1 mg total) by mouth daily., Disp: 30 tablet, Rfl: 2 .  cephALEXin (KEFLEX) 500 MG capsule, Take 1 capsule (500 mg total) by mouth 2 (two) times daily for 5 days., Disp: 10 capsule, Rfl: 0 .  Cholecalciferol (VITAMIN D3) 125 MCG (5000 UT) CAPS, Take 5,000 Units by mouth daily., Disp: , Rfl:  .  ciprofloxacin (CIPRO) 500 MG tablet, Take 1 tablet (500 mg total) by mouth 2 (two) times daily., Disp: 10 tablet, Rfl: 0 .   Cyanocobalamin (B-12) 2500 MCG TABS, Take 2,500 mcg by mouth daily., Disp: , Rfl:  .  cyclobenzaprine (FLEXERIL) 10 MG tablet, Take 1 tablet (10 mg total) by mouth 2 (two) times daily as needed for muscle spasms., Disp: 30 tablet, Rfl: 0 .  dexamethasone (DECADRON) 4 MG tablet, Take 2 tablets (8 mg total) by mouth 2 (two) times daily. Start the day before Taxotere. Then 1 tab twice daily the day after chemo for 3 days., Disp: 30 tablet, Rfl: 1 .  Diclofenac Sodium CR 100 MG 24 hr tablet, Take 1 tablet (100 mg total) by mouth daily., Disp: 5 tablet, Rfl: 0 .  docusate sodium (COLACE) 100 MG capsule, Take 100 mg by mouth daily., Disp: , Rfl:  .  HYDROcodone-acetaminophen (NORCO) 5-325 MG tablet, Take 1 tablet by mouth every 6 (six) hours as needed for up to 7 days for moderate pain., Disp: 28 tablet, Rfl: 0 .  ibuprofen (ADVIL,MOTRIN) 200 MG tablet, Take 600 mg by mouth every 6 (six) hours as needed for moderate pain. , Disp: , Rfl:  .  Iron-Vitamin C 65-125 MG TABS, Take 1 tablet by mouth daily., Disp: , Rfl:  .  LORazepam (ATIVAN) 0.5 MG tablet, Take 1 tablet (0.5 mg total) by mouth every 8 (eight) hours as needed for anxiety., Disp: 20 tablet, Rfl: 0 .  Multiple Vitamin (MULTIVITAMIN  WITH MINERALS) TABS tablet, Take 1 tablet by mouth daily., Disp: , Rfl:  .  omeprazole (PRILOSEC) 20 MG capsule, Take 20 mg by mouth daily., Disp: , Rfl:  .  ondansetron (ZOFRAN) 4 MG tablet, Take 1 tablet (4 mg total) by mouth every 8 (eight) hours as needed for nausea or vomiting., Disp: 20 tablet, Rfl: 0 .  ondansetron (ZOFRAN) 8 MG tablet, Take 1 tablet (8 mg total) by mouth 2 (two) times daily as needed for refractory nausea / vomiting. Start on day 3 after chemo., Disp: 30 tablet, Rfl: 1 .  potassium chloride SA (K-DUR) 20 MEQ tablet, Take 1 tablet (20 mEq total) by mouth once for 1 dose., Disp: 14 tablet, Rfl: 0 .  prochlorperazine (COMPAZINE) 10 MG tablet, Take 1 tablet (10 mg total) by mouth every 6 (six) hours  as needed (Nausea or vomiting)., Disp: 30 tablet, Rfl: 1 .  venlafaxine XR (EFFEXOR-XR) 75 MG 24 hr capsule, TAKE 1 CAPSULE (75 MG TOTAL) BY MOUTH DAILY WITH BREAKFAST., Disp: 90 capsule, Rfl: 1 .  zolpidem (AMBIEN) 5 MG tablet, Take 1 tablet (5 mg total) by mouth at bedtime as needed for sleep., Disp: 30 tablet, Rfl: 1  Past Medical Problems: Past Medical History:  Diagnosis Date  . Anemia   . Arthritis   . Cancer [facility])    right breast  . Family history of adverse reaction to anesthesia    sister has same issues  . GERD (gastroesophageal reflux disease)   . PONV (postoperative nausea and vomiting)     Past Surgical History: Past Surgical History:  Procedure Laterality Date  . BREAST RECONSTRUCTION WITH PLACEMENT OF TISSUE EXPANDER AND FLEX HD (ACELLULAR HYDRATED DERMIS) Bilateral 02/24/2019   Procedure: BREAST RECONSTRUCTION WITH PLACEMENT OF TISSUE EXPANDER AND FLEX HD (ACELLULAR HYDRATED DERMIS);  Surgeon: Wallace Going, DO;  Location: [place];  Service: Plastics;  Laterality: Bilateral;  . BREAST SURGERY     left breast bx  . EYE SURGERY     dart to the eye  (right)  . FACIAL FRACTURE SURGERY    . FRACTURE SURGERY    . KNEE SURGERY    . MASTECTOMY W/ SENTINEL NODE BIOPSY Bilateral 02/24/2019   Procedure: RIGHT MASTECTOMY WITH SENTINEL LYMPH NODE MAPPING AND LEFT PROPHYLACTIC MASTECTOMY;  Surgeon: Jovita Kussmaul, MD;  Location: [place];  Service: General;  Laterality: Bilateral;  . SHOULDER SURGERY      Social History: Social History   Socioeconomic History  . Marital status: Married    Spouse name: Not on file  . Number of children: Not on file  . Years of education: Not on file  . Highest education level: Not on file  Occupational History  . Not on file  Social Needs  . Financial resource strain: Not on file  . Food insecurity    Worry: Not on file    Inability: Not on file  . Transportation needs    Medical: Not on file    Non-medical: Not on file  Tobacco  Use  . Smoking status: Never Smoker  . Smokeless tobacco: Never Used  Substance and Sexual Activity  . Alcohol use: Yes    Comment: occasional  . Drug use: No  . Sexual activity: Not on file  Lifestyle  . Physical activity    Days per week: Not on file    Minutes per session: Not on file  . Stress: Not on file  Relationships  . Social connections  Talks on phone: Not on file    Gets together: Not on file    Attends religious service: Not on file    Active member of club or organization: Not on file    Attends meetings of clubs or organizations: Not on file    Relationship status: Not on file  . Intimate partner violence    Fear of current or ex partner: Not on file    Emotionally abused: Not on file    Physically abused: Not on file    Forced sexual activity: Not on file  Other Topics Concern  . Not on file  Social History Narrative  . Not on file    Family History: Family History  Problem Relation Age of Onset  . Brain cancer Father   . Cancer Maternal Uncle        melanoma   . Cancer Paternal Uncle        gastric cancer  . Breast cancer Neg Hx     Review of Systems: Review of Systems  Constitutional: Negative for chills, diaphoresis and fever.  HENT: Negative.   Eyes: Negative.   Respiratory: Negative.  Negative for cough, shortness of breath and wheezing.   Cardiovascular: Negative.  Negative for chest pain and palpitations.  Genitourinary: Negative.   Musculoskeletal: Positive for joint pain (R knee).  Skin: Negative.  Negative for itching and rash.  Neurological: Negative for dizziness, weakness and headaches.    Physical Exam: Vital Signs BP 118/79 (BP Location: Left Arm, Patient Position: Sitting)   Pulse 93   Temp 97.9 F (36.6 C)   Ht 5\' 3"  (1.6 m)   Wt 174 lb (78.9 kg)   SpO2 97%   BMI 30.82 kg/m   Physical Exam Constitutional:      General: She is not in acute distress.    Appearance: Normal appearance. She is not ill-appearing or  diaphoretic.  HENT:     Head: Normocephalic and atraumatic.  Eyes:     Extraocular Movements: Extraocular movements intact.     Pupils: Pupils are equal, round, and reactive to light.  Neck:     Musculoskeletal: Normal range of motion and neck supple.  Cardiovascular:     Rate and Rhythm: Normal rate and regular rhythm.     Pulses: Normal pulses.     Heart sounds: Normal heart sounds.  Pulmonary:     Effort: Pulmonary effort is normal. No respiratory distress.     Breath sounds: Normal breath sounds. No wheezing.  Abdominal:     General: Abdomen is flat. There is no distension.     Palpations: Abdomen is soft.     Tenderness: There is no abdominal tenderness. There is no guarding.  Musculoskeletal: Normal range of motion.        General: Swelling (R knee.) present.  Lymphadenopathy:     Cervical: No cervical adenopathy.  Skin:    General: Skin is warm and dry.  Neurological:     General: No focal deficit present.     Mental Status: She is alert and oriented to person, place, and time.     Motor: No weakness.  Psychiatric:        Mood and Affect: Mood normal.        Behavior: Behavior normal.    Assessment/Plan: [name] is scheduled for removal of bilateral breast tissue expanders with placement of implants with Dr. Marla Roe on 06/30/2019. She would like her post-op breast size to be a C. She is happy  with the current size of her expanders.   For her right knee pain, she has been using occasional tylenol/ibuprofen. At this time she decided to wait until after surgery to visit ortho for injections, due to concern for delayed wound healing with steroids. She can use Voltaren cream OTC if necessary with tylenol for pain PRN.  We placed injectable saline in the Expander using a sterile technique: Right: 50 cc for a total of 500 / 535 cc Left: 50 cc for a total of 500 / 535 cc  The risks, benefits, and alternatives of procedure discussed, questions answered and consent  obtained.    The consent was obtained with risks and complications reviewed which included bleeding, pain, scar, infection and the risk of anesthesia.  The patients questions were answered to the patients expressed satisfaction. The risk that can be encountered with breast augmentation were discussed and include the following but not limited to these:  Breast asymmetry, fluid accumulation, firmness of the breast, skin loss, fat necrosis of the breast tissue, bleeding, infection, healing delay.  Deep vein thrombosis, cardiac and pulmonary complications are risks to any procedure. The implant can have a faulty position or one different from what you had desired.  The implant can have rippling, wrinkling, leakage or rupture. There are risks of anesthesia, changes to skin sensation and injury to nerves or blood vessels.  The muscle can be temporarily or permanently injured.  You may have an allergic reaction to tape, suture, glue, blood products which can result in skin discoloration, swelling, pain, skin lesions, poor healing.  Any of these can lead to the need for revisonal surgery or stage procedures.  A reduction has potential to interfere with diagnostic procedures.  Nipple or breast piercing can increase risks of infection.    This procedure is best done when the breast is fully developed.  Changes in the breast will continue to occur over time.  Pregnancy can alter the outcomes of previous breast reduction surgery, weight gain and weigh loss can also effect the long term appearance. Implants are not guaranteed to last a lifetime.  Future surgery may be required.  Regular examinations of the breast are required to evaluate the condition of your breasts and implants.   Electronically signed by: Carola Rhine Alontae Chaloux, PA-C 06/22/2019 9:01 AM

## 2019-06-23 ENCOUNTER — Encounter (HOSPITAL_BASED_OUTPATIENT_CLINIC_OR_DEPARTMENT_OTHER): Payer: Self-pay | Admitting: *Deleted

## 2019-06-23 ENCOUNTER — Other Ambulatory Visit: Payer: Self-pay

## 2019-06-26 ENCOUNTER — Other Ambulatory Visit (HOSPITAL_COMMUNITY)
Admission: RE | Admit: 2019-06-26 | Discharge: 2019-06-26 | Disposition: A | Discharge status: Home / Self Care | Payer: BC Managed Care – PPO | Source: Ambulatory Visit | Attending: Plastic Surgery | Admitting: Plastic Surgery

## 2019-06-26 DIAGNOSIS — Z1159 Encounter for screening for other viral diseases: Secondary | ICD-10-CM | POA: Insufficient documentation

## 2019-06-26 DIAGNOSIS — Z01812 Encounter for preprocedural laboratory examination: Secondary | ICD-10-CM | POA: Insufficient documentation

## 2019-06-26 LAB — SARS CORONAVIRUS 2 (TAT 6-24 HRS): SARS Coronavirus 2: NEGATIVE

## 2019-06-30 ENCOUNTER — Ambulatory Visit (HOSPITAL_BASED_OUTPATIENT_CLINIC_OR_DEPARTMENT_OTHER)
Admission: RE | Admit: 2019-06-30 | Discharge: 2019-06-30 | Disposition: A | Discharge status: Home / Self Care | Payer: BC Managed Care – PPO | Attending: Plastic Surgery | Admitting: Plastic Surgery

## 2019-06-30 ENCOUNTER — Encounter (HOSPITAL_BASED_OUTPATIENT_CLINIC_OR_DEPARTMENT_OTHER)
Admission: RE | Disposition: A | Discharge status: Home / Self Care | Payer: Self-pay | Source: Home / Self Care | Attending: Plastic Surgery

## 2019-06-30 ENCOUNTER — Ambulatory Visit (HOSPITAL_BASED_OUTPATIENT_CLINIC_OR_DEPARTMENT_OTHER): Payer: BC Managed Care – PPO | Admitting: Anesthesiology

## 2019-06-30 ENCOUNTER — Encounter (HOSPITAL_BASED_OUTPATIENT_CLINIC_OR_DEPARTMENT_OTHER): Payer: Self-pay | Admitting: Plastic Surgery

## 2019-06-30 DIAGNOSIS — Z9013 Acquired absence of bilateral breasts and nipples: Secondary | ICD-10-CM

## 2019-06-30 DIAGNOSIS — Z853 Personal history of malignant neoplasm of breast: Secondary | ICD-10-CM | POA: Diagnosis not present

## 2019-06-30 DIAGNOSIS — Z170 Estrogen receptor positive status [ER+]: Secondary | ICD-10-CM | POA: Insufficient documentation

## 2019-06-30 DIAGNOSIS — C50411 Malignant neoplasm of upper-outer quadrant of right female breast: Secondary | ICD-10-CM | POA: Insufficient documentation

## 2019-06-30 DIAGNOSIS — K219 Gastro-esophageal reflux disease without esophagitis: Secondary | ICD-10-CM | POA: Insufficient documentation

## 2019-06-30 DIAGNOSIS — Z421 Encounter for breast reconstruction following mastectomy: Secondary | ICD-10-CM | POA: Diagnosis present

## 2019-06-30 DIAGNOSIS — M1990 Unspecified osteoarthritis, unspecified site: Secondary | ICD-10-CM | POA: Insufficient documentation

## 2019-06-30 DIAGNOSIS — Z79899 Other long term (current) drug therapy: Secondary | ICD-10-CM | POA: Insufficient documentation

## 2019-06-30 HISTORY — DX: Anxiety disorder, unspecified: F41.9

## 2019-06-30 HISTORY — PX: REMOVAL OF BILATERAL TISSUE EXPANDERS WITH PLACEMENT OF BILATERAL BREAST IMPLANTS: SHX6431

## 2019-06-30 SURGERY — REMOVAL, TISSUE EXPANDER, BREAST, BILATERAL, WITH BILATERAL IMPLANT IMPLANT INSERTION
Anesthesia: General | Site: Breast | Laterality: Bilateral

## 2019-06-30 MED ORDER — EPINEPHRINE PF 1 MG/ML IJ SOLN
INTRAMUSCULAR | Status: AC
  Filled 2019-06-30: qty 1

## 2019-06-30 MED ORDER — ONDANSETRON HCL 4 MG/2ML IJ SOLN
INTRAMUSCULAR | Status: AC
  Filled 2019-06-30: qty 2

## 2019-06-30 MED ORDER — BUPIVACAINE-EPINEPHRINE (PF) 0.25% -1:200000 IJ SOLN
INTRAMUSCULAR | Status: AC
  Filled 2019-06-30: qty 30

## 2019-06-30 MED ORDER — FENTANYL CITRATE (PF) 100 MCG/2ML IJ SOLN
50.0000 ug | INTRAMUSCULAR | Status: DC | PRN
  Administered 2019-06-30: 08:00:00 100 ug via INTRAVENOUS

## 2019-06-30 MED ORDER — SUGAMMADEX SODIUM 500 MG/5ML IV SOLN
INTRAVENOUS | Status: AC
  Filled 2019-06-30: qty 5

## 2019-06-30 MED ORDER — PROPOFOL 10 MG/ML IV BOLUS
INTRAVENOUS | Status: DC | PRN
  Administered 2019-06-30: 200 mg via INTRAVENOUS

## 2019-06-30 MED ORDER — ONDANSETRON HCL 4 MG/2ML IJ SOLN
INTRAMUSCULAR | Status: DC | PRN
  Administered 2019-06-30: 4 mg via INTRAVENOUS

## 2019-06-30 MED ORDER — SODIUM CHLORIDE 0.9 % IV SOLN
INTRAVENOUS | Status: AC
  Filled 2019-06-30: qty 500000

## 2019-06-30 MED ORDER — LIDOCAINE HCL (PF) 1 % IJ SOLN
INTRAMUSCULAR | Status: AC
  Filled 2019-06-30: qty 30

## 2019-06-30 MED ORDER — EPHEDRINE 5 MG/ML INJ
INTRAVENOUS | Status: AC
  Filled 2019-06-30: qty 10

## 2019-06-30 MED ORDER — DEXAMETHASONE SODIUM PHOSPHATE 4 MG/ML IJ SOLN
INTRAMUSCULAR | Status: DC | PRN
  Administered 2019-06-30: 10 mg via INTRAVENOUS

## 2019-06-30 MED ORDER — SCOPOLAMINE 1 MG/3DAYS TD PT72
MEDICATED_PATCH | TRANSDERMAL | Status: AC
  Filled 2019-06-30: qty 1

## 2019-06-30 MED ORDER — OXYCODONE HCL 5 MG/5ML PO SOLN: 5.0000 mg | Freq: Once | ORAL | Status: DC | PRN

## 2019-06-30 MED ORDER — SCOPOLAMINE 1 MG/3DAYS TD PT72
1.0000 | MEDICATED_PATCH | Freq: Once | TRANSDERMAL | Status: DC
  Administered 2019-06-30: 1.5 mg via TRANSDERMAL

## 2019-06-30 MED ORDER — MIDAZOLAM HCL 2 MG/2ML IJ SOLN
1.0000 mg | INTRAMUSCULAR | Status: DC | PRN
  Administered 2019-06-30: 2 mg via INTRAVENOUS

## 2019-06-30 MED ORDER — PHENYLEPHRINE 40 MCG/ML (10ML) SYRINGE FOR IV PUSH (FOR BLOOD PRESSURE SUPPORT)
PREFILLED_SYRINGE | INTRAVENOUS | Status: AC
  Filled 2019-06-30: qty 10

## 2019-06-30 MED ORDER — FENTANYL CITRATE (PF) 100 MCG/2ML IJ SOLN
INTRAMUSCULAR | Status: AC
  Filled 2019-06-30: qty 2

## 2019-06-30 MED ORDER — MIDAZOLAM HCL 2 MG/2ML IJ SOLN
INTRAMUSCULAR | Status: AC
  Filled 2019-06-30: qty 2

## 2019-06-30 MED ORDER — OXYCODONE HCL 5 MG PO TABS: 5.0000 mg | ORAL_TABLET | Freq: Once | ORAL | Status: DC | PRN

## 2019-06-30 MED ORDER — LIDOCAINE HCL (CARDIAC) PF 100 MG/5ML IV SOSY
PREFILLED_SYRINGE | INTRAVENOUS | Status: DC | PRN
  Administered 2019-06-30: 80 mg via INTRAVENOUS

## 2019-06-30 MED ORDER — ROCURONIUM BROMIDE 100 MG/10ML IV SOLN
INTRAVENOUS | Status: DC | PRN
  Administered 2019-06-30: 50 mg via INTRAVENOUS

## 2019-06-30 MED ORDER — SODIUM CHLORIDE 0.9 % IV SOLN
INTRAVENOUS | Status: DC | PRN
  Administered 2019-06-30: 500 mL

## 2019-06-30 MED ORDER — EPHEDRINE SULFATE 50 MG/ML IJ SOLN
INTRAMUSCULAR | Status: DC | PRN
  Administered 2019-06-30: 10 mg via INTRAVENOUS

## 2019-06-30 MED ORDER — SUGAMMADEX SODIUM 200 MG/2ML IV SOLN
INTRAVENOUS | Status: DC | PRN
  Administered 2019-06-30: 200 mg via INTRAVENOUS

## 2019-06-30 MED ORDER — PHENYLEPHRINE HCL (PRESSORS) 10 MG/ML IV SOLN
INTRAVENOUS | Status: DC | PRN
  Administered 2019-06-30 (×3): 80 ug via INTRAVENOUS

## 2019-06-30 MED ORDER — CHLORHEXIDINE GLUCONATE CLOTH 2 % EX PADS: 6.0000 | MEDICATED_PAD | Freq: Once | CUTANEOUS | Status: DC

## 2019-06-30 MED ORDER — DEXAMETHASONE SODIUM PHOSPHATE 10 MG/ML IJ SOLN
INTRAMUSCULAR | Status: AC
  Filled 2019-06-30: qty 1

## 2019-06-30 MED ORDER — LIDOCAINE-EPINEPHRINE 1 %-1:100000 IJ SOLN
INTRAMUSCULAR | Status: AC
  Filled 2019-06-30: qty 1

## 2019-06-30 MED ORDER — LIDOCAINE 2% (20 MG/ML) 5 ML SYRINGE
INTRAMUSCULAR | Status: AC
  Filled 2019-06-30: qty 5

## 2019-06-30 MED ORDER — CEFAZOLIN SODIUM-DEXTROSE 2-4 GM/100ML-% IV SOLN
2.0000 g | INTRAVENOUS | Status: AC
  Administered 2019-06-30: 2 g via INTRAVENOUS

## 2019-06-30 MED ORDER — SUCCINYLCHOLINE CHLORIDE 200 MG/10ML IV SOSY
PREFILLED_SYRINGE | INTRAVENOUS | Status: AC
  Filled 2019-06-30: qty 10

## 2019-06-30 MED ORDER — LACTATED RINGERS IV SOLN
INTRAVENOUS | Status: DC
  Administered 2019-06-30 (×2): via INTRAVENOUS

## 2019-06-30 MED ORDER — ONDANSETRON HCL 4 MG/2ML IJ SOLN: 4.0000 mg | Freq: Once | INTRAMUSCULAR | Status: DC | PRN

## 2019-06-30 MED ORDER — ROCURONIUM BROMIDE 10 MG/ML (PF) SYRINGE
PREFILLED_SYRINGE | INTRAVENOUS | Status: AC
  Filled 2019-06-30: qty 10

## 2019-06-30 MED ORDER — CEFAZOLIN SODIUM-DEXTROSE 2-4 GM/100ML-% IV SOLN
INTRAVENOUS | Status: AC
  Filled 2019-06-30: qty 100

## 2019-06-30 MED ORDER — BUPIVACAINE-EPINEPHRINE 0.25% -1:200000 IJ SOLN
INTRAMUSCULAR | Status: DC | PRN
  Administered 2019-06-30: 9 mL

## 2019-06-30 MED ORDER — FENTANYL CITRATE (PF) 100 MCG/2ML IJ SOLN: 25.0000 ug | INTRAMUSCULAR | Status: DC | PRN

## 2019-06-30 SURGICAL SUPPLY — 82 items
ADH SKN CLS APL DERMABOND .7 (GAUZE/BANDAGES/DRESSINGS) ×2
APL PRP STRL LF DISP 70% ISPRP (MISCELLANEOUS) ×2
BAG DECANTER FOR FLEXI CONT (MISCELLANEOUS) ×2 IMPLANT
BINDER ABDOMINAL  9 SM 30-45 (SOFTGOODS)
BINDER ABDOMINAL 10 UNV 27-48 (MISCELLANEOUS) IMPLANT
BINDER ABDOMINAL 12 SM 30-45 (SOFTGOODS) IMPLANT
BINDER ABDOMINAL 9 SM 30-45 (SOFTGOODS) IMPLANT
BINDER BREAST LRG (GAUZE/BANDAGES/DRESSINGS) IMPLANT
BINDER BREAST MEDIUM (GAUZE/BANDAGES/DRESSINGS) IMPLANT
BINDER BREAST XLRG (GAUZE/BANDAGES/DRESSINGS) IMPLANT
BINDER BREAST XXLRG (GAUZE/BANDAGES/DRESSINGS) IMPLANT
BIOPATCH RED 1 DISK 7.0 (GAUZE/BANDAGES/DRESSINGS) IMPLANT
BLADE HEX COATED 2.75 (ELECTRODE) ×2 IMPLANT
BLADE SURG 15 STRL LF DISP TIS (BLADE) ×2 IMPLANT
BLADE SURG 15 STRL SS (BLADE) ×4
BNDG GAUZE ELAST 4 BULKY (GAUZE/BANDAGES/DRESSINGS) ×2 IMPLANT
CANISTER SUCT 1200ML W/VALVE (MISCELLANEOUS) ×2 IMPLANT
CHLORAPREP W/TINT 26 (MISCELLANEOUS) ×3 IMPLANT
CORD BIPOLAR FORCEPS 12FT (ELECTRODE) IMPLANT
COVER BACK TABLE REUSABLE LG (DRAPES) ×2 IMPLANT
COVER MAYO STAND REUSABLE (DRAPES) ×2 IMPLANT
COVER WAND RF STERILE (DRAPES) IMPLANT
DECANTER SPIKE VIAL GLASS SM (MISCELLANEOUS) IMPLANT
DERMABOND ADVANCED (GAUZE/BANDAGES/DRESSINGS) ×2
DERMABOND ADVANCED .7 DNX12 (GAUZE/BANDAGES/DRESSINGS) ×1 IMPLANT
DRAIN CHANNEL 19F RND (DRAIN) IMPLANT
DRAPE LAPAROSCOPIC ABDOMINAL (DRAPES) ×2 IMPLANT
DRSG PAD ABDOMINAL 8X10 ST (GAUZE/BANDAGES/DRESSINGS) ×6 IMPLANT
ELECT BLADE 4.0 EZ CLEAN MEGAD (MISCELLANEOUS) ×2
ELECT REM PT RETURN 9FT ADLT (ELECTROSURGICAL) ×2
ELECTRODE BLDE 4.0 EZ CLN MEGD (MISCELLANEOUS) ×1 IMPLANT
ELECTRODE REM PT RTRN 9FT ADLT (ELECTROSURGICAL) ×1 IMPLANT
EVACUATOR SILICONE 100CC (DRAIN) IMPLANT
EXTRACTOR CANIST REVOLVE STRL (CANNISTER) ×2 IMPLANT
GAUZE SPONGE 4X4 12PLY STRL LF (GAUZE/BANDAGES/DRESSINGS) IMPLANT
GLOVE BIO SURGEON STRL SZ 6.5 (GLOVE) ×6 IMPLANT
GLOVE BIO SURGEON STRL SZ7 (GLOVE) ×1 IMPLANT
GOWN STRL REUS W/ TWL LRG LVL3 (GOWN DISPOSABLE) ×2 IMPLANT
GOWN STRL REUS W/TWL LRG LVL3 (GOWN DISPOSABLE) ×4
IMPL BREAST P5.8XHI RND 490 (Breast) IMPLANT
IMPL BRST P5.8XHI RND 490CC (Breast) ×2 IMPLANT
IMPLANT BREAST GEL 490CC (Breast) ×4 IMPLANT
IV LACTATED RINGERS 1000ML (IV SOLUTION) IMPLANT
IV NS 1000ML (IV SOLUTION)
IV NS 1000ML BAXH (IV SOLUTION) IMPLANT
IV NS 500ML (IV SOLUTION)
IV NS 500ML BAXH (IV SOLUTION) IMPLANT
KIT FILL SYSTEM UNIVERSAL (SET/KITS/TRAYS/PACK) IMPLANT
LINER CANISTER 1000CC FLEX (MISCELLANEOUS) ×2 IMPLANT
NDL HYPO 25X1 1.5 SAFETY (NEEDLE) IMPLANT
NDL SAFETY ECLIPSE 18X1.5 (NEEDLE) ×1 IMPLANT
NEEDLE HYPO 18GX1.5 SHARP (NEEDLE) ×2
NEEDLE HYPO 25X1 1.5 SAFETY (NEEDLE) ×2 IMPLANT
PACK BASIN DAY SURGERY FS (CUSTOM PROCEDURE TRAY) ×2 IMPLANT
PAD ALCOHOL SWAB (MISCELLANEOUS) ×1 IMPLANT
PENCIL BUTTON HOLSTER BLD 10FT (ELECTRODE) ×2 IMPLANT
PIN SAFETY STERILE (MISCELLANEOUS) IMPLANT
SIZER BREAST REUSE 490CC (SIZER) ×2
SIZER BRST REUSE P5.8XHI 490CC (SIZER) IMPLANT
SLEEVE SCD COMPRESS KNEE MED (MISCELLANEOUS) ×2 IMPLANT
SPONGE LAP 18X18 RF (DISPOSABLE) ×5 IMPLANT
STRIP SUTURE WOUND CLOSURE 1/2 (SUTURE) IMPLANT
SUT MNCRL AB 4-0 PS2 18 (SUTURE) ×3 IMPLANT
SUT MON AB 3-0 SH 27 (SUTURE) ×4
SUT MON AB 3-0 SH27 (SUTURE) ×4 IMPLANT
SUT MON AB 5-0 PS2 18 (SUTURE) ×4 IMPLANT
SUT PDS AB 2-0 CT2 27 (SUTURE) IMPLANT
SUT VIC AB 3-0 SH 27 (SUTURE)
SUT VIC AB 3-0 SH 27X BRD (SUTURE) IMPLANT
SUT VICRYL 4-0 PS2 18IN ABS (SUTURE) IMPLANT
SYR 10ML LL (SYRINGE) ×8 IMPLANT
SYR 3ML 18GX1 1/2 (SYRINGE) IMPLANT
SYR 50ML LL SCALE MARK (SYRINGE) ×3 IMPLANT
SYR BULB IRRIGATION 50ML (SYRINGE) ×2 IMPLANT
SYR CONTROL 10ML LL (SYRINGE) ×2 IMPLANT
SYR TOOMEY 50ML (SYRINGE) ×2 IMPLANT
TOWEL GREEN STERILE FF (TOWEL DISPOSABLE) ×4 IMPLANT
TUBE CONNECTING 20X1/4 (TUBING) ×2 IMPLANT
TUBING INFILTRATION IT-10001 (TUBING) IMPLANT
TUBING SET GRADUATE ASPIR 12FT (MISCELLANEOUS) ×1 IMPLANT
UNDERPAD 30X30 (UNDERPADS AND DIAPERS) ×4 IMPLANT
YANKAUER SUCT BULB TIP NO VENT (SUCTIONS) ×2 IMPLANT

## 2019-06-30 NOTE — Interval H&P Note (Signed)
History and Physical Interval Note:  06/30/2019 7:26 AM  [name]  has presented today for surgery, with the diagnosis of Acquired Absence Of Bilateral Breasts And Nipples, Malignant neoplasm of upper-outer quadrant of right breast in female, estrogen receptor positive.  The various methods of treatment have been discussed with the patient and family. After consideration of risks, benefits and other options for treatment, the patient has consented to  Procedure(s) with comments: REMOVAL OF BILATERAL TISSUE EXPANDERS WITH PLACEMENT OF BILATERAL BREAST IMPLANTS (Bilateral) LATERAL LIPOSUCTION (Bilateral) - surgery time total should be 3 hours. as a surgical intervention.  The patient's history has been reviewed, patient examined, no change in status, stable for surgery.  I have reviewed the patient's chart and labs.  Questions were answered to the patient's satisfaction.     Loel Lofty Dillingham

## 2019-06-30 NOTE — Discharge Instructions (Signed)
Post Anesthesia Home Care Instructions  Activity: Get plenty of rest for the remainder of the day. A responsible individual must stay with you for 24 hours following the procedure.  For the next 24 hours, DO NOT: -Drive a car -Paediatric nurse -Drink alcoholic beverages -Take any medication unless instructed by your physician -Make any legal decisions or sign important papers.  Meals: Start with liquid foods such as gelatin or soup. Progress to regular foods as tolerated. Avoid greasy, spicy, heavy foods. If nausea and/or vomiting occur, drink only clear liquids until the nausea and/or vomiting subsides. Call your physician if vomiting continues.  Special Instructions/Symptoms: Your throat may feel dry or sore from the anesthesia or the breathing tube placed in your throat during surgery. If this causes discomfort, gargle with warm salt water. The discomfort should disappear within 24 hours.  If you had a scopolamine patch placed behind your ear for the management of post- operative nausea and/or vomiting:  1. The medication in the patch is effective for 72 hours, after which it should be removed.  Wrap patch in a tissue and discard in the trash. Wash hands thoroughly with soap and water. 2. You may remove the patch earlier than 72 hours if you experience unpleasant side effects which may include dry mouth, dizziness or visual disturbances. 3. Avoid touching the patch. Wash your hands with soap and water after contact with the patch.      INSTRUCTIONS FOR AFTER SURGERY   You are having surgery.  You will likely have some questions about what to expect following your operation.  The following information will help you and your family understand what to expect when you are discharged from the hospital.  Following these guidelines will help ensure a smooth recovery and reduce risks of complications.   Postoperative instructions include information on: diet, wound care, medications and  physical activity.  AFTER SURGERY Expect to go home after the procedure.  In some cases, you may need to spend one night in the hospital for observation.  DIET This surgery does not require a specific diet.  However, I have to mention that the healthier you eat the better your body can start healing. It is important to increasing your protein intake.  This means limiting the foods with sugar and carbohydrates.  Focus on vegetables and some meat.  If you have any liposuction during your procedure be sure to drink water.  If your urine is bright yellow, then it is concentrated, and you need to drink more water.  As a general rule after surgery, you should have 8 ounces of water every hour while awake.  If you find you are persistently nauseated or unable to take in liquids let us know.  NO TOBACCO USE or EXPOSURE.  This will slow your healing process and increase the risk of a wound.  WOUND CARE If you don't have a drain: You can shower the day after surgery.  Use fragrance free soap.  Dial, [place] and [place] are usually mild on the skin.  If you have steri-strips / tape directly attached to your skin leave them in place. It is OK to get these wet.  No baths, pools or hot tubs for two weeks. We close your incision to leave the smallest and best-looking scar. No ointment or creams on your incisions until given the go ahead.  Especially not Neosporin (Too many skin reactions with this one).  A few weeks after surgery you can use Mederma and start massaging  the scar. We ask you to wear your binder or sports bra for the first 6 weeks around the clock, including while sleeping. This provides added comfort and helps reduce the fluid accumulation at the surgery site.  ACTIVITY No heavy lifting until cleared by the doctor.  It is OK to walk and climb stairs. In fact, moving your legs is very important to decrease your risk of a blood clot.  It will also help keep you from getting deconditioned.  Every 1 to 2 hours  get up and walk for 5 minutes. This will help with a quicker recovery back to normal.  Let pain be your guide so you don't do too much.  NO, you cannot do the spring cleaning and don't plan on taking care of anyone else.  This is your time for TLC.  You will be more comfortable if you sleep and rest with your head elevated either with a few pillows under you or in a recliner.  No stomach sleeping for a few months.  WORK Everyone returns to work at different times. As a rough guide, most people take at least 1 - 2 weeks off prior to returning to work. If you need documentation for your job, bring the forms to your postoperative follow up visit.  DRIVING Arrange for someone to bring you home from the hospital.  You may be able to drive a few days after surgery but not while taking any narcotics or valium.  BOWEL MOVEMENTS Constipation can occur after anesthesia and while taking pain medication.  It is important to stay ahead for your comfort.  We recommend taking Milk of Magnesia (2 tablespoons; twice a day) while taking the pain pills.  SEROMA This is fluid your body tried to put in the surgical site.  This is normal but if it creates tight skinny skin let us know.  It usually decreases in a few weeks.  WHEN TO CALL Call your surgeon's office if any of the following occur:  Fever 101 degrees F or greater  Excessive bleeding or fluid from the incision site.  Pain that increases over time without aid from the medications  Redness, warmth, or pus draining from incision sites  Persistent nausea or inability to take in liquids  Severe misshapen area that underwent the operation.

## 2019-06-30 NOTE — Transfer of Care (Signed)
Immediate Anesthesia Transfer of Care Note  Patient: [name]  Procedure(s) Performed: REMOVAL OF BILATERAL TISSUE EXPANDERS WITH PLACEMENT OF BILATERAL BREAST IMPLANTS (Bilateral Breast)  Patient Location: PACU  Anesthesia Type:General  Level of Consciousness: sedated  Airway & Oxygen Therapy: Patient Spontanous Breathing and Patient connected to nasal cannula oxygen  Post-op Assessment: Report given to RN and Post -op Vital signs reviewed and stable  Post vital signs: Reviewed and stable  Last Vitals:  Vitals Value Taken Time  BP 121/82 06/30/19 1013  Temp    Pulse 103 06/30/19 1013  Resp    SpO2 98 % 06/30/19 1013  Vitals shown include unvalidated device data.  Last Pain:  Vitals:   06/30/19 0734  TempSrc: Oral  PainSc: 3          Complications: No apparent anesthesia complications

## 2019-06-30 NOTE — Anesthesia Postprocedure Evaluation (Signed)
Anesthesia Post Note  Patient: [name]  Procedure(s) Performed: REMOVAL OF BILATERAL TISSUE EXPANDERS WITH PLACEMENT OF BILATERAL BREAST IMPLANTS (Bilateral Breast)     Patient location during evaluation: PACU Anesthesia Type: General Level of consciousness: awake and alert Pain management: pain level controlled Vital Signs Assessment: post-procedure vital signs reviewed and stable Respiratory status: spontaneous breathing, nonlabored ventilation and respiratory function stable Cardiovascular status: blood pressure returned to baseline and stable Postop Assessment: no apparent nausea or vomiting Anesthetic complications: no    Last Vitals:  Vitals:   06/30/19 1100 06/30/19 1115  BP: 133/80 136/83  Pulse: (!) 105 98  Resp: 17 18  Temp:  36.7 C  SpO2: 96% 98%    Last Pain:  Vitals:   06/30/19 1115  TempSrc:   PainSc: 0-No pain                 Lidia Collum

## 2019-06-30 NOTE — Op Note (Signed)
Op report Bilateral Exchange   DATE OF OPERATION: 06/30/2019  LOCATION: [place]  SURGICAL DIVISION: Plastic Surgery  PREOPERATIVE DIAGNOSES:  1. History of breast cancer.  2. Acquired absence of bilateral breast.   POSTOPERATIVE DIAGNOSES:  1. History of breast cancer.  2. Acquired absence of bilateral breast.   PROCEDURE:  1. Bilateral exchange of tissue expanders for implants.  2. Bilateral capsulotomies for implant respositioning.  SURGEON: Claire Sanger Dillingham, DO  ASSISTANT: [place], RNFA  ANESTHESIA:  General.   COMPLICATIONS: None.   IMPLANTS: Left - Mentor Smooth Round High Profile Gel 490cc. Ref #[id number].  Serial Number [id number] Right - Mentor Smooth Round High Profile Gel 490cc. Ref #[id number], Serial Number [id number]  INDICATIONS FOR PROCEDURE:  The patient, [name], is a 53 y.o. female born on [birth year], is here for treatment after bilateral mastectomies.  She had tissue expanders placed at the time of mastectomies. She now presents for exchange of her expanders for implants.  She requires capsulotomies to better position the implants. MRN: [id number]  CONSENT:  Informed consent was obtained directly from the patient. Risks, benefits and alternatives were fully discussed. Specific risks including but not limited to bleeding, infection, hematoma, seroma, scarring, pain, implant infection, implant extrusion, capsular contracture, asymmetry, wound healing problems, and need for further surgery were all discussed. The patient did have an ample opportunity to have her questions answered to her satisfaction.   DESCRIPTION OF PROCEDURE:  The patient was taken to the operating room. SCDs were placed and IV antibiotics were given. The patient's chest was prepped and draped in a sterile fashion. A time out was performed and the implants to be used were identified.    On the right breast: One percent Lidocaine with epinephrine  was used to infiltrate at the incision site. The old mastectomy scar was incised.  The mastectomy flaps from the superior and inferior flaps were raised over the pectoralis major muscle for several centimeters to minimize tension for the closure. The pectoralis was split inferior to the skin incision to expose and remove the tissue expander.  Inspection of the pocket showed a normal healthy capsule and good integration of the biologic matrix.  The pocket was irrigated with antibiotic solution.  Circumferential capsulotomies were performed to allow for breast pocket expansion.  The medial capsule was excised for better contour. Measurements were made and a sizer used to confirm adequate pocket size for the implant dimensions.  Hemostasis was ensured with electrocautery. New gloves were placed. The implant was soaked in antibiotic solution and then placed in the pocket and oriented appropriately. The pectoralis major muscle and capsule on the anterior surface were re-closed with a 3-0 Monocryl suture. The remaining skin was closed with 4-0 Monocryl deep dermal and 5-0 Monocryl subcuticular stitches.   On the left breast: The old mastectomy scar was incised.  The mastectomy flaps from the superior and inferior flaps were raised over the pectoralis major muscle for several centimeters to minimize tension for the closure. The pectoralis was split inferior to the skin incision to expose and remove the tissue expander.  Inspection of the pocket showed a normal healthy capsule and good integration of the biologic matrix.   Circumferential capsulotomies were performed to allow for breast pocket expansion.  Measurements were made and a sizer utilized to confirm adequate pocket size for the implant dimensions.  Hemostasis was ensured with the electrocautery.  New gloves were applied. The implant was soaked in antibiotic solution and placed  in the pocket and oriented appropriately. The pectoralis major muscle and capsule  on the anterior surface were re-closed with a 3-0 Monocryl suture. The remaining skin was closed with 4-0 Monocryl deep dermal and 5-0 Monocryl subcuticular stitches.  Dermabond was applied to the incision site. A breast binder and ABDs were placed.  The patient was allowed to wake from anesthesia and taken to the recovery room in satisfactory condition.   The RNFA assisted throughout the case.  The RNFA was essential in retraction and counter traction when needed to make the case progress smoothly.  This retraction and assistance made it possible to see the tissue plans for the procedure.  The assistance was needed for blood control, tissue re-approximation and assisted with closure of the incision site.

## 2019-06-30 NOTE — Anesthesia Procedure Notes (Signed)
Procedure Name: Intubation Date/Time: 06/30/2019 8:34 AM Performed by: Willa Frater, CRNA Pre-anesthesia Checklist: Patient identified, Emergency Drugs available, Suction available and Patient being monitored Patient Re-evaluated:Patient Re-evaluated prior to induction Oxygen Delivery Method: Circle system utilized Preoxygenation: Pre-oxygenation with 100% oxygen Induction Type: IV induction Ventilation: Mask ventilation without difficulty Laryngoscope Size: Mac and 3 Grade View: Grade I Tube type: Oral Number of attempts: 1 Airway Equipment and Method: Stylet and Oral airway Placement Confirmation: ETT inserted through vocal cords under direct vision,  positive ETCO2 and breath sounds checked- equal and bilateral Secured at: 22 cm Tube secured with: Tape Dental Injury: Teeth and Oropharynx as per pre-operative assessment

## 2019-06-30 NOTE — Anesthesia Preprocedure Evaluation (Addendum)
Anesthesia Evaluation  Patient identified by MRN, date of birth, ID band Patient awake    Reviewed: Allergy & Precautions, NPO status , Patient's Chart, lab work & pertinent test results  History of Anesthesia Complications (+) PONVNegative for: history of anesthetic complications  Airway Mallampati: I  TM Distance: >3 FB Neck ROM: Full    Dental  (+) Teeth Intact   Pulmonary neg pulmonary ROS,    Pulmonary exam normal        Cardiovascular negative cardio ROS Normal cardiovascular exam     Neuro/Psych PSYCHIATRIC DISORDERS Anxiety negative neurological ROS     GI/Hepatic Neg liver ROS, GERD  ,  Endo/Other  negative endocrine ROS  Renal/GU negative Renal ROS  negative genitourinary   Musculoskeletal negative musculoskeletal ROS (+)   Abdominal   Peds  Hematology negative hematology ROS (+)   Anesthesia Other Findings   Reproductive/Obstetrics                            Anesthesia Physical Anesthesia Plan  ASA: II  Anesthesia Plan: General   Post-op Pain Management:    Induction: Intravenous  PONV Risk Score and Plan: 4 or greater and Ondansetron, Dexamethasone, Midazolam, Treatment may vary due to age or medical condition and Scopolamine patch - Pre-op  Airway Management Planned: Oral ETT  Additional Equipment: None  Intra-op Plan:   Post-operative Plan: Extubation in OR  Informed Consent: I have reviewed the patients History and Physical, chart, labs and discussed the procedure including the risks, benefits and alternatives for the proposed anesthesia with the patient or authorized representative who has indicated his/her understanding and acceptance.     Dental advisory given  Plan Discussed with:   Anesthesia Plan Comments:        Anesthesia Quick Evaluation

## 2019-07-01 ENCOUNTER — Encounter (HOSPITAL_BASED_OUTPATIENT_CLINIC_OR_DEPARTMENT_OTHER): Payer: Self-pay | Admitting: Plastic Surgery

## 2019-07-09 ENCOUNTER — Encounter: Payer: Self-pay | Admitting: Plastic Surgery

## 2019-07-09 ENCOUNTER — Ambulatory Visit (INDEPENDENT_AMBULATORY_CARE_PROVIDER_SITE_OTHER): Payer: BC Managed Care – PPO | Admitting: Plastic Surgery

## 2019-07-09 ENCOUNTER — Other Ambulatory Visit: Payer: Self-pay

## 2019-07-09 ENCOUNTER — Telehealth: Payer: Self-pay | Admitting: Plastic Surgery

## 2019-07-09 VITALS — BP 110/75 | HR 77 | Temp 97.8°F | Ht 63.0 in | Wt 174.8 lb

## 2019-07-09 DIAGNOSIS — Z9013 Acquired absence of bilateral breasts and nipples: Secondary | ICD-10-CM

## 2019-07-09 DIAGNOSIS — Z9882 Breast implant status: Secondary | ICD-10-CM

## 2019-07-09 DIAGNOSIS — Z9889 Other specified postprocedural states: Secondary | ICD-10-CM | POA: Insufficient documentation

## 2019-07-09 NOTE — Progress Notes (Signed)
   Subjective:    Patient ID: [name], female    DOB: 11/17/1966, 53 y.o.   MRN: [id number]  The patient is a 53 yrs old wf here for a follow up on her bilateral breast reconstruction.  She is very pleased with her results to this point.  She had a little bit of fluid retention on the left lateral breast probably 5 cc or so we can drain this if we need to but I want to give her a chance to do this on her own.  The incisions are nicely healing.  She has good symmetry the breasts are soft.  There is no sign of infection.  There is no redness or sign of a hematoma.  We talked about some possible revisions.  Anything would have to be done minimally because of the care she has to provide for her daughter.  She is interested in nipple areole a tattoo.   Review of Systems  Constitutional: Negative.  Negative for activity change and appetite change.  Respiratory: Negative.   Cardiovascular: Negative.   Musculoskeletal: Negative.   Hematological: Negative.   Psychiatric/Behavioral: Negative.        Objective:   Physical Exam Vitals signs and nursing note reviewed.  Constitutional:      Appearance: Normal appearance.  HENT:     Head: Normocephalic and atraumatic.  Cardiovascular:     Rate and Rhythm: Normal rate.     Pulses: Normal pulses.  Neurological:     General: No focal deficit present.     Mental Status: She is alert. Mental status is at baseline.  Psychiatric:        Mood and Affect: Mood normal.        Behavior: Behavior normal.        Assessment & Plan:     ICD-10-CM   1. Acquired absence of bilateral breasts and nipples  Z90.13   2. S/P breast reconstruction  Z98.82   Continue compression and sports bra.  Follow-up in 1 to 2 weeks.  We will plan on nipple areole a tattoo and I will give the information to [facility] to get her on the list. Pictures were obtained of the patient and placed in the chart with the patient's or guardian's permission.

## 2019-07-09 NOTE — Telephone Encounter (Signed)
Patient called wanting to know if she is now available to resume normal activities such as working out. She forgot to ask Dr. Marla Roe during her visit today.

## 2019-07-12 NOTE — Telephone Encounter (Signed)
Called and [facility] @ 9:58am @ [phone]) informing the patient that I was just following-up to make sure she received my message from last Friday.  Informed the patient that if she has any other questions she can give me a call back.//AB/CMA

## 2019-07-12 NOTE — Telephone Encounter (Signed)
Called on (07/09/19) and [facility] @ 3:22pm @ [phone] informing the patient that I spoke with Dr. Marla Roe regarding her message below, and she said yes she may resume her normal activities but, she should do it at a slow pace.  Do not rush back into it to quick.  Also informed the patient that I will call back on Monday to make sure she received the message.//AB/CMA

## 2019-07-15 NOTE — Progress Notes (Signed)
Location of Breast Cancer:Malignant neoplasm of upper-outer quadrant of right breast in female, estrogen receptor positive [facility]  Histology per Pathology Report: Diagnosis 01/15/19  Breast, right, needle core biopsy, upper outer - INVASIVE AND IN SITU MAMMARY CARCINOMA WITH CALCIFICATIONS. - SEE COMMENT. 1 of 3 FINAL for Hanneman, Oktober P ([id number]) Microscopic Comment The carcinoma appears grade I. The malignant cells are positive for cytokeratin AE1/AE3 and negative for E-cadherin. A breast prognostic profile will be performed and the results reported separately. Dr Vicente Males has reviewed the case and concurs with this interpretation. The results are called to [facility] on 01/18/2019. (JBK:ah:ecj 01/18/19)Diagnosis 1. Breast, simple mastectomy, Left - BENIGN BREAST PARENCHYMA WITH FIBROCYSTIC CHANGE. - NEGATIVE FOR CARCINOMA. 2. Breast, simple mastectomy, Right - LOBULAR CARCINOMA IN SITU, INTERMEDIATE TO HIGH NUCLEAR GRADE. SEE NOTE. - NO EVIDENCE OF RESIDUAL INVASIVE CARCINOMA. - RESECTION MARGINS ARE NEGATIVE FOR IN SITU OR INVASIVE CARCINOMA. - BIOPSY SITE CHANGES. - SEE ONCOLOGY TABLE. 3. Lymph node, sentinel, biopsy, Right #1 - IMMUNOHISTOCHEMICAL STAIN FOR AE1/AE3 IS NEGATIVE FOR CARCINOMA (0/1). 4. Lymph node, sentinel, biopsy, Right - IMMUNOHISTOCHEMICAL STAIN FOR AE1/AE3 IS NEGATIVE FOR CARCINOMA (0/1). 5. Lymph node, sentinel, biopsy, Right #2 - IMMUNOHISTOCHEMICAL STAIN FOR AE1/AE3 IS NEGATIVE FOR CARCINOMA (0/1). 6. Lymph node, sentinel, biopsy, Right - IMMUNOHISTOCHEMICAL STAIN FOR AE1/AE3 IS NEGATIVE FOR CARCINOMA (0/1). 7. Lymph node, sentinel, biopsy, Right - METASTATIC CARCINOMA TO ONE LYMPH NODE (1/1) (CONFIRMED WITH IMMUNOHISTOCHEMICAL STAIN FOR AE1/AE3) - FOCUS OF METASTATIC CARCINOMA MEASURES JUST OVER 0.2 CM IN GREATEST DIMENSION - NO EVIDENCE OF EXTRANODAL EXTENSION  Receptor Status: ER(10%), PR (0%), Her2-neu (neg), Ki-(<1%)  Did  patient present with symptoms (if so, please note symptoms) or was this found on screening mammography?: Screening mammogram  Past/Anticipated interventions by surgeon, if WJX:BJYNWGNFA NEOPLASM OF LOWER-OUTER QUADRANT OF RIGHT BREAST OF FEMALE, ESTROGEN RECEPTOR POSITIVE (C50.511) Impression: The patient has a known lobular cancer in the outer aspect of the right breast. Her recent MRI suggested that the size of the area involved was closer to 6-1/2 cm. Because of this she favors bilateral mastectomies. She is also interested in reconstruction and has met with plastic surgery. I have discussed with her in detail the risks and benefits of the operation as well as some of the technical aspects and she understands and wishes to proceed. We will plan for right mastectomy sentinel node mapping and left prophylactic mastectomy in the next couple weeks  Past/Anticipated interventions by medical oncology, if any: Chemotherapy   adjuvant Taxol and Carboplatin every 3 weeks for 4 cycles starting in 2 weeks   Lymphedema issues, if any:  none   Pain issues, if any:  Pain in knee  SAFETY ISSUES:  Prior radiation? no  Pacemaker/ICD?no  Possible current pregnancy?no  Is the patient on methotrexate? no  Current Complaints / other details:  Patient in for consult for right breast cancer. States she had expanders placed 3 weeks ago today. She has a special needs daughter and has no day nurse.Questions answered and concerns addressed.    Ross Marcus, RN 07/15/2019,1:58 PM

## 2019-07-21 ENCOUNTER — Ambulatory Visit
Admission: RE | Admit: 2019-07-21 | Discharge: 2019-07-21 | Disposition: A | Discharge status: Home / Self Care | Payer: BC Managed Care – PPO | Source: Ambulatory Visit | Attending: Radiation Oncology | Admitting: Radiation Oncology

## 2019-07-21 ENCOUNTER — Encounter: Payer: Self-pay | Admitting: Radiation Oncology

## 2019-07-21 ENCOUNTER — Other Ambulatory Visit: Payer: Self-pay

## 2019-07-21 VITALS — BP 112/67 | HR 72 | Temp 98.5°F | Resp 18 | Ht 63.0 in | Wt 172.0 lb

## 2019-07-21 DIAGNOSIS — Z79899 Other long term (current) drug therapy: Secondary | ICD-10-CM | POA: Diagnosis not present

## 2019-07-21 DIAGNOSIS — Z170 Estrogen receptor positive status [ER+]: Secondary | ICD-10-CM | POA: Insufficient documentation

## 2019-07-21 DIAGNOSIS — C50411 Malignant neoplasm of upper-outer quadrant of right female breast: Secondary | ICD-10-CM | POA: Insufficient documentation

## 2019-07-21 DIAGNOSIS — Z510 Encounter for antineoplastic radiation therapy: Secondary | ICD-10-CM | POA: Insufficient documentation

## 2019-07-21 DIAGNOSIS — Z9013 Acquired absence of bilateral breasts and nipples: Secondary | ICD-10-CM | POA: Diagnosis not present

## 2019-07-21 DIAGNOSIS — M25569 Pain in unspecified knee: Secondary | ICD-10-CM | POA: Diagnosis not present

## 2019-07-21 NOTE — Progress Notes (Signed)
Radiation Oncology         [phone] ________________________________  Name: [name] MRN: [id number]  Date: 07/21/2019  DOB: [birth year]  Re-Evaluation Note  CC: Tiffany Musca, MD    ICD-10-CM   1. Malignant neoplasm of upper-outer quadrant of right breast in female, estrogen receptor positive ([place])  C50.411    Z17.0     Diagnosis:   Stage IB (pT1a, pN1a, cM0), Right Breast UOQ Invasive Lobular Carcinoma, ER+ / PR- / Her2-, Grade 1  Narrative:  The patient returns today to discuss radiation treatment options. She was seen in the multidisciplinary breast clinic on 01/27/2019.   Since consultation, she underwent bilateral breast MRI on 01/29/2019, which revealed: the biopsy-proven site of malignancy in the upper-outer quadrant of the right breast resides centrally within an area of suspicious non-mass enhancement spanning 6-7 cm; 6 mm suspicious enhancing mass in the lower-inner quadrant of the right breast, posterior depth; 9 mm indeterminate enhancing mass in the lower-outer quadrant of the right breast; no evidence of malignancy in the left breast.  She opted to proceed with bilateral mastectomies on 02/24/2019 due to the suspicious findings on MRI, with the left being prophylactic. This was followed immediately by reconstruction. Pathology from the procedure revealed: right breast- lobular carcinoma in situ, intermediate to high nuclear grade, no evidence of residual invasive carcinoma, resection margins negative; one out of five lymph nodes showed metastatic carcinoma (1/5), focus of metastatic carcinoma measures just over 0.2 cm, no evidence of extranodal extension; left breast benign with fibrocystic change.    Mammaprint was requested on the final surgical sample, but there was insufficient tumor present for testing.  Per Dr. Ernestina Penna note, "Given her PR/HER2 negative with low ER positive disease and positive lymph node the biology of the tumor indicates  it is likely more aggressive and potential moderate to higher risk of recurrence. To reduce her risk of recurrence I recommend her adjuvant chemotherapy." She was treated with docetaxel and cytoxan every 3 weeks x4 from 04/01/2019 - 06/03/2019.  She underwent cosmetic implant placement/tissue expanders removal on 06/30/2019 under Dr. Marla Roe, whom she last saw on 07/09/2019.  On review of systems, the patient reports knee pain. She denies any lymphedema issues and any other symptoms. Her daughter has SMARD and is 29, so [name] cares for her full time.   Allergies:  is allergic to codeine and tramadol.  Meds: Current Outpatient Medications  Medication Sig Dispense Refill  . acetaminophen (TYLENOL) 500 MG tablet Take 1,000 mg by mouth every 6 (six) hours as needed for moderate pain.    Marland Kitchen anastrozole (ARIMIDEX) 1 MG tablet Take 1 tablet (1 mg total) by mouth daily. 30 tablet 2  . Cholecalciferol (VITAMIN D3) 125 MCG (5000 UT) CAPS Take 5,000 Units by mouth daily.    . Cyanocobalamin (B-12) 2500 MCG TABS Take 2,500 mcg by mouth daily.    . cyclobenzaprine (FLEXERIL) 10 MG tablet Take 1 tablet (10 mg total) by mouth 2 (two) times daily as needed for muscle spasms. 30 tablet 0  . Diclofenac Sodium CR 100 MG 24 hr tablet Take 1 tablet (100 mg total) by mouth daily. 5 tablet 0  . docusate sodium (COLACE) 100 MG capsule Take 100 mg by mouth daily.    Marland Kitchen ibuprofen (ADVIL,MOTRIN) 200 MG tablet Take 600 mg by mouth every 6 (six) hours as needed for moderate pain.     . Iron-Vitamin C 65-125 MG TABS Take 1 tablet by  mouth daily.    . LORazepam (ATIVAN) 0.5 MG tablet Take 1 tablet (0.5 mg total) by mouth every 8 (eight) hours as needed for anxiety. 20 tablet 0  . Multiple Vitamin (MULTIVITAMIN WITH MINERALS) TABS tablet Take 1 tablet by mouth daily.    . omeprazole (PRILOSEC) 20 MG capsule Take 20 mg by mouth daily.    . ondansetron (ZOFRAN) 4 MG tablet Take 1 tablet (4 mg total) by mouth every 8 (eight)  hours as needed for nausea or vomiting. 20 tablet 0  . prochlorperazine (COMPAZINE) 10 MG tablet Take 1 tablet (10 mg total) by mouth every 6 (six) hours as needed (Nausea or vomiting). 30 tablet 1  . venlafaxine XR (EFFEXOR-XR) 75 MG 24 hr capsule TAKE 1 CAPSULE (75 MG TOTAL) BY MOUTH DAILY WITH BREAKFAST. 90 capsule 1  . zolpidem (AMBIEN) 5 MG tablet Take 1 tablet (5 mg total) by mouth at bedtime as needed for sleep. 30 tablet 1   No current facility-administered medications for this encounter.     Physical Findings: The patient is in no acute distress. Patient is alert and oriented.  height is 5' 3" (1.6 m) and weight is 172 lb (78 kg). Her temporal temperature is 98.5 F (36.9 C). Her blood pressure is 112/67 and her pulse is 72. Her respiration is 18 and oxygen saturation is 100%.  No significant changes. Lungs are clear to auscultation bilaterally. Heart has regular rate and rhythm. No palpable cervical, supraclavicular, or axillary adenopathy. Abdomen soft, non-tender, normal bowel sounds. Left Chest: reconstructed breast with permanent implant in place, mastectomy scar well healed without signs of drainage or infection. Patient has some soft tissue swelling and this may be drained later this week by Dr. Dillingham. Right Chest: reconstructed breast with permanent implant in place, mastectomy scar well healed without signs of drainage or infection.  Lab Findings: Lab Results  Component Value Date   WBC 8.9 06/03/2019   HGB 10.4 (L) 06/03/2019   HCT 32.5 (L) 06/03/2019   MCV 92.6 06/03/2019   PLT 339 06/03/2019    Radiographic Findings: No results found.  Impression:  Right Breast Invasive Lobular Carcinoma s/p mastectomy.   At the time of surgery, the patient was found to have one sentinel lymph node with metastatic disease (macrometastases), despite not finding any residual invasive disease in the mastectomy specimen. Given these pathologic findings, the patient would be at risk  for local regional recurrence, and we would recommend post mastectomy radiation therapy. Since the pathology did not show any residual invasive disease in the breast and the initial tumor was quite small, I do not feel that a boost to the mastectomy scar is indicated. She will receive 5 weeks of radiation therapy to the right chest and locoregional area.  Plan:  Patient is scheduled for CT simulation later today with treatments beginning approximately a week later. However, we will delay initiation of treatment if Dr. Dillingham feels further healing is indicated with her examination later this week.   ____________________________________ James Kinard, MD   This document serves as a record of services personally performed by James Kinard, MD. It was created on his behalf by Katie Daubenspeck, a trained medical scribe. The creation of this record is based on the scribe's personal observations and the provider's statements to them. This document has been checked and approved by the attending provider.  

## 2019-07-21 NOTE — Patient Instructions (Signed)
Coronavirus (COVID-19) Are you at risk?  Are you at risk for the Coronavirus (COVID-19)?  To be considered HIGH RISK for Coronavirus (COVID-19), you have to meet the following criteria:  . Traveled to [place], [place], [place], [place] or [place]; or in the [place] to [place], [place], [place], or [place]; and have fever, cough, and shortness of breath within the last 2 weeks of travel OR . Been in close contact with a person diagnosed with COVID-19 within the last 2 weeks and have fever, cough, and shortness of breath . IF YOU DO NOT MEET THESE CRITERIA, YOU ARE CONSIDERED LOW RISK FOR COVID-19.  What to do if you are HIGH RISK for COVID-19?  . If you are having a medical emergency, call 911. . Seek medical care right away. Before you go to a doctor's office, urgent care or emergency department, call ahead and tell them about your recent travel, contact with someone diagnosed with COVID-19, and your symptoms. You should receive instructions from your physician's office regarding next steps of care.  . When you arrive at healthcare provider, tell the healthcare staff immediately you have returned from visiting [place], [place], [place], [place] or [place]; or traveled in the [place] to [place], [place], [place], or [place]; in the last two weeks or you have been in close contact with a person diagnosed with COVID-19 in the last 2 weeks.   . Tell the health care staff about your symptoms: fever, cough and shortness of breath. . After you have been seen by a medical provider, you will be either: o Tested for (COVID-19) and discharged home on quarantine except to seek medical care if symptoms worsen, and asked to  - Stay home and avoid contact with others until you get your results (4-5 days)  - Avoid travel on public transportation if possible (such as bus, train, or airplane) or o Sent to the Emergency Department by EMS for evaluation, COVID-19 testing, and possible  admission depending on your condition and test results.  What to do if you are LOW RISK for COVID-19?  Reduce your risk of any infection by using the same precautions used for avoiding the common cold or flu:  . Wash your hands often with soap and warm water for at least 20 seconds.  If soap and water are not readily available, use an alcohol-based hand sanitizer with at least 60% alcohol.  . If coughing or sneezing, cover your mouth and nose by coughing or sneezing into the elbow areas of your shirt or coat, into a tissue or into your sleeve (not your hands). . Avoid shaking hands with others and consider head nods or verbal greetings only. . Avoid touching your eyes, nose, or mouth with unwashed hands.  . Avoid close contact with people who are sick. . Avoid places or events with large numbers of people in one location, like concerts or sporting events. . Carefully consider travel plans you have or are making. . If you are planning any travel outside or inside the [place], visit the CDC's Travelers' Health webpage for the latest health notices. . If you have some symptoms but not all symptoms, continue to monitor at home and seek medical attention if your symptoms worsen. . If you are having a medical emergency, call 911.   ADDITIONAL HEALTHCARE OPTIONS FOR PATIENTS  [facility] / e-Visit: [email or web address]         [facility]: [phone]  [facility]: [phone]                   [facility]: [phone]   

## 2019-07-22 ENCOUNTER — Other Ambulatory Visit: Payer: Self-pay | Admitting: Nurse Practitioner

## 2019-07-22 DIAGNOSIS — C50411 Malignant neoplasm of upper-outer quadrant of right female breast: Secondary | ICD-10-CM

## 2019-07-22 DIAGNOSIS — Z170 Estrogen receptor positive status [ER+]: Secondary | ICD-10-CM

## 2019-07-22 NOTE — Progress Notes (Signed)
   Subjective:     Patient ID: [name], female    DOB: 1966/05/18, 53 y.o.   MRN: [id number]  Chief Complaint  Patient presents with  . Follow-up   HPI: The patient is a 53 y.o. female here for follow-up on her bilateral breast reconstruction. She had bilateral expanders removed with placement of implants on 06/30/19.  [name] is doing well today. She does have some intermittent left breast pain. She feels as if there might be a little bit of fluid retention.   Incisions healing nicely, c/d/i, no sign of infection. No redness.   She had a CT scan of her right chest on 07/21/19 and they are planning to begin radiation next Wednesday 07/28/19. They expect about 25-28 treatments of radiation.   Review of Systems  Constitutional: Negative for chills and fever.  HENT: Negative.   Respiratory: Negative.   Cardiovascular: Negative.   Genitourinary: Negative.   Musculoskeletal:       + breast pain (L)   Skin: Negative for itching and rash.  Neurological: Negative.      Objective:   Vital Signs BP 115/80 (BP Location: Left Arm, Patient Position: Sitting, Cuff Size: Normal)   Pulse 84   Temp (!) 97.5 F (36.4 C) (Temporal)   Ht 5\' 3"  (1.6 m)   Wt 172 lb 12.8 oz (78.4 kg)   SpO2 99%   BMI 30.61 kg/m  Vital Signs and Nursing Note Reviewed  Physical Exam  Constitutional: She is oriented to person, place, and time and well-developed, well-nourished, and in no distress. No distress.  HENT:  Head: Normocephalic and atraumatic.  Cardiovascular: Normal rate and regular rhythm.  Pulmonary/Chest: Effort normal.    Musculoskeletal: Normal range of motion.        General: No edema.  Neurological: She is alert and oriented to person, place, and time. Gait normal.  Skin: Skin is warm and dry. No rash noted. She is not diaphoretic. No erythema. No pallor.  Psychiatric: Mood and affect normal.    Assessment/Plan:     ICD-10-CM   1. S/P breast reconstruction  Z98.82   2.  Acquired absence of bilateral breasts and nipples  Z90.13      She is currently scheduled for radiation on 07/28/19. That would make her 4 weeks post-op. She is aware of the risks of radiation on wound healing post-operatively.   Overall, she is healing well. Unable to determine if there is any fluid retention in the left breast. No overt signs, no excess fluid felt on exam. Will schedule for follow up on Tuesday in the office or tele-visit if no concerns for increased fluid.  Follow up on 8.11 prior to radiation.   Carola Rhine Jadah Bobak, PA-C 07/23/2019, 8:27 AM

## 2019-07-23 ENCOUNTER — Encounter: Payer: Self-pay | Admitting: Surgical

## 2019-07-23 ENCOUNTER — Other Ambulatory Visit: Payer: Self-pay

## 2019-07-23 ENCOUNTER — Ambulatory Visit: Payer: BC Managed Care – PPO | Admitting: Surgical

## 2019-07-23 ENCOUNTER — Ambulatory Visit (INDEPENDENT_AMBULATORY_CARE_PROVIDER_SITE_OTHER): Payer: BC Managed Care – PPO | Admitting: Surgical

## 2019-07-23 VITALS — BP 115/80 | HR 84 | Temp 97.5°F | Ht 63.0 in | Wt 172.8 lb

## 2019-07-23 DIAGNOSIS — Z9889 Other specified postprocedural states: Secondary | ICD-10-CM

## 2019-07-23 DIAGNOSIS — Z9882 Breast implant status: Secondary | ICD-10-CM

## 2019-07-23 DIAGNOSIS — Z9013 Acquired absence of bilateral breasts and nipples: Secondary | ICD-10-CM

## 2019-07-26 ENCOUNTER — Encounter: Payer: Self-pay | Admitting: Surgical

## 2019-07-26 ENCOUNTER — Telehealth: Payer: Self-pay | Admitting: *Deleted

## 2019-07-26 ENCOUNTER — Telehealth: Payer: Self-pay

## 2019-07-26 NOTE — Telephone Encounter (Signed)

## 2019-07-26 NOTE — Telephone Encounter (Signed)
Spoke with patient about upcoming appointment.  Per Dr. Burr Medico she can move this out until the end of xrt.  She is ok with this and I have sent a schedule msg to have her lab and appt with Dr. Burr Medico moved.  Patient states she is doing well with the anastrozole.

## 2019-07-27 ENCOUNTER — Telehealth: Payer: Self-pay | Admitting: Hematology

## 2019-07-27 ENCOUNTER — Ambulatory Visit (INDEPENDENT_AMBULATORY_CARE_PROVIDER_SITE_OTHER): Payer: BC Managed Care – PPO | Admitting: Plastic Surgery

## 2019-07-27 ENCOUNTER — Encounter: Payer: Self-pay | Admitting: Plastic Surgery

## 2019-07-27 ENCOUNTER — Other Ambulatory Visit: Payer: Self-pay

## 2019-07-27 VITALS — BP 116/80 | HR 83 | Temp 97.7°F | Ht 63.0 in | Wt 173.4 lb

## 2019-07-27 DIAGNOSIS — Z9889 Other specified postprocedural states: Secondary | ICD-10-CM

## 2019-07-27 DIAGNOSIS — Z9882 Breast implant status: Secondary | ICD-10-CM

## 2019-07-27 MED ORDER — HYDROCODONE-ACETAMINOPHEN 5-325 MG PO TABS: 1.0000 | ORAL_TABLET | Freq: Four times a day (QID) | ORAL | 0 refills | Status: AC | PRN

## 2019-07-27 NOTE — Progress Notes (Signed)
   Subjective:    Patient ID: [name], female    DOB: 1966/06/20, 53 y.o.   MRN: [id number]  Patient is a 53 year old female here for follow-up on her breast reconstruction.  She had placement of bilateral silicone implants.  The skin is healing very nicely she is now 4 weeks postop.  She has been marked for her radiation.  She has not had any fevers.  There was some seroma noted laterally but this has resolved on both breasts.  Patient is very pleased with her progress.  She notices a little bit of pain and discomfort from the surgery.  It is improving each day.     Review of Systems  Constitutional: Negative for activity change and appetite change.  Eyes: Negative.   Respiratory: Negative.  Negative for chest tightness.   Cardiovascular: Negative.   Endocrine: Negative.   Genitourinary: Negative.   Skin: Negative.        Objective:   Physical Exam Vitals signs and nursing note reviewed.  Constitutional:      Appearance: Normal appearance.  HENT:     Head: Normocephalic.  Cardiovascular:     Rate and Rhythm: Normal rate.  Pulmonary:     Effort: Pulmonary effort is normal.  Neurological:     General: No focal deficit present.     Mental Status: She is alert. Mental status is at baseline.  Psychiatric:        Mood and Affect: Mood normal.        Thought Content: Thought content normal.        Judgment: Judgment normal.        Assessment & Plan:     ICD-10-CM   1. S/P breast reconstruction  Z98.82     I spoke with Dr. Sondra Come and we will wait until the beginning of September to start with the radiation.  He is in agreement as well as the patient.  I would like to see her back in a month and we will continue to monitor her.

## 2019-07-27 NOTE — Telephone Encounter (Signed)
Scheduled appt per 8/10 sch message - pt is aware of appts.

## 2019-07-28 ENCOUNTER — Ambulatory Visit: Payer: BC Managed Care – PPO | Admitting: Radiation Oncology

## 2019-07-29 ENCOUNTER — Inpatient Hospital Stay: Payer: BC Managed Care – PPO | Admitting: Hematology

## 2019-07-29 ENCOUNTER — Inpatient Hospital Stay: Payer: BC Managed Care – PPO

## 2019-07-29 ENCOUNTER — Ambulatory Visit: Payer: BC Managed Care – PPO

## 2019-07-30 ENCOUNTER — Other Ambulatory Visit: Payer: Self-pay | Admitting: Hematology

## 2019-07-30 ENCOUNTER — Other Ambulatory Visit: Payer: Self-pay

## 2019-07-30 ENCOUNTER — Ambulatory Visit: Payer: BC Managed Care – PPO

## 2019-07-30 ENCOUNTER — Other Ambulatory Visit: Payer: Self-pay | Admitting: *Deleted

## 2019-07-30 ENCOUNTER — Ambulatory Visit (HOSPITAL_COMMUNITY)
Admission: RE | Admit: 2019-07-30 | Discharge: 2019-07-30 | Disposition: A | Discharge status: Home / Self Care | Payer: BC Managed Care – PPO | Source: Ambulatory Visit | Attending: Hematology | Admitting: Hematology

## 2019-07-30 ENCOUNTER — Inpatient Hospital Stay: Payer: BC Managed Care – PPO | Attending: Hematology | Admitting: Medical

## 2019-07-30 ENCOUNTER — Telehealth: Payer: Self-pay | Admitting: *Deleted

## 2019-07-30 VITALS — BP 123/91 | HR 77 | Temp 98.0°F | Resp 18 | Ht 63.0 in | Wt 173.3 lb

## 2019-07-30 DIAGNOSIS — M25511 Pain in right shoulder: Secondary | ICD-10-CM | POA: Diagnosis not present

## 2019-07-30 DIAGNOSIS — Z170 Estrogen receptor positive status [ER+]: Secondary | ICD-10-CM | POA: Insufficient documentation

## 2019-07-30 DIAGNOSIS — F419 Anxiety disorder, unspecified: Secondary | ICD-10-CM | POA: Diagnosis not present

## 2019-07-30 DIAGNOSIS — C50411 Malignant neoplasm of upper-outer quadrant of right female breast: Secondary | ICD-10-CM | POA: Insufficient documentation

## 2019-07-30 DIAGNOSIS — R21 Rash and other nonspecific skin eruption: Secondary | ICD-10-CM | POA: Diagnosis not present

## 2019-07-30 DIAGNOSIS — Z803 Family history of malignant neoplasm of breast: Secondary | ICD-10-CM | POA: Insufficient documentation

## 2019-07-30 DIAGNOSIS — R509 Fever, unspecified: Secondary | ICD-10-CM | POA: Insufficient documentation

## 2019-07-30 DIAGNOSIS — K219 Gastro-esophageal reflux disease without esophagitis: Secondary | ICD-10-CM | POA: Insufficient documentation

## 2019-07-30 DIAGNOSIS — M7521 Bicipital tendinitis, right shoulder: Secondary | ICD-10-CM

## 2019-07-30 DIAGNOSIS — M129 Arthropathy, unspecified: Secondary | ICD-10-CM | POA: Insufficient documentation

## 2019-07-30 DIAGNOSIS — Z79811 Long term (current) use of aromatase inhibitors: Secondary | ICD-10-CM | POA: Insufficient documentation

## 2019-07-30 DIAGNOSIS — G629 Polyneuropathy, unspecified: Secondary | ICD-10-CM | POA: Diagnosis not present

## 2019-07-30 MED ORDER — LORAZEPAM 0.5 MG PO TABS: 0.5000 mg | ORAL_TABLET | Freq: Three times a day (TID) | ORAL | 0 refills | Status: DC | PRN

## 2019-07-30 MED ORDER — GABAPENTIN 300 MG PO CAPS: 300.0000 mg | ORAL_CAPSULE | Freq: Every day | ORAL | 0 refills | Status: DC

## 2019-07-30 MED ORDER — PREDNISONE 5 MG PO TABS: ORAL_TABLET | ORAL | 0 refills | Status: DC

## 2019-07-30 NOTE — Telephone Encounter (Signed)
Received call from patient stating her right arm has been hurting since her CT sim last week.  She is complaining of severe throbbing and some slight swelling.  It is keeping her up at night.  Per Dr. Burr Medico she will get doppler U/S of right arm to rule out DVT. This is scheduled for 2pm today 8/14.  She will then see Sandi Mealy, PA after for results and evaluation.  Patient verbalized understanding.

## 2019-07-30 NOTE — Progress Notes (Signed)
Right upper extremity venous duplex completed. Refer to "CV Proc" under chart review to view preliminary results.  07/30/2019 2:51 PM Maudry Mayhew, MHA, RVT, RDCS, RDMS

## 2019-07-30 NOTE — Patient Instructions (Signed)
Excuse from Work, [employer], or Physical Activity [place] needs to be excused from: X  Work.  This is effective for the following dates: 08/13 and 07/30/2019.  Health care provider name (printed): Sandi Mealy, MHS, PA-C   Health care provider (signature): _________________________________________________________   Date: 07/30/2019  This information is not intended to replace advice given to you by your health care provider. Make sure you discuss any questions you have with your health care provider. Document Released: 05/28/2001 Document Revised: 11/27/2017 Document Reviewed: 11/27/2017 Elsevier Patient Education  2020 [employer].

## 2019-07-30 NOTE — Progress Notes (Signed)
Pt seen by PA Van only, no RN assessment at this time.  PA aware. 

## 2019-08-02 ENCOUNTER — Ambulatory Visit: Payer: BC Managed Care – PPO

## 2019-08-02 NOTE — Progress Notes (Signed)
[facility] Progress Note   [name] [id number] 11-14-1966 53 y.o.  [name] is managed by Dr. Truitt Merle  Actively treated with chemotherapy/immunotherapy/hormonal therapy: yes  Current therapy: Anastrozole  Next scheduled appointment with provider: 09/20/2019.  Assessment: Plan:    Neuropathy: Patient was given a prescription for gabapentin 300 mg p.o. nightly x3 weeks.  ER positive malignant neoplasm of the right breast: Patient continues on anastrozole and is pending radiation start on 08/17/2019.  She is scheduled to return for follow-up on 09/20/2019.  Biceps tendinitis: Patient was given a prednisone taper.  Please see After Visit Summary for patient specific instructions.  Future Appointments  Date Time Provider [place]  08/05/2019 12:30 PM Kipp Laurence, PT [facility] None  08/16/2019  3:45 PM Gery Pray, MD [facility] None  08/17/2019  3:45 PM [facility] LINAC 4 [facility] None  08/18/2019  8:00 AM [facility] LINAC 3 [facility] None  08/19/2019  8:00 AM [facility] LINAC 3 [facility] None  08/20/2019  3:45 PM [facility] LINAC 4 [facility] None  08/24/2019  8:00 AM [facility] LINAC 3 [facility] None  08/25/2019  8:00 AM [facility] LINAC 3 [facility] None  08/26/2019  3:45 PM [facility] LINAC 4 [facility] None  08/27/2019  3:45 PM [facility] LINAC 4 [facility] None  08/30/2019  8:00 AM [facility] LINAC 3 [facility] None  08/31/2019  8:00 AM [facility] LINAC 3 [facility] None  08/31/2019 10:00 AM Dillingham, Claire S, DO [facility] None  09/01/2019  8:00 AM [facility] LINAC 3 [facility] None  09/02/2019  8:00 AM [facility] [id number] [facility] None  09/03/2019  8:00 AM [facility] [id number] [facility] None  09/06/2019  8:00 AM [facility] [id number] [facility] None  09/07/2019 12:00 AM Gery Pray, MD [facility] None  09/07/2019  8:00 AM [facility] [id number] [facility] None  09/08/2019  8:00 AM [facility] [id number]  [facility] None  09/09/2019  8:00 AM [facility] [id number] [facility] None  09/10/2019  8:00 AM [facility] [id number] [facility] None  09/13/2019  8:00 AM [facility] [id number] [facility] None  09/14/2019  8:00 AM [facility] [id number] [facility] None  09/15/2019  8:00 AM [facility] [id number] [facility] None  09/16/2019  8:00 AM [facility] LINAC 3 [facility] None  09/17/2019 11:15 AM [facility] LINAC 3 [facility] None  09/20/2019  8:00 AM [facility] LINAC 3 [facility] None  09/20/2019  8:15 AM [facility] LAB 2 [facility] None  09/20/2019  8:40 AM Truitt Merle, MD [facility] None  09/21/2019  8:00 AM [facility] LINAC 3 [facility] None    No orders of the defined types were placed in this encounter.      Subjective:   Patient ID:  [name] is a 53 y.o. (DOB 1966-04-28) female.  Chief Complaint: No chief complaint on file.   HPI [name] is a 53 year old female with a diagnosis of an ER positive malignant neoplasm of the right breast who is managed by Dr. Burr Medico.  She is currently treated with anastrozole and is pending the start of radiation therapy on 08/17/2019.  She is scheduled to see Dr. Burr Medico in follow-up on 09/20/2019.  She presents to day with a report of right shoulder pain and numbness in her right hand after having planning CT scans completed for her upcoming radiation therapy.  She describes her right arm is swollen with tingling in her hand and throbbing in her arm which is much like a toothache.  She reports that she had to be positioned with her right arm folded over her head for around 45 minutes during her scans.  She  also has a daughter who has special needs.  She does not currently have a nurse who is there during the day to assist in her daughter's care.  She has a evening rash fever is there intermittently.  She has had to take a more active role in the care of her daughter who is bedbound and on a ventilator.  She has to help to roll her daughter and to  move her up in the bed.  She denies any trauma.  She has been taking pain medications and something for sleep without relief.  She is to begin physical therapy next Thursday.  Medications: I have reviewed the patient's current medications.  Allergies:  Allergies  Allergen Reactions   Codeine Nausea And Vomiting   Tramadol Nausea And Vomiting    Past Medical History:  Diagnosis Date   Anemia    Anxiety    Arthritis    Cancer ([place])    right breast   Family history of adverse reaction to anesthesia    sister has same issues   GERD (gastroesophageal reflux disease)    PONV (postoperative nausea and vomiting)     Past Surgical History:  Procedure Laterality Date   BREAST RECONSTRUCTION WITH PLACEMENT OF TISSUE EXPANDER AND FLEX HD (ACELLULAR HYDRATED DERMIS) Bilateral 02/24/2019   Procedure: BREAST RECONSTRUCTION WITH PLACEMENT OF TISSUE EXPANDER AND FLEX HD (ACELLULAR HYDRATED DERMIS);  Surgeon: Wallace Going, DO;  Location: [place];  Service: Plastics;  Laterality: Bilateral;   BREAST SURGERY     left breast bx   EYE SURGERY     dart to the eye  (right)   FACIAL FRACTURE SURGERY     FRACTURE SURGERY     KNEE SURGERY     MASTECTOMY W/ SENTINEL NODE BIOPSY Bilateral 02/24/2019   Procedure: RIGHT MASTECTOMY WITH SENTINEL LYMPH NODE MAPPING AND LEFT PROPHYLACTIC MASTECTOMY;  Surgeon: Jovita Kussmaul, MD;  Location: [place];  Service: General;  Laterality: Bilateral;   REMOVAL OF BILATERAL TISSUE EXPANDERS WITH PLACEMENT OF BILATERAL BREAST IMPLANTS Bilateral 06/30/2019   Procedure: REMOVAL OF BILATERAL TISSUE EXPANDERS WITH PLACEMENT OF BILATERAL BREAST IMPLANTS;  Surgeon: Wallace Going, DO;  Location: [place];  Service: Plastics;  Laterality: Bilateral;   SHOULDER SURGERY      Family History  Problem Relation Age of Onset   Brain cancer Father    Cancer Maternal Uncle        melanoma    Cancer Paternal Uncle        gastric cancer     Spinal muscular atrophy Daughter    Spinal muscular atrophy Child    Breast cancer Neg Hx     Social History   Socioeconomic History   Marital status: Married    Spouse name: Not on file   Number of children: Not on file   Years of education: Not on file   Highest education level: Not on file  Occupational History   Not on file  Social Needs   Financial resource strain: Not on file   Food insecurity    Worry: Not on file    Inability: Not on file   Transportation needs    Medical: Not on file    Non-medical: Not on file  Tobacco Use   Smoking status: Never Smoker   Smokeless tobacco: Never Used  Substance and Sexual Activity   Alcohol use: Yes    Comment: occasional   Drug use: No   Sexual activity: Not on  file  Lifestyle   Physical activity    Days per week: Not on file    Minutes per session: Not on file   Stress: Not on file  Relationships   Social connections    Talks on phone: Not on file    Gets together: Not on file    Attends religious service: Not on file    Active member of club or organization: Not on file    Attends meetings of clubs or organizations: Not on file    Relationship status: Not on file   Intimate partner violence    Fear of current or ex partner: Not on file    Emotionally abused: Not on file    Physically abused: Not on file    Forced sexual activity: Not on file  Other Topics Concern   Not on file  Social History Narrative   Not on file    Past Medical History, Surgical history, Social history, and Family history were reviewed and updated as appropriate.   Please see review of systems for further details on the patient's review from today.   Review of Systems:  Review of Systems  Constitutional: Negative for chills, diaphoresis and fever.  HENT: Negative for trouble swallowing and voice change.   Respiratory: Negative for cough, chest tightness, shortness of breath and wheezing.   Cardiovascular:  Negative for chest pain and palpitations.  Gastrointestinal: Negative for abdominal pain, constipation, diarrhea, nausea and vomiting.  Musculoskeletal: Positive for arthralgias and joint swelling. Negative for back pain and myalgias.  Neurological: Positive for numbness. Negative for dizziness, light-headedness and headaches.    Objective:   Physical Exam:  BP (!) 123/91 (Patient Position: Sitting)    Pulse 77    Temp 98 F (36.7 C)    Resp 18    Ht 5\' 3"  (1.6 m)    Wt 173 lb 4.8 oz (78.6 kg)    SpO2 100%    BMI 30.70 kg/m  ECOG: 1  Physical Exam Constitutional:      General: She is not in acute distress.    Appearance: She is not diaphoretic.  HENT:     Head: Normocephalic and atraumatic.  Eyes:     General: No scleral icterus.       Right eye: No discharge.        Left eye: No discharge.     Conjunctiva/sclera: Conjunctivae normal.  Cardiovascular:     Rate and Rhythm: Normal rate and regular rhythm.     Heart sounds: Normal heart sounds. No murmur. No friction rub. No gallop.      Comments: Radius and ulna pulses are intact in the right upper extremity. Pulmonary:     Effort: Pulmonary effort is normal. No respiratory distress.     Breath sounds: Normal breath sounds. No wheezing or rales.  Musculoskeletal: Normal range of motion.        General: Swelling and tenderness present. No deformity or signs of injury.     Comments: Swelling of the right upper extremity. Tenderness over the right biceps tendon.  Skin:    General: Skin is warm and dry.     Findings: No erythema or rash.  Neurological:     Mental Status: She is alert.     Gait: Gait normal.  Psychiatric:        Mood and Affect: Mood normal.        Behavior: Behavior normal.        Thought Content: Thought content normal.  Judgment: Judgment normal.     Lab Review:     Component Value Date/Time   NA 140 06/03/2019 0909   K 3.3 (L) 06/03/2019 0909   CL 105 06/03/2019 0909   CO2 26 06/03/2019 0909    GLUCOSE 96 06/03/2019 0909   BUN 14 06/03/2019 0909   CREATININE 0.72 06/03/2019 0909   CALCIUM 10.1 06/03/2019 0909   PROT 7.4 06/03/2019 0909   ALBUMIN 4.1 06/03/2019 0909   AST 14 (L) 06/03/2019 0909   ALT 14 06/03/2019 0909   ALKPHOS 70 06/03/2019 0909   BILITOT 0.4 06/03/2019 0909   GFRNONAA >60 06/03/2019 0909   GFRAA >60 06/03/2019 0909       Component Value Date/Time   WBC 8.9 06/03/2019 0909   WBC 15.1 (H) 03/05/2019 0040   RBC 3.51 (L) 06/03/2019 0909   HGB 10.4 (L) 06/03/2019 0909   HCT 32.5 (L) 06/03/2019 0909   PLT 339 06/03/2019 0909   MCV 92.6 06/03/2019 0909   MCH 29.6 06/03/2019 0909   MCHC 32.0 06/03/2019 0909   RDW 15.9 (H) 06/03/2019 0909   LYMPHSABS 1.2 06/03/2019 0909   MONOABS 1.0 06/03/2019 0909   EOSABS 0.0 06/03/2019 0909   BASOSABS 0.0 06/03/2019 0909   -------------------------------  Imaging from last 24 hours (if applicable):  Radiology interpretation: Vas [place] Upper Extremity Venous Duplex  Result Date: 08/01/2019 UPPER VENOUS STUDY  Indications: Pain, and Swelling Comparison Study: No prior study Performing Technologist: Maudry Mayhew MHA, RDMS, RVT, RDCS  Examination Guidelines: A complete evaluation includes B-mode imaging, spectral Doppler, color Doppler, and power Doppler as needed of all accessible portions of each vessel. Bilateral testing is considered an integral part of a complete examination. Limited examinations for reoccurring indications may be performed as noted.  Right Findings: +----------+------------+---------+-----------+----------+-------+  RIGHT      Compressible Phasicity Spontaneous Properties Summary  +----------+------------+---------+-----------+----------+-------+  IJV            Full        Yes        Yes                         +----------+------------+---------+-----------+----------+-------+  Subclavian     Full        Yes        Yes                          +----------+------------+---------+-----------+----------+-------+  Axillary       Full        Yes        Yes                         +----------+------------+---------+-----------+----------+-------+  Brachial       Full        Yes        Yes                         +----------+------------+---------+-----------+----------+-------+  Radial         Full                                               +----------+------------+---------+-----------+----------+-------+  Ulnar          Full                                               +----------+------------+---------+-----------+----------+-------+  Cephalic       Full                                               +----------+------------+---------+-----------+----------+-------+  Basilic        Full                                               +----------+------------+---------+-----------+----------+-------+  Left Findings: +----------+------------+---------+-----------+----------+-------+  LEFT       Compressible Phasicity Spontaneous Properties Summary  +----------+------------+---------+-----------+----------+-------+  Subclavian                 Yes        Yes                         +----------+------------+---------+-----------+----------+-------+  Summary:  Right: No evidence of deep vein thrombosis in the upper extremity. No evidence of superficial vein thrombosis in the upper extremity.  Left: No evidence of thrombosis in the subclavian.  *See table(s) above for measurements and observations.  Diagnosing physician: Ruta Hinds MD Electronically signed by Ruta Hinds MD on 08/01/2019 at 12:14:40 PM.    Final         This case was discussed with Dr. Burr Medico. She expressed agreement with my management of this patient.

## 2019-08-03 ENCOUNTER — Ambulatory Visit: Payer: BC Managed Care – PPO

## 2019-08-03 DIAGNOSIS — C50411 Malignant neoplasm of upper-outer quadrant of right female breast: Secondary | ICD-10-CM | POA: Diagnosis not present

## 2019-08-04 ENCOUNTER — Ambulatory Visit: Payer: BC Managed Care – PPO

## 2019-08-05 ENCOUNTER — Other Ambulatory Visit: Payer: Self-pay

## 2019-08-05 ENCOUNTER — Ambulatory Visit: Payer: BC Managed Care – PPO | Attending: Plastic Surgery | Admitting: Physical Therapy

## 2019-08-05 ENCOUNTER — Ambulatory Visit: Payer: BC Managed Care – PPO

## 2019-08-05 DIAGNOSIS — R293 Abnormal posture: Secondary | ICD-10-CM | POA: Insufficient documentation

## 2019-08-05 DIAGNOSIS — Z483 Aftercare following surgery for neoplasm: Secondary | ICD-10-CM | POA: Insufficient documentation

## 2019-08-05 DIAGNOSIS — M25511 Pain in right shoulder: Secondary | ICD-10-CM | POA: Insufficient documentation

## 2019-08-05 NOTE — Patient Instructions (Signed)

## 2019-08-05 NOTE — Therapy (Signed)
[place], [place], [address] Phone: [phone]   Fax:  [phone]  Physical Therapy Evaluation  Patient Details  Name: [name] MRN: [id number] Date of Birth: 1966-10-27 Referring Provider (PT): Dr. Marla Roe    Encounter Date: 08/05/2019  PT End of Session - 08/05/19 1321    Visit Number  1    Number of Visits  9    Date for PT Re-Evaluation  09/06/19    PT Start Time  1230    PT Stop Time  1310    PT Time Calculation (min)  40 min    Activity Tolerance  Patient tolerated treatment well    Behavior During Therapy  [facility] for tasks assessed/performed       Past Medical History:  Diagnosis Date  . Anemia   . Anxiety   . Arthritis   . Cancer [facility])    right breast  . Family history of adverse reaction to anesthesia    sister has same issues  . GERD (gastroesophageal reflux disease)   . PONV (postoperative nausea and vomiting)     Past Surgical History:  Procedure Laterality Date  . BREAST RECONSTRUCTION WITH PLACEMENT OF TISSUE EXPANDER AND FLEX HD (ACELLULAR HYDRATED DERMIS) Bilateral 02/24/2019   Procedure: BREAST RECONSTRUCTION WITH PLACEMENT OF TISSUE EXPANDER AND FLEX HD (ACELLULAR HYDRATED DERMIS);  Surgeon: Wallace Going, DO;  Location: [place];  Service: Plastics;  Laterality: Bilateral;  . BREAST SURGERY     left breast bx  . EYE SURGERY     dart to the eye  (right)  . FACIAL FRACTURE SURGERY    . FRACTURE SURGERY    . KNEE SURGERY    . MASTECTOMY W/ SENTINEL NODE BIOPSY Bilateral 02/24/2019   Procedure: RIGHT MASTECTOMY WITH SENTINEL LYMPH NODE MAPPING AND LEFT PROPHYLACTIC MASTECTOMY;  Surgeon: Jovita Kussmaul, MD;  Location: [place];  Service: General;  Laterality: Bilateral;  . REMOVAL OF BILATERAL TISSUE EXPANDERS WITH PLACEMENT OF BILATERAL BREAST IMPLANTS Bilateral 06/30/2019   Procedure: REMOVAL OF BILATERAL TISSUE EXPANDERS WITH PLACEMENT OF BILATERAL BREAST  IMPLANTS;  Surgeon: Wallace Going, DO;  Location: [place];  Service: Plastics;  Laterality: Bilateral;  . SHOULDER SURGERY      There were no vitals filed for this visit.   Subjective Assessment - 08/05/19 1234    Subjective  Pt had episode of right shoulder pain after CT simulation from radiation.  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 It measures 7 mm and is located in the lower outer quadrant. It is ER positive, PR negative, and HER2 negative with a Ki67 of < 1%. She has a history of a right shoulder scope in 2015.  Pt had bilateral mastectomy on 02/24/2019 with 1/5 nodes positive on the right ( left mastectomy prophylactic, adjuvant chemo 04/01/2019-06/03/2019, expanders removed and implants placed 06/30/2019, will be starting radiation Aug 31    Currently in Pain?  Yes    Pain Score  1     Pain Location  Arm    Pain Orientation  Right    Pain Descriptors / Indicators  Throbbing;Aching;Tingling    Pain Type  Acute pain    Pain Onset  1 to 4 weeks ago   since  CT scan   Pain Frequency  Constant    Aggravating Factors   can't say    Pain Relieving Factors  can't say, but the medicine is helping         [facility] PT Assessment - 08/05/19 0001      Assessment   Medical Diagnosis  Right breast cancer    Referring Provider (PT)  Dr. Marla Roe     Onset Date/Surgical Date  01/12/19    Hand Dominance  Right    Prior Therapy  None      Precautions   Precautions  Other (comment)    Precaution Comments  at risk for lymphedema       Restrictions   Weight Bearing Restrictions  No      Balance Screen   Has the patient fallen in the past 6 months  No    Has the patient had a decrease in activity level because of a fear of falling?   No    Is the patient  reluctant to leave their home because of a fear of falling?   No      Home Environment   Living Environment  Private residence    Living Arrangements  Spouse/significant other;Children   Husband and 29 y.o. 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    Leisure  walks when she has a Marine scientist to take care of daughter       Cognition   Overall Cognitive Status  Within Functional Limits for tasks assessed      Observation/Other Assessments   Observations  pt occasionally holds her arm in protective position, but states it is so much better. She feels that she will be ok during the actual radiaiton sessions because she will not have to hold it up so long     Other Surveys   Quick Dash    Quick DASH   43.18      Sensation   Light Touch  Not tested      Coordination   Gross Motor Movements are Fluid and Coordinated  No   occasional protection posture of right arm      Posture/Postural Control   Posture/Postural Control  Postural limitations    Postural Limitations  Rounded Shoulders;Forward head;Increased lumbar lordosis;Increased thoracic kyphosis    Posture Comments  pt states she has a detioratting disc in back and with scapular retraction she can feel it will aggavate it       ROM / Strength   AROM / PROM / Strength  AROM      AROM   Right Shoulder Extension  --    Right Shoulder Flexion  150 Degrees    Right Shoulder ABduction  170 Degrees    Right Shoulder Internal Rotation  --    Right Shoulder External Rotation  --    Left Shoulder Extension  --    Left Shoulder Flexion  150 Degrees    Left Shoulder ABduction  170 Degrees    Left Shoulder Internal Rotation  --    Left Shoulder External Rotation   80 Degrees    Cervical Flexion  --  Cervical Extension  --    Cervical - Right Side Bend  --    Cervical - Left Side Bend  --    Cervical - Right Rotation  --    Cervical - Left Rotation  --      Strength   Overall Strength  Within functional limits for tasks performed   strenth of both shoulders about 4-/5   Overall Strength Comments  pt appears to have generalized deconditioning . she reports a weight gain from chemo       Palpation   Palpation comment  very tender to palpation of right biceps tenson, but pt reports the brief cross friction massage "feels good"         LYMPHEDEMA/ONCOLOGY QUESTIONNAIRE - 08/05/19 1315      Right Upper Extremity Lymphedema   10 cm Proximal to Olecranon Process  33.5 cm    Olecranon Process  25.5 cm    10 cm Proximal to Ulnar Styloid Process  22.5 cm    Just Proximal to Ulnar Styloid Process  19 cm    Across Hand at [employer]  15.5 cm    At [place] of 2nd Digit  6 cm      Left Upper Extremity Lymphedema   10 cm Proximal to Olecranon Process  33.3 cm    Olecranon Process  25 cm    10 cm Proximal to Ulnar Styloid Process  23.5 cm    Just Proximal to Ulnar Styloid Process  15.5 cm    Across Hand at [employer]  19 cm    At [place] of 2nd Digit  5.5 cm          Quick Dash - 08/05/19 0001    Open a tight or new jar  Moderate difficulty    Do heavy household chores (wash walls, wash floors)  No difficulty    Carry a shopping bag or briefcase  Mild difficulty    Wash your back  Mild difficulty    Use a knife to cut food  No difficulty    Recreational activities in which you take some force or impact through your arm, shoulder, or hand (golf, hammering, tennis)  Mild difficulty    During the past week, to what extent has your arm, shoulder or hand problem interfered with your normal social activities with family, friends, neighbors, or groups?  Modererately    During the past week, to what extent has your arm, shoulder or hand problem  limited your work or other regular daily activities  Quite a bit    Arm, shoulder, or hand pain.  Severe    Tingling (pins and needles) in your arm, shoulder, or hand  Severe    Difficulty Sleeping  Severe difficulty    DASH Score  43.18 %        Objective measurements completed on examination: See above findings.      [facility] Adult PT Treatment/Exercise - 08/05/19 0001      Exercises   Exercises  Shoulder      Shoulder Exercises: Supine   Other Supine Exercises  meeks decompression exercises  pt instructed to keep at least one kneee bent at all times to protect back from increased lordosis, she reports pain in right knee with doing leg extensions and leg presses     Other Supine Exercises  supine dowel flexion                   PT Long  Term Goals - 08/05/19 1607      PT LONG TERM GOAL #1   Title  Pt will report the Pain in her shoulder is decreased to 1/10    Time  4    Period  Weeks    Status  New      PT LONG TERM GOAL #2   Title  Pt will be independent in a home exercise program for postural improvment, shoulder stretching    Time  4    Period  Weeks    Status  New      PT LONG TERM GOAL #3   Title  Pt will decrease quick DASH score to < 20 indicating an improvment in function of arms    Baseline  43.18    Time  4    Period  Weeks    Status  New             Plan - 08/05/19 1321    Clinical Impression Statement  Pt devloped irritation to right shoulder after having to hold it overhead for prolonged time during radiation CT simulation.  She has received prednisone and gabapentin and feel her symptoms are much better.  She has limited abilty to come to PT as her husband has to come home to take care of their daughter, but she would like to return to PT for soft tissue work to shoulder and learn more stretching exercises.    Personal Factors and Comorbidities  Comorbidity 3+    Comorbidities  rifht shoulder scope in 2015, bilateral mastectomy with 5  nodes removed, bilateral implant placement, chemo therapy,    Examination-Participation Restrictions  --   will be having radiation   Stability/Clinical Decision Making  Evolving/Moderate complexity    Clinical Decision Making  Moderate    Rehab Potential  Good    Clinical Impairments Affecting Rehab Potential  will be having radiation , has recent bilatera implant surgery    PT Frequency  2x / week   1-2 times a week, pt will call as she is able to come   PT Duration  4 weeks    PT Treatment/Interventions  ADLs/Self Care Home Management;Patient/family education;Therapeutic exercise;Taping;Manual techniques;Joint Manipulations;Therapeutic activities;Neuromuscular re-education;Passive range of motion   no ionto as pt is just finishing steroid   PT Next Visit Plan  see if pt able to do meeks, review supine dowel stretches and give handout. cross friction massage to right biceps, PROM and stretching, postureal ex with precaution for back increased lordosis  teach home program for stretching including doorway stretch with attemtion to posture    PT Home Exercise Plan  [place] ,    Consulted and Agree with Plan of Care  Patient       Patient will benefit from skilled therapeutic intervention in order to improve the following deficits and impairments:  Postural dysfunction, Pain, Impaired UE functional use, Increased fascial restricitons, Decreased strength, Decreased activity tolerance, Impaired perceived functional ability  Visit Diagnosis: Aftercare following surgery for neoplasm - Plan: PT plan of care cert/re-cert  Acute pain of right shoulder - Plan: PT plan of care cert/re-cert  Abnormal posture - Plan: PT plan of care cert/re-cert     Problem List Patient Active Problem List   Diagnosis Date Noted  . S/P breast reconstruction 07/09/2019  . Right knee pain 06/22/2019  . Acquired absence of bilateral breasts and nipples 03/02/2019  . Breast cancer ([place]) 02/24/2019  . Malignant  neoplasm of upper-outer quadrant of right breast  in female, estrogen receptor positive ([place]) 01/21/2019  . Iron deficiency anemia 11/23/2018  . Pain in right knee 03/18/2018  . Injury of right knee 03/18/2018  . GERD (gastroesophageal reflux disease) 05/21/2016   Donato Heinz. Owens Shark PT  Norwood Levo 08/05/2019, 4:09 PM  [place], [place], [address] Phone: [phone]   Fax:  [phone]  Name: [name] MRN: [id number] Date of Birth: 12-Aug-1966

## 2019-08-06 ENCOUNTER — Ambulatory Visit: Payer: BC Managed Care – PPO

## 2019-08-09 ENCOUNTER — Ambulatory Visit: Payer: BC Managed Care – PPO

## 2019-08-10 ENCOUNTER — Ambulatory Visit: Payer: BC Managed Care – PPO

## 2019-08-11 ENCOUNTER — Ambulatory Visit: Payer: BC Managed Care – PPO

## 2019-08-12 ENCOUNTER — Ambulatory Visit: Payer: BC Managed Care – PPO

## 2019-08-13 ENCOUNTER — Ambulatory Visit: Payer: BC Managed Care – PPO

## 2019-08-16 ENCOUNTER — Ambulatory Visit
Admission: RE | Admit: 2019-08-16 | Discharge: 2019-08-16 | Disposition: A | Discharge status: Home / Self Care | Payer: BC Managed Care – PPO | Source: Ambulatory Visit | Attending: Radiation Oncology | Admitting: Radiation Oncology

## 2019-08-16 ENCOUNTER — Other Ambulatory Visit: Payer: Self-pay

## 2019-08-16 DIAGNOSIS — C50411 Malignant neoplasm of upper-outer quadrant of right female breast: Secondary | ICD-10-CM | POA: Diagnosis not present

## 2019-08-16 DIAGNOSIS — Z170 Estrogen receptor positive status [ER+]: Secondary | ICD-10-CM

## 2019-08-16 NOTE — Progress Notes (Signed)
  Radiation Oncology         [phone] ________________________________  Name: [name] MRN: [id number]  Date: 08/16/2019  DOB: 02-09-1966  Simulation Verification Note    ICD-10-CM   1. Malignant neoplasm of upper-outer quadrant of right breast in female, estrogen receptor positive ([place])  C50.411    Z17.0     Status: outpatient  NARRATIVE: The patient was brought to the treatment unit and placed in the planned treatment position. The clinical setup was verified. Then port films were obtained and uploaded to the radiation oncology medical record software.  The treatment beams were carefully compared against the planned radiation fields. The position location and shape of the radiation fields was reviewed. They targeted volume of tissue appears to be appropriately covered by the radiation beams. Organs at risk appear to be excluded as planned.  Based on my personal review, I approved the simulation verification. The patient's treatment will proceed as planned.  -----------------------------------  Blair Promise, PhD, MD

## 2019-08-17 ENCOUNTER — Ambulatory Visit
Admission: RE | Admit: 2019-08-17 | Discharge: 2019-08-17 | Disposition: A | Discharge status: Home / Self Care | Payer: BC Managed Care – PPO | Source: Ambulatory Visit | Attending: Radiation Oncology | Admitting: Radiation Oncology

## 2019-08-17 ENCOUNTER — Other Ambulatory Visit: Payer: Self-pay

## 2019-08-17 DIAGNOSIS — Z510 Encounter for antineoplastic radiation therapy: Secondary | ICD-10-CM | POA: Insufficient documentation

## 2019-08-17 DIAGNOSIS — C50411 Malignant neoplasm of upper-outer quadrant of right female breast: Secondary | ICD-10-CM | POA: Insufficient documentation

## 2019-08-17 DIAGNOSIS — Z170 Estrogen receptor positive status [ER+]: Secondary | ICD-10-CM | POA: Insufficient documentation

## 2019-08-18 ENCOUNTER — Ambulatory Visit
Admission: RE | Admit: 2019-08-18 | Discharge: 2019-08-18 | Disposition: A | Discharge status: Home / Self Care | Payer: BC Managed Care – PPO | Source: Ambulatory Visit | Attending: Radiation Oncology | Admitting: Radiation Oncology

## 2019-08-18 ENCOUNTER — Other Ambulatory Visit: Payer: Self-pay

## 2019-08-18 DIAGNOSIS — C50411 Malignant neoplasm of upper-outer quadrant of right female breast: Secondary | ICD-10-CM | POA: Diagnosis not present

## 2019-08-18 MED ORDER — ALRA NON-METALLIC DEODORANT (RAD-ONC)
1.0000 "application " | Freq: Once | TOPICAL | Status: AC
  Administered 2019-08-18: 1 via TOPICAL

## 2019-08-18 MED ORDER — RADIAPLEXRX EX GEL
Freq: Once | CUTANEOUS | Status: AC
  Administered 2019-08-18: 12:00:00 via TOPICAL

## 2019-08-19 ENCOUNTER — Ambulatory Visit
Admission: RE | Admit: 2019-08-19 | Discharge: 2019-08-19 | Disposition: A | Discharge status: Home / Self Care | Payer: BC Managed Care – PPO | Source: Ambulatory Visit | Attending: Radiation Oncology | Admitting: Radiation Oncology

## 2019-08-19 ENCOUNTER — Other Ambulatory Visit: Payer: Self-pay

## 2019-08-19 DIAGNOSIS — C50411 Malignant neoplasm of upper-outer quadrant of right female breast: Secondary | ICD-10-CM | POA: Diagnosis not present

## 2019-08-20 ENCOUNTER — Ambulatory Visit
Admission: RE | Admit: 2019-08-20 | Discharge: 2019-08-20 | Disposition: A | Discharge status: Home / Self Care | Payer: BC Managed Care – PPO | Source: Ambulatory Visit | Attending: Radiation Oncology | Admitting: Radiation Oncology

## 2019-08-20 ENCOUNTER — Other Ambulatory Visit: Payer: Self-pay

## 2019-08-20 DIAGNOSIS — C50411 Malignant neoplasm of upper-outer quadrant of right female breast: Secondary | ICD-10-CM | POA: Diagnosis not present

## 2019-08-23 ENCOUNTER — Other Ambulatory Visit: Payer: Self-pay | Admitting: Hematology

## 2019-08-24 ENCOUNTER — Ambulatory Visit
Admission: RE | Admit: 2019-08-24 | Discharge: 2019-08-24 | Disposition: A | Discharge status: Home / Self Care | Payer: BC Managed Care – PPO | Source: Ambulatory Visit | Attending: Radiation Oncology | Admitting: Radiation Oncology

## 2019-08-24 ENCOUNTER — Other Ambulatory Visit: Payer: Self-pay

## 2019-08-24 ENCOUNTER — Telehealth: Payer: Self-pay | Admitting: Medical Oncology

## 2019-08-24 DIAGNOSIS — C50411 Malignant neoplasm of upper-outer quadrant of right female breast: Secondary | ICD-10-CM | POA: Diagnosis not present

## 2019-08-24 NOTE — Telephone Encounter (Signed)
Pt stated that she still cannot sleep.   I recommended she call her PCP in [place]. She said 'OKAY".

## 2019-08-25 ENCOUNTER — Other Ambulatory Visit: Payer: Self-pay

## 2019-08-25 ENCOUNTER — Other Ambulatory Visit: Payer: Self-pay | Admitting: Radiation Oncology

## 2019-08-25 ENCOUNTER — Ambulatory Visit
Admission: RE | Admit: 2019-08-25 | Discharge: 2019-08-25 | Disposition: A | Discharge status: Home / Self Care | Payer: BC Managed Care – PPO | Source: Ambulatory Visit | Attending: Radiation Oncology | Admitting: Radiation Oncology

## 2019-08-25 DIAGNOSIS — C50411 Malignant neoplasm of upper-outer quadrant of right female breast: Secondary | ICD-10-CM | POA: Diagnosis not present

## 2019-08-25 MED ORDER — ZOLPIDEM TARTRATE 5 MG PO TABS: 5.0000 mg | ORAL_TABLET | Freq: Every evening | ORAL | 1 refills | Status: DC | PRN

## 2019-08-26 ENCOUNTER — Ambulatory Visit
Admission: RE | Admit: 2019-08-26 | Discharge: 2019-08-26 | Disposition: A | Discharge status: Home / Self Care | Payer: BC Managed Care – PPO | Source: Ambulatory Visit | Attending: Radiation Oncology | Admitting: Radiation Oncology

## 2019-08-26 ENCOUNTER — Other Ambulatory Visit: Payer: Self-pay | Admitting: Hematology

## 2019-08-26 ENCOUNTER — Other Ambulatory Visit: Payer: Self-pay

## 2019-08-26 DIAGNOSIS — C50411 Malignant neoplasm of upper-outer quadrant of right female breast: Secondary | ICD-10-CM | POA: Diagnosis not present

## 2019-08-27 ENCOUNTER — Ambulatory Visit
Admission: RE | Admit: 2019-08-27 | Discharge: 2019-08-27 | Disposition: A | Discharge status: Home / Self Care | Payer: BC Managed Care – PPO | Source: Ambulatory Visit | Attending: Radiation Oncology | Admitting: Radiation Oncology

## 2019-08-27 ENCOUNTER — Other Ambulatory Visit: Payer: Self-pay

## 2019-08-27 DIAGNOSIS — C50411 Malignant neoplasm of upper-outer quadrant of right female breast: Secondary | ICD-10-CM | POA: Diagnosis not present

## 2019-08-30 ENCOUNTER — Other Ambulatory Visit: Payer: Self-pay

## 2019-08-30 ENCOUNTER — Ambulatory Visit
Admission: RE | Admit: 2019-08-30 | Discharge: 2019-08-30 | Disposition: A | Discharge status: Home / Self Care | Payer: BC Managed Care – PPO | Source: Ambulatory Visit | Attending: Radiation Oncology | Admitting: Radiation Oncology

## 2019-08-30 DIAGNOSIS — C50411 Malignant neoplasm of upper-outer quadrant of right female breast: Secondary | ICD-10-CM | POA: Diagnosis not present

## 2019-08-31 ENCOUNTER — Other Ambulatory Visit: Payer: Self-pay

## 2019-08-31 ENCOUNTER — Ambulatory Visit
Admission: RE | Admit: 2019-08-31 | Discharge: 2019-08-31 | Disposition: A | Discharge status: Home / Self Care | Payer: BC Managed Care – PPO | Source: Ambulatory Visit | Attending: Radiation Oncology | Admitting: Radiation Oncology

## 2019-08-31 ENCOUNTER — Ambulatory Visit: Payer: BC Managed Care – PPO | Admitting: Surgical

## 2019-08-31 DIAGNOSIS — C50411 Malignant neoplasm of upper-outer quadrant of right female breast: Secondary | ICD-10-CM | POA: Diagnosis not present

## 2019-09-01 ENCOUNTER — Ambulatory Visit
Admission: RE | Admit: 2019-09-01 | Discharge: 2019-09-01 | Disposition: A | Discharge status: Home / Self Care | Payer: BC Managed Care – PPO | Source: Ambulatory Visit | Attending: Radiation Oncology | Admitting: Radiation Oncology

## 2019-09-01 ENCOUNTER — Other Ambulatory Visit: Payer: Self-pay

## 2019-09-01 DIAGNOSIS — C50411 Malignant neoplasm of upper-outer quadrant of right female breast: Secondary | ICD-10-CM | POA: Diagnosis not present

## 2019-09-02 ENCOUNTER — Other Ambulatory Visit: Payer: Self-pay

## 2019-09-02 ENCOUNTER — Ambulatory Visit: Payer: BC Managed Care – PPO

## 2019-09-02 ENCOUNTER — Ambulatory Visit
Admission: RE | Admit: 2019-09-02 | Discharge: 2019-09-02 | Disposition: A | Discharge status: Home / Self Care | Payer: BC Managed Care – PPO | Source: Ambulatory Visit | Attending: Radiation Oncology | Admitting: Radiation Oncology

## 2019-09-02 DIAGNOSIS — C50411 Malignant neoplasm of upper-outer quadrant of right female breast: Secondary | ICD-10-CM | POA: Diagnosis not present

## 2019-09-03 ENCOUNTER — Ambulatory Visit
Admission: RE | Admit: 2019-09-03 | Discharge: 2019-09-03 | Disposition: A | Discharge status: Home / Self Care | Payer: BC Managed Care – PPO | Source: Ambulatory Visit | Attending: Radiation Oncology | Admitting: Radiation Oncology

## 2019-09-03 ENCOUNTER — Other Ambulatory Visit: Payer: Self-pay

## 2019-09-03 ENCOUNTER — Telehealth: Payer: Self-pay | Admitting: *Deleted

## 2019-09-03 DIAGNOSIS — C50411 Malignant neoplasm of upper-outer quadrant of right female breast: Secondary | ICD-10-CM | POA: Diagnosis not present

## 2019-09-03 NOTE — Telephone Encounter (Signed)
Received call from patient stating she stopped taking the anastrozole until after she has finished xrt treatments. She states she did discuss this with Dr. Sondra Come as well. Informed her I would let Dr Burr Medico know as well.  She has been having hot flashes but they have seemed to be better the last couple days.

## 2019-09-06 ENCOUNTER — Other Ambulatory Visit: Payer: Self-pay

## 2019-09-06 ENCOUNTER — Ambulatory Visit
Admission: RE | Admit: 2019-09-06 | Discharge: 2019-09-06 | Disposition: A | Discharge status: Home / Self Care | Payer: BC Managed Care – PPO | Source: Ambulatory Visit | Attending: Radiation Oncology | Admitting: Radiation Oncology

## 2019-09-06 DIAGNOSIS — C50411 Malignant neoplasm of upper-outer quadrant of right female breast: Secondary | ICD-10-CM | POA: Diagnosis not present

## 2019-09-06 NOTE — Progress Notes (Signed)
   Subjective:     Patient ID: [name], female    DOB: 06-Jul-1966, 53 y.o.   MRN: [id number]  Chief Complaint  Patient presents with  . Follow-up    1 mos from s/p breast reconstruction    HPI: The patient is a 53 y.o. female here for follow-up on her breast reconstruction.  She has silicone implants, bilaterally.  Patient started radiation at the beginning of September, she is currently halfway through her treatment course.  She has done 15 treatments at this time and has 15 more scheduled.   Her incisions are healing nicely.  They are C/D/I.  No sign of infection, seroma, hematoma.  The right breast is erythematous from the radiation treatment today. The area is itchy, but she reports she always has the same reaction after treatment.  She also has been dealing with some pressure in bilateral breast from the implants.  It feels as if someone is stepping on her chest.  She does not have any difficulty with breathing or shortness of breath.  She does not currently have any Valium.  No fever, chills, nausea, vomiting.   Review of Systems  Constitutional: Negative for chills, diaphoresis and fever.  Respiratory: Negative.   Cardiovascular: Negative.   Gastrointestinal: Negative.   Genitourinary: Negative.   Musculoskeletal: Positive for myalgias.  Skin: Positive for itching and rash.  Neurological: Negative.      Objective:   Vital Signs BP 126/85 (BP Location: Left Arm, Patient Position: Sitting, Cuff Size: Large)   Pulse 70   Temp (!) 97.3 F (36.3 C) (Temporal)   Ht 5\' 3"  (1.6 m)   Wt 176 lb 12.8 oz (80.2 kg)   SpO2 99%   BMI 31.32 kg/m  Vital Signs and Nursing Note Reviewed Chaperone present Physical Exam  Constitutional: She is oriented to person, place, and time and well-developed, well-nourished, and in no distress.  HENT:  Head: Normocephalic and atraumatic.  Cardiovascular: Normal rate.  Pulmonary/Chest: Effort normal.  Musculoskeletal: Normal range  of motion.  Neurological: She is alert and oriented to person, place, and time. Gait normal.  Skin: Skin is warm and dry. Rash noted. Rash is maculopapular. She is not diaphoretic. There is erythema. No pallor.     Psychiatric: Mood and affect normal.      Assessment/Plan:     ICD-10-CM   1. S/P breast reconstruction  Z98.82   2. Acquired absence of bilateral breasts and nipples  Z90.13   3. Malignant neoplasm of upper-outer quadrant of right breast in female, estrogen receptor positive ([place])  C50.411    Z17.0     Patient previously taking cyclobenzaprine for muscle relaxation. Will send in prescription for this as patient tolerated well in the past and it will be less drowsy for her than valium.  If pain does not improve, implants can be exchanged for a smaller size at a later time (~ 6 months minimum after radiation), this would be something we can discuss  if symptoms do not resolve.  Continue to eat a well balanced diet, drink plenty of fluids. Multivitamin  Follow up in 1 month. If needed sooner, call with questions or concerns.   Carola Rhine Allysha Tryon, PA-C 09/07/2019, 10:25 AM

## 2019-09-07 ENCOUNTER — Ambulatory Visit: Payer: BC Managed Care – PPO | Admitting: Radiation Oncology

## 2019-09-07 ENCOUNTER — Encounter: Payer: Self-pay | Admitting: Surgical

## 2019-09-07 ENCOUNTER — Ambulatory Visit (INDEPENDENT_AMBULATORY_CARE_PROVIDER_SITE_OTHER): Payer: BC Managed Care – PPO | Admitting: Surgical

## 2019-09-07 ENCOUNTER — Ambulatory Visit
Admission: RE | Admit: 2019-09-07 | Discharge: 2019-09-07 | Disposition: A | Discharge status: Home / Self Care | Payer: BC Managed Care – PPO | Source: Ambulatory Visit | Attending: Radiation Oncology | Admitting: Radiation Oncology

## 2019-09-07 ENCOUNTER — Other Ambulatory Visit: Payer: Self-pay

## 2019-09-07 VITALS — BP 126/85 | HR 70 | Temp 97.3°F | Ht 63.0 in | Wt 176.8 lb

## 2019-09-07 DIAGNOSIS — C50411 Malignant neoplasm of upper-outer quadrant of right female breast: Secondary | ICD-10-CM | POA: Diagnosis not present

## 2019-09-07 DIAGNOSIS — Z9013 Acquired absence of bilateral breasts and nipples: Secondary | ICD-10-CM

## 2019-09-07 DIAGNOSIS — Z170 Estrogen receptor positive status [ER+]: Secondary | ICD-10-CM

## 2019-09-07 DIAGNOSIS — Z9889 Other specified postprocedural states: Secondary | ICD-10-CM

## 2019-09-07 DIAGNOSIS — Z9882 Breast implant status: Secondary | ICD-10-CM

## 2019-09-07 MED ORDER — CYCLOBENZAPRINE HCL 10 MG PO TABS: 10.0000 mg | ORAL_TABLET | Freq: Two times a day (BID) | ORAL | 0 refills | Status: DC | PRN

## 2019-09-08 ENCOUNTER — Ambulatory Visit
Admission: RE | Admit: 2019-09-08 | Discharge: 2019-09-08 | Disposition: A | Discharge status: Home / Self Care | Payer: BC Managed Care – PPO | Source: Ambulatory Visit | Attending: Radiation Oncology | Admitting: Radiation Oncology

## 2019-09-08 ENCOUNTER — Other Ambulatory Visit: Payer: Self-pay

## 2019-09-08 DIAGNOSIS — C50411 Malignant neoplasm of upper-outer quadrant of right female breast: Secondary | ICD-10-CM | POA: Diagnosis not present

## 2019-09-09 ENCOUNTER — Other Ambulatory Visit: Payer: Self-pay

## 2019-09-09 ENCOUNTER — Ambulatory Visit
Admission: RE | Admit: 2019-09-09 | Discharge: 2019-09-09 | Disposition: A | Discharge status: Home / Self Care | Payer: BC Managed Care – PPO | Source: Ambulatory Visit | Attending: Radiation Oncology | Admitting: Radiation Oncology

## 2019-09-09 DIAGNOSIS — C50411 Malignant neoplasm of upper-outer quadrant of right female breast: Secondary | ICD-10-CM | POA: Diagnosis not present

## 2019-09-10 ENCOUNTER — Other Ambulatory Visit: Payer: Self-pay

## 2019-09-10 ENCOUNTER — Encounter: Payer: Self-pay | Admitting: *Deleted

## 2019-09-10 ENCOUNTER — Ambulatory Visit
Admission: RE | Admit: 2019-09-10 | Discharge: 2019-09-10 | Disposition: A | Discharge status: Home / Self Care | Payer: BC Managed Care – PPO | Source: Ambulatory Visit | Attending: Radiation Oncology | Admitting: Radiation Oncology

## 2019-09-10 DIAGNOSIS — C50411 Malignant neoplasm of upper-outer quadrant of right female breast: Secondary | ICD-10-CM | POA: Diagnosis not present

## 2019-09-13 ENCOUNTER — Other Ambulatory Visit: Payer: Self-pay

## 2019-09-13 ENCOUNTER — Ambulatory Visit
Admission: RE | Admit: 2019-09-13 | Discharge: 2019-09-13 | Disposition: A | Discharge status: Home / Self Care | Payer: BC Managed Care – PPO | Source: Ambulatory Visit | Attending: Radiation Oncology | Admitting: Radiation Oncology

## 2019-09-13 DIAGNOSIS — C50411 Malignant neoplasm of upper-outer quadrant of right female breast: Secondary | ICD-10-CM | POA: Diagnosis not present

## 2019-09-14 ENCOUNTER — Other Ambulatory Visit: Payer: Self-pay

## 2019-09-14 ENCOUNTER — Ambulatory Visit
Admission: RE | Admit: 2019-09-14 | Discharge: 2019-09-14 | Disposition: A | Discharge status: Home / Self Care | Payer: BC Managed Care – PPO | Source: Ambulatory Visit | Attending: Radiation Oncology | Admitting: Radiation Oncology

## 2019-09-14 DIAGNOSIS — C50411 Malignant neoplasm of upper-outer quadrant of right female breast: Secondary | ICD-10-CM | POA: Diagnosis not present

## 2019-09-15 ENCOUNTER — Ambulatory Visit
Admission: RE | Admit: 2019-09-15 | Discharge: 2019-09-15 | Disposition: A | Discharge status: Home / Self Care | Payer: BC Managed Care – PPO | Source: Ambulatory Visit | Attending: Radiation Oncology | Admitting: Radiation Oncology

## 2019-09-15 ENCOUNTER — Other Ambulatory Visit: Payer: Self-pay

## 2019-09-15 DIAGNOSIS — C50411 Malignant neoplasm of upper-outer quadrant of right female breast: Secondary | ICD-10-CM | POA: Diagnosis not present

## 2019-09-16 ENCOUNTER — Ambulatory Visit
Admission: RE | Admit: 2019-09-16 | Discharge: 2019-09-16 | Disposition: A | Discharge status: Home / Self Care | Payer: BC Managed Care – PPO | Source: Ambulatory Visit | Attending: Radiation Oncology | Admitting: Radiation Oncology

## 2019-09-16 ENCOUNTER — Ambulatory Visit: Payer: BC Managed Care – PPO

## 2019-09-16 ENCOUNTER — Other Ambulatory Visit: Payer: Self-pay

## 2019-09-16 DIAGNOSIS — Z170 Estrogen receptor positive status [ER+]: Secondary | ICD-10-CM | POA: Insufficient documentation

## 2019-09-16 DIAGNOSIS — C50411 Malignant neoplasm of upper-outer quadrant of right female breast: Secondary | ICD-10-CM | POA: Diagnosis present

## 2019-09-16 DIAGNOSIS — Z510 Encounter for antineoplastic radiation therapy: Secondary | ICD-10-CM | POA: Insufficient documentation

## 2019-09-16 NOTE — Progress Notes (Signed)
Simulation note The patient was brought to the treatment room for simulation for the patient's upcoming electron treatment. The patient was setup in the treatment position and the target region was delineated. The patient will receive treatment to the right chest wall (reconstructed breast) using an en face electron field. One customized block/complex treatment device has been constructed for this purpose, and this will be used on a daily basis during the patient's treatment. After appropriate set up was confirmed, skin markings were placed to allow accurate targeting of the treatment area during the patient's course of therapy.  ------------------------------------------------  -----------------------------------  Blair Promise, PhD, MD

## 2019-09-16 NOTE — Progress Notes (Signed)
[name]   [phone] [phone]   Clinic Follow up Note   Patient Care Team: [name] as PCP - General (Physician Assistant) Tiffany Kussmaul, MD as Consulting Physician (General Surgery) Tiffany Merle, MD as Consulting Physician (Hematology) Tiffany Pray, MD as Consulting Physician (Radiation Oncology) Tiffany Germany, RN as Oncology Nurse Navigator Tiffany Kaufmann, RN as Oncology Nurse Navigator  Date of Service:  09/20/2019  CHIEF COMPLAINT: F/u of right breast cancer  SUMMARY OF ONCOLOGIC HISTORY: Oncology History Overview Note  Cancer Staging Malignant neoplasm of upper-outer quadrant of right breast in female, estrogen receptor positive ([place]) Staging form: Breast, AJCC 8th Edition - Clinical stage from 01/15/2019: Stage IA (cT1b, cN0, cM0, G1, ER+, PR-, HER2-) - Signed by Tiffany Merle, MD on 01/26/2019 - Pathologic stage from 02/24/2019: Stage IB (pT1a, pN1a, cM0, G1, ER+, PR-, HER2-) - Signed by Tiffany Merle, MD on 03/18/2019     Malignant neoplasm of upper-outer quadrant of right breast in female, estrogen receptor positive ([place])  01/15/2019 Cancer Staging   Staging form: Breast, AJCC 8th Edition - Clinical stage from 01/15/2019: Stage IA (cT1b, cN0, cM0, G1, ER+, PR-, HER2-) - Signed by Tiffany Merle, MD on 01/26/2019   01/15/2019 Mammogram   Diagnostic Mammogram 01/15/19  IMPRESSION Targeted ultrasound is performed, showing right breast 10 o'clock 5 cm from the nipple hypoechoic round mass measuring 0.7 x 0.5 x 0.6 cm. Adjacent to it there is a probable complicated cyst measuring 4 mm. There is no evidence of right axillary lymphadenopathy.   01/15/2019 Initial Biopsy   Diagnosis 01/15/19  Breast, right, needle core biopsy, upper outer - INVASIVE AND IN SITU MAMMARY CARCINOMA WITH CALCIFICATIONS. - SEE COMMENT. 1 of 3 FINAL for [name] ([name]) Microscopic Comment The carcinoma appears grade I. The malignant cells  are positive for cytokeratin AE1/AE3 and negative for E-cadherin. A breast prognostic profile will be performed and the results reported separately. Dr [name] has reviewed the case and concurs with this interpretation. The results are called to [facility] on 01/18/2019. (JBK:ah:ecj 01/18/19)   01/15/2019 Receptors her2   PROGNOSTIC INDICATORS Results: IMMUNOHISTOCHEMICAL AND MORPHOMETRIC ANALYSIS PERFORMED MANUALLY The tumor cells are NEGATIVE for Her2 (0). Estrogen Receptor: 10%, POSITIVE, MODERATE STAINING INTENSITY Progesterone Receptor: 0%, NEGATIVE Proliferation Marker Ki67: <1%   01/21/2019 Initial Diagnosis   Malignant neoplasm of upper-outer quadrant of right breast in female, estrogen receptor positive ([place])   01/29/2019 Breast MRI   IMPRESSION: 1. The biopsy-proven site of malignancy in the upper-outer quadrant of the right breast resides centrally with in an area of suspicious non mass enhancement spanning 6-7 cm.  2. There is a 6 mm suspicious enhancing mass in the lower-inner quadrant of the right breast, posterior depth.  3. There is a 9 mm indeterminate enhancing mass in the lower outer quadrant of the right breast.  4.  No evidence of malignancy in the left breast.   02/24/2019 Surgery   RIGHT MASTECTOMY WITH SENTINEL LYMPH NODE MAPPING AND LEFT PROPHYLACTIC MASTECTOMY by Dr. Marlou Snyder  BREAST RECONSTRUCTION WITH PLACEMENT OF TISSUE EXPANDER by Dr. Marla Snyder  02/24/19    02/24/2019 Pathology Results   Diagnosis 1. Breast, simple mastectomy, Left - BENIGN BREAST PARENCHYMA WITH FIBROCYSTIC CHANGE. - NEGATIVE FOR CARCINOMA. 2. Breast, simple mastectomy, Right - LOBULAR CARCINOMA IN SITU, INTERMEDIATE TO HIGH NUCLEAR GRADE. SEE NOTE. - NO EVIDENCE OF RESIDUAL INVASIVE CARCINOMA. - RESECTION MARGINS ARE NEGATIVE FOR IN SITU  OR INVASIVE CARCINOMA. - BIOPSY SITE CHANGES. - SEE ONCOLOGY TABLE. 3. Lymph node, sentinel, biopsy, Right #1 -  IMMUNOHISTOCHEMICAL STAIN FOR AE1/AE3 IS NEGATIVE FOR CARCINOMA (0/1). 4. Lymph node, sentinel, biopsy, Right - IMMUNOHISTOCHEMICAL STAIN FOR AE1/AE3 IS NEGATIVE FOR CARCINOMA (0/1). 5. Lymph node, sentinel, biopsy, Right #2 - IMMUNOHISTOCHEMICAL STAIN FOR AE1/AE3 IS NEGATIVE FOR CARCINOMA (0/1). 6. Lymph node, sentinel, biopsy, Right - IMMUNOHISTOCHEMICAL STAIN FOR AE1/AE3 IS NEGATIVE FOR CARCINOMA (0/1). 7. Lymph node, sentinel, biopsy, Right - METASTATIC CARCINOMA TO ONE LYMPH NODE (1/1) (CONFIRMED WITH IMMUNOHISTOCHEMICAL STAIN FOR AE1/AE3) - FOCUS OF METASTATIC CARCINOMA MEASURES JUST OVER 0.2 CM IN GREATEST DIMENSION - NO EVIDENCE OF EXTRANODAL EXTENSION   02/24/2019 Cancer Staging   Staging form: Breast, AJCC 8th Edition - Pathologic stage from 02/24/2019: Stage IB (pT1a, pN1a, cM0, G1, ER+, PR-, HER2-) - Signed by Tiffany Merle, MD on 03/18/2019   04/01/2019 - 06/03/2019 Chemotherapy   adjuvant Taxol and Carboplatin every 3 weeks for 4 cycles starting in 2 weeks starting 04/01/19-06/03/19   06/2019 -  Anti-estrogen oral therapy   Anastrozole 84m daily starting 06/2019. Held after 2 weeks due to hot flashes and joint pain and RT.  Switched to Exemestane in 10/2019   06/30/2019 Surgery   REMOVAL OF BILATERAL TISSUE EXPANDERS WITH PLACEMENT OF BILATERAL BREAST IMPLANTS by Dr DMarla Snyder 06/30/19    08/17/2019 -  Radiation Therapy   Adjuvant Radiaiton with Dr KSondra Comeon 08/17/19 and plans to complete on 09/28/19.       CURRENT THERAPY:  Anastrozole 138mdaily starting 06/2019, held after 2 weeks due to hot flashes and joint pain and RT. Switched to Exemestane in 10/2019 Adjuvant Radiation with Dr KiSondra Snyder 08/17/19 and plans to complete on 09/28/19.    INTERVAL HISTORY:  [name] here for a follow up and treatment. She presents to the clinic alone. She notes pain of breast from Radiation. She feels has been retaining fluid because of how much her weight has gone up. She weighed about 165  pounds before cancer diagnosis. She is now 180 pounds.  She notes when she was on Anastrozole she was on for 2 weeks before RT. She had hot flashes day and night which was hard to manage. She has joint pain and neuropathy and was placed on Gabapentin. Her pain stopped but has recently returned.     REVIEW OF SYSTEMS:   Constitutional: Denies fevers, chills (+) weight gain (+) Hot flashes  Eyes: Denies blurriness of vision Ears, nose, mouth, throat, and face: Denies mucositis or sore throat Respiratory: Denies cough, dyspnea or wheezes Cardiovascular: Denies palpitation, chest discomfort or lower extremity swelling Gastrointestinal:  Denies nausea, heartburn or change in bowel habits Skin: Denies abnormal skin rashes MSK: (+) joint pain in hands and lower arm Lymphatics: Denies new lymphadenopathy or easy bruising Neurological: (+) Neuropathy of hands and lower arm  Behavioral/Psych: Mood is stable, no new changes  All other systems were reviewed with the patient and are negative.  MEDICAL HISTORY:  Past Medical History:  Diagnosis Date  . Anemia   . Anxiety   . Arthritis   . [facility]   right [facility]  . Family history of adverse reaction to anesthesia    sister has same issues  . GERD (gastroesophageal reflux disease)   . PONV (postoperative nausea and vomiting)     SURGICAL HISTORY: Past Surgical History:  Procedure Laterality Date  . BREAST RECONSTRUCTION WITH PLACEMENT OF TISSUE EXPANDER AND FLEX  HD (ACELLULAR HYDRATED DERMIS) Bilateral 02/24/2019   Procedure: BREAST RECONSTRUCTION WITH PLACEMENT OF TISSUE EXPANDER AND FLEX HD (ACELLULAR HYDRATED DERMIS);  Surgeon: Wallace Going, DO;  Location: [place];  Service: Plastics;  Laterality: Bilateral;  . BREAST SURGERY     left breast bx  . EYE SURGERY     dart to the eye  (right)  . FACIAL FRACTURE SURGERY    . FRACTURE SURGERY    . KNEE SURGERY    . MASTECTOMY W/ SENTINEL NODE BIOPSY Bilateral 02/24/2019    Procedure: RIGHT MASTECTOMY WITH SENTINEL LYMPH NODE MAPPING AND LEFT PROPHYLACTIC MASTECTOMY;  Surgeon: Tiffany Kussmaul, MD;  Location: [place];  Service: General;  Laterality: Bilateral;  . REMOVAL OF BILATERAL TISSUE EXPANDERS WITH PLACEMENT OF BILATERAL BREAST IMPLANTS Bilateral 06/30/2019   Procedure: REMOVAL OF BILATERAL TISSUE EXPANDERS WITH PLACEMENT OF BILATERAL BREAST IMPLANTS;  Surgeon: Wallace Going, DO;  Location: [place];  Service: Plastics;  Laterality: Bilateral;  . SHOULDER SURGERY      I have reviewed the social history and family history with the patient and they are unchanged from previous note.  ALLERGIES:  is allergic to codeine and tramadol.  MEDICATIONS:  Current Outpatient Medications  Medication Sig Dispense Refill  . acetaminophen (TYLENOL) 500 MG tablet Take 1,000 mg by mouth every 6 (six) hours as needed for moderate pain.    . Cholecalciferol (VITAMIN D3) 125 MCG (5000 UT) CAPS Take 5,000 Units by mouth daily.    . Cyanocobalamin (B-12) 2500 MCG TABS Take 2,500 mcg by mouth daily.    . cyclobenzaprine (FLEXERIL) 10 MG tablet Take 1 tablet (10 mg total) by mouth 2 (two) times daily as needed for muscle spasms. 30 tablet 0  . Diclofenac Sodium CR 100 MG 24 hr tablet Take 1 tablet (100 mg total) by mouth daily. 5 tablet 0  . docusate sodium (COLACE) 100 MG capsule Take 100 mg by mouth daily.    Marland Kitchen gabapentin (NEURONTIN) 300 MG capsule Take 1 capsule (300 mg total) by mouth at bedtime. 21 capsule 0  . ibuprofen (ADVIL,MOTRIN) 200 MG tablet Take 600 mg by mouth every 6 (six) hours as needed for moderate pain.     . Iron-Vitamin C 65-125 MG TABS Take 1 tablet by mouth daily.    Marland Kitchen LORazepam (ATIVAN) 0.5 MG tablet Take 1 tablet (0.5 mg total) by mouth every 8 (eight) hours as needed for anxiety. 15 tablet 0  . Multiple Vitamin (MULTIVITAMIN WITH MINERALS) TABS tablet Take 1 tablet by mouth daily.    Marland Kitchen omeprazole (PRILOSEC) 20 MG capsule Take 20 mg by  mouth daily.    . ondansetron (ZOFRAN) 4 MG tablet Take 1 tablet (4 mg total) by mouth every 8 (eight) hours as needed for nausea or vomiting. 20 tablet 0  . predniSONE (DELTASONE) 5 MG tablet 5 tab x 2 days, 4 tab x 2 days, 3 tab x 2 days, 2 tab x 2 days, 1 tab x 2 days, stop 21 tablet 0  . prochlorperazine (COMPAZINE) 10 MG tablet Take 1 tablet (10 mg total) by mouth every 6 (six) hours as needed (Nausea or vomiting). 30 tablet 1  . venlafaxine XR (EFFEXOR-XR) 75 MG 24 hr capsule TAKE 1 CAPSULE (75 MG TOTAL) BY MOUTH DAILY WITH BREAKFAST. 90 capsule 1  . zolpidem (AMBIEN) 5 MG tablet Take 1 tablet (5 mg total) by mouth at bedtime as needed for sleep. 30 tablet 1  . exemestane (AROMASIN) 25  MG tablet Take 1 tablet (25 mg total) by mouth daily after breakfast. 30 tablet 5   No current facility-administered medications for this visit.     PHYSICAL EXAMINATION: ECOG PERFORMANCE STATUS: 0 - Asymptomatic  Vitals:   09/20/19 0845  BP: 117/73  Pulse: 87  Resp: 18  Temp: 98.7 F (37.1 C)  SpO2: 100%   Filed Weights   09/20/19 0845  Weight: 180 lb 1.6 oz (81.7 kg)    GENERAL:alert, no distress and comfortable SKIN: skin color, texture, turgor are normal, no rashes or significant lesions EYES: normal, Conjunctiva are pink and non-injected, sclera clear  NECK: supple, thyroid normal size, non-tender, without nodularity LYMPH:  no palpable lymphadenopathy in the cervical, axillary  LUNGS: clear to auscultation and percussion with normal breathing effort HEART: regular rate & rhythm and no murmurs and no lower extremity edema ABDOMEN:abdomen soft, non-tender and normal bowel sounds Musculoskeletal:no cyanosis of digits and no clubbing  NEURO: alert & oriented x 3 with fluent speech, no focal motor/sensory deficits BREAST: S/Snyder b/l mastectomy and reconstruction: Surgical incision healing well. (+) Skin erythema of right breast, no skin breakdown from RT. No palpable mass, nodules or  adenopathy bilaterally. Breast exam benign.   LABORATORY DATA:  I have reviewed the data as listed CBC Latest Ref Rng & Units 09/20/2019 06/03/2019 05/13/2019  WBC 4.0 - 10.5 K/uL 6.0 8.9 18.1(H)  Hemoglobin 12.0 - 15.0 g/dL 12.0 10.4(L) 10.3(L)  Hematocrit 36.0 - 46.0 % 36.4 32.5(L) 31.8(L)  Platelets 150 - 400 K/uL 240 339 367     CMP Latest Ref Rng & Units 09/20/2019 06/03/2019 05/13/2019  Glucose 70 - 99 mg/dL 70 96 97  BUN 6 - 20 mg/dL '12 14 14  ' Creatinine 0.44 - 1.00 mg/dL 0.69 0.72 0.72  Sodium 135 - 145 mmol/L 142 140 138  Potassium 3.5 - 5.1 mmol/L 3.7 3.3(L) 3.8  Chloride 98 - 111 mmol/L 106 105 102  CO2 22 - 32 mmol/L '26 26 26  ' Calcium 8.9 - 10.3 mg/dL 9.8 10.1 10.1  Total Protein 6.5 - 8.1 g/dL 6.9 7.4 7.6  Total Bilirubin 0.3 - 1.2 mg/dL 0.3 0.4 0.3  Alkaline Phos 38 - 126 U/L 73 70 96  AST 15 - 41 U/L 16 14(L) 16  ALT 0 - 44 U/L '14 14 24      ' RADIOGRAPHIC STUDIES: I have personally reviewed the radiological images as listed and agreed with the findings in the report. No results found.   ASSESSMENT & PLAN:  [name] is a 53 y.o. female with   1.Malignant neoplasm of upper-outer quadrant of right breast,invasive lobular carcinoma, stageIA,Snyder(T1a,N1a,M0), ER10%+, PR-, HER2-Grade I -She was diagnosed in 12/2018. She is s/Snyder b/l mastectomyon3/11/20.  -Idiscussedthe pathology results with her in great detail which shows her small tumor was removed by biopsy alone and there were no cancer cells found inher mastectomy sample. Her margins were negative, however she has 1/5 positive lymph nodes.  -She completed adjuvant chemo with TC every 3 weeks for 4 cycles.  -She underwent breast reconstruction with implant placements on 06/30/19 -I started her antiestrogen therapy with Anastrozole in 06/2019.  She tolerated poorly with significant hot flash, and joint stiffness.  She stopped after 2 weeks -She started Adjuvant Radiation with Dr [name] on 08/17/19 and plans  to complete on 09/28/19. Tolerating moderately well.  -Due to her high risk disease, and the ER positive, I recommend adjuvant antiestrogen therapy.  She previously did not tolerate anastrozole, I recommend  switching to Exemestane, potential side effects reviewed with her again.  She agrees to try. -I also discussed given her cancer is PR negative and low ER presentation her benefit from antiestrogen therapy is not very high. We can stop if not tolerable. She understands.  -Labs reviewed, CBC and CMP WNL. CA 27.29 still pending. Continue Radiation.  -Plan to start Exemestane next month.  -We also discussed the breast cancer surveillance. She will continue self exam, and a routine office visit with lab and exam with [place]. -She will proceed with survivorship in 3 months and then f/u with me in 6 months.  -Flu shot this week.    2. Genetic Testing  -She does note some family history for malignancy so I will see if her insurance will cover testing as she is interested.   3. Social support  -She takes care of her daughter full time who is on total care and ventilator  -she has a Marine scientist for 5 days of total care for her daughter.  -She has good family support  4. Right knee pain, osteoarthritis  -Musculoskeletal/arthritic pain  -She has Baclofen, flexeril and Tylenolas neededfor arthritis.  -Improved lately, stable  -She was also previously given Gabapentin for her neuropathy of hands and lower arm.   5. Hot flashes -hot flush got worse after chemo.  -She previously switched Prozac to Effexor. Her hot flashes have much improved. But worsened with antiestrogen therapy.  -I recommend she increase Effexor to 169m daily (09/20/19) to better control her hot flashes when she starts exemestane. She is agreeable.   6. Bone Health  -I discussed AI can weaken her bone.  -Will obtain baseline DEXA in near future   PLAN: -I called in Exemestane today. She will start in 3-4 weeks -She can  increase Effexor to 1544mdue to anticipated hot flush from AI  -Continue Radiation -Survivorship clinic in 3 months -Lab and f/u in 6 months     No problem-specific Assessment & Plan notes found for this encounter.   No orders of the defined types were placed in this encounter.  All questions were answered. The patient knows to call the clinic with any problems, questions or concerns. No barriers to learning was detected. I spent 20 minutes counseling the patient face to face. The total time spent in the appointment was 25 minutes and more than 50% was on counseling and review of test results     YaTruitt MerleMD 09/20/2019   I, AmJoslyn Devonam acting as scribe for YaTruitt MerleMD.   I have reviewed the above documentation for accuracy and completeness, and I agree with the above.

## 2019-09-17 ENCOUNTER — Ambulatory Visit
Admission: RE | Admit: 2019-09-17 | Discharge: 2019-09-17 | Disposition: A | Discharge status: Home / Self Care | Payer: BC Managed Care – PPO | Source: Ambulatory Visit | Attending: Radiation Oncology | Admitting: Radiation Oncology

## 2019-09-17 ENCOUNTER — Ambulatory Visit: Payer: BC Managed Care – PPO

## 2019-09-17 ENCOUNTER — Other Ambulatory Visit: Payer: Self-pay

## 2019-09-17 DIAGNOSIS — C50411 Malignant neoplasm of upper-outer quadrant of right female breast: Secondary | ICD-10-CM | POA: Diagnosis not present

## 2019-09-20 ENCOUNTER — Encounter: Payer: Self-pay | Admitting: Hematology

## 2019-09-20 ENCOUNTER — Inpatient Hospital Stay: Payer: BC Managed Care – PPO | Attending: Hematology

## 2019-09-20 ENCOUNTER — Other Ambulatory Visit: Payer: Self-pay

## 2019-09-20 ENCOUNTER — Inpatient Hospital Stay: Payer: BC Managed Care – PPO | Admitting: Hematology

## 2019-09-20 ENCOUNTER — Ambulatory Visit
Admission: RE | Admit: 2019-09-20 | Discharge: 2019-09-20 | Disposition: A | Discharge status: Home / Self Care | Payer: BC Managed Care – PPO | Source: Ambulatory Visit | Attending: Radiation Oncology | Admitting: Radiation Oncology

## 2019-09-20 VITALS — BP 117/73 | HR 87 | Temp 98.7°F | Resp 18 | Ht 63.0 in | Wt 180.1 lb

## 2019-09-20 DIAGNOSIS — M1990 Unspecified osteoarthritis, unspecified site: Secondary | ICD-10-CM | POA: Insufficient documentation

## 2019-09-20 DIAGNOSIS — R232 Flushing: Secondary | ICD-10-CM | POA: Insufficient documentation

## 2019-09-20 DIAGNOSIS — Z79811 Long term (current) use of aromatase inhibitors: Secondary | ICD-10-CM | POA: Diagnosis not present

## 2019-09-20 DIAGNOSIS — F419 Anxiety disorder, unspecified: Secondary | ICD-10-CM | POA: Insufficient documentation

## 2019-09-20 DIAGNOSIS — Z170 Estrogen receptor positive status [ER+]: Secondary | ICD-10-CM

## 2019-09-20 DIAGNOSIS — K219 Gastro-esophageal reflux disease without esophagitis: Secondary | ICD-10-CM | POA: Insufficient documentation

## 2019-09-20 DIAGNOSIS — Z79899 Other long term (current) drug therapy: Secondary | ICD-10-CM | POA: Diagnosis not present

## 2019-09-20 DIAGNOSIS — C50411 Malignant neoplasm of upper-outer quadrant of right female breast: Secondary | ICD-10-CM | POA: Insufficient documentation

## 2019-09-20 DIAGNOSIS — Z9221 Personal history of antineoplastic chemotherapy: Secondary | ICD-10-CM | POA: Diagnosis not present

## 2019-09-20 DIAGNOSIS — Z923 Personal history of irradiation: Secondary | ICD-10-CM | POA: Insufficient documentation

## 2019-09-20 DIAGNOSIS — M2550 Pain in unspecified joint: Secondary | ICD-10-CM | POA: Insufficient documentation

## 2019-09-20 DIAGNOSIS — Z23 Encounter for immunization: Secondary | ICD-10-CM | POA: Insufficient documentation

## 2019-09-20 DIAGNOSIS — M25561 Pain in right knee: Secondary | ICD-10-CM | POA: Insufficient documentation

## 2019-09-20 LAB — CBC WITH DIFFERENTIAL (CANCER CENTER ONLY)
Abs Immature Granulocytes: 0.03 10*3/uL (ref 0.00–0.07)
Basophils Absolute: 0.1 10*3/uL (ref 0.0–0.1)
Basophils Relative: 1 %
Eosinophils Absolute: 0.7 10*3/uL — ABNORMAL HIGH (ref 0.0–0.5)
Eosinophils Relative: 11 %
HCT: 36.4 % (ref 36.0–46.0)
Hemoglobin: 12 g/dL (ref 12.0–15.0)
Immature Granulocytes: 1 %
Lymphocytes Relative: 11 %
Lymphs Abs: 0.7 10*3/uL (ref 0.7–4.0)
MCH: 29 pg (ref 26.0–34.0)
MCHC: 33 g/dL (ref 30.0–36.0)
MCV: 87.9 fL (ref 80.0–100.0)
Monocytes Absolute: 0.7 10*3/uL (ref 0.1–1.0)
Monocytes Relative: 11 %
Neutro Abs: 3.9 10*3/uL (ref 1.7–7.7)
Neutrophils Relative %: 65 %
Platelet Count: 240 10*3/uL (ref 150–400)
RBC: 4.14 MIL/uL (ref 3.87–5.11)
RDW: 13.1 % (ref 11.5–15.5)
WBC Count: 6 10*3/uL (ref 4.0–10.5)
nRBC: 0 % (ref 0.0–0.2)

## 2019-09-20 LAB — CMP (CANCER CENTER ONLY)
ALT: 14 U/L (ref 0–44)
AST: 16 U/L (ref 15–41)
Albumin: 3.8 g/dL (ref 3.5–5.0)
Alkaline Phosphatase: 73 U/L (ref 38–126)
Anion gap: 10 (ref 5–15)
BUN: 12 mg/dL (ref 6–20)
CO2: 26 mmol/L (ref 22–32)
Calcium: 9.8 mg/dL (ref 8.9–10.3)
Chloride: 106 mmol/L (ref 98–111)
Creatinine: 0.69 mg/dL (ref 0.44–1.00)
GFR, Est AFR Am: >60 mL/min (ref >60)
GFR, Estimated: >60 mL/min (ref >60)
Glucose, Bld: 70 mg/dL (ref 70–99)
Potassium: 3.7 mmol/L (ref 3.5–5.1)
Sodium: 142 mmol/L (ref 135–145)
Total Bilirubin: 0.3 mg/dL (ref 0.3–1.2)
Total Protein: 6.9 g/dL (ref 6.5–8.1)

## 2019-09-20 MED ORDER — EXEMESTANE 25 MG PO TABS: 25.0000 mg | ORAL_TABLET | Freq: Every day | ORAL | 5 refills | Status: DC

## 2019-09-21 ENCOUNTER — Telehealth: Payer: Self-pay | Admitting: Hematology

## 2019-09-21 ENCOUNTER — Ambulatory Visit: Payer: BC Managed Care – PPO

## 2019-09-21 ENCOUNTER — Other Ambulatory Visit: Payer: Self-pay

## 2019-09-21 ENCOUNTER — Ambulatory Visit
Admission: RE | Admit: 2019-09-21 | Discharge: 2019-09-21 | Disposition: A | Discharge status: Home / Self Care | Payer: BC Managed Care – PPO | Source: Ambulatory Visit | Attending: Radiation Oncology | Admitting: Radiation Oncology

## 2019-09-21 DIAGNOSIS — C50411 Malignant neoplasm of upper-outer quadrant of right female breast: Secondary | ICD-10-CM

## 2019-09-21 LAB — CANCER ANTIGEN 27.29: CA 27.29: 25.1 U/mL (ref 0.0–38.6)

## 2019-09-21 NOTE — Telephone Encounter (Signed)
Scheduled appt per 10/5 los.  Spoke with pt and she is aware of the appt dates and time.

## 2019-09-21 NOTE — Progress Notes (Signed)
.  Simulation verification  The patient was brought to the treatment machine and placed in the plan treatment position.  Clinical set up was verified to ensure that the target region is appropriately covered for the patient's upcoming electron boost treatment.  The targeted volume of tissue is appropriately covered by the radiation field.  Based on my personal review, I approve the simulation verification.  The patient's treatment will proceed as planned.  ------------------------------------------------  -----------------------------------  Tiffany Bryars D. Lilianne Delair, PhD, MD  

## 2019-09-22 ENCOUNTER — Other Ambulatory Visit: Payer: Self-pay

## 2019-09-22 ENCOUNTER — Ambulatory Visit
Admission: RE | Admit: 2019-09-22 | Discharge: 2019-09-22 | Disposition: A | Discharge status: Home / Self Care | Payer: BC Managed Care – PPO | Source: Ambulatory Visit | Attending: Radiation Oncology | Admitting: Radiation Oncology

## 2019-09-22 DIAGNOSIS — C50411 Malignant neoplasm of upper-outer quadrant of right female breast: Secondary | ICD-10-CM | POA: Diagnosis not present

## 2019-09-23 ENCOUNTER — Ambulatory Visit
Admission: RE | Admit: 2019-09-23 | Discharge: 2019-09-23 | Disposition: A | Discharge status: Home / Self Care | Payer: BC Managed Care – PPO | Source: Ambulatory Visit | Attending: Radiation Oncology | Admitting: Radiation Oncology

## 2019-09-23 DIAGNOSIS — C50411 Malignant neoplasm of upper-outer quadrant of right female breast: Secondary | ICD-10-CM | POA: Diagnosis not present

## 2019-09-24 ENCOUNTER — Ambulatory Visit
Admission: RE | Admit: 2019-09-24 | Discharge: 2019-09-24 | Disposition: A | Discharge status: Home / Self Care | Payer: BC Managed Care – PPO | Source: Ambulatory Visit | Attending: Radiation Oncology | Admitting: Radiation Oncology

## 2019-09-24 ENCOUNTER — Other Ambulatory Visit: Payer: Self-pay

## 2019-09-24 DIAGNOSIS — C50411 Malignant neoplasm of upper-outer quadrant of right female breast: Secondary | ICD-10-CM | POA: Diagnosis not present

## 2019-09-27 ENCOUNTER — Encounter: Payer: Self-pay | Admitting: *Deleted

## 2019-09-27 ENCOUNTER — Ambulatory Visit
Admission: RE | Admit: 2019-09-27 | Discharge: 2019-09-27 | Disposition: A | Discharge status: Home / Self Care | Payer: BC Managed Care – PPO | Source: Ambulatory Visit | Attending: Radiation Oncology | Admitting: Radiation Oncology

## 2019-09-27 ENCOUNTER — Other Ambulatory Visit: Payer: Self-pay

## 2019-09-27 DIAGNOSIS — C50411 Malignant neoplasm of upper-outer quadrant of right female breast: Secondary | ICD-10-CM | POA: Diagnosis not present

## 2019-09-28 ENCOUNTER — Encounter: Payer: Self-pay | Admitting: Radiation Oncology

## 2019-09-28 ENCOUNTER — Other Ambulatory Visit: Payer: Self-pay

## 2019-09-28 ENCOUNTER — Other Ambulatory Visit: Payer: Self-pay | Admitting: Radiation Oncology

## 2019-09-28 ENCOUNTER — Encounter: Payer: Self-pay | Admitting: *Deleted

## 2019-09-28 ENCOUNTER — Inpatient Hospital Stay: Payer: BC Managed Care – PPO

## 2019-09-28 ENCOUNTER — Ambulatory Visit
Admission: RE | Admit: 2019-09-28 | Discharge: 2019-09-28 | Disposition: A | Discharge status: Home / Self Care | Payer: BC Managed Care – PPO | Source: Ambulatory Visit | Attending: Radiation Oncology | Admitting: Radiation Oncology

## 2019-09-28 VITALS — BP 120/80 | HR 92 | Temp 98.3°F | Resp 18

## 2019-09-28 DIAGNOSIS — C50411 Malignant neoplasm of upper-outer quadrant of right female breast: Secondary | ICD-10-CM | POA: Diagnosis not present

## 2019-09-28 DIAGNOSIS — Z23 Encounter for immunization: Secondary | ICD-10-CM

## 2019-09-28 MED ORDER — ZOLPIDEM TARTRATE 5 MG PO TABS: 5.0000 mg | ORAL_TABLET | Freq: Every evening | ORAL | 1 refills | Status: DC | PRN

## 2019-09-28 MED ORDER — INFLUENZA VAC SPLIT QUAD 0.5 ML IM SUSY
0.5000 mL | PREFILLED_SYRINGE | Freq: Once | INTRAMUSCULAR | Status: AC
  Administered 2019-09-28: 13:00:00 0.5 mL via INTRAMUSCULAR

## 2019-09-28 NOTE — Patient Instructions (Signed)
Influenza Virus Vaccine injection What is this medicine? INFLUENZA VIRUS VACCINE (in floo EN zuh VAHY ruhs vak SEEN) helps to reduce the risk of getting influenza also known as the flu. The vaccine only helps protect you against some strains of the flu. This medicine may be used for other purposes; ask your health care provider or pharmacist if you have questions. COMMON BRAND NAME(S): Afluria, Afluria Quadrivalent, Agriflu, Alfuria, FLUAD, Fluarix, Fluarix Quadrivalent, Flublok, Flublok Quadrivalent, FLUCELVAX, Flulaval, Fluvirin, Fluzone, Fluzone High-Dose, Fluzone Intradermal What should I tell my health care provider before I take this medicine? They need to know if you have any of these conditions:  bleeding disorder like hemophilia  fever or infection  Guillain-Barre syndrome or other neurological problems  immune system problems  infection with the human immunodeficiency virus (HIV) or AIDS  low blood platelet counts  multiple sclerosis  an unusual or allergic reaction to influenza virus vaccine, latex, other medicines, foods, dyes, or preservatives. Different brands of vaccines contain different allergens. Some may contain latex or eggs. Talk to your doctor about your allergies to make sure that you get the right vaccine.  pregnant or trying to get pregnant  breast-feeding How should I use this medicine? This vaccine is for injection into a muscle or under the skin. It is given by a health care professional. A copy of Vaccine Information Statements will be given before each vaccination. Read this sheet carefully each time. The sheet may change frequently. Talk to your healthcare provider to see which vaccines are right for you. Some vaccines should not be used in all age groups. Overdosage: If you think you have taken too much of this medicine contact a poison control center or emergency room at once. NOTE: This medicine is only for you. Do not share this medicine with  others. What if I miss a dose? This does not apply. What may interact with this medicine?  chemotherapy or radiation therapy  medicines that lower your immune system like etanercept, anakinra, infliximab, and adalimumab  medicines that treat or prevent blood clots like warfarin  phenytoin  steroid medicines like prednisone or cortisone  theophylline  vaccines This list may not describe all possible interactions. Give your health care provider a list of all the medicines, herbs, non-prescription drugs, or dietary supplements you use. Also tell them if you smoke, drink alcohol, or use illegal drugs. Some items may interact with your medicine. What should I watch for while using this medicine? Report any side effects that do not go away within 3 days to your doctor or health care professional. Call your health care provider if any unusual symptoms occur within 6 weeks of receiving this vaccine. You may still catch the flu, but the illness is not usually as bad. You cannot get the flu from the vaccine. The vaccine will not protect against colds or other illnesses that may cause fever. The vaccine is needed every year. What side effects may I notice from receiving this medicine? Side effects that you should report to your doctor or health care professional as soon as possible:  allergic reactions like skin rash, itching or hives, swelling of the face, lips, or tongue Side effects that usually do not require medical attention (report to your doctor or health care professional if they continue or are bothersome):  fever  headache  muscle aches and pains  pain, tenderness, redness, or swelling at the injection site  tiredness This list may not describe all possible side effects. Call your   doctor for medical advice about side effects. You may report side effects to FDA at 1-800-FDA-1088. Where should I keep my medicine? The vaccine will be given by a health care professional in a  clinic, pharmacy, doctor's office, or other health care setting. You will not be given vaccine doses to store at home. NOTE: This sheet is a summary. It may not cover all possible information. If you have questions about this medicine, talk to your doctor, pharmacist, or health care provider.  2020 Elsevier/Gold Standard (2018-10-27 08:45:43)  

## 2019-10-04 ENCOUNTER — Telehealth: Payer: Self-pay

## 2019-10-04 NOTE — Telephone Encounter (Signed)

## 2019-10-05 ENCOUNTER — Other Ambulatory Visit: Payer: Self-pay

## 2019-10-05 ENCOUNTER — Encounter: Payer: Self-pay | Admitting: Surgical

## 2019-10-05 ENCOUNTER — Ambulatory Visit (INDEPENDENT_AMBULATORY_CARE_PROVIDER_SITE_OTHER): Payer: BC Managed Care – PPO | Admitting: Surgical

## 2019-10-05 VITALS — BP 108/74 | HR 83 | Temp 97.7°F | Ht 63.0 in | Wt 176.8 lb

## 2019-10-05 DIAGNOSIS — Z9013 Acquired absence of bilateral breasts and nipples: Secondary | ICD-10-CM

## 2019-10-05 DIAGNOSIS — Z9889 Other specified postprocedural states: Secondary | ICD-10-CM

## 2019-10-05 DIAGNOSIS — Z170 Estrogen receptor positive status [ER+]: Secondary | ICD-10-CM

## 2019-10-05 DIAGNOSIS — C50411 Malignant neoplasm of upper-outer quadrant of right female breast: Secondary | ICD-10-CM

## 2019-10-05 MED ORDER — CYCLOBENZAPRINE HCL 10 MG PO TABS: 10.0000 mg | ORAL_TABLET | Freq: Two times a day (BID) | ORAL | 0 refills | Status: DC | PRN

## 2019-10-05 NOTE — Progress Notes (Signed)
   Subjective:     Patient ID: [name], female    DOB: 08/19/1966, 53 y.o.   MRN: [id number]  Chief Complaint  Patient presents with  . Follow-up    1 month on breast reconstruction    HPI: The patient is a 53 y.o. female here for follow-up on her breast reconstruction.  She has silicone implants, bilaterally.  She finished radiation 09/28/19. Her right breast has significant radiation burn and peeling skin. Her incisions are healed really nicely and are all c/d/i. She has some pressure/tenderness medially, but it is improving. She reports the flexeril has been helpful.  No fever, chills, n/v. Feeling well other than pain/burn from radiation.  Review of Systems  Constitutional: Positive for activity change. Negative for appetite change, chills, fatigue and fever.  Respiratory: Negative for cough, shortness of breath and wheezing.   Cardiovascular: Negative for chest pain and palpitations.  Gastrointestinal: Negative for abdominal pain, constipation, diarrhea and nausea.  Musculoskeletal: Positive for myalgias. Negative for back pain and neck pain.  Skin: Positive for color change and rash (radiation). Negative for pallor and wound.     Objective:   Vital Signs BP 108/74 (BP Location: Left Arm, Patient Position: Sitting, Cuff Size: Large)   Pulse 83   Temp 97.7 F (36.5 C) (Temporal)   Ht 5\' 3"  (1.6 m)   Wt 176 lb 12.8 oz (80.2 kg)   SpO2 98%   BMI 31.32 kg/m  Vital Signs and Nursing Note Reviewed  Physical Exam  Constitutional: She is oriented to person, place, and time and well-developed, well-nourished, and in no distress.  HENT:  Head: Normocephalic and atraumatic.  Cardiovascular: Normal rate.  Pulmonary/Chest: Effort normal.    Musculoskeletal: Normal range of motion.  Neurological: She is alert and oriented to person, place, and time. Gait normal.  Skin: Skin is warm and dry. Rash noted. She is not diaphoretic. There is erythema. No pallor.   Psychiatric: Mood and affect normal.      Assessment/Plan:     ICD-10-CM   1. S/P breast reconstruction  Z98.890   2. Acquired absence of bilateral breasts and nipples  Z90.13   3. Malignant neoplasm of upper-outer quadrant of right breast in female, estrogen receptor positive ([place])  C50.411    Z17.0    Mrs. Gumina is doing well. She still is having some tightness and pressure on the right side. No sign of infection, seroma, hematoma at this time. Incisions are all c/d/i. It is possible she develops wounds on the right breast s/p radiation, but at this time, she is doing great.  She can use mederma cream on the left breast.  Follow up in 3 months. She is interested in [place] tattoo.    Carola Rhine Jonty Morrical, PA-C 10/05/2019, 11:53 AM

## 2019-10-20 ENCOUNTER — Other Ambulatory Visit: Payer: Self-pay | Admitting: Hematology

## 2019-10-25 ENCOUNTER — Telehealth: Payer: Self-pay | Admitting: *Deleted

## 2019-10-25 NOTE — Telephone Encounter (Signed)
RETURNED PATIENT'S CALL AND ALTERED FU APPT. ON 11-01-19 PER PATIENT REQUEST DUE TO HER BEING OUT OF TOWN, RESCHEDULED FOR 11-18-19 @ 8:30 AM, PATIENT VERIFIED UNDERSTANDING THIS AND IS IN AGREEMENT

## 2019-10-25 NOTE — Progress Notes (Incomplete)
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 She reported moderate fatigue and pain to the treatment field. She experienced a raised rash along the right chest and supraclavicular. Dry desquamation and small areas of moist desquamation were noted towards the end of treatment.  Plan: The patient will follow-up with radiation oncology in 1 month.  ________________________________________________   Blair Promise, PhD, MD  This document serves as a record of services personally performed by Gery Pray, MD. It was created on his behalf by Wilburn Mylar, a trained medical scribe. The creation of this record is based on the scribe's personal observations and the provider's statements to them. This document has been checked and approved by the attending provider.

## 2019-11-01 ENCOUNTER — Ambulatory Visit: Payer: BC Managed Care – PPO | Admitting: Radiation Oncology

## 2019-11-18 ENCOUNTER — Ambulatory Visit: Payer: BC Managed Care – PPO | Admitting: Radiation Oncology

## 2019-12-07 ENCOUNTER — Other Ambulatory Visit: Payer: Self-pay | Admitting: Medical

## 2019-12-07 ENCOUNTER — Encounter: Payer: Self-pay | Admitting: *Deleted

## 2019-12-13 ENCOUNTER — Other Ambulatory Visit: Payer: Self-pay | Admitting: Surgical

## 2019-12-14 ENCOUNTER — Telehealth: Payer: Self-pay | Admitting: *Deleted

## 2019-12-14 ENCOUNTER — Other Ambulatory Visit: Payer: Self-pay | Admitting: Hematology

## 2019-12-14 MED ORDER — GABAPENTIN 300 MG PO CAPS: 300.0000 mg | ORAL_CAPSULE | Freq: Two times a day (BID) | ORAL | 0 refills | Status: DC

## 2019-12-14 NOTE — Telephone Encounter (Signed)
[name] states she has been hurting in all joints X 2 weeks. Reports when this happened before she saw Sandi Mealy, PA and he prescribed gabapentin and it relieved the pain. Is requesting a refill.

## 2019-12-14 NOTE — Telephone Encounter (Signed)
Notified of message below

## 2019-12-14 NOTE — Telephone Encounter (Signed)
I refilled gabapentin, she has appointment with Lacie next week. I am wondering if her arthralgia is related Exemestane which he started last month. Lacie can evaluate her next week. Thanks   Truitt Merle MD

## 2019-12-14 NOTE — Telephone Encounter (Signed)
Yes I'll be happy to talk to her about this.  Thanks, Regan Rakers

## 2019-12-18 ENCOUNTER — Encounter (HOSPITAL_COMMUNITY): Payer: Self-pay

## 2019-12-18 ENCOUNTER — Emergency Department (HOSPITAL_COMMUNITY): Payer: BC Managed Care – PPO

## 2019-12-18 ENCOUNTER — Emergency Department (HOSPITAL_COMMUNITY)
Admission: EM | Admit: 2019-12-18 | Discharge: 2019-12-18 | Disposition: A | Discharge status: Home / Self Care | Payer: BC Managed Care – PPO | Attending: Emergency Medicine | Admitting: Emergency Medicine

## 2019-12-18 ENCOUNTER — Other Ambulatory Visit: Payer: Self-pay

## 2019-12-18 DIAGNOSIS — Z853 Personal history of malignant neoplasm of breast: Secondary | ICD-10-CM | POA: Insufficient documentation

## 2019-12-18 DIAGNOSIS — R202 Paresthesia of skin: Secondary | ICD-10-CM | POA: Diagnosis not present

## 2019-12-18 DIAGNOSIS — M79621 Pain in right upper arm: Secondary | ICD-10-CM | POA: Diagnosis not present

## 2019-12-18 DIAGNOSIS — R079 Chest pain, unspecified: Secondary | ICD-10-CM | POA: Diagnosis present

## 2019-12-18 DIAGNOSIS — R519 Headache, unspecified: Secondary | ICD-10-CM | POA: Insufficient documentation

## 2019-12-18 DIAGNOSIS — Z79899 Other long term (current) drug therapy: Secondary | ICD-10-CM | POA: Insufficient documentation

## 2019-12-18 DIAGNOSIS — R0789 Other chest pain: Secondary | ICD-10-CM

## 2019-12-18 LAB — CBC WITH DIFFERENTIAL/PLATELET
Abs Immature Granulocytes: 0.02 10*3/uL (ref 0.00–0.07)
Basophils Absolute: 0.1 10*3/uL (ref 0.0–0.1)
Basophils Relative: 2 %
Eosinophils Absolute: 0.3 10*3/uL (ref 0.0–0.5)
Eosinophils Relative: 5 %
HCT: 37.4 % (ref 36.0–46.0)
Hemoglobin: 12 g/dL (ref 12.0–15.0)
Immature Granulocytes: 0 %
Lymphocytes Relative: 20 %
Lymphs Abs: 1.1 10*3/uL (ref 0.7–4.0)
MCH: 29.4 pg (ref 26.0–34.0)
MCHC: 32.1 g/dL (ref 30.0–36.0)
MCV: 91.7 fL (ref 80.0–100.0)
Monocytes Absolute: 0.5 10*3/uL (ref 0.1–1.0)
Monocytes Relative: 10 %
Neutro Abs: 3.3 10*3/uL (ref 1.7–7.7)
Neutrophils Relative %: 63 %
Platelets: 291 10*3/uL (ref 150–400)
RBC: 4.08 MIL/uL (ref 3.87–5.11)
RDW: 12.7 % (ref 11.5–15.5)
WBC: 5.3 10*3/uL (ref 4.0–10.5)
nRBC: 0 % (ref 0.0–0.2)

## 2019-12-18 LAB — COMPREHENSIVE METABOLIC PANEL
ALT: 23 U/L (ref 0–44)
AST: 22 U/L (ref 15–41)
Albumin: 4 g/dL (ref 3.5–5.0)
Alkaline Phosphatase: 78 U/L (ref 38–126)
Anion gap: 9 (ref 5–15)
BUN: 11 mg/dL (ref 6–20)
CO2: 28 mmol/L (ref 22–32)
Calcium: 9.6 mg/dL (ref 8.9–10.3)
Chloride: 104 mmol/L (ref 98–111)
Creatinine, Ser: 0.7 mg/dL (ref 0.44–1.00)
GFR calc Af Amer: >60 mL/min (ref >60)
GFR calc non Af Amer: >60 mL/min (ref >60)
Glucose, Bld: 88 mg/dL (ref 70–99)
Potassium: 3.6 mmol/L (ref 3.5–5.1)
Sodium: 141 mmol/L (ref 135–145)
Total Bilirubin: 0.4 mg/dL (ref 0.3–1.2)
Total Protein: 7.4 g/dL (ref 6.5–8.1)

## 2019-12-18 LAB — CK: Total CK: 95 U/L (ref 38–234)

## 2019-12-18 LAB — VITAMIN B12: Vitamin B-12: 1653 pg/mL — ABNORMAL HIGH (ref 180–914)

## 2019-12-18 LAB — FOLATE: Folate: 32.5 ng/mL (ref >5.9)

## 2019-12-18 MED ORDER — SODIUM CHLORIDE 0.9 % IV BOLUS
1000.0000 mL | Freq: Once | INTRAVENOUS | Status: AC
  Administered 2019-12-18: 1000 mL via INTRAVENOUS

## 2019-12-18 MED ORDER — PROCHLORPERAZINE EDISYLATE 10 MG/2ML IJ SOLN
10.0000 mg | Freq: Once | INTRAMUSCULAR | Status: AC
  Administered 2019-12-18: 10 mg via INTRAVENOUS
  Filled 2019-12-18: qty 2

## 2019-12-18 MED ORDER — HYDROMORPHONE HCL 1 MG/ML IJ SOLN
0.5000 mg | Freq: Once | INTRAMUSCULAR | Status: AC
  Administered 2019-12-18: 0.5 mg via INTRAVENOUS
  Filled 2019-12-18: qty 1

## 2019-12-18 MED ORDER — DIPHENHYDRAMINE HCL 50 MG/ML IJ SOLN
25.0000 mg | Freq: Once | INTRAMUSCULAR | Status: AC
  Administered 2019-12-18: 25 mg via INTRAVENOUS
  Filled 2019-12-18: qty 1

## 2019-12-18 NOTE — Discharge Instructions (Addendum)
You can start taking your gabapentin 300 mg 3 times daily.  Do not abruptly stop this medication as doing so can cause seizures.  If you are going to stop this medication I would recommend slowly tapering down by decreasing by 1 tablet per day for 2 days each.   You can continue taking Flexeril for muscle relaxation.  You can also apply topical lidocaine patches which you can purchase over-the-counter to areas of pain.   Follow-up with neurology or your PCP on an outpatient basis for reevaluation of symptoms.  Return to the emergency department if any concerning signs or symptoms develop such as high fevers, neck stiffness, persistent vomiting, or loss of consciousness.

## 2019-12-18 NOTE — ED Provider Notes (Signed)
  Face-to-face evaluation   History: She presents for evaluation of 2 weeks of persistent numbness and tingling, arms and legs.  She also complains of "pain in my knuckles."  She is mostly bothered by pain in her right upper arm.  She had some similar symptoms in past when she was receiving radiation for breast cancer, several months ago.  No prior history of systemic arthritis.  No fever.  She has not had COVID-19.  Physical exam: Alert, anxious, uncomfortable.  No respiratory distress.  She is moving arms and legs normally.  She is lucid.  Medical screening examination/treatment/procedure(s) were conducted as a shared visit with non-physician practitioner(s) and myself.  I personally evaluated the patient during the encounter    Daleen Bo, MD 12/19/19 1230

## 2019-12-18 NOTE — ED Provider Notes (Signed)
I assumed care of patient from previous team at shift change, please see their note for full H&P.  Briefly patient is here for evaluation of diffuse body wide pins-and-needles worse in the right upper extremity along with a headache.    Physical Exam  BP (!) 107/58 (BP Location: Right Arm)   Pulse 88   Temp 98.2 F (36.8 C)   Resp 16   SpO2 96%   Physical Exam Vitals and nursing note reviewed.  Constitutional:      General: She is not in acute distress.    Appearance: Normal appearance. She is not ill-appearing.  HENT:     Head: Normocephalic.  Neurological:     Mental Status: She is alert.     ED Course/Procedures     Procedures  No results found. CLINICAL DATA: Headache with upper extremity numbness  EXAM: CT HEAD WITHOUT CONTRAST  CT CERVICAL SPINE WITHOUT CONTRAST  TECHNIQUE: Multidetector CT imaging of the head and cervical spine was performed following the standard protocol without intravenous contrast. Multiplanar CT image reconstructions of the cervical spine were also generated.  COMPARISON: CT brain 11/05/2014  FINDINGS: CT HEAD FINDINGS  Brain: No evidence of acute infarction, hemorrhage, hydrocephalus, extra-axial collection or mass lesion/mass effect.  Vascular: No hyperdense vessel or unexpected calcification.  Skull: Normal. Negative for fracture or focal lesion.  Sinuses/Orbits: No acute finding.  Other: None  CT CERVICAL SPINE FINDINGS  Alignment: Normal.  Skull base and vertebrae: No acute fracture. No primary bone lesion or focal pathologic process.  Soft tissues and spinal canal: No prevertebral fluid or swelling. No visible canal hematoma.  Disc levels: Mild degenerative changes C5-C6.  Upper chest: Negative.  Other: None  IMPRESSION: 1. Negative non contrasted CT appearance of the brain 2. No CT evidence for acute osseous abnormality   Electronically Signed By: Donavan Foil M.D. On: 12/18/2019 20:41  MDM  F/u on  labs, ct scan, is reportedly fully neurologically intact. Can increase gabapentin to TID.   CBC, CMP without significant hematologic or electrolyte derangements.  Vitamin B12 and folate are still pending at this time.  CT scan of head and neck without evidence of acute abnormalities or cause for patient's symptoms.  Recommended outpatient follow-up.  Return precautions were discussed with patient who states their understanding.  At the time of discharge patient denied any unaddressed complaints or concerns.  Patient is agreeable for discharge home.  Note: Portions of this report may have been transcribed using voice recognition software. Every effort was made to ensure accuracy; however, inadvertent computerized transcription errors may be present        Ollen Gross 12/19/19 Lysbeth Penner, MD 12/19/19 1230

## 2019-12-18 NOTE — ED Provider Notes (Signed)
[facility] DEPT Provider Note   CSN: [id number] Arrival date & time: 12/18/19  1808     History Chief Complaint  Patient presents with  . Breast Cancer  . Chest Pain    [name] is a 54 y.o. female with history of right breast cancer status post bilateral mastectomy currently on maintenance therapy, restless leg syndrome, anemia, anxiety, arthritis, GERD presents for evaluation of acute onset, persistent and progressively worsening paresthesias.  She reports that she feels a sharp stabbing pins-and-needles sensation "all over" but worse in the right upper extremity extending into the right side of the chest.  No aggravating or alleviating factors noted.  She also reports sharp stabbing frontal headaches which are different from her usual headaches, feel worse than usual.  No facial involvement. Denies vision changes, denies weakness of the extremities.  No recent injuries.  No fevers, nausea, vomiting, or abdominal pain.  No chest pain or shortness of breath.  She was recently prescribed pramipexole for restless leg syndrome which she states she took 1 tablet of but stopped because she felt as though it was making her symptoms worse.  She recently started taking gabapentin 300 mg twice daily again 4 days ago with little improvement.  The history is provided by the patient.       Past Medical History:  Diagnosis Date  . Anemia   . Anxiety   . Arthritis   . Cancer [facility])    right breast  . Family history of adverse reaction to anesthesia    sister has same issues  . GERD (gastroesophageal reflux disease)   . PONV (postoperative nausea and vomiting)     Patient Active Problem List   Diagnosis Date Noted  . S/P breast reconstruction 07/09/2019  . Right knee pain 06/22/2019  . Acquired absence of bilateral breasts and nipples 03/02/2019  . Breast cancer ([place]) 02/24/2019  . Malignant neoplasm of upper-outer quadrant of right breast in female,  estrogen receptor positive ([place]) 01/21/2019  . Iron deficiency anemia 11/23/2018  . Pain in right knee 03/18/2018  . Injury of right knee 03/18/2018  . GERD (gastroesophageal reflux disease) 05/21/2016    Past Surgical History:  Procedure Laterality Date  . BREAST RECONSTRUCTION WITH PLACEMENT OF TISSUE EXPANDER AND FLEX HD (ACELLULAR HYDRATED DERMIS) Bilateral 02/24/2019   Procedure: BREAST RECONSTRUCTION WITH PLACEMENT OF TISSUE EXPANDER AND FLEX HD (ACELLULAR HYDRATED DERMIS);  Surgeon: Wallace Going, DO;  Location: [facility];  Service: Plastics;  Laterality: Bilateral;  . BREAST SURGERY     left breast bx  . ESOPHAGOGASTRODUODENOSCOPY (EGD) WITH ESOPHAGEAL DILATION    . EYE SURGERY     dart to the eye  (right)  . FACIAL FRACTURE SURGERY    . FRACTURE SURGERY    . KNEE SURGERY    . MASTECTOMY W/ SENTINEL NODE BIOPSY Bilateral 02/24/2019   Procedure: RIGHT MASTECTOMY WITH SENTINEL LYMPH NODE MAPPING AND LEFT PROPHYLACTIC MASTECTOMY;  Surgeon: Jovita Kussmaul, MD;  Location: [facility];  Service: General;  Laterality: Bilateral;  . REMOVAL OF BILATERAL TISSUE EXPANDERS WITH PLACEMENT OF BILATERAL BREAST IMPLANTS Bilateral 06/30/2019   Procedure: REMOVAL OF BILATERAL TISSUE EXPANDERS WITH PLACEMENT OF BILATERAL BREAST IMPLANTS;  Surgeon: Wallace Going, DO;  Location: [facility];  Service: Plastics;  Laterality: Bilateral;  . SHOULDER SURGERY       OB History   No obstetric history on file.     Family History  Problem Relation Age of Onset  .  Brain cancer Father   . Cancer Maternal Uncle        melanoma   . Cancer Paternal Uncle        gastric cancer  . Spinal muscular atrophy Daughter   . Spinal muscular atrophy Child   . Breast cancer Neg Hx     Social History   Tobacco Use  . Smoking status: Never Smoker  . Smokeless tobacco: Never Used  Substance Use Topics  . Alcohol use: Yes    Comment: occasional  . Drug use: No    Home Medications Prior to  Admission medications   Medication Sig Start Date End Date Taking? Authorizing Provider  acetaminophen (TYLENOL) 500 MG tablet Take 1,000 mg by mouth every 6 (six) hours as needed for moderate pain.    [provider]  Cholecalciferol (VITAMIN D3) 125 MCG (5000 UT) CAPS Take 5,000 Units by mouth daily.    [provider]  Cyanocobalamin (B-12) 2500 MCG TABS Take 2,500 mcg by mouth daily.    [provider]  cyclobenzaprine (FLEXERIL) 10 MG tablet Take 1 tablet (10 mg total) by mouth 2 (two) times daily as needed for muscle spasms. 10/05/19   Scheeler, Carola Rhine, PA-C  Diclofenac Sodium CR 100 MG 24 hr tablet Take 1 tablet (100 mg total) by mouth daily. 03/05/19   Palumbo, April, MD  docusate sodium (COLACE) 100 MG capsule Take 100 mg by mouth daily.    [provider]  exemestane (AROMASIN) 25 MG tablet Take 1 tablet (25 mg total) by mouth daily after breakfast. 09/20/19   Truitt Merle, MD  gabapentin (NEURONTIN) 300 MG capsule Take 1 capsule (300 mg total) by mouth 2 (two) times daily. 12/14/19   Truitt Merle, MD  ibuprofen (ADVIL,MOTRIN) 200 MG tablet Take 600 mg by mouth every 6 (six) hours as needed for moderate pain.     [provider]  Iron-Vitamin C 65-125 MG TABS Take 1 tablet by mouth daily.    [provider]  LORazepam (ATIVAN) 0.5 MG tablet Take 1 tablet (0.5 mg total) by mouth every 8 (eight) hours as needed for anxiety. 07/30/19   Truitt Merle, MD  Multiple Vitamin (MULTIVITAMIN WITH MINERALS) TABS tablet Take 1 tablet by mouth daily.    [provider]  omeprazole (PRILOSEC) 20 MG capsule Take 20 mg by mouth daily.    [provider]  ondansetron (ZOFRAN) 4 MG tablet Take 1 tablet (4 mg total) by mouth every 8 (eight) hours as needed for nausea or vomiting. 06/22/19   Scheeler, Carola Rhine, PA-C  predniSONE (DELTASONE) 5 MG tablet 5 tab x 2 days, 4 tab x 2 days, 3 tab x 2 days, 2 tab x 2 days, 1 tab x 2 days, stop 07/30/19    Harle Stanford., PA-C  prochlorperazine (COMPAZINE) 10 MG tablet Take 1 tablet (10 mg total) by mouth every 6 (six) hours as needed (Nausea or vomiting). 03/18/19   Truitt Merle, MD  venlafaxine XR (EFFEXOR-XR) 75 MG 24 hr capsule TAKE 1 CAPSULE (75 MG TOTAL) BY MOUTH DAILY WITH BREAKFAST. 10/20/19   Truitt Merle, MD  zolpidem (AMBIEN) 5 MG tablet Take 1 tablet (5 mg total) by mouth at bedtime as needed for sleep. 09/28/19   Gery Pray, MD    Allergies    Codeine and Tramadol  Review of Systems   Review of Systems  Constitutional: Negative for chills and fever.  Eyes: Negative for photophobia and visual disturbance.  Respiratory: Negative for  shortness of breath.   Cardiovascular: Positive for chest pain (right chest wall).  Gastrointestinal: Negative for abdominal pain, nausea and vomiting.  Musculoskeletal: Positive for arthralgias and myalgias.  Neurological: Positive for numbness and headaches. Negative for syncope and weakness.  All other systems reviewed and are negative.   Physical Exam Updated Vital Signs BP 128/90   Pulse 87   Temp 98.2 F (36.8 C)   Resp (!) 23   SpO2 96%   Physical Exam Vitals and nursing note reviewed.  Constitutional:      General: She is not in acute distress.    Appearance: She is well-developed.  HENT:     Head: Normocephalic and atraumatic.  Eyes:     General:        Right eye: No discharge.        Left eye: No discharge.     Conjunctiva/sclera: Conjunctivae normal.  Neck:     Vascular: No JVD.     Trachea: No tracheal deviation.     Comments: No midline spine tenderness to palpation, right paracervical muscle tenderness in the trapezius distribution noted.  No deformity, crepitus, or step-off. Cardiovascular:     Rate and Rhythm: Normal rate and regular rhythm.  Pulmonary:     Effort: Pulmonary effort is normal.  Chest:     Comments: Status post bilateral mastectomy.  Tenderness to palpation of the right chest wall with no deformity,  crepitus, ecchymosis or flail segment. Abdominal:     General: There is no distension.  Musculoskeletal:     Cervical back: Normal range of motion and neck supple.     Right lower leg: No tenderness. No edema.     Left lower leg: No tenderness. No edema.  Skin:    General: Skin is warm and dry.     Findings: No erythema.  Neurological:     Mental Status: She is alert.     Comments: Mental Status:  Alert, thought content appropriate, able to give a coherent history. Speech fluent without evidence of aphasia. Able to follow 2 step commands without difficulty.  Cranial Nerves:  II:  Peripheral visual fields grossly normal, pupils equal, round, reactive to light III,IV, VI: ptosis not present, extra-ocular motions intact bilaterally  V,VII: smile symmetric, facial light touch sensation equal VIII: hearing grossly normal to voice  X: uvula elevates symmetrically  XI: bilateral shoulder shrug symmetric and strong XII: midline tongue extension without fassiculations Motor:  Normal tone. 5/5 strength of BUE and BLE major muscle groups including strong and equal grip strength and dorsiflexion/plantar flexion Sensory: light touch normal in all extremities. Cerebellar: normal finger-to-nose with bilateral upper extremities Gait: normal gait and balance. Able to walk on toes and heels with ease.     Psychiatric:        Behavior: Behavior normal.     ED Results / Procedures / Treatments   Labs (all labs ordered are listed, but only abnormal results are displayed) Labs Reviewed  CBC WITH DIFFERENTIAL/PLATELET  VITAMIN B12  COMPREHENSIVE METABOLIC PANEL  CK  FOLATE    EKG None  Radiology No results found.  Procedures Procedures (including critical care time)  Medications Ordered in ED Medications  HYDROmorphone (DILAUDID) injection 0.5 mg (0.5 mg Intravenous Given 12/18/19 2030)  prochlorperazine (COMPAZINE) injection 10 mg (10 mg Intravenous Given 12/18/19 2032)    diphenhydrAMINE (BENADRYL) injection 25 mg (25 mg Intravenous Given 12/18/19 2031)  sodium chloride 0.9 % bolus 1,000 mL (1,000 mLs Intravenous New Bag/Given 12/18/19 2036)  ED Course  I have reviewed the triage vital signs and the nursing notes.  Pertinent labs & imaging results that were available during my care of the patient were reviewed by me and considered in my medical decision making (see chart for details).    MDM Rules/Calculators/A&P                      Patient with paresthesias of all extremities for 2-3 weeks, worsening over the last 4 days. Has associated right sided chest wall pain reproducible on palpation.  She is afebrile, vital signs are stable.  She is nontoxic in appearance.  Her EKG shows no acute ischemic abnormalities.  Her pain does not sound cardiac in etiology and I doubt ACS/MI.  Doubt PE given she is not tachycardic, exhibits no increased work of breathing or hypoxia, and her pain is reproducible on palpation.  She has no midline spine tenderness no red flag signs concerning for cauda equina or spinal abscess.  She is ambulatory without difficulty in the ED.  Her neurologic examination is reassuring with no focal deficits, ambulatory with steady gait and balance.  Differential is broad including electrolyte abnormality, vitamin B12 deficiency, brain neoplasm/metastases, musculoskeletal pain.  She is not currently undergoing any chemotherapy or cancer treatment.  Will obtain lab work and imaging and reassess.  9:05PM Signed out care to oncoming provider PA Phylliss Bob. Pending results of imaging and bloodwork. She has received migraine cocktail in the ED and will need reassessment. If bloodwork and imaging are reassuring that she will likely be stable for discharge home with outpatient follow-up with PCP and neurology.  She can increase her gabapentin dose to 300 mg 3 times daily and continue taking her Flexeril at home in the meantime.  If any concerning findings noted then  may require admission to the hospital.  Patient seen and evaluated by Dr. Eulis Foster who agrees with assessment and plan at this time.   Final Clinical Impression(s) / ED Diagnoses Final diagnoses:  Paresthesias  Bad headache  Acute chest wall pain    Rx / DC Orders ED Discharge Orders         Ordered    Ambulatory referral to Neurology    Comments: An appointment is requested in approximately: 2 weeks   12/18/19 2042           Debroah Baller 12/18/19 2106    Daleen Bo, MD 12/19/19 1229

## 2019-12-18 NOTE — ED Triage Notes (Signed)
She c/o right sided chest discomfort "for a long time now; but it's gotten worse for the past 4 days". She is in no distress. She is taken to a room, where EKG is performed.

## 2019-12-20 ENCOUNTER — Encounter: Payer: Self-pay | Admitting: Neurology

## 2019-12-20 ENCOUNTER — Telehealth: Payer: Self-pay

## 2019-12-20 NOTE — Telephone Encounter (Signed)
Patient calls back stating that she is doing better with her arm and joint pain, took Gabapentin 300 mg and it was a little too much, so is taking 200mg  and it seems to be working.  She has f/u with Cira Rue NP on 12/22/2019 and she was given a lab appointment for 9:00 that day.  She verbalized an understanding

## 2019-12-20 NOTE — Telephone Encounter (Signed)
Left voice message on patient identified voice mailbox, received after hours nurse note that she had experience some joint pain and right arm pain with swelling.  I asked her to call me back to let me know how she is doing and that we will get her in for a f/u in 1 to 2 weeks.

## 2019-12-22 ENCOUNTER — Inpatient Hospital Stay: Payer: BC Managed Care – PPO

## 2019-12-22 ENCOUNTER — Telehealth: Payer: Self-pay | Admitting: *Deleted

## 2019-12-22 ENCOUNTER — Encounter: Payer: BC Managed Care – PPO | Admitting: Nurse Practitioner

## 2019-12-22 ENCOUNTER — Other Ambulatory Visit: Payer: BC Managed Care – PPO

## 2019-12-22 ENCOUNTER — Inpatient Hospital Stay: Payer: BC Managed Care – PPO | Admitting: Nurse Practitioner

## 2019-12-22 NOTE — Telephone Encounter (Signed)
Called pt about missed appt and pt stated she wasn't feeling well. Wanted to reschedule for tomorrow. Per Cira Rue, NP, OK to add to 1/7 schedule at 1015am. Scheduling message sent

## 2019-12-23 ENCOUNTER — Other Ambulatory Visit: Payer: Self-pay

## 2019-12-23 ENCOUNTER — Inpatient Hospital Stay: Payer: BC Managed Care – PPO | Attending: Hematology

## 2019-12-23 ENCOUNTER — Inpatient Hospital Stay: Payer: BC Managed Care – PPO | Admitting: Nurse Practitioner

## 2019-12-23 ENCOUNTER — Ambulatory Visit
Admission: RE | Admit: 2019-12-23 | Discharge: 2019-12-23 | Disposition: A | Discharge status: Home / Self Care | Payer: BC Managed Care – PPO | Source: Ambulatory Visit | Attending: Radiation Oncology | Admitting: Radiation Oncology

## 2019-12-23 ENCOUNTER — Encounter: Payer: Self-pay | Admitting: Radiation Oncology

## 2019-12-23 VITALS — BP 116/77 | HR 78 | Temp 98.0°F | Resp 18 | Ht 63.0 in | Wt 179.2 lb

## 2019-12-23 VITALS — BP 114/77 | HR 79 | Temp 98.0°F | Resp 18 | Ht 63.0 in | Wt 180.1 lb

## 2019-12-23 DIAGNOSIS — C50411 Malignant neoplasm of upper-outer quadrant of right female breast: Secondary | ICD-10-CM | POA: Insufficient documentation

## 2019-12-23 DIAGNOSIS — Z923 Personal history of irradiation: Secondary | ICD-10-CM | POA: Insufficient documentation

## 2019-12-23 DIAGNOSIS — Z79811 Long term (current) use of aromatase inhibitors: Secondary | ICD-10-CM | POA: Diagnosis not present

## 2019-12-23 DIAGNOSIS — Z170 Estrogen receptor positive status [ER+]: Secondary | ICD-10-CM | POA: Insufficient documentation

## 2019-12-23 DIAGNOSIS — R200 Anesthesia of skin: Secondary | ICD-10-CM | POA: Insufficient documentation

## 2019-12-23 DIAGNOSIS — K219 Gastro-esophageal reflux disease without esophagitis: Secondary | ICD-10-CM | POA: Diagnosis not present

## 2019-12-23 DIAGNOSIS — R05 Cough: Secondary | ICD-10-CM | POA: Diagnosis not present

## 2019-12-23 DIAGNOSIS — Z79899 Other long term (current) drug therapy: Secondary | ICD-10-CM | POA: Diagnosis not present

## 2019-12-23 DIAGNOSIS — F419 Anxiety disorder, unspecified: Secondary | ICD-10-CM | POA: Insufficient documentation

## 2019-12-23 DIAGNOSIS — Z9221 Personal history of antineoplastic chemotherapy: Secondary | ICD-10-CM | POA: Diagnosis not present

## 2019-12-23 DIAGNOSIS — E876 Hypokalemia: Secondary | ICD-10-CM | POA: Insufficient documentation

## 2019-12-23 DIAGNOSIS — R232 Flushing: Secondary | ICD-10-CM | POA: Diagnosis not present

## 2019-12-23 DIAGNOSIS — M1990 Unspecified osteoarthritis, unspecified site: Secondary | ICD-10-CM | POA: Diagnosis not present

## 2019-12-23 LAB — CBC WITH DIFFERENTIAL (CANCER CENTER ONLY)
Abs Immature Granulocytes: 0.07 10*3/uL (ref 0.00–0.07)
Basophils Absolute: 0.1 10*3/uL (ref 0.0–0.1)
Basophils Relative: 1 %
Eosinophils Absolute: 0.2 10*3/uL (ref 0.0–0.5)
Eosinophils Relative: 3 %
HCT: 36.7 % (ref 36.0–46.0)
Hemoglobin: 12.1 g/dL (ref 12.0–15.0)
Immature Granulocytes: 1 %
Lymphocytes Relative: 18 %
Lymphs Abs: 1.2 10*3/uL (ref 0.7–4.0)
MCH: 29.1 pg (ref 26.0–34.0)
MCHC: 33 g/dL (ref 30.0–36.0)
MCV: 88.2 fL (ref 80.0–100.0)
Monocytes Absolute: 0.8 10*3/uL (ref 0.1–1.0)
Monocytes Relative: 11 %
Neutro Abs: 4.5 10*3/uL (ref 1.7–7.7)
Neutrophils Relative %: 66 %
Platelet Count: 294 10*3/uL (ref 150–400)
RBC: 4.16 MIL/uL (ref 3.87–5.11)
RDW: 12.6 % (ref 11.5–15.5)
WBC Count: 6.9 10*3/uL (ref 4.0–10.5)
nRBC: 0 % (ref 0.0–0.2)

## 2019-12-23 LAB — CMP (CANCER CENTER ONLY)
ALT: 19 U/L (ref 0–44)
AST: 19 U/L (ref 15–41)
Albumin: 4.3 g/dL (ref 3.5–5.0)
Alkaline Phosphatase: 88 U/L (ref 38–126)
Anion gap: 10 (ref 5–15)
BUN: 9 mg/dL (ref 6–20)
CO2: 28 mmol/L (ref 22–32)
Calcium: 9.8 mg/dL (ref 8.9–10.3)
Chloride: 104 mmol/L (ref 98–111)
Creatinine: 0.82 mg/dL (ref 0.44–1.00)
GFR, Est AFR Am: >60 mL/min (ref >60)
GFR, Estimated: >60 mL/min (ref >60)
Glucose, Bld: 98 mg/dL (ref 70–99)
Potassium: 3.3 mmol/L — ABNORMAL LOW (ref 3.5–5.1)
Sodium: 142 mmol/L (ref 135–145)
Total Bilirubin: 0.4 mg/dL (ref 0.3–1.2)
Total Protein: 7.9 g/dL (ref 6.5–8.1)

## 2019-12-23 MED ORDER — POTASSIUM CHLORIDE CRYS ER 20 MEQ PO TBCR: EXTENDED_RELEASE_TABLET | ORAL | 1 refills | Status: DC

## 2019-12-23 NOTE — Progress Notes (Signed)
Radiation Oncology         [phone] ________________________________  Name: [name] MRN: [id number]  Date: 12/23/2019  DOB: November 09, 1966  Follow-Up Visit Note  CC: Heywood Bene, PA-C  Williams, Breejante J, *    ICD-10-CM   1. Malignant neoplasm of upper-outer quadrant of right breast in female, estrogen receptor positive ([place])  C50.411    Z17.0     Diagnosis:   Stage IB(pT1a, pN1a, cM0), InvasiveLobularCarcinoma, ER+/ PR-/ Her2-, Grade1  Interval Since Last Radiation:  2 months, 3 weeks 08/17/2019 through 09/28/2019 Site Technique Total Dose (Gy) Dose per Fx (Gy) Completed Fx Beam Energies  Thorax: CW_Rt_Bst_boost Electron 10/10 2 5/5 6E  Thorax: CW_Rt 3D 50/50 2 25/25 10X, 15X, 6X  Thorax: CW_Rt_axilla Complex 50/50 2 25/25 10X    Narrative:  The patient returns today for routine follow-up. She was taking anastrozole but experienced hot flashes and joint pain. She was switched to exemestane in 10/2019.  On review of systems, she reports pain in her hands, rated 4/10, and joint pain. She is curious is the joint pain is associated with exemestane. She also reports lingering fatigue, but she attributes it to other factors. She endorses use of mederma on her scars and denies use of moisturizer.  ALLERGIES:  is allergic to codeine and tramadol.  Meds: Current Outpatient Medications  Medication Sig Dispense Refill  . acetaminophen (TYLENOL) 500 MG tablet Take 1,000 mg by mouth every 6 (six) hours as needed for moderate pain.    . Cholecalciferol (VITAMIN D3) 125 MCG (5000 UT) CAPS Take 5,000 Units by mouth daily.    . Cyanocobalamin (B-12) 2500 MCG TABS Take 2,500 mcg by mouth daily.    . cyclobenzaprine (FLEXERIL) 10 MG tablet Take 1 tablet (10 mg total) by mouth 2 (two) times daily as needed for muscle spasms. 30 tablet 0  . Diclofenac Sodium CR 100 MG 24 hr tablet Take 1 tablet (100 mg total) by mouth daily. 5 tablet 0  . docusate sodium (COLACE) 100 MG  capsule Take 100 mg by mouth daily.    Marland Kitchen exemestane (AROMASIN) 25 MG tablet Take 1 tablet (25 mg total) by mouth daily after breakfast. 30 tablet 5  . gabapentin (NEURONTIN) 300 MG capsule Take 1 capsule (300 mg total) by mouth 2 (two) times daily. 60 capsule 0  . ibuprofen (ADVIL,MOTRIN) 200 MG tablet Take 600 mg by mouth every 6 (six) hours as needed for moderate pain.     . Iron-Vitamin C 65-125 MG TABS Take 1 tablet by mouth daily.    Marland Kitchen LORazepam (ATIVAN) 0.5 MG tablet Take 1 tablet (0.5 mg total) by mouth every 8 (eight) hours as needed for anxiety. 15 tablet 0  . Multiple Vitamin (MULTIVITAMIN WITH MINERALS) TABS tablet Take 1 tablet by mouth daily.    Marland Kitchen omeprazole (PRILOSEC) 20 MG capsule Take 20 mg by mouth daily.    . ondansetron (ZOFRAN) 4 MG tablet Take 1 tablet (4 mg total) by mouth every 8 (eight) hours as needed for nausea or vomiting. 20 tablet 0  . potassium chloride SA (KLOR-CON) 20 MEQ tablet Take 1 tablet once daily on Mondays Wednesdays and Fridays 30 tablet 1  . predniSONE (DELTASONE) 5 MG tablet 5 tab x 2 days, 4 tab x 2 days, 3 tab x 2 days, 2 tab x 2 days, 1 tab x 2 days, stop 21 tablet 0  . prochlorperazine (COMPAZINE) 10 MG tablet Take 1 tablet (10 mg total) by mouth  every 6 (six) hours as needed (Nausea or vomiting). 30 tablet 1  . venlafaxine XR (EFFEXOR-XR) 75 MG 24 hr capsule TAKE 1 CAPSULE (75 MG TOTAL) BY MOUTH DAILY WITH BREAKFAST. 90 capsule 1  . zolpidem (AMBIEN) 5 MG tablet Take 1 tablet (5 mg total) by mouth at bedtime as needed for sleep. 30 tablet 1   No current facility-administered medications for this encounter.    Physical Findings: The patient is in no acute distress. Patient is alert and oriented.  height is '5\' 3"'  (1.6 m) and weight is 179 lb 4 oz (81.3 kg). Her temporal temperature is 98 F (36.7 C). Her blood pressure is 116/77 and her pulse is 78. Her respiration is 18 and oxygen saturation is 100%. .  No significant changes. Lungs are clear to  auscultation bilaterally. Heart has regular rate and rhythm. No palpable cervical, supraclavicular, or axillary adenopathy. Abdomen soft, non-tender, normal bowel sounds. Left chest: Reconstructed breast with permanent implants in place Right Chest: Reconstructed breast with permanent implant in place.  Some hyperpigmentation changes.  Skin is well-healed.  No visible or palpable signs of recurrence  Lab Findings: Lab Results  Component Value Date   WBC 6.9 12/23/2019   HGB 12.1 12/23/2019   HCT 36.7 12/23/2019   MCV 88.2 12/23/2019   PLT 294 12/23/2019    Radiographic Findings: CT Head Wo Contrast  Result Date: 12/18/2019 CLINICAL DATA:  Headache with upper extremity numbness EXAM: CT HEAD WITHOUT CONTRAST CT CERVICAL SPINE WITHOUT CONTRAST TECHNIQUE: Multidetector CT imaging of the head and cervical spine was performed following the standard protocol without intravenous contrast. Multiplanar CT image reconstructions of the cervical spine were also generated. COMPARISON:  CT brain 11/05/2014 FINDINGS: CT HEAD FINDINGS Brain: No evidence of acute infarction, hemorrhage, hydrocephalus, extra-axial collection or mass lesion/mass effect. Vascular: No hyperdense vessel or unexpected calcification. Skull: Normal. Negative for fracture or focal lesion. Sinuses/Orbits: No acute finding. Other: None CT CERVICAL SPINE FINDINGS Alignment: Normal. Skull base and vertebrae: No acute fracture. No primary bone lesion or focal pathologic process. Soft tissues and spinal canal: No prevertebral fluid or swelling. No visible canal hematoma. Disc levels:  Mild degenerative changes C5-C6. Upper chest: Negative. Other: None IMPRESSION: 1. Negative non contrasted CT appearance of the brain 2. No CT evidence for acute osseous abnormality Electronically Signed   By: Donavan Foil M.D.   On: 12/18/2019 20:41   CT Cervical Spine Wo Contrast  Result Date: 12/18/2019 CLINICAL DATA:  Headache with upper extremity numbness  EXAM: CT HEAD WITHOUT CONTRAST CT CERVICAL SPINE WITHOUT CONTRAST TECHNIQUE: Multidetector CT imaging of the head and cervical spine was performed following the standard protocol without intravenous contrast. Multiplanar CT image reconstructions of the cervical spine were also generated. COMPARISON:  CT brain 11/05/2014 FINDINGS: CT HEAD FINDINGS Brain: No evidence of acute infarction, hemorrhage, hydrocephalus, extra-axial collection or mass lesion/mass effect. Vascular: No hyperdense vessel or unexpected calcification. Skull: Normal. Negative for fracture or focal lesion. Sinuses/Orbits: No acute finding. Other: None CT CERVICAL SPINE FINDINGS Alignment: Normal. Skull base and vertebrae: No acute fracture. No primary bone lesion or focal pathologic process. Soft tissues and spinal canal: No prevertebral fluid or swelling. No visible canal hematoma. Disc levels:  Mild degenerative changes C5-C6. Upper chest: Negative. Other: None IMPRESSION: 1. Negative non contrasted CT appearance of the brain 2. No CT evidence for acute osseous abnormality Electronically Signed   By: Donavan Foil M.D.   On: 12/18/2019 20:41    Impression:  The patient is recovering from the effects of radiation.  Skin is healed well.  Plan: As needed follow-up in radiation oncology.  Patient will continue close follow-up in medical oncology and continue on adjuvant hormonal therapy  ____________________________________ Gery Pray, MD   This document serves as a record of services personally performed by Gery Pray, MD. It was created on his behalf by Wilburn Mylar, a trained medical scribe. The creation of this record is based on the scribe's personal observations and the provider's statements to them. This document has been checked and approved by the attending provider.

## 2019-12-23 NOTE — Progress Notes (Signed)
CLINIC:  Survivorship   REASON FOR VISIT:  Routine follow-up post-treatment for a recent history of breast cancer.  Patient Care Team: Merwyn Katos as PCP - General (Physician Assistant) Jovita Kussmaul, MD as Consulting Physician (General Surgery) Truitt Merle, MD as Consulting Physician (Hematology) Gery Pray, MD as Consulting Physician (Radiation Oncology) Rockwell Germany, RN as Oncology Nurse Navigator Mauro Kaufmann, RN as Oncology Nurse Navigator Alla Feeling, NP as Nurse Practitioner (Nurse Practitioner)  BRIEF ONCOLOGIC HISTORY:  Oncology History Overview Note  Cancer Staging Malignant neoplasm of upper-outer quadrant of right breast in female, estrogen receptor positive ([place]) Staging form: Breast, AJCC 8th Edition - Clinical stage from 01/15/2019: Stage IA (cT1b, cN0, cM0, G1, ER+, PR-, HER2-) - Signed by Truitt Merle, MD on 01/26/2019 - Pathologic stage from 02/24/2019: Stage IB (pT1a, pN1a, cM0, G1, ER+, PR-, HER2-) - Signed by Truitt Merle, MD on 03/18/2019     Malignant neoplasm of upper-outer quadrant of right breast in female, estrogen receptor positive ([place])  01/15/2019 Cancer Staging   Staging form: Breast, AJCC 8th Edition - Clinical stage from 01/15/2019: Stage IA (cT1b, cN0, cM0, G1, ER+, PR-, HER2-) - Signed by Truitt Merle, MD on 01/26/2019   01/15/2019 Mammogram   Diagnostic Mammogram 01/15/19  IMPRESSION Targeted ultrasound is performed, showing right breast 10 o'clock 5 cm from the nipple hypoechoic round mass measuring 0.7 x 0.5 x 0.6 cm. Adjacent to it there is a probable complicated cyst measuring 4 mm. There is no evidence of right axillary lymphadenopathy.   01/15/2019 Initial Biopsy   Diagnosis 01/15/19  Breast, right, needle core biopsy, upper outer - INVASIVE AND IN SITU MAMMARY CARCINOMA WITH CALCIFICATIONS. - SEE COMMENT. 1 of 3 FINAL for Rhoads, Glee P ([id number]) Microscopic Comment The carcinoma appears grade I. The malignant  cells are positive for cytokeratin AE1/AE3 and negative for E-cadherin. A breast prognostic profile will be performed and the results reported separately. Dr Vicente Males has reviewed the case and concurs with this interpretation. The results are called to [facility] on 01/18/2019. (JBK:ah:ecj 01/18/19)   01/15/2019 Receptors her2   PROGNOSTIC INDICATORS Results: IMMUNOHISTOCHEMICAL AND MORPHOMETRIC ANALYSIS PERFORMED MANUALLY The tumor cells are NEGATIVE for Her2 (0). Estrogen Receptor: 10%, POSITIVE, MODERATE STAINING INTENSITY Progesterone Receptor: 0%, NEGATIVE Proliferation Marker Ki67: <1%   01/21/2019 Initial Diagnosis   Malignant neoplasm of upper-outer quadrant of right breast in female, estrogen receptor positive ([place])   01/29/2019 Breast MRI   IMPRESSION: 1. The biopsy-proven site of malignancy in the upper-outer quadrant of the right breast resides centrally with in an area of suspicious non mass enhancement spanning 6-7 cm.  2. There is a 6 mm suspicious enhancing mass in the lower-inner quadrant of the right breast, posterior depth.  3. There is a 9 mm indeterminate enhancing mass in the lower outer quadrant of the right breast.  4.  No evidence of malignancy in the left breast.   02/24/2019 Surgery   RIGHT MASTECTOMY WITH SENTINEL LYMPH NODE MAPPING AND LEFT PROPHYLACTIC MASTECTOMY by Dr. Marlou Starks  BREAST RECONSTRUCTION WITH PLACEMENT OF TISSUE EXPANDER by Dr. Marla Roe  02/24/19    02/24/2019 Pathology Results   Diagnosis 1. Breast, simple mastectomy, Left - BENIGN BREAST PARENCHYMA WITH FIBROCYSTIC CHANGE. - NEGATIVE FOR CARCINOMA. 2. Breast, simple mastectomy, Right - LOBULAR CARCINOMA IN SITU, INTERMEDIATE TO HIGH NUCLEAR GRADE. SEE NOTE. - NO EVIDENCE OF RESIDUAL INVASIVE CARCINOMA. - RESECTION MARGINS ARE NEGATIVE FOR IN SITU OR INVASIVE  CARCINOMA. - BIOPSY SITE CHANGES. - SEE ONCOLOGY TABLE. 3. Lymph node, sentinel, biopsy, Right #1 -  IMMUNOHISTOCHEMICAL STAIN FOR AE1/AE3 IS NEGATIVE FOR CARCINOMA (0/1). 4. Lymph node, sentinel, biopsy, Right - IMMUNOHISTOCHEMICAL STAIN FOR AE1/AE3 IS NEGATIVE FOR CARCINOMA (0/1). 5. Lymph node, sentinel, biopsy, Right #2 - IMMUNOHISTOCHEMICAL STAIN FOR AE1/AE3 IS NEGATIVE FOR CARCINOMA (0/1). 6. Lymph node, sentinel, biopsy, Right - IMMUNOHISTOCHEMICAL STAIN FOR AE1/AE3 IS NEGATIVE FOR CARCINOMA (0/1). 7. Lymph node, sentinel, biopsy, Right - METASTATIC CARCINOMA TO ONE LYMPH NODE (1/1) (CONFIRMED WITH IMMUNOHISTOCHEMICAL STAIN FOR AE1/AE3) - FOCUS OF METASTATIC CARCINOMA MEASURES JUST OVER 0.2 CM IN GREATEST DIMENSION - NO EVIDENCE OF EXTRANODAL EXTENSION   02/24/2019 Cancer Staging   Staging form: Breast, AJCC 8th Edition - Pathologic stage from 02/24/2019: Stage IB (pT1a, pN1a, cM0, G1, ER+, PR-, HER2-) - Signed by Truitt Merle, MD on 03/18/2019   04/01/2019 - 06/03/2019 Chemotherapy   adjuvant Taxol and Carboplatin every 3 weeks for 4 cycles starting in 2 weeks starting 04/01/19-06/03/19   06/2019 -  Anti-estrogen oral therapy   Anastrozole 45m daily starting 06/2019. Held after 2 weeks due to hot flashes and joint pain and RT.  Switched to Exemestane in 10/2019   06/30/2019 Surgery   REMOVAL OF BILATERAL TISSUE EXPANDERS WITH PLACEMENT OF BILATERAL BREAST IMPLANTS by Dr DMarla Roe 06/30/19    08/17/2019 -  Radiation Therapy   Adjuvant Radiaiton with Dr KSondra Comeon 08/17/19 and plans to complete on 09/28/19.    12/23/2019 Survivorship   SCP delivered by LCira Rue NP      INTERVAL HISTORY:  [name] to review her survivorship care plan detailing her treatment course for breast cancer, as well as monitoring long-term side effects of that treatment, education regarding health maintenance, screening, and overall wellness and health promotion.     Overall, [name] reports feeling well in general. She was seen in ED  recently for severe joint pain and arm numbness. She started gabapentin 300 mg BID and pain is better but "still bad" in hands, arms, and hips bilaterally. Hot flashes are better than on anastrozole, but pain is not better. She can function, but pain limits some activities. She rates hand pain 4/10 today. Takes tylenol occasionally but not very effective. Appetite is normal. She is frustrated with weight gain. Manages constipation with laxative PRN. No n/v. Denies abdominal or new back/bone pain. Denies recent fever, chills, chest pain, dyspnea, or leg swelling. Breast is "tan" otherwise no concerns. Has a dry cough after meals related to GERD, stable.     ONCOLOGY TREATMENT TEAM:  1. [facility]  [facility] 2. Medical Oncologist: Dr. FBurr Medico 3. Radiation Oncologist: Dr. KSondra Come   PAST MEDICAL/SURGICAL HISTORY:  Past Medical History:  Diagnosis Date  . Anemia   . Anxiety   . Arthritis   . Cancer ([facility]    right breast  . Family history of adverse reaction to anesthesia    sister has same issues  . GERD (gastroesophageal reflux disease)   . PONV (postoperative nausea and vomiting)    Past Surgical History:  Procedure Laterality Date  . BREAST RECONSTRUCTION WITH PLACEMENT OF TISSUE EXPANDER AND FLEX HD (ACELLULAR HYDRATED DERMIS) Bilateral 02/24/2019   Procedure: BREAST RECONSTRUCTION WITH PLACEMENT OF TISSUE EXPANDER AND FLEX HD (ACELLULAR HYDRATED DERMIS);  Surgeon: DWallace Going DO;  [facility]  Service: Plastics;  Laterality: Bilateral;  . BREAST  SURGERY     left breast bx  . ESOPHAGOGASTRODUODENOSCOPY (EGD) WITH ESOPHAGEAL DILATION    . EYE SURGERY     dart to the eye  (right)  . FACIAL FRACTURE SURGERY    . FRACTURE SURGERY    . KNEE SURGERY    . MASTECTOMY W/ SENTINEL NODE BIOPSY Bilateral 02/24/2019   Procedure: RIGHT MASTECTOMY WITH SENTINEL LYMPH NODE MAPPING AND LEFT PROPHYLACTIC MASTECTOMY;  Surgeon: Jovita Kussmaul, MD;  Location: [facility];   Service: General;  Laterality: Bilateral;  . REMOVAL OF BILATERAL TISSUE EXPANDERS WITH PLACEMENT OF BILATERAL BREAST IMPLANTS Bilateral 06/30/2019   Procedure: REMOVAL OF BILATERAL TISSUE EXPANDERS WITH PLACEMENT OF BILATERAL BREAST IMPLANTS;  Surgeon: Wallace Going, DO;  Location: [facility];  Service: Plastics;  Laterality: Bilateral;  . SHOULDER SURGERY       ALLERGIES:  Allergies  Allergen Reactions  . Codeine Nausea And Vomiting  . Tramadol Nausea And Vomiting     CURRENT MEDICATIONS:  Outpatient Encounter Medications as of 12/23/2019  Medication Sig  . acetaminophen (TYLENOL) 500 MG tablet Take 1,000 mg by mouth every 6 (six) hours as needed for moderate pain.  . Cholecalciferol (VITAMIN D3) 125 MCG (5000 UT) CAPS Take 5,000 Units by mouth daily.  . Cyanocobalamin (B-12) 2500 MCG TABS Take 2,500 mcg by mouth daily.  . cyclobenzaprine (FLEXERIL) 10 MG tablet Take 1 tablet (10 mg total) by mouth 2 (two) times daily as needed for muscle spasms.  . Diclofenac Sodium CR 100 MG 24 hr tablet Take 1 tablet (100 mg total) by mouth daily.  Marland Kitchen docusate sodium (COLACE) 100 MG capsule Take 100 mg by mouth daily.  Marland Kitchen exemestane (AROMASIN) 25 MG tablet Take 1 tablet (25 mg total) by mouth daily after breakfast.  . gabapentin (NEURONTIN) 300 MG capsule Take 1 capsule (300 mg total) by mouth 2 (two) times daily.  Marland Kitchen ibuprofen (ADVIL,MOTRIN) 200 MG tablet Take 600 mg by mouth every 6 (six) hours as needed for moderate pain.   . Iron-Vitamin C 65-125 MG TABS Take 1 tablet by mouth daily.  Marland Kitchen LORazepam (ATIVAN) 0.5 MG tablet Take 1 tablet (0.5 mg total) by mouth every 8 (eight) hours as needed for anxiety.  . Multiple Vitamin (MULTIVITAMIN WITH MINERALS) TABS tablet Take 1 tablet by mouth daily.  Marland Kitchen omeprazole (PRILOSEC) 20 MG capsule Take 20 mg by mouth daily.  . ondansetron (ZOFRAN) 4 MG tablet Take 1 tablet (4 mg total) by mouth every 8 (eight) hours as needed for nausea or  vomiting.  . predniSONE (DELTASONE) 5 MG tablet 5 tab x 2 days, 4 tab x 2 days, 3 tab x 2 days, 2 tab x 2 days, 1 tab x 2 days, stop  . prochlorperazine (COMPAZINE) 10 MG tablet Take 1 tablet (10 mg total) by mouth every 6 (six) hours as needed (Nausea or vomiting).  . venlafaxine XR (EFFEXOR-XR) 75 MG 24 hr capsule TAKE 1 CAPSULE (75 MG TOTAL) BY MOUTH DAILY WITH BREAKFAST.  Marland Kitchen zolpidem (AMBIEN) 5 MG tablet Take 1 tablet (5 mg total) by mouth at bedtime as needed for sleep.  . potassium chloride SA (KLOR-CON) 20 MEQ tablet Take 1 tablet once daily on Mondays Wednesdays and Fridays   No facility-administered encounter medications on file as of 12/23/2019.     ONCOLOGIC FAMILY HISTORY:  Family History  Problem Relation Age of Onset  . Brain cancer Father   . Cancer Maternal Uncle  melanoma   . Cancer Paternal Uncle        gastric cancer  . Spinal muscular atrophy Daughter   . Spinal muscular atrophy Child   . Breast cancer Neg Hx      GENETIC COUNSELING/TESTING: N/A  SOCIAL HISTORY:  Ariabella P Osmanovic denies tobacco, drug, or alcohol use    PHYSICAL EXAMINATION:  Vital Signs:   Vitals:   12/23/19 1032  BP: 114/77  Pulse: 79  Resp: 18  Temp: 98 F (36.7 C)  SpO2: 99%   Filed Weights   12/23/19 1032  Weight: 180 lb 1.6 oz (81.7 kg)   General: Well-nourished, well-appearing female in no acute distress.  HEENT: Sclerae anicteric.  Lymph: No cervical, supraclavicular, or infraclavicular lymphadenopathy noted on palpation.  Cardiovascular: Regular rate and rhythm.Marland Kitchen Respiratory: Clear bilaterally with normal breathing effort  GI: Abdomen soft and round; non-tender, non-distended. Bowel sounds normoactive.  Neuro: No focal deficits. Steady gait.  Psych: Mood and affect normal and appropriate for situation.  Extremities: No edema. MSK: No focal spinal tenderness to palpation.  Full range of motion in bilateral upper extremities Skin: Warm and dry. Breast: s/p  bilateral mastectomies with implants, scars completely healed. Right breast with mild hyperpigmentation, no significant erythema. No palpable mass along the chest wall or either breast or axilla that I could appreciate.   LABORATORY DATA:   CBC Latest Ref Rng & Units 12/23/2019 12/18/2019 09/20/2019  WBC 4.0 - 10.5 K/uL 6.9 5.3 6.0  Hemoglobin 12.0 - 15.0 g/dL 12.1 12.0 12.0  Hematocrit 36.0 - 46.0 % 36.7 37.4 36.4  Platelets 150 - 400 K/uL 294 291 240   CMP Latest Ref Rng & Units 12/23/2019 12/18/2019 09/20/2019  Glucose 70 - 99 mg/dL 98 88 70  BUN 6 - 20 mg/dL '9 11 12  ' Creatinine 0.44 - 1.00 mg/dL 0.82 0.70 0.69  Sodium 135 - 145 mmol/L 142 141 142  Potassium 3.5 - 5.1 mmol/L 3.3(L) 3.6 3.7  Chloride 98 - 111 mmol/L 104 104 106  CO2 22 - 32 mmol/L '28 28 26  ' Calcium 8.9 - 10.3 mg/dL 9.8 9.6 9.8  Total Protein 6.5 - 8.1 g/dL 7.9 7.4 6.9  Total Bilirubin 0.3 - 1.2 mg/dL 0.4 0.4 0.3  Alkaline Phos 38 - 126 U/L 88 78 73  AST 15 - 41 U/L '19 22 16  ' ALT 0 - 44 U/L '19 23 14     ' DIAGNOSTIC IMAGING:  None for this visit.      ASSESSMENT AND PLAN:  Ms.. Bayless is a pleasant 54 y.o. 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  She presents to the Survivorship Clinic for our initial meeting and routine follow-up post-completion of treatment for breast cancer.    1. Stage IB right breast cancer:  Ms. Vandekamp has recovered well from definitive treatment for breast cancer. Labs and breast exam today are unremarkable except K 3.3, I recommend she begin oral supplement. No clinical concern for recurrence. She will follow-up with her medical oncologist, Dr. Burr Medico in 03/2020 with history and physical exam per surveillance protocol.  She will continue her anti-estrogen therapy with exemestane.  Thus far, she has moderate to severe at times joint pain but is interested in continuing for now. Hot flashes are better since changing from anastrozole. She was instructed to make Dr. Burr Medico or myself aware if  she begins to experience any worsening side effects of the medication and I could see her back in clinic to help manage those side effects, as needed.  Today, a comprehensive survivorship care plan and treatment summary was reviewed with the patient today detailing her breast cancer diagnosis, treatment course, potential late/long-term effects of treatment, appropriate follow-up care with recommendations for the future, and patient education resources.  A copy of this summary, along with a letter will be sent to the patient's primary care provider via In [employer]asket message after today's visit.    2. Joint pain, numbness: Developed after she started anastrozole which she discontinued. Joint pain in hands, arms, and hips persisted after changing to exemestane. She remains able to function, some activities are more difficult at times. Tylenol does not help. She was given Gabapentin and currently takes 300 mg BID. I recommend to increase to 600 mg at night to see if this helps. She will not use ambien. If pain worsens or fails to improve we will try other interventions or possibly change AI. She is on venlafaxine, will not use cymbalta due to increased risk of serotonin syndrome. We discussed that her ER+ is only 10% and the benefit of AI is likely not very high and can d/c if she is not able to tolerate ultimately. She wishes to continue for now.   3. Bone health:  Given Ms. Werst's age/history of breast cancer and her current treatment regimen including anti-estrogen therapy with exemestane, she is at risk for bone demineralization.  Will obtain baseline DEXA in next few months. In the meantime, she was encouraged to increase her consumption of foods rich in calcium, as well as increase her weight-bearing  activities.  She was given education on specific activities to promote bone health.  4. Cancer screening:  Due to Ms. Sgro's history and her age, she should receive screening for skin cancers, colon cancer, and gynecologic cancers. She has not had screening colonoscopy yet and agrees to GI referral to discuss this. The information and recommendations are listed on the patient's comprehensive care plan/treatment summary and were reviewed in detail with the patient.    5. Health maintenance and wellness promotion: Ms. Bouchard was encouraged to consume 5-7 servings of fruits and vegetables per day. We reviewed the "Nutrition Rainbow" handout, as well as the handout "Take Control of Your Health and Reduce Your Cancer Risk" from the [place].  She was also encouraged to engage in moderate to vigorous exercise for 30 minutes per day most days of the week. We discussed the [employer] fitness program, which is designed for cancer survivors to help them become more physically fit after cancer treatments.  She was instructed to limit her alcohol consumption and continue to abstain from tobacco use.     6. Support services/counseling: It is not uncommon for this period of the patient's cancer care trajectory to be one of many emotions and stressors.  We discussed an opportunity for her to participate in the next session of [facility] ("Finding Your New Normal") support group series designed for patients after they have completed treatment.   Ms. Cohenour was encouraged to take advantage of our many other support services programs, support groups, and/or counseling in coping with her new life as a cancer survivor after completing anti-cancer treatment.  She was offered support today through active listening and expressive supportive counseling.  She was given information regarding our available services and encouraged to contact me with any questions or for  help enrolling in any of our support group/programs.     Dispo:   -Return to cancer center 03/2020 -Phone visit in 2 weeks for f/u increased gabapentin and bone pain  -Referral to GI to discuss colonoscopy  -mild intermittent hypokalemia, begin oral K supplement MWF and increase in diet  -No role for mammogram  -Follow up with surgery as indicated -She is welcome to return back to the Survivorship Clinic at any time; no additional follow-up needed at this time.  -Consider referral back to survivorship as a long-term survivor for continued surveillance  Orders Placed This Encounter  Procedures  . Ambulatory referral to Gastroenterology    Referral Priority:   Routine    Referral Type:   Consultation    Referral Reason:   Specialty Services Required    Number of Visits Requested:   1    A total of (30) minutes of face-to-face time was spent with this patient with greater than 50% of that time in counseling and care-coordination.   Cira Rue, NP Survivorship Program [facility] [phone]   Note: PRIMARY CARE PROVIDER [place], [place] [phone]

## 2019-12-23 NOTE — Patient Instructions (Signed)
Coronavirus (COVID-19) Are you at risk?  Are you at risk for the Coronavirus (COVID-19)?  To be considered HIGH RISK for Coronavirus (COVID-19), you have to meet the following criteria:  . Traveled to [place], [place], [place], [place] or [place]; or in the [place] to [place], [place], [place], or [place]; and have fever, cough, and shortness of breath within the last 2 weeks of travel OR . Been in close contact with a person diagnosed with COVID-19 within the last 2 weeks and have fever, cough, and shortness of breath . IF YOU DO NOT MEET THESE CRITERIA, YOU ARE CONSIDERED LOW RISK FOR COVID-19.  What to do if you are HIGH RISK for COVID-19?  . If you are having a medical emergency, call 911. . Seek medical care right away. Before you go to a doctor's office, urgent care or emergency department, call ahead and tell them about your recent travel, contact with someone diagnosed with COVID-19, and your symptoms. You should receive instructions from your physician's office regarding next steps of care.  . When you arrive at healthcare provider, tell the healthcare staff immediately you have returned from visiting [place], [place], [place], [place] or [place]; or traveled in the [place] to [place], [place], [place], or [place]; in the last two weeks or you have been in close contact with a person diagnosed with COVID-19 in the last 2 weeks.   . Tell the health care staff about your symptoms: fever, cough and shortness of breath. . After you have been seen by a medical provider, you will be either: o Tested for (COVID-19) and discharged home on quarantine except to seek medical care if symptoms worsen, and asked to  - Stay home and avoid contact with others until you get your results (4-5 days)  - Avoid travel on public transportation if possible (such as bus, train, or airplane) or o Sent to the Emergency Department by EMS for evaluation, COVID-19 testing, and possible  admission depending on your condition and test results.  What to do if you are LOW RISK for COVID-19?  Reduce your risk of any infection by using the same precautions used for avoiding the common cold or flu:  . Wash your hands often with soap and warm water for at least 20 seconds.  If soap and water are not readily available, use an alcohol-based hand sanitizer with at least 60% alcohol.  . If coughing or sneezing, cover your mouth and nose by coughing or sneezing into the elbow areas of your shirt or coat, into a tissue or into your sleeve (not your hands). . Avoid shaking hands with others and consider head nods or verbal greetings only. . Avoid touching your eyes, nose, or mouth with unwashed hands.  . Avoid close contact with people who are sick. . Avoid places or events with large numbers of people in one location, like concerts or sporting events. . Carefully consider travel plans you have or are making. . If you are planning any travel outside or inside the [place], visit the CDC's Travelers' Health webpage for the latest health notices. . If you have some symptoms but not all symptoms, continue to monitor at home and seek medical attention if your symptoms worsen. . If you are having a medical emergency, call 911.   ADDITIONAL HEALTHCARE OPTIONS FOR PATIENTS  [facility] / e-Visit: [email or web address]         [facility]: [phone]  [facility]: [phone]                   [facility]: [phone]   

## 2019-12-23 NOTE — Progress Notes (Signed)
Pt presents today for f/u with Dr. Sondra Come. Pt reports lingering fatigue, but attributes it to other factors. Pt reports pain in hands, rated 4/10. Pt also c/o joint pain, and is curious if that is attributed to exemestane. Pt is not using moisturizer on breasts, except Mederma on scars. Breast is faintly hyperpigmented.   BP 116/77 (BP Location: Left Arm, Patient Position: Sitting)   Pulse 78   Temp 98 F (36.7 C) (Temporal)   Resp 18   Ht 5\' 3"  (1.6 m)   Wt 179 lb 4 oz (81.3 kg)   SpO2 100%   BMI 31.75 kg/m   Wt Readings from Last 3 Encounters:  12/23/19 179 lb 4 oz (81.3 kg)  12/23/19 180 lb 1.6 oz (81.7 kg)  10/05/19 176 lb 12.8 oz (80.2 kg)   Loma Sousa, RN BSN

## 2019-12-24 ENCOUNTER — Encounter: Payer: Self-pay | Admitting: Nurse Practitioner

## 2019-12-24 ENCOUNTER — Telehealth: Payer: Self-pay | Admitting: Nurse Practitioner

## 2019-12-24 LAB — CANCER ANTIGEN 27.29: CA 27.29: 31.8 U/mL (ref 0.0–38.6)

## 2019-12-24 NOTE — Telephone Encounter (Signed)
Scheduled appt per 1/7 los.  Spoke with pt and they are aware of the appt date and time.

## 2019-12-31 ENCOUNTER — Telehealth: Payer: Self-pay | Admitting: Hematology

## 2020-01-03 ENCOUNTER — Encounter: Payer: Self-pay | Admitting: Nurse Practitioner

## 2020-01-03 ENCOUNTER — Inpatient Hospital Stay (HOSPITAL_BASED_OUTPATIENT_CLINIC_OR_DEPARTMENT_OTHER): Payer: BC Managed Care – PPO | Admitting: Nurse Practitioner

## 2020-01-03 DIAGNOSIS — C50411 Malignant neoplasm of upper-outer quadrant of right female breast: Secondary | ICD-10-CM | POA: Diagnosis not present

## 2020-01-03 DIAGNOSIS — Z170 Estrogen receptor positive status [ER+]: Secondary | ICD-10-CM

## 2020-01-03 MED ORDER — LETROZOLE 2.5 MG PO TABS: 2.5000 mg | ORAL_TABLET | Freq: Every day | ORAL | 0 refills | Status: DC

## 2020-01-03 NOTE — Progress Notes (Signed)
[place]   [phone] Fax:([phone]) [phone]   Clinic Follow up Note   Patient Care Team: Merwyn Katos as PCP - General (Physician Assistant) Jovita Kussmaul, MD as Consulting Physician (General Surgery) Truitt Merle, MD as Consulting Physician (Hematology) Gery Pray, MD as Consulting Physician (Radiation Oncology) Rockwell Germany, RN as Oncology Nurse Navigator Mauro Kaufmann, RN as Oncology Nurse Navigator Alla Feeling, NP as Nurse Practitioner (Nurse Practitioner) 01/03/2020  I connected with [name] on 01/03/20 at  3:15 PM EST by telephone visit and verified that I am speaking with the correct person using two identifiers.   I discussed the limitations, risks, security and privacy concerns of performing an evaluation and management service by telemedicine and the availability of in-person appointments. I also discussed with the patient that there may be a patient responsible charge related to this service. The patient expressed understanding and agreed to proceed.   Other persons participating in the visit and their role in the encounter: N/a   Patient's location: home  Provider's location: [place] office  CHIEF COMPLAINT: F/u joint pain   SUMMARY OF ONCOLOGIC HISTORY: Oncology History Overview Note  Cancer Staging Malignant neoplasm of upper-outer quadrant of right breast in female, estrogen receptor positive ([place]) Staging form: Breast, AJCC 8th Edition - Clinical stage from 01/15/2019: Stage IA (cT1b, cN0, cM0, G1, ER+, PR-, HER2-) - Signed by Truitt Merle, MD on 01/26/2019 - Pathologic stage from 02/24/2019: Stage IB (pT1a, pN1a, cM0, G1, ER+, PR-, HER2-) - Signed by Truitt Merle, MD on 03/18/2019     Malignant neoplasm of upper-outer quadrant of right breast in female, estrogen receptor positive ([place])  01/15/2019 Cancer Staging   Staging form: Breast, AJCC 8th Edition - Clinical stage from 01/15/2019: Stage IA (cT1b, cN0, cM0, G1,  ER+, PR-, HER2-) - Signed by Truitt Merle, MD on 01/26/2019   01/15/2019 Mammogram   Diagnostic Mammogram 01/15/19  IMPRESSION Targeted ultrasound is performed, showing right breast 10 o'clock 5 cm from the nipple hypoechoic round mass measuring 0.7 x 0.5 x 0.6 cm. Adjacent to it there is a probable complicated cyst measuring 4 mm. There is no evidence of right axillary lymphadenopathy.   01/15/2019 Initial Biopsy   Diagnosis 01/15/19  Breast, right, needle core biopsy, upper outer - INVASIVE AND IN SITU MAMMARY CARCINOMA WITH CALCIFICATIONS. - SEE COMMENT. 1 of 3 FINAL for [name], [name] ([id number]) Microscopic Comment The carcinoma appears grade I. The malignant cells are positive for cytokeratin AE1/AE3 and negative for E-cadherin. A breast prognostic profile will be performed and the results reported separately. Dr Vicente Males has reviewed the case and concurs with this interpretation. The results are called to [facility] on 01/18/2019. (JBK:ah:ecj 01/18/19)   01/15/2019 Receptors her2   PROGNOSTIC INDICATORS Results: IMMUNOHISTOCHEMICAL AND MORPHOMETRIC ANALYSIS PERFORMED MANUALLY The tumor cells are NEGATIVE for Her2 (0). Estrogen Receptor: 10%, POSITIVE, MODERATE STAINING INTENSITY Progesterone Receptor: 0%, NEGATIVE Proliferation Marker Ki67: <1%   01/21/2019 Initial Diagnosis   Malignant neoplasm of upper-outer quadrant of right breast in female, estrogen receptor positive ([place])   01/29/2019 Breast MRI   IMPRESSION: 1. The biopsy-proven site of malignancy in the upper-outer quadrant of the right breast resides centrally with in an area of suspicious non mass enhancement spanning 6-7 cm.  2. There is a 6 mm suspicious enhancing mass in the lower-inner quadrant of the right breast, posterior depth.  3. There is a 9 mm indeterminate enhancing mass  in the lower outer quadrant of the right breast.  4.  No evidence of malignancy in the left breast.     02/24/2019 Surgery   RIGHT MASTECTOMY WITH SENTINEL LYMPH NODE MAPPING AND LEFT PROPHYLACTIC MASTECTOMY by Dr. Marlou Starks  BREAST RECONSTRUCTION WITH PLACEMENT OF TISSUE EXPANDER by Dr. Marla Roe  02/24/19    02/24/2019 Pathology Results   Diagnosis 1. Breast, simple mastectomy, Left - BENIGN BREAST PARENCHYMA WITH FIBROCYSTIC CHANGE. - NEGATIVE FOR CARCINOMA. 2. Breast, simple mastectomy, Right - LOBULAR CARCINOMA IN SITU, INTERMEDIATE TO HIGH NUCLEAR GRADE. SEE NOTE. - NO EVIDENCE OF RESIDUAL INVASIVE CARCINOMA. - RESECTION MARGINS ARE NEGATIVE FOR IN SITU OR INVASIVE CARCINOMA. - BIOPSY SITE CHANGES. - SEE ONCOLOGY TABLE. 3. Lymph node, sentinel, biopsy, Right #1 - IMMUNOHISTOCHEMICAL STAIN FOR AE1/AE3 IS NEGATIVE FOR CARCINOMA (0/1). 4. Lymph node, sentinel, biopsy, Right - IMMUNOHISTOCHEMICAL STAIN FOR AE1/AE3 IS NEGATIVE FOR CARCINOMA (0/1). 5. Lymph node, sentinel, biopsy, Right #2 - IMMUNOHISTOCHEMICAL STAIN FOR AE1/AE3 IS NEGATIVE FOR CARCINOMA (0/1). 6. Lymph node, sentinel, biopsy, Right - IMMUNOHISTOCHEMICAL STAIN FOR AE1/AE3 IS NEGATIVE FOR CARCINOMA (0/1). 7. Lymph node, sentinel, biopsy, Right - METASTATIC CARCINOMA TO ONE LYMPH NODE (1/1) (CONFIRMED WITH IMMUNOHISTOCHEMICAL STAIN FOR AE1/AE3) - FOCUS OF METASTATIC CARCINOMA MEASURES JUST OVER 0.2 CM IN GREATEST DIMENSION - NO EVIDENCE OF EXTRANODAL EXTENSION   02/24/2019 Cancer Staging   Staging form: Breast, AJCC 8th Edition - Pathologic stage from 02/24/2019: Stage IB (pT1a, pN1a, cM0, G1, ER+, PR-, HER2-) - Signed by Truitt Merle, MD on 03/18/2019   04/01/2019 - 06/03/2019 Chemotherapy   adjuvant Taxol and Carboplatin every 3 weeks for 4 cycles starting in 2 weeks starting 04/01/19-06/03/19   06/2019 -  Anti-estrogen oral therapy   Anastrozole 73m daily starting 06/2019. Held after 2 weeks due to hot flashes and joint pain and RT.  Switched to Exemestane in 10/2019   06/30/2019 Surgery   REMOVAL OF BILATERAL TISSUE EXPANDERS  WITH PLACEMENT OF BILATERAL BREAST IMPLANTS by Dr DMarla Roe 06/30/19    08/17/2019 - 09/28/2019 Radiation Therapy   Adjuvant Radiaiton with Dr KSondra Come Site Technique Total Dose (Gy) Dose per Fx (Gy) Completed Fx Beam Energies  Thorax: CW_Rt_Bst_boost Electron 10/10 2 5/5 6E  Thorax: CW_Rt 3D 50/50 2 25/25 10X, 15X, 6X  Thorax: CW_Rt_axilla Complex 50/50 2 25/25 10X     12/23/2019 Survivorship   SCP delivered by LCira Rue NP      CURRENT THERAPY:  Anastrozole 164mdaily starting 06/2019, held after 2 weeks due to hot flashes and joint pain and RT. Switched to Exemestane in 10/2019  INTERVAL HISTORY: Ms. PeBehrendtresents for phone visit. She continues to have body pain that wakes her up at night "every hour on the hour." She is extremely stiff. This did not improve after increasing gabapentin to 600 mg at night. Pain improves after she gets up and moves around except the pain in her hands which does not improve. Some activities are limited. She is concerned about side effects of other options and she does not want to stop "cancer pill" altogether. Denies other issues, such as fever, chills, cough, chest pain, GI changes, or breast concerns.    MEDICAL HISTORY:  Past Medical History:  Diagnosis Date  . Anemia   . Anxiety   . Arthritis   . [facility]   right breast  . Family history of adverse reaction to anesthesia    sister has same issues  . GERD (gastroesophageal reflux disease)   .  PONV (postoperative nausea and vomiting)     SURGICAL HISTORY: Past Surgical History:  Procedure Laterality Date  . BREAST RECONSTRUCTION WITH PLACEMENT OF TISSUE EXPANDER AND FLEX HD (ACELLULAR HYDRATED DERMIS) Bilateral 02/24/2019   Procedure: BREAST RECONSTRUCTION WITH PLACEMENT OF TISSUE EXPANDER AND FLEX HD (ACELLULAR HYDRATED DERMIS);  Surgeon: Wallace Going, DO;  Location: [facility];  Service: Plastics;  Laterality: Bilateral;  . BREAST SURGERY     left breast bx  .  ESOPHAGOGASTRODUODENOSCOPY (EGD) WITH ESOPHAGEAL DILATION    . EYE SURGERY     dart to the eye  (right)  . FACIAL FRACTURE SURGERY    . FRACTURE SURGERY    . KNEE SURGERY    . MASTECTOMY W/ SENTINEL NODE BIOPSY Bilateral 02/24/2019   Procedure: RIGHT MASTECTOMY WITH SENTINEL LYMPH NODE MAPPING AND LEFT PROPHYLACTIC MASTECTOMY;  Surgeon: Jovita Kussmaul, MD;  Location: [facility];  Service: General;  Laterality: Bilateral;  . REMOVAL OF BILATERAL TISSUE EXPANDERS WITH PLACEMENT OF BILATERAL BREAST IMPLANTS Bilateral 06/30/2019   Procedure: REMOVAL OF BILATERAL TISSUE EXPANDERS WITH PLACEMENT OF BILATERAL BREAST IMPLANTS;  Surgeon: Wallace Going, DO;  Location: [facility];  Service: Plastics;  Laterality: Bilateral;  . SHOULDER SURGERY      I have reviewed the social history and family history with the patient and they are unchanged from previous note.  ALLERGIES:  is allergic to codeine and tramadol.  MEDICATIONS:  Current Outpatient Medications  Medication Sig Dispense Refill  . acetaminophen (TYLENOL) 500 MG tablet Take 1,000 mg by mouth every 6 (six) hours as needed for moderate pain.    . Cholecalciferol (VITAMIN D3) 125 MCG (5000 UT) CAPS Take 5,000 Units by mouth daily.    . Cyanocobalamin (B-12) 2500 MCG TABS Take 2,500 mcg by mouth daily.    . cyclobenzaprine (FLEXERIL) 10 MG tablet Take 1 tablet (10 mg total) by mouth 2 (two) times daily as needed for muscle spasms. 30 tablet 0  . Diclofenac Sodium CR 100 MG 24 hr tablet Take 1 tablet (100 mg total) by mouth daily. 5 tablet 0  . docusate sodium (COLACE) 100 MG capsule Take 100 mg by mouth daily.    Marland Kitchen gabapentin (NEURONTIN) 300 MG capsule Take 1 capsule (300 mg total) by mouth 2 (two) times daily. 60 capsule 0  . ibuprofen (ADVIL,MOTRIN) 200 MG tablet Take 600 mg by mouth every 6 (six) hours as needed for moderate pain.     . Iron-Vitamin C 65-125 MG TABS Take 1 tablet by mouth daily.    Marland Kitchen letrozole (FEMARA) 2.5 MG  tablet Take 1 tablet (2.5 mg total) by mouth daily. 30 tablet 0  . LORazepam (ATIVAN) 0.5 MG tablet Take 1 tablet (0.5 mg total) by mouth every 8 (eight) hours as needed for anxiety. 15 tablet 0  . Multiple Vitamin (MULTIVITAMIN WITH MINERALS) TABS tablet Take 1 tablet by mouth daily.    Marland Kitchen omeprazole (PRILOSEC) 20 MG capsule Take 20 mg by mouth daily.    . ondansetron (ZOFRAN) 4 MG tablet Take 1 tablet (4 mg total) by mouth every 8 (eight) hours as needed for nausea or vomiting. 20 tablet 0  . potassium chloride SA (KLOR-CON) 20 MEQ tablet Take 1 tablet once daily on Mondays Wednesdays and Fridays 30 tablet 1  . predniSONE (DELTASONE) 5 MG tablet 5 tab x 2 days, 4 tab x 2 days, 3 tab x 2 days, 2 tab x 2 days, 1 tab x 2 days, stop 21 tablet  0  . prochlorperazine (COMPAZINE) 10 MG tablet Take 1 tablet (10 mg total) by mouth every 6 (six) hours as needed (Nausea or vomiting). 30 tablet 1  . venlafaxine XR (EFFEXOR-XR) 75 MG 24 hr capsule TAKE 1 CAPSULE (75 MG TOTAL) BY MOUTH DAILY WITH BREAKFAST. 90 capsule 1  . zolpidem (AMBIEN) 5 MG tablet Take 1 tablet (5 mg total) by mouth at bedtime as needed for sleep. 30 tablet 1   No current facility-administered medications for this visit.    PHYSICAL EXAMINATION:  There were no vitals filed for this visit. There were no vitals filed for this visit.  Patient appears well over the phone. No cough or conversational dyspnea. Speech is clear and fluent. Mood and affect appear normal for situation.   LABORATORY DATA:  No labs for this visit.   RADIOGRAPHIC STUDIES: I have personally reviewed the radiological images as listed and agreed with the findings in the report. No results found.   ASSESSMENT & PLAN: [name] is a 54 y.o. female with    1. Joint pain, likely related to AI -Started after she began anastrozole in 06/2019 which she discontinued. Pain persisted upon switching to exemestane in 10/2019.  -Tylenol is not effective. Gabapentin  was started which is also not completely effective.  -Pain is worse at night, she becomes very stiff and sleep is difficult. Pain improves everywhere except hands after she gets up. Some activities are limited.  -her ER+ is 10%, benefit of AI is likely not very high. However, she does not want to stop anti-estrogen therapy altogether -she is very reluctant to try tamoxifen due to risk of endometrial cancer and thrombosis, despite she has never had these issues.  -She prefers to try letrozole, although I discussed her side effects on anastrozole may return. She had hair loss, hot flashes. She will continue gabapentin (300 mg BID) and effexor -she will stop exemestane tomorrow for 3 weeks. She will then start letrozole daily -We will see her back in 6 weeks to evaluate her tolerance to letrozole.   2.Malignant neoplasm of upper-outer quadrant of right breast,invasive lobular carcinoma, stageIA,p(T1a,N1a,M0), ER10%+, PR-, HER2-Grade I -She was diagnosed in 12/2018. She is s/p b/l mastectomyon3/11/20. Pathology showed margins were negative, however she has 1/5 positive lymph nodes.  -She completed adjuvant chemo with TC every 3 weeks for 4 cycles.  -She underwent breast reconstruction with implant placements on 06/30/19 -I started her antiestrogen therapy with Anastrozole in 06/2019.  She tolerated poorly with significant hot flash, and joint stiffness. She stopped after 2 weeks and eventually started exemestane in 10/2019 after RT -S/p Adjuvant Radiation with Dr Sondra Come on 08/17/19 - 09/28/19.  -on breast cancer surveillance and adjuvant AI  PLAN: -Discontinue exemestane from now -In 3 weeks, start letrozole daily  -Lab, f/u in 6 weeks to evaluate tolerance to letrozole  No problem-specific Assessment & Plan notes found for this encounter.   No orders of the defined types were placed in this encounter.  All questions were answered. The patient knows to call the clinic with any problems,  questions or concerns. No barriers to learning were detected. I spent 16 minutes non-face-to-face time on today's call, more than 50% was on counseling.    Alla Feeling, NP 01/03/20

## 2020-01-04 ENCOUNTER — Telehealth: Payer: Self-pay | Admitting: Nurse Practitioner

## 2020-01-04 ENCOUNTER — Other Ambulatory Visit: Payer: Self-pay | Admitting: Hematology

## 2020-01-04 NOTE — Telephone Encounter (Signed)
Scheduled appt per 1/18 los.  Sent a message to HIM pool to get a calendar mailed out. 

## 2020-01-05 ENCOUNTER — Telehealth: Payer: Self-pay | Admitting: Surgical

## 2020-01-05 NOTE — Telephone Encounter (Signed)

## 2020-01-06 ENCOUNTER — Other Ambulatory Visit: Payer: Self-pay

## 2020-01-06 ENCOUNTER — Encounter: Payer: Self-pay | Admitting: Surgical

## 2020-01-06 ENCOUNTER — Ambulatory Visit: Payer: BC Managed Care – PPO | Admitting: Surgical

## 2020-01-06 VITALS — BP 118/81 | HR 80 | Temp 97.8°F | Ht 63.0 in | Wt 183.0 lb

## 2020-01-06 DIAGNOSIS — Z9889 Other specified postprocedural states: Secondary | ICD-10-CM

## 2020-01-06 DIAGNOSIS — C50411 Malignant neoplasm of upper-outer quadrant of right female breast: Secondary | ICD-10-CM

## 2020-01-06 DIAGNOSIS — Z170 Estrogen receptor positive status [ER+]: Secondary | ICD-10-CM | POA: Diagnosis not present

## 2020-01-06 NOTE — Progress Notes (Addendum)
Subjective:     Patient ID: [name], female    DOB: [birth year], 54 y.o.   MRN: [id number]  Chief Complaint  Patient presents with  . Follow-up    3 mos for breast reconstruction    HPI: The patient is a 54 y.o. female here for follow-up after bilateral breast reconstruction status post mastectomies.  She has history of radiation of the right breast.  She is just over 6 months postop.  She underwent chemotherapy from 04/01/2019 until 06/03/2019.   She had her expander to implant exchange surgery on 06/30/2019. She had radiation therapy from 08/17/2019 until 09/28/2019.   Her incisions are well-healed.  Her breasts are fairly symmetric.  The left breast is a little bit more ptotic than the right.  There is no overt sign of radiation damage to the right breast other than skin tightening.  No wounds noted.  No erythema noted.  She does notice some asymmetry in the left breast where she has a little bit of extra tissue/adipose tissue causing a slight dogear and protrusion of this medial area.  She reports she is having some difficulty finding bras that fit her well so she has continued to wear sports bra.    Review of Systems  Constitutional: Negative.   Respiratory: Negative.   Cardiovascular: Negative.   Gastrointestinal: Negative.   Skin: Negative.    Objective:   Vital Signs BP 118/81 (BP Location: Left Arm, Patient Position: Sitting, Cuff Size: Normal)   Pulse 80   Temp 97.8 F (36.6 C) (Temporal)   Ht 5\' 3"  (1.6 m)   Wt 183 lb (83 kg)   SpO2 100%   BMI 32.42 kg/m  Vital Signs and Nursing Note Reviewed Chaperone present Physical Exam  Constitutional: She is oriented to person, place, and time and well-developed, well-nourished, and in no distress.  HENT:  Head: Normocephalic and atraumatic.  Cardiovascular: Normal rate.  Pulmonary/Chest: Effort normal.    Musculoskeletal:        General: Normal range of motion.  Neurological: She is alert and oriented to  person, place, and time. Gait normal.  Skin: Skin is warm and dry. No rash noted. She is not diaphoretic. No erythema. No pallor.  Psychiatric: Mood and affect normal.    Assessment/Plan:     ICD-10-CM   1. Malignant neoplasm of upper-outer quadrant of right breast in female, estrogen receptor positive ([place])  C50.411    Z17.0   2. S/P breast reconstruction  [phone]    Pictures were obtained to the patient today, she may benefit from liposuction of medial left breast as well as excision of excess tissue for dogear. She may also benefit from liposuction of abdomen or axilla and Lipofilling of left breast for contour abnormality.  The left breast implant is slightly more ptotic than the right and she has a slight divot at the superior pole of her left breast causing some asymmetry.   Overall she is very pleased with her progress so far.  She is approximately 3 months post radiation.  Prescription for second nature provided for patient to have assistance with finding a bra that fits her better.  Will consult with Dr. Marla Roe in regards to patient's photos and determine plan for patient's next steps.  Informed patient that we will call her with updated plan.  Pictures were obtained of the patient and placed in the chart with the patient's or guardian's permission.  Call with any questions or concerns.   25 minutes  in total was spent speaking with patient, evaluating patient and completing chart review/note writing.  Carola Rhine Nadege Carriger, PA-C 01/06/2020, 1:24 PM

## 2020-01-13 ENCOUNTER — Telehealth: Payer: Self-pay | Admitting: *Deleted

## 2020-01-13 NOTE — Telephone Encounter (Signed)
OK to proceed with colonoscopy now. Thank you.

## 2020-01-13 NOTE — Telephone Encounter (Signed)
Dr. Tarri Glenn,  This pt is coming in for a PV on 01-14-20.  Her colonoscopy is 01-27-20.  Per Dr. Ardis Hughs, we are to ask the MD if they would like to proceed with the colonoscopy if the patient has been treated for cancer within the past year.  [name] was treated for breast cancer in 2020- she had a mastectomy and is post chemo and radiation.    Is she ok to proceed with her colonoscopy?   Thanks, [employer]

## 2020-01-13 NOTE — Telephone Encounter (Signed)
noted 

## 2020-01-14 ENCOUNTER — Other Ambulatory Visit: Payer: Self-pay

## 2020-01-14 ENCOUNTER — Ambulatory Visit (AMBULATORY_SURGERY_CENTER): Payer: Self-pay | Admitting: *Deleted

## 2020-01-14 VITALS — Temp 97.0°F | Ht 63.0 in | Wt 177.2 lb

## 2020-01-14 DIAGNOSIS — Z1211 Encounter for screening for malignant neoplasm of colon: Secondary | ICD-10-CM

## 2020-01-14 DIAGNOSIS — Z01818 Encounter for other preprocedural examination: Secondary | ICD-10-CM

## 2020-01-14 MED ORDER — PLENVU 140 G PO SOLR: 140.0000 g | Freq: Once | ORAL | 0 refills | Status: AC

## 2020-01-14 NOTE — Progress Notes (Signed)

## 2020-01-20 ENCOUNTER — Encounter: Payer: Self-pay | Admitting: Gastroenterology

## 2020-01-24 ENCOUNTER — Ambulatory Visit (INDEPENDENT_AMBULATORY_CARE_PROVIDER_SITE_OTHER): Payer: BC Managed Care – PPO

## 2020-01-24 ENCOUNTER — Other Ambulatory Visit: Payer: Self-pay | Admitting: Gastroenterology

## 2020-01-24 ENCOUNTER — Other Ambulatory Visit: Payer: Self-pay

## 2020-01-24 DIAGNOSIS — Z1159 Encounter for screening for other viral diseases: Secondary | ICD-10-CM

## 2020-01-25 ENCOUNTER — Ambulatory Visit: Payer: BC Managed Care – PPO | Admitting: Neurology

## 2020-01-25 ENCOUNTER — Other Ambulatory Visit: Payer: Self-pay | Admitting: Nurse Practitioner

## 2020-01-25 LAB — SARS CORONAVIRUS 2 (TAT 6-24 HRS): SARS Coronavirus 2: NEGATIVE

## 2020-01-26 ENCOUNTER — Encounter: Payer: Self-pay | Admitting: Gastroenterology

## 2020-01-26 ENCOUNTER — Other Ambulatory Visit: Payer: Self-pay

## 2020-01-26 ENCOUNTER — Ambulatory Visit (AMBULATORY_SURGERY_CENTER): Payer: BC Managed Care – PPO | Admitting: Gastroenterology

## 2020-01-26 VITALS — BP 139/57 | HR 74 | Temp 97.7°F | Resp 13 | Ht 63.0 in | Wt 177.0 lb

## 2020-01-26 DIAGNOSIS — D122 Benign neoplasm of ascending colon: Secondary | ICD-10-CM | POA: Diagnosis not present

## 2020-01-26 DIAGNOSIS — Z1211 Encounter for screening for malignant neoplasm of colon: Secondary | ICD-10-CM

## 2020-01-26 MED ORDER — SODIUM CHLORIDE 0.9 % IV SOLN: 500.0000 mL | INTRAVENOUS | Status: DC

## 2020-01-26 NOTE — Progress Notes (Signed)
Temp by Ledora Bottcher by KA

## 2020-01-26 NOTE — Patient Instructions (Signed)
Handout on polyps given to you today    YOU HAD AN ENDOSCOPIC PROCEDURE TODAY AT [facility]:   Refer to the procedure report that was given to you for any specific questions about what was found during the examination.  If the procedure report does not answer your questions, please call your gastroenterologist to clarify.  If you requested that your care partner not be given the details of your procedure findings, then the procedure report has been included in a sealed envelope for you to review at your convenience later.  YOU SHOULD EXPECT: Some feelings of bloating in the abdomen. Passage of more gas than usual.  Walking can help get rid of the air that was put into your GI tract during the procedure and reduce the bloating. If you had a lower endoscopy (such as a colonoscopy or flexible sigmoidoscopy) you may notice spotting of blood in your stool or on the toilet paper. If you underwent a bowel prep for your procedure, you may not have a normal bowel movement for a few days.  Please Note:  You might notice some irritation and congestion in your nose or some drainage.  This is from the oxygen used during your procedure.  There is no need for concern and it should clear up in a day or so.  SYMPTOMS TO REPORT IMMEDIATELY:   Following lower endoscopy (colonoscopy or flexible sigmoidoscopy):  Excessive amounts of blood in the stool  Significant tenderness or worsening of abdominal pains  Swelling of the abdomen that is new, acute  Fever of 100F or higher  For urgent or emergent issues, a gastroenterologist can be reached at any hour by calling [phone].   DIET:  We do recommend a small meal at first, but then you may proceed to your regular diet.  Drink plenty of fluids but you should avoid alcoholic beverages for 24 hours.  ACTIVITY:  You should plan to take it easy for the rest of today and you should NOT DRIVE or use heavy machinery until tomorrow (because of the  sedation medicines used during the test).    FOLLOW UP: Our staff will call the number listed on your records 48-72 hours following your procedure to check on you and address any questions or concerns that you may have regarding the information given to you following your procedure. If we do not reach you, we will leave a message.  We will attempt to reach you two times.  During this call, we will ask if you have developed any symptoms of COVID 19. If you develop any symptoms (ie: fever, flu-like symptoms, shortness of breath, cough etc.) before then, please call [phone].  If you test positive for Covid 19 in the 2 weeks post procedure, please call and report this information to [place].    If any biopsies were taken you will be contacted by phone or by letter within the next 1-3 weeks.  Please call us at [phone] if you have not heard about the biopsies in 3 weeks.    SIGNATURES/CONFIDENTIALITY: You and/or your care partner have signed paperwork which will be entered into your electronic medical record.  These signatures attest to the fact that that the information above on your After Visit Summary has been reviewed and is understood.  Full responsibility of the confidentiality of this discharge information lies with you and/or your care-partner.

## 2020-01-26 NOTE — Op Note (Signed)
[facility] Patient Name: [name] Procedure Date: 01/26/2020 1:58 PM MRN: [id number] Endoscopist: Thornton Park MD, MD Age: 54 Referring MD:  Date of Birth: [birth year] Gender: Female Account #: [id number] Procedure:                Colonoscopy Indications:              Screening for colorectal malignant neoplasm, This                            is the patient's first colonoscopy                           No known family history of colon cancer or polyps Medicines:                Monitored Anesthesia Care Procedure:                Pre-Anesthesia Assessment:                           - Prior to the procedure, a History and Physical                            was performed, and patient medications and                            allergies were reviewed. The patient's tolerance of                            previous anesthesia was also reviewed. The risks                            and benefits of the procedure and the sedation                            options and risks were discussed with the patient.                            All questions were answered, and informed consent                            was obtained. Prior Anticoagulants: The patient has                            taken no previous anticoagulant or antiplatelet                            agents. ASA Grade Assessment: II - A patient with                            mild systemic disease. After reviewing the risks                            and benefits, the patient was deemed in  satisfactory condition to undergo the procedure.                           After obtaining informed consent, the colonoscope                            was passed under direct vision. Throughout the                            procedure, the patient's blood pressure, pulse, and                            oxygen saturations were monitored continuously. The                            Colonoscope was  introduced through the anus and                            advanced to the 2 cm into the ileum. A second                            forward view of the right colon was performed. The                            colonoscopy was performed with difficulty due to                            poor bowel prep. The patient tolerated the                            procedure well. The quality of the bowel                            preparation was poor. Scope In: 2:07:41 PM Scope Out: 2:29:34 PM Scope Withdrawal Time: 0 hours 11 minutes 19 seconds  Total Procedure Duration: 0 hours 21 minutes 53 seconds  Findings:                 The perianal and digital rectal examinations were                            normal.                           A 1 mm polyp was found in the proximal ascending                            colon. The polyp was sessile. The polyp was removed                            with a cold snare. Resection and retrieval were                            complete. Estimated blood loss was minimal.  Stool was found in the entire colon, interfering                            with visualization of small and flat polyps. Fluid                            aspiration was performed.                           The exam was otherwise without abnormality on                            direct and retroflexion views. Complications:            No immediate complications. Estimated blood loss:                            Minimal. Estimated Blood Loss:     Estimated blood loss was minimal. Impression:               - Preparation of the colon was poor.                           - One 1 mm polyp in the proximal ascending colon,                            removed with a cold snare. Resected and retrieved.                           - Stool in the entire examined colon. Fluid                            aspiration performed.                           - The examination was otherwise normal  on direct                            and retroflexion views. Recommendation:           - Patient has a contact number available for                            emergencies. The signs and symptoms of potential                            delayed complications were discussed with the                            patient. Return to normal activities tomorrow.                            Written discharge instructions were provided to the                            patient.                           -  Resume previous diet.                           - Continue present medications.                           - Await pathology results.                           - Repeat colonoscopy in 2 years because the bowel                            preparation was poor. Two day prep recommended at                            that time. Thornton Park MD, MD 01/26/2020 2:50:02 PM This report has been signed electronically.

## 2020-01-26 NOTE — Progress Notes (Signed)
Called to room to assist during endoscopic procedure.  Patient ID and intended procedure confirmed with present staff. Received instructions for my participation in the procedure from the performing physician.  

## 2020-01-26 NOTE — Progress Notes (Signed)
PT taken to PACU. Monitors in place. VSS. Report given to RN. 

## 2020-01-27 ENCOUNTER — Encounter: Payer: BC Managed Care – PPO | Admitting: Gastroenterology

## 2020-01-28 ENCOUNTER — Telehealth: Payer: Self-pay | Admitting: *Deleted

## 2020-01-28 ENCOUNTER — Telehealth: Payer: Self-pay

## 2020-01-28 NOTE — Telephone Encounter (Signed)
  Follow up Call-  Call back number 01/26/2020  Post procedure Call Back phone  # [phone]  Permission to leave phone message Yes  Some recent data might be hidden     No answer

## 2020-01-28 NOTE — Telephone Encounter (Signed)
Follow up call made, no answer, left message 

## 2020-02-01 ENCOUNTER — Encounter: Payer: Self-pay | Admitting: Gastroenterology

## 2020-02-02 ENCOUNTER — Other Ambulatory Visit: Payer: Self-pay | Admitting: Hematology

## 2020-02-14 ENCOUNTER — Inpatient Hospital Stay: Payer: BC Managed Care – PPO | Admitting: Nurse Practitioner

## 2020-02-14 ENCOUNTER — Telehealth: Payer: Self-pay | Admitting: *Deleted

## 2020-02-14 ENCOUNTER — Telehealth: Payer: Self-pay | Admitting: Hematology

## 2020-02-14 ENCOUNTER — Telehealth: Payer: Self-pay | Admitting: Nurse Practitioner

## 2020-02-14 ENCOUNTER — Inpatient Hospital Stay: Payer: BC Managed Care – PPO | Attending: Hematology

## 2020-02-14 NOTE — Telephone Encounter (Signed)
Called to make pt aware of missed appt today. Pt stated that she called to make aware that appt needed to be rescheduled due to medical needs of child. Message sent to scheduling to call pt and verify what date and time is suitable.

## 2020-02-14 NOTE — Progress Notes (Deleted)
[facility]   Telephone:([phone]) [phone] [phone]   Clinic Follow up Note   Patient Care Team: Merwyn Katos as PCP - General (Physician Assistant) Jovita Kussmaul, MD as Consulting Physician (General Surgery) Truitt Merle, MD as Consulting Physician (Hematology) Gery Pray, MD as Consulting Physician (Radiation Oncology) Rockwell Germany, RN as Oncology Nurse Navigator Mauro Kaufmann, RN as Oncology Nurse Navigator Alla Feeling, NP as Nurse Practitioner (Nurse Practitioner) 02/14/2020  CHIEF COMPLAINT: F/u right breast cancer   SUMMARY OF ONCOLOGIC HISTORY: Oncology History Overview Note  Cancer Staging Malignant neoplasm of upper-outer quadrant of right breast in female, estrogen receptor positive ([place]) Staging form: Breast, AJCC 8th Edition - Clinical stage from 01/15/2019: Stage IA (cT1b, cN0, cM0, G1, ER+, PR-, HER2-) - Signed by Truitt Merle, MD on 01/26/2019 - Pathologic stage from 02/24/2019: Stage IB (pT1a, pN1a, cM0, G1, ER+, PR-, HER2-) - Signed by Truitt Merle, MD on 03/18/2019     Malignant neoplasm of upper-outer quadrant of right breast in female, estrogen receptor positive ([place])  01/15/2019 Cancer Staging   Staging form: Breast, AJCC 8th Edition - Clinical stage from 01/15/2019: Stage IA (cT1b, cN0, cM0, G1, ER+, PR-, HER2-) - Signed by Truitt Merle, MD on 01/26/2019   01/15/2019 Mammogram   Diagnostic Mammogram 01/15/19  IMPRESSION Targeted ultrasound is performed, showing right breast 10 o'clock 5 cm from the nipple hypoechoic round mass measuring 0.7 x 0.5 x 0.6 cm. Adjacent to it there is a probable complicated cyst measuring 4 mm. There is no evidence of right axillary lymphadenopathy.   01/15/2019 Initial Biopsy   Diagnosis 01/15/19  Breast, right, needle core biopsy, upper outer - INVASIVE AND IN SITU MAMMARY CARCINOMA WITH CALCIFICATIONS. - SEE COMMENT. 1 of 3 FINAL for Puglia, Yasmen P ([id number]) Microscopic Comment The  carcinoma appears grade I. The malignant cells are positive for cytokeratin AE1/AE3 and negative for E-cadherin. A breast prognostic profile will be performed and the results reported separately. Dr Vicente Males has reviewed the case and concurs with this interpretation. The results are called to [facility] on 01/18/2019. (JBK:ah:ecj 01/18/19)   01/15/2019 Receptors her2   PROGNOSTIC INDICATORS Results: IMMUNOHISTOCHEMICAL AND MORPHOMETRIC ANALYSIS PERFORMED MANUALLY The tumor cells are NEGATIVE for Her2 (0). Estrogen Receptor: 10%, POSITIVE, MODERATE STAINING INTENSITY Progesterone Receptor: 0%, NEGATIVE Proliferation Marker Ki67: <1%   01/21/2019 Initial Diagnosis   Malignant neoplasm of upper-outer quadrant of right breast in female, estrogen receptor positive ([place])   01/29/2019 Breast MRI   IMPRESSION: 1. The biopsy-proven site of malignancy in the upper-outer quadrant of the right breast resides centrally with in an area of suspicious non mass enhancement spanning 6-7 cm.  2. There is a 6 mm suspicious enhancing mass in the lower-inner quadrant of the right breast, posterior depth.  3. There is a 9 mm indeterminate enhancing mass in the lower outer quadrant of the right breast.  4.  No evidence of malignancy in the left breast.   02/24/2019 Surgery   RIGHT MASTECTOMY WITH SENTINEL LYMPH NODE MAPPING AND LEFT PROPHYLACTIC MASTECTOMY by Dr. Marlou Starks  BREAST RECONSTRUCTION WITH PLACEMENT OF TISSUE EXPANDER by Dr. Marla Roe  02/24/19    02/24/2019 Pathology Results   Diagnosis 1. Breast, simple mastectomy, Left - BENIGN BREAST PARENCHYMA WITH FIBROCYSTIC CHANGE. - NEGATIVE FOR CARCINOMA. 2. Breast, simple mastectomy, Right - LOBULAR CARCINOMA IN SITU, INTERMEDIATE TO HIGH NUCLEAR GRADE. SEE NOTE. - NO EVIDENCE OF RESIDUAL INVASIVE CARCINOMA. - RESECTION MARGINS ARE  NEGATIVE FOR IN SITU OR INVASIVE CARCINOMA. - BIOPSY SITE CHANGES. - SEE ONCOLOGY TABLE. 3.  Lymph node, sentinel, biopsy, Right #1 - IMMUNOHISTOCHEMICAL STAIN FOR AE1/AE3 IS NEGATIVE FOR CARCINOMA (0/1). 4. Lymph node, sentinel, biopsy, Right - IMMUNOHISTOCHEMICAL STAIN FOR AE1/AE3 IS NEGATIVE FOR CARCINOMA (0/1). 5. Lymph node, sentinel, biopsy, Right #2 - IMMUNOHISTOCHEMICAL STAIN FOR AE1/AE3 IS NEGATIVE FOR CARCINOMA (0/1). 6. Lymph node, sentinel, biopsy, Right - IMMUNOHISTOCHEMICAL STAIN FOR AE1/AE3 IS NEGATIVE FOR CARCINOMA (0/1). 7. Lymph node, sentinel, biopsy, Right - METASTATIC CARCINOMA TO ONE LYMPH NODE (1/1) (CONFIRMED WITH IMMUNOHISTOCHEMICAL STAIN FOR AE1/AE3) - FOCUS OF METASTATIC CARCINOMA MEASURES JUST OVER 0.2 CM IN GREATEST DIMENSION - NO EVIDENCE OF EXTRANODAL EXTENSION   02/24/2019 Cancer Staging   Staging form: Breast, AJCC 8th Edition - Pathologic stage from 02/24/2019: Stage IB (pT1a, pN1a, cM0, G1, ER+, PR-, HER2-) - Signed by Truitt Merle, MD on 03/18/2019   04/01/2019 - 06/03/2019 Chemotherapy   adjuvant Taxol and Carboplatin every 3 weeks for 4 cycles starting in 2 weeks starting 04/01/19-06/03/19   06/2019 -  Anti-estrogen oral therapy   Anastrozole 54m daily starting 06/2019. Held after 2 weeks due to hot flashes and joint pain and RT.  Switched to Exemestane in 10/2019   06/30/2019 Surgery   REMOVAL OF BILATERAL TISSUE EXPANDERS WITH PLACEMENT OF BILATERAL BREAST IMPLANTS by Dr DMarla Roe 06/30/19    08/17/2019 - 09/28/2019 Radiation Therapy   Adjuvant Radiaiton with Dr KSondra Come Site Technique Total Dose (Gy) Dose per Fx (Gy) Completed Fx Beam Energies  Thorax: CW_Rt_Bst_boost Electron 10/10 2 5/5 6E  Thorax: CW_Rt 3D 50/50 2 25/25 10X, 15X, 6X  Thorax: CW_Rt_axilla Complex 50/50 2 25/25 10X     12/23/2019 Survivorship   SCP delivered by LCira Rue NP      CURRENT THERAPY:  Anastrozole 129mdaily starting 06/2019, held after 2 weeks due to hot flashes and joint pain and RT. Switched to Exemestane in 10/2019  INTERVAL HISTORY: Ms. PeDonneturns for  f/u as scheduled. She had a virtual visit in 01/03/20 for generalized joint/body pain. She was switched from exemestane to letrozole in 01/2020.    REVIEW OF SYSTEMS:   Constitutional: Denies fevers, chills or abnormal weight loss Eyes: Denies blurriness of vision Ears, nose, mouth, throat, and face: Denies mucositis or sore throat Respiratory: Denies cough, dyspnea or wheezes Cardiovascular: Denies palpitation, chest discomfort or lower extremity swelling Gastrointestinal:  Denies nausea, heartburn or change in bowel habits Skin: Denies abnormal skin rashes Lymphatics: Denies new lymphadenopathy or easy bruising Neurological:Denies numbness, tingling or new weaknesses Behavioral/Psych: Mood is stable, no new changes  All other systems were reviewed with the patient and are negative.  MEDICAL HISTORY:  Past Medical History:  Diagnosis Date  . Anemia   . Anxiety   . Arthritis   . [facility]   [facility]  . Family history of adverse reaction to anesthesia    sister has same issues  . GERD (gastroesophageal reflux disease)   . Neuromuscular disorder ([facility])    neuropathy  . PONV (postoperative nausea and vomiting)     SURGICAL HISTORY: Past Surgical History:  Procedure Laterality Date  . BREAST RECONSTRUCTION WITH PLACEMENT OF TISSUE EXPANDER AND FLEX HD (ACELLULAR HYDRATED DERMIS) Bilateral 02/24/2019   Procedure: BREAST RECONSTRUCTION WITH PLACEMENT OF TISSUE EXPANDER AND FLEX HD (ACELLULAR HYDRATED DERMIS);  Surgeon: DiWallace GoingDO;  [facility] [facility] [place];  Laterality: Bilateral;  . BREAST SURGERY  left breast bx  . ESOPHAGOGASTRODUODENOSCOPY (EGD) WITH ESOPHAGEAL DILATION    . EYE SURGERY     dart to the eye  (right)  . FACIAL FRACTURE SURGERY    . FRACTURE SURGERY    . KNEE SURGERY    . MASTECTOMY W/ SENTINEL NODE BIOPSY Bilateral 02/24/2019   Procedure: RIGHT MASTECTOMY WITH SENTINEL LYMPH NODE MAPPING AND LEFT PROPHYLACTIC MASTECTOMY;   Surgeon: Jovita Kussmaul, MD;  Location: [facility];  Service: General;  Laterality: Bilateral;  . REMOVAL OF BILATERAL TISSUE EXPANDERS WITH PLACEMENT OF BILATERAL BREAST IMPLANTS Bilateral 06/30/2019   Procedure: REMOVAL OF BILATERAL TISSUE EXPANDERS WITH PLACEMENT OF BILATERAL BREAST IMPLANTS;  Surgeon: Wallace Going, DO;  Location: [facility];  Service: Plastics;  Laterality: Bilateral;  . SHOULDER SURGERY      I have reviewed the social history and family history with the patient and they are unchanged from previous note.  ALLERGIES:  is allergic to codeine and tramadol.  MEDICATIONS:  Current Outpatient Medications  Medication Sig Dispense Refill  . acetaminophen (TYLENOL) 500 MG tablet Take 1,000 mg by mouth every 6 (six) hours as needed for moderate pain.    . Cholecalciferol (VITAMIN D3) 125 MCG (5000 UT) CAPS Take 5,000 Units by mouth daily.    . Cyanocobalamin (B-12) 2500 MCG TABS Take 2,500 mcg by mouth daily.    . cyclobenzaprine (FLEXERIL) 10 MG tablet Take 1 tablet (10 mg total) by mouth 2 (two) times daily as needed for muscle spasms. 30 tablet 0  . Diclofenac Sodium CR 100 MG 24 hr tablet Take 1 tablet (100 mg total) by mouth daily. (Patient not taking: Reported on 01/14/2020) 5 tablet 0  . docusate sodium (COLACE) 100 MG capsule Take 100 mg by mouth daily.    Marland Kitchen gabapentin (NEURONTIN) 300 MG capsule TAKE 1 CAPSULE BY MOUTH TWICE A DAY 60 capsule 0  . ibuprofen (ADVIL,MOTRIN) 200 MG tablet Take 600 mg by mouth every 6 (six) hours as needed for moderate pain.     . Iron-Vitamin C 65-125 MG TABS Take 1 tablet by mouth daily.    Marland Kitchen letrozole (FEMARA) 2.5 MG tablet TAKE 1 TABLET BY MOUTH EVERY DAY 30 tablet 0  . LORazepam (ATIVAN) 0.5 MG tablet Take 1 tablet (0.5 mg total) by mouth every 8 (eight) hours as needed for anxiety. (Patient not taking: Reported on 01/06/2020) 15 tablet 0  . Multiple Vitamin (MULTIVITAMIN WITH MINERALS) TABS tablet Take 1 tablet by mouth  daily.    Marland Kitchen omeprazole (PRILOSEC) 20 MG capsule Take 20 mg by mouth daily.    . ondansetron (ZOFRAN) 4 MG tablet Take 1 tablet (4 mg total) by mouth every 8 (eight) hours as needed for nausea or vomiting. (Patient not taking: Reported on 01/06/2020) 20 tablet 0  . potassium chloride SA (KLOR-CON) 20 MEQ tablet Take 1 tablet once daily on Mondays Wednesdays and Fridays 30 tablet 1  . predniSONE (DELTASONE) 5 MG tablet 5 tab x 2 days, 4 tab x 2 days, 3 tab x 2 days, 2 tab x 2 days, 1 tab x 2 days, stop 21 tablet 0  . prochlorperazine (COMPAZINE) 10 MG tablet Take 1 tablet (10 mg total) by mouth every 6 (six) hours as needed (Nausea or vomiting). (Patient not taking: Reported on 01/14/2020) 30 tablet 1  . venlafaxine XR (EFFEXOR-XR) 75 MG 24 hr capsule TAKE 1 CAPSULE (75 MG TOTAL) BY MOUTH DAILY WITH BREAKFAST. 90 capsule 1  . zolpidem (AMBIEN) 5  MG tablet Take 1 tablet (5 mg total) by mouth at bedtime as needed for sleep. 30 tablet 1   No current facility-administered medications for this visit.    PHYSICAL EXAMINATION: ECOG PERFORMANCE STATUS: {CHL ONC ECOG PS:[id number]}  There were no vitals filed for this visit. There were no vitals filed for this visit.  GENERAL:alert, no distress and comfortable SKIN: skin color, texture, turgor are normal, no rashes or significant lesions EYES: normal, Conjunctiva are pink and non-injected, sclera clear OROPHARYNX:no exudate, no erythema and lips, buccal mucosa, and tongue normal  NECK: supple, thyroid normal size, non-tender, without nodularity LYMPH:  no palpable lymphadenopathy in the cervical, axillary or inguinal LUNGS: clear to auscultation and percussion with normal breathing effort HEART: regular rate & rhythm and no murmurs and no lower extremity edema ABDOMEN:abdomen soft, non-tender and normal bowel sounds Musculoskeletal:no cyanosis of digits and no clubbing  NEURO: alert & oriented x 3 with fluent speech, no focal motor/sensory  deficits  LABORATORY DATA:  I have reviewed the data as listed CBC Latest Ref Rng & Units 12/23/2019 12/18/2019 09/20/2019  WBC 4.0 - 10.5 K/uL 6.9 5.3 6.0  Hemoglobin 12.0 - 15.0 g/dL 12.1 12.0 12.0  Hematocrit 36.0 - 46.0 % 36.7 37.4 36.4  Platelets 150 - 400 K/uL 294 291 240     CMP Latest Ref Rng & Units 12/23/2019 12/18/2019 09/20/2019  Glucose 70 - 99 mg/dL 98 88 70  BUN 6 - 20 mg/dL '9 11 12  ' Creatinine 0.44 - 1.00 mg/dL 0.82 0.70 0.69  Sodium 135 - 145 mmol/L 142 141 142  Potassium 3.5 - 5.1 mmol/L 3.3(L) 3.6 3.7  Chloride 98 - 111 mmol/L 104 104 106  CO2 22 - 32 mmol/L '28 28 26  ' Calcium 8.9 - 10.3 mg/dL 9.8 9.6 9.8  Total Protein 6.5 - 8.1 g/dL 7.9 7.4 6.9  Total Bilirubin 0.3 - 1.2 mg/dL 0.4 0.4 0.3  Alkaline Phos 38 - 126 U/L 88 78 73  AST 15 - 41 U/L '19 22 16  ' ALT 0 - 44 U/L '19 23 14      ' RADIOGRAPHIC STUDIES: I have personally reviewed the radiological images as listed and agreed with the findings in the report. No results found.   ASSESSMENT & PLAN:  No problem-specific Assessment & Plan notes found for this encounter.   No orders of the defined types were placed in this encounter.  All questions were answered. The patient knows to call the clinic with any problems, questions or concerns. No barriers to learning was detected. I spent {CHL ONC TIME VISIT - [id number]} counseling the patient face to face. The total time spent in the appointment was {CHL ONC TIME VISIT - HHIDU:[id number]} and more than 50% was on counseling and review of test results     Alla Feeling, NP 02/14/20

## 2020-02-14 NOTE — Telephone Encounter (Signed)
Rescheduled appt per 3/1 sch msg. Pt is aware of new date and time.

## 2020-02-14 NOTE — Telephone Encounter (Signed)
Rescheduled appt per 3/1 los. Pt is aware of new appt time.

## 2020-02-23 ENCOUNTER — Other Ambulatory Visit: Payer: Self-pay | Admitting: Nurse Practitioner

## 2020-03-05 ENCOUNTER — Other Ambulatory Visit: Payer: Self-pay | Admitting: Hematology

## 2020-03-11 ENCOUNTER — Other Ambulatory Visit: Payer: Self-pay | Admitting: Hematology

## 2020-03-20 ENCOUNTER — Other Ambulatory Visit: Payer: BC Managed Care – PPO

## 2020-03-20 ENCOUNTER — Ambulatory Visit: Payer: BC Managed Care – PPO | Admitting: Hematology

## 2020-03-20 ENCOUNTER — Ambulatory Visit: Payer: BC Managed Care – PPO | Admitting: Nurse Practitioner

## 2020-03-22 ENCOUNTER — Inpatient Hospital Stay: Payer: BC Managed Care – PPO | Admitting: Nurse Practitioner

## 2020-03-22 ENCOUNTER — Other Ambulatory Visit: Payer: Self-pay

## 2020-03-22 ENCOUNTER — Inpatient Hospital Stay: Payer: BC Managed Care – PPO | Attending: Hematology

## 2020-03-22 ENCOUNTER — Encounter: Payer: Self-pay | Admitting: Nurse Practitioner

## 2020-03-22 VITALS — BP 120/88 | HR 89 | Temp 98.0°F | Resp 20 | Ht 63.0 in | Wt 183.8 lb

## 2020-03-22 DIAGNOSIS — Z79899 Other long term (current) drug therapy: Secondary | ICD-10-CM | POA: Diagnosis not present

## 2020-03-22 DIAGNOSIS — Z79811 Long term (current) use of aromatase inhibitors: Secondary | ICD-10-CM | POA: Diagnosis not present

## 2020-03-22 DIAGNOSIS — R232 Flushing: Secondary | ICD-10-CM | POA: Insufficient documentation

## 2020-03-22 DIAGNOSIS — M79631 Pain in right forearm: Secondary | ICD-10-CM | POA: Diagnosis not present

## 2020-03-22 DIAGNOSIS — Z170 Estrogen receptor positive status [ER+]: Secondary | ICD-10-CM

## 2020-03-22 DIAGNOSIS — E2839 Other primary ovarian failure: Secondary | ICD-10-CM

## 2020-03-22 DIAGNOSIS — F419 Anxiety disorder, unspecified: Secondary | ICD-10-CM | POA: Diagnosis not present

## 2020-03-22 DIAGNOSIS — M1990 Unspecified osteoarthritis, unspecified site: Secondary | ICD-10-CM | POA: Diagnosis not present

## 2020-03-22 DIAGNOSIS — Z923 Personal history of irradiation: Secondary | ICD-10-CM | POA: Insufficient documentation

## 2020-03-22 DIAGNOSIS — C50411 Malignant neoplasm of upper-outer quadrant of right female breast: Secondary | ICD-10-CM | POA: Diagnosis not present

## 2020-03-22 DIAGNOSIS — K219 Gastro-esophageal reflux disease without esophagitis: Secondary | ICD-10-CM | POA: Insufficient documentation

## 2020-03-22 DIAGNOSIS — M79643 Pain in unspecified hand: Secondary | ICD-10-CM | POA: Diagnosis not present

## 2020-03-22 LAB — CMP (CANCER CENTER ONLY)
ALT: 16 U/L (ref 0–44)
AST: 18 U/L (ref 15–41)
Albumin: 4 g/dL (ref 3.5–5.0)
Alkaline Phosphatase: 105 U/L (ref 38–126)
Anion gap: 12 (ref 5–15)
BUN: 9 mg/dL (ref 6–20)
CO2: 26 mmol/L (ref 22–32)
Calcium: 10.4 mg/dL — ABNORMAL HIGH (ref 8.9–10.3)
Chloride: 106 mmol/L (ref 98–111)
Creatinine: 0.81 mg/dL (ref 0.44–1.00)
GFR, Est AFR Am: >60 mL/min (ref >60)
GFR, Estimated: >60 mL/min (ref >60)
Glucose, Bld: 89 mg/dL (ref 70–99)
Potassium: 3.7 mmol/L (ref 3.5–5.1)
Sodium: 144 mmol/L (ref 135–145)
Total Bilirubin: 0.3 mg/dL (ref 0.3–1.2)
Total Protein: 7.9 g/dL (ref 6.5–8.1)

## 2020-03-22 LAB — CBC WITH DIFFERENTIAL (CANCER CENTER ONLY)
Abs Immature Granulocytes: 0.01 10*3/uL (ref 0.00–0.07)
Basophils Absolute: 0.1 10*3/uL (ref 0.0–0.1)
Basophils Relative: 1 %
Eosinophils Absolute: 0.1 10*3/uL (ref 0.0–0.5)
Eosinophils Relative: 2 %
HCT: 37.9 % (ref 36.0–46.0)
Hemoglobin: 12.3 g/dL (ref 12.0–15.0)
Immature Granulocytes: 0 %
Lymphocytes Relative: 16 %
Lymphs Abs: 1 10*3/uL (ref 0.7–4.0)
MCH: 28.8 pg (ref 26.0–34.0)
MCHC: 32.5 g/dL (ref 30.0–36.0)
MCV: 88.8 fL (ref 80.0–100.0)
Monocytes Absolute: 0.5 10*3/uL (ref 0.1–1.0)
Monocytes Relative: 9 %
Neutro Abs: 4.3 10*3/uL (ref 1.7–7.7)
Neutrophils Relative %: 72 %
Platelet Count: 289 10*3/uL (ref 150–400)
RBC: 4.27 MIL/uL (ref 3.87–5.11)
RDW: 13.1 % (ref 11.5–15.5)
WBC Count: 6 10*3/uL (ref 4.0–10.5)
nRBC: 0 % (ref 0.0–0.2)

## 2020-03-22 IMAGING — CT CT HEAD W/O CM
3 of 6 series · 13 of 47 positions shown, 16 images · non-contrast
Comparison: CT brain 11/05/2014

CLINICAL DATA: Headache with upper extremity numbness

EXAM:
CT HEAD WITHOUT CONTRAST
CT CERVICAL SPINE WITHOUT CONTRAST
TECHNIQUE: Multidetector CT imaging of the head and cervical spine was
performed following the standard protocol without intravenous
contrast. Multiplanar CT image reconstructions of the cervical spine
were also generated.

[Series 4: coronal soft tissue · coronal · 0.31mm/px · 3 of 82 slices shown]
[im 24/82  bone]
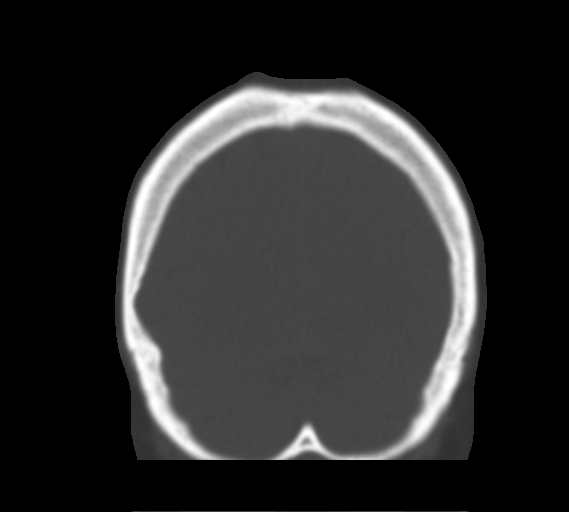
[im 35/82  bone]
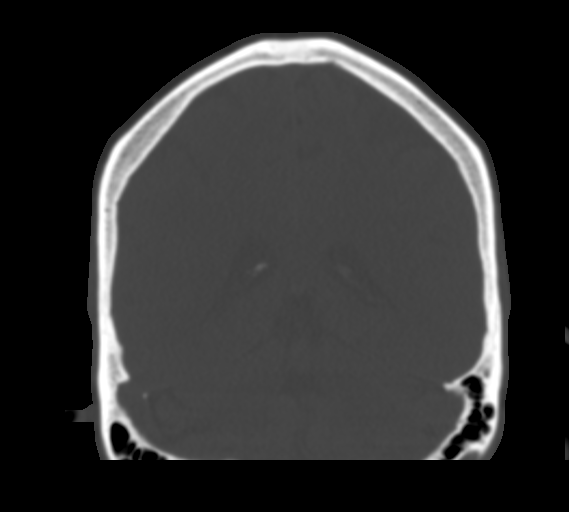
[im 47/82  bone]
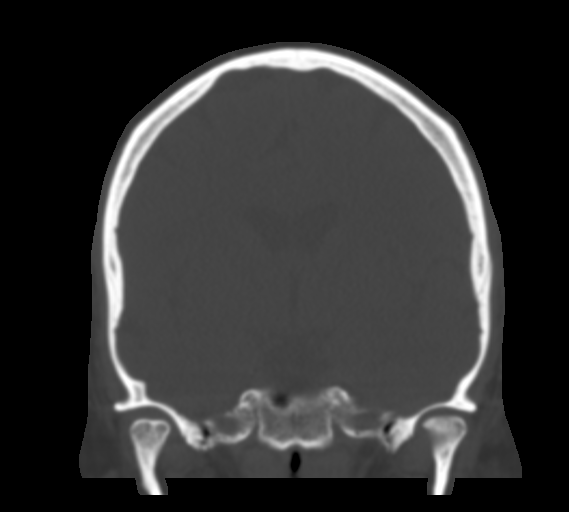

[Series 5: sagittal soft tissue · sagittal · 0.33mm/px · 5 of 53 slices shown, 6 images]
[im 9/53  bone]
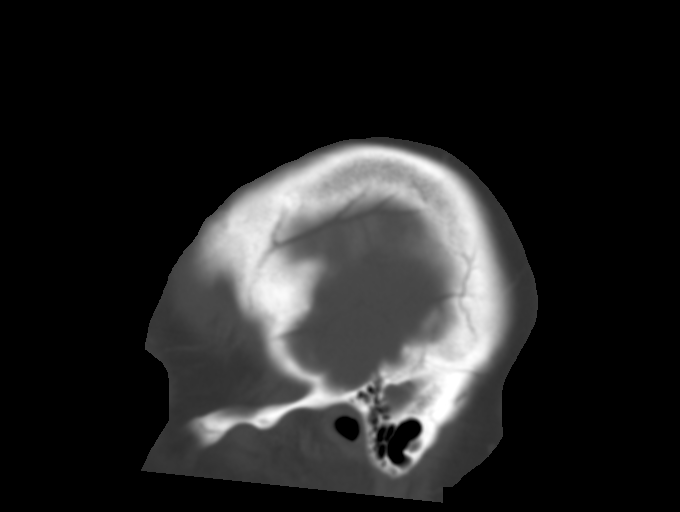
[im 18/53  bone]
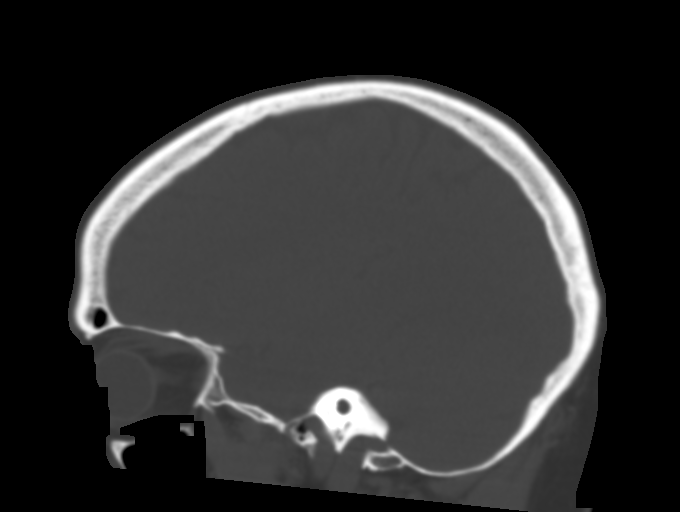
[im 27/53  brain]
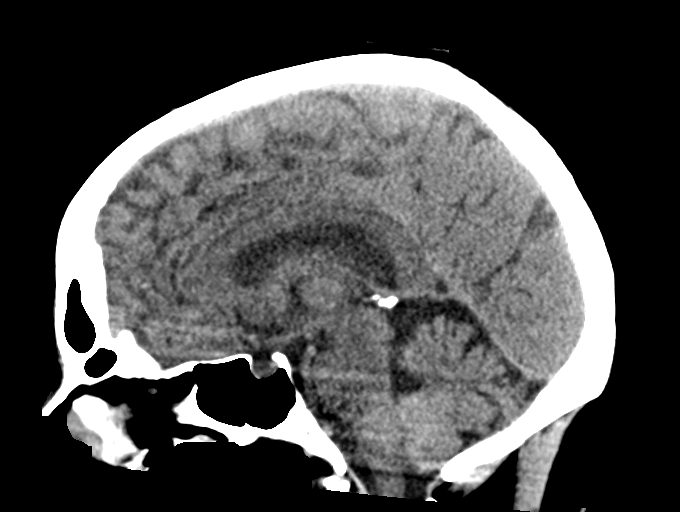
[im 27/53  bone]
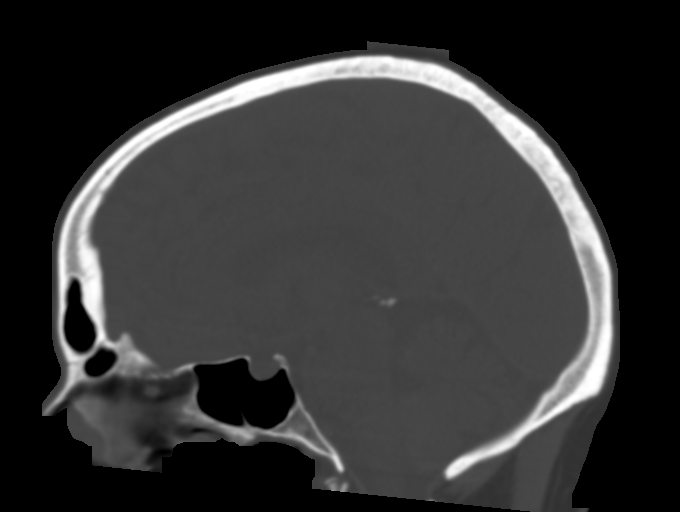
[im 35/53  bone]
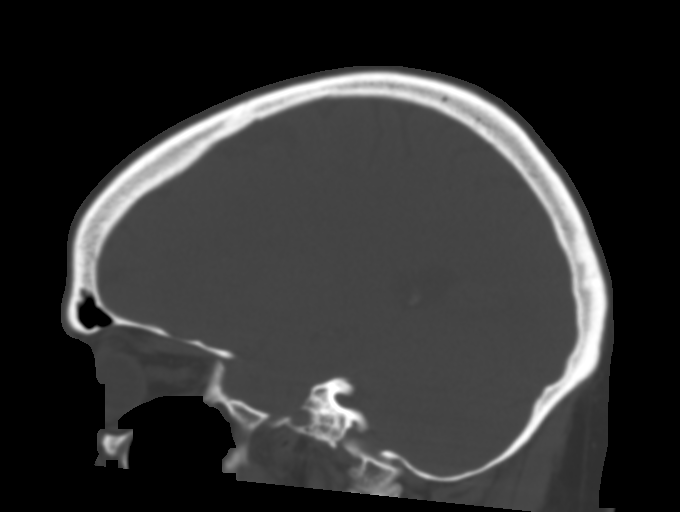
[im 44/53  bone]
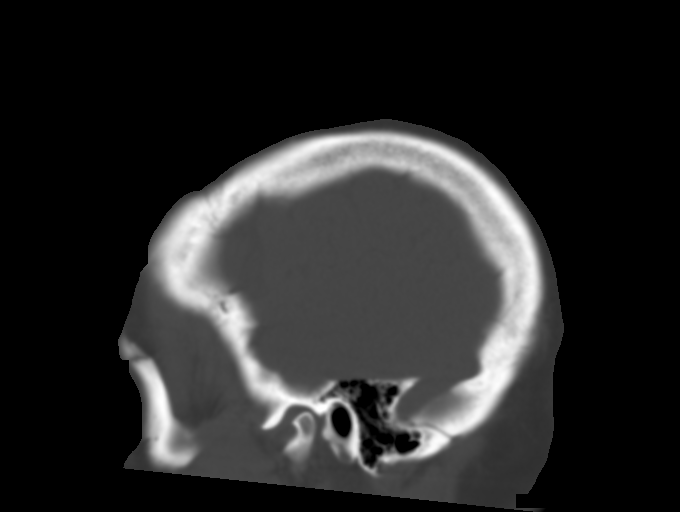

[Series 11: orthogonal bone · axial · 0.23mm/px · z∈[-284,-152]mm · 5 of 94 slices shown, 7 images]
[im 17/94  brain]
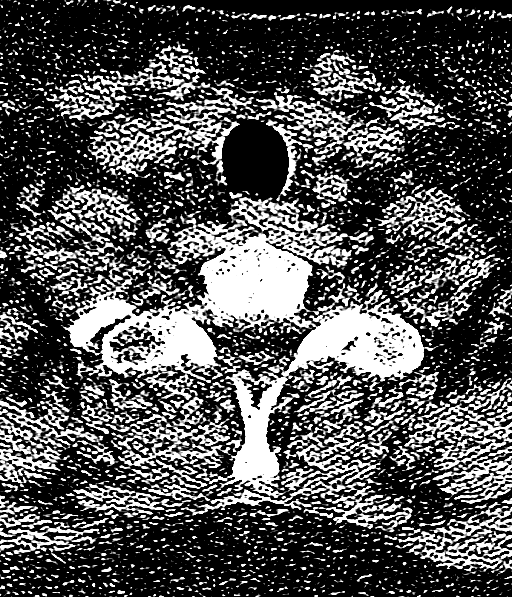
[im 17/94  bone]
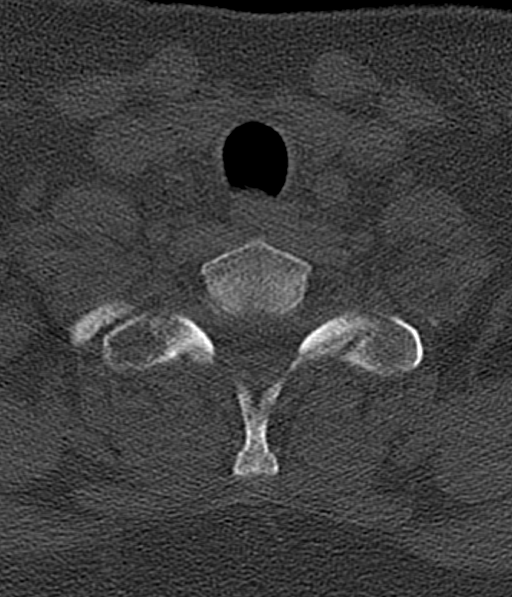
[im 34/94  bone]
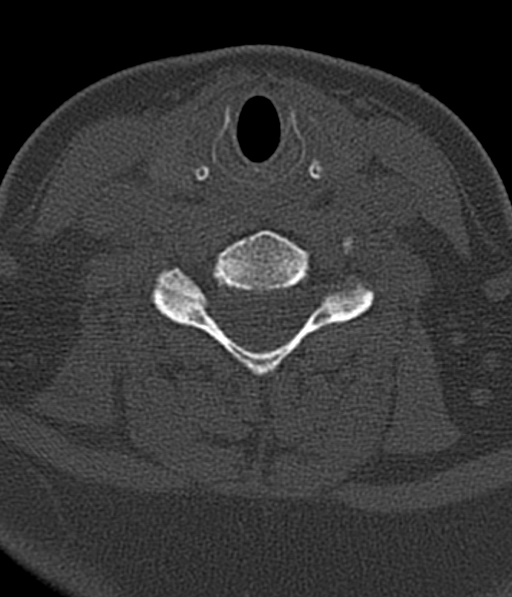
[im 51/94  bone]
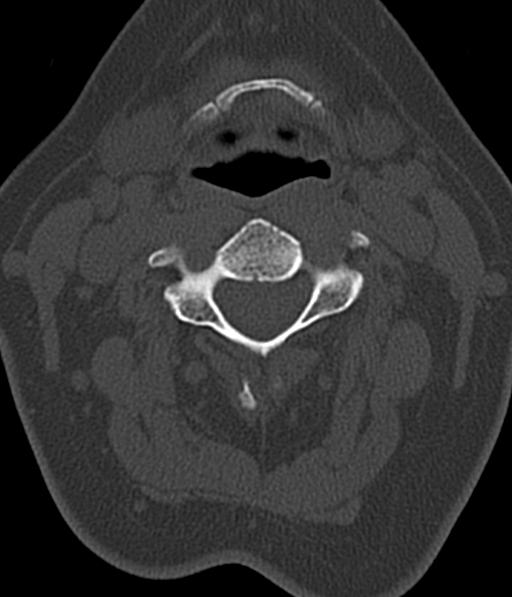
[im 68/94  bone]
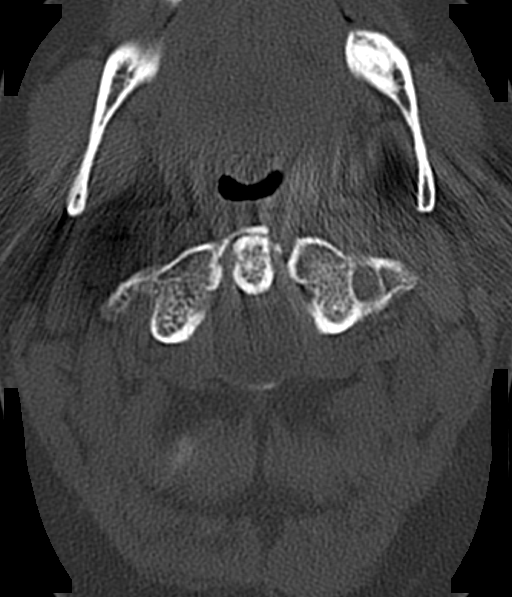
[im 85/94  brain]
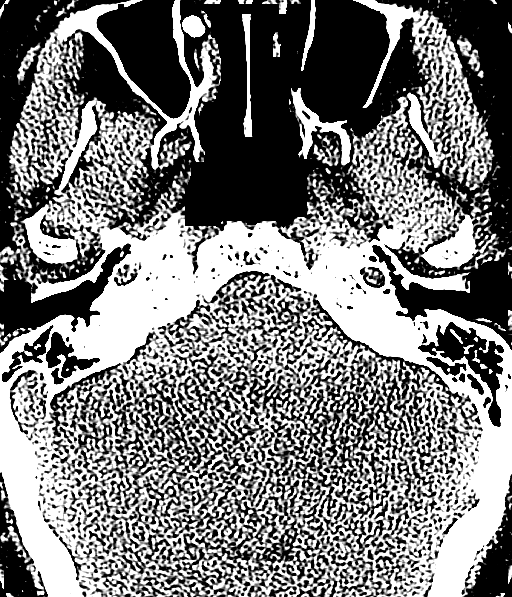
[im 85/94  bone]
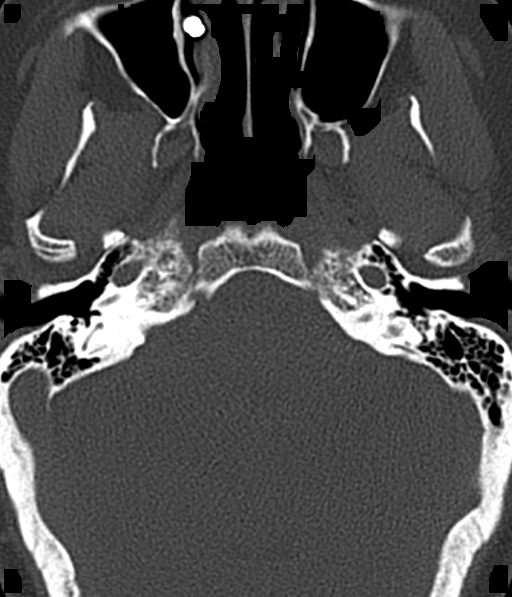

[13 of 47 positions shown; findings below may reference images not displayed]

FINDINGS: CT HEAD FINDINGS

Brain: No evidence of acute infarction, hemorrhage, hydrocephalus,
extra-axial collection or mass lesion/mass effect.

Vascular: No hyperdense vessel or unexpected calcification.

Skull: Normal. Negative for fracture or focal lesion.

Sinuses/Orbits: No acute finding.

Other: None

CT CERVICAL SPINE FINDINGS

Alignment: Normal.

Skull base and vertebrae: No acute fracture. No primary bone lesion
or focal pathologic process.

Soft tissues and spinal canal: No prevertebral fluid or swelling. No
visible canal hematoma.

Disc levels:  Mild degenerative changes C5-C6.

Upper chest: Negative.

Other: None
IMPRESSION: 1. Negative non contrasted CT appearance of the brain
2. No CT evidence for acute osseous abnormality

## 2020-03-22 NOTE — Progress Notes (Addendum)
[place]   [phone] [phone]   Clinic Follow up Note   Patient Care Team: Merwyn Katos as PCP - General (Physician Assistant) Jovita Kussmaul, MD as Consulting Physician (General Surgery) Truitt Merle, MD as Consulting Physician (Hematology) Gery Pray, MD as Consulting Physician (Radiation Oncology) Rockwell Germany, RN as Oncology Nurse Navigator Mauro Kaufmann, RN as Oncology Nurse Navigator Alla Feeling, NP as Nurse Practitioner (Nurse Practitioner) Date of Service: 03/22/2020   CHIEF COMPLAINT: F/u right breast cancer   SUMMARY OF ONCOLOGIC HISTORY: Oncology History Overview Note  Cancer Staging Malignant neoplasm of upper-outer quadrant of right breast in female, estrogen receptor positive ([place]) Staging form: Breast, AJCC 8th Edition - Clinical stage from 01/15/2019: Stage IA (cT1b, cN0, cM0, G1, ER+, PR-, HER2-) - Signed by Truitt Merle, MD on 01/26/2019 - Pathologic stage from 02/24/2019: Stage IB (pT1a, pN1a, cM0, G1, ER+, PR-, HER2-) - Signed by Truitt Merle, MD on 03/18/2019     Malignant neoplasm of upper-outer quadrant of right breast in female, estrogen receptor positive ([place])  01/15/2019 Cancer Staging   Staging form: Breast, AJCC 8th Edition - Clinical stage from 01/15/2019: Stage IA (cT1b, cN0, cM0, G1, ER+, PR-, HER2-) - Signed by Truitt Merle, MD on 01/26/2019   01/15/2019 Mammogram   Diagnostic Mammogram 01/15/19  IMPRESSION Targeted ultrasound is performed, showing right breast 10 o'clock 5 cm from the nipple hypoechoic round mass measuring 0.7 x 0.5 x 0.6 cm. Adjacent to it there is a probable complicated cyst measuring 4 mm. There is no evidence of right axillary lymphadenopathy.   01/15/2019 Initial Biopsy   Diagnosis 01/15/19  Breast, right, needle core biopsy, upper outer - INVASIVE AND IN SITU MAMMARY CARCINOMA WITH CALCIFICATIONS. - SEE COMMENT. 1 of 3 FINAL for [name] ([id number]) Microscopic  Comment The carcinoma appears grade I. The malignant cells are positive for cytokeratin AE1/AE3 and negative for E-cadherin. A breast prognostic profile will be performed and the results reported separately. Dr Vicente Males has reviewed the case and concurs with this interpretation. The results are called to [facility] on 01/18/2019. (JBK:ah:ecj 01/18/19)   01/15/2019 Receptors her2   PROGNOSTIC INDICATORS Results: IMMUNOHISTOCHEMICAL AND MORPHOMETRIC ANALYSIS PERFORMED MANUALLY The tumor cells are NEGATIVE for Her2 (0). Estrogen Receptor: 10%, POSITIVE, MODERATE STAINING INTENSITY Progesterone Receptor: 0%, NEGATIVE Proliferation Marker Ki67: <1%   01/21/2019 Initial Diagnosis   Malignant neoplasm of upper-outer quadrant of right breast in female, estrogen receptor positive ([place])   01/29/2019 Breast MRI   IMPRESSION: 1. The biopsy-proven site of malignancy in the upper-outer quadrant of the right breast resides centrally with in an area of suspicious non mass enhancement spanning 6-7 cm.  2. There is a 6 mm suspicious enhancing mass in the lower-inner quadrant of the right breast, posterior depth.  3. There is a 9 mm indeterminate enhancing mass in the lower outer quadrant of the right breast.  4.  No evidence of malignancy in the left breast.   02/24/2019 Surgery   RIGHT MASTECTOMY WITH SENTINEL LYMPH NODE MAPPING AND LEFT PROPHYLACTIC MASTECTOMY by Dr. Marlou Starks  BREAST RECONSTRUCTION WITH PLACEMENT OF TISSUE EXPANDER by Dr. Marla Roe  02/24/19    02/24/2019 Pathology Results   Diagnosis 1. Breast, simple mastectomy, Left - BENIGN BREAST PARENCHYMA WITH FIBROCYSTIC CHANGE. - NEGATIVE FOR CARCINOMA. 2. Breast, simple mastectomy, Right - LOBULAR CARCINOMA IN SITU, INTERMEDIATE TO HIGH NUCLEAR GRADE. SEE NOTE. - NO EVIDENCE OF RESIDUAL INVASIVE CARCINOMA. -  RESECTION MARGINS ARE NEGATIVE FOR IN SITU OR INVASIVE CARCINOMA. - BIOPSY SITE CHANGES. - SEE ONCOLOGY  TABLE. 3. Lymph node, sentinel, biopsy, Right #1 - IMMUNOHISTOCHEMICAL STAIN FOR AE1/AE3 IS NEGATIVE FOR CARCINOMA (0/1). 4. Lymph node, sentinel, biopsy, Right - IMMUNOHISTOCHEMICAL STAIN FOR AE1/AE3 IS NEGATIVE FOR CARCINOMA (0/1). 5. Lymph node, sentinel, biopsy, Right #2 - IMMUNOHISTOCHEMICAL STAIN FOR AE1/AE3 IS NEGATIVE FOR CARCINOMA (0/1). 6. Lymph node, sentinel, biopsy, Right - IMMUNOHISTOCHEMICAL STAIN FOR AE1/AE3 IS NEGATIVE FOR CARCINOMA (0/1). 7. Lymph node, sentinel, biopsy, Right - METASTATIC CARCINOMA TO ONE LYMPH NODE (1/1) (CONFIRMED WITH IMMUNOHISTOCHEMICAL STAIN FOR AE1/AE3) - FOCUS OF METASTATIC CARCINOMA MEASURES JUST OVER 0.2 CM IN GREATEST DIMENSION - NO EVIDENCE OF EXTRANODAL EXTENSION   02/24/2019 Cancer Staging   Staging form: Breast, AJCC 8th Edition - Pathologic stage from 02/24/2019: Stage IB (pT1a, pN1a, cM0, G1, ER+, PR-, HER2-) - Signed by Truitt Merle, MD on 03/18/2019   04/01/2019 - 06/03/2019 Chemotherapy   adjuvant Taxol and Carboplatin every 3 weeks for 4 cycles starting in 2 weeks starting 04/01/19-06/03/19   06/2019 -  Anti-estrogen oral therapy   Anastrozole 12m daily starting 06/2019. Held after 2 weeks due to hot flashes and joint pain and RT.  Switched to Exemestane in 10/2019   06/30/2019 Surgery   REMOVAL OF BILATERAL TISSUE EXPANDERS WITH PLACEMENT OF BILATERAL BREAST IMPLANTS by Dr DMarla Roe 06/30/19    08/17/2019 - 09/28/2019 Radiation Therapy   Adjuvant Radiaiton with Dr KSondra Come Site Technique Total Dose (Gy) Dose per Fx (Gy) Completed Fx Beam Energies  Thorax: CW_Rt_Bst_boost Electron 10/10 2 5/5 6E  Thorax: CW_Rt 3D 50/50 2 25/25 10X, 15X, 6X  Thorax: CW_Rt_axilla Complex 50/50 2 25/25 10X     12/23/2019 Survivorship   SCP delivered by LCira Rue NP      CURRENT THERAPY:  Anastrozole 146mdaily starting 06/2019, held after 2 weeks due to hot flashes and joint pain and RT. Switched to Exemestane in 10/2019, ultimately changed to letrozole  in 01/2020 due to worsening joint pain  INTERVAL HISTORY: [name] for f/u as scheduled. She stopped exemestane in 12/2019 due to worsening severe joint pain. She was off AI for about 3 weeks and thinks pain may have improved. She started letrozole early 01/2020. Today she reports severe pain in hands, hips, feet, and right forearm. The throbbing in right forearm makes it difficult to lift/turn and care for her daughter's medical needs. Walking and some activities are limited, as is her quality of life. Average pain level is 8/10. She has added joint supplements, CBD gummies, and a weight loss supplement. She also has severe hot flashes, previously controlled with effexor but not less so. She was on gabapentin as well but reduced it due to daytime sleepiness.    MEDICAL HISTORY:  Past Medical History:  Diagnosis Date  . Anemia   . Anxiety   . Arthritis   . [facility]   [facility]  . Family history of adverse reaction to anesthesia    sister has same issues  . GERD (gastroesophageal reflux disease)   . Neuromuscular disorder ([facility])    neuropathy  . PONV (postoperative nausea and vomiting)     SURGICAL HISTORY: Past Surgical History:  Procedure Laterality Date  . BREAST RECONSTRUCTION WITH PLACEMENT OF TISSUE EXPANDER AND FLEX HD (ACELLULAR HYDRATED DERMIS) Bilateral 02/24/2019   Procedure: BREAST RECONSTRUCTION WITH PLACEMENT OF TISSUE EXPANDER AND FLEX HD (ACELLULAR HYDRATED DERMIS);  Surgeon: DiWallace GoingDO;  Location: [facility];  Service: Plastics;  Laterality: Bilateral;  . BREAST SURGERY     left breast bx  . ESOPHAGOGASTRODUODENOSCOPY (EGD) WITH ESOPHAGEAL DILATION    . EYE SURGERY     dart to the eye  (right)  . FACIAL FRACTURE SURGERY    . FRACTURE SURGERY    . KNEE SURGERY    . MASTECTOMY W/ SENTINEL NODE BIOPSY Bilateral 02/24/2019   Procedure: RIGHT MASTECTOMY WITH SENTINEL LYMPH NODE MAPPING AND LEFT PROPHYLACTIC MASTECTOMY;  Surgeon: Jovita Kussmaul, MD;   Location: [place];  Service: General;  Laterality: Bilateral;  . REMOVAL OF BILATERAL TISSUE EXPANDERS WITH PLACEMENT OF BILATERAL BREAST IMPLANTS Bilateral 06/30/2019   Procedure: REMOVAL OF BILATERAL TISSUE EXPANDERS WITH PLACEMENT OF BILATERAL BREAST IMPLANTS;  Surgeon: Wallace Going, DO;  Location: [place];  Service: Plastics;  Laterality: Bilateral;  . SHOULDER SURGERY      I have reviewed the social history and family history with the patient and they are unchanged from previous note.  ALLERGIES:  is allergic to codeine and tramadol.  MEDICATIONS:  Current Outpatient Medications  Medication Sig Dispense Refill  . acetaminophen (TYLENOL) 500 MG tablet Take 1,000 mg by mouth every 6 (six) hours as needed for moderate pain.    . Cholecalciferol (VITAMIN D3) 125 MCG (5000 UT) CAPS Take 5,000 Units by mouth daily.    . Cyanocobalamin (B-12) 2500 MCG TABS Take 2,500 mcg by mouth daily.    . cyclobenzaprine (FLEXERIL) 10 MG tablet Take 1 tablet (10 mg total) by mouth 2 (two) times daily as needed for muscle spasms. 30 tablet 0  . Diclofenac Sodium CR 100 MG 24 hr tablet Take 1 tablet (100 mg total) by mouth daily. 5 tablet 0  . docusate sodium (COLACE) 100 MG capsule Take 100 mg by mouth daily.    Marland Kitchen gabapentin (NEURONTIN) 300 MG capsule TAKE 1 CAPSULE BY MOUTH TWICE A DAY 60 capsule 0  . ibuprofen (ADVIL,MOTRIN) 200 MG tablet Take 600 mg by mouth every 6 (six) hours as needed for moderate pain.     . Iron-Vitamin C 65-125 MG TABS Take 1 tablet by mouth daily.    Marland Kitchen letrozole (FEMARA) 2.5 MG tablet TAKE 1 TABLET BY MOUTH EVERY DAY 90 tablet 1  . LORazepam (ATIVAN) 0.5 MG tablet Take 1 tablet (0.5 mg total) by mouth every 8 (eight) hours as needed for anxiety. 15 tablet 0  . Multiple Vitamin (MULTIVITAMIN WITH MINERALS) TABS tablet Take 1 tablet by mouth daily.    Marland Kitchen omeprazole (PRILOSEC) 20 MG capsule Take 20 mg by mouth daily.    . ondansetron (ZOFRAN) 4 MG tablet Take 1  tablet (4 mg total) by mouth every 8 (eight) hours as needed for nausea or vomiting. 20 tablet 0  . potassium chloride SA (KLOR-CON) 20 MEQ tablet Take 1 tablet once daily on Mondays Wednesdays and Fridays 30 tablet 1  . predniSONE (DELTASONE) 5 MG tablet 5 tab x 2 days, 4 tab x 2 days, 3 tab x 2 days, 2 tab x 2 days, 1 tab x 2 days, stop 21 tablet 0  . prochlorperazine (COMPAZINE) 10 MG tablet Take 1 tablet (10 mg total) by mouth every 6 (six) hours as needed (Nausea or vomiting). 30 tablet 1  . venlafaxine XR (EFFEXOR-XR) 75 MG 24 hr capsule TAKE 1 CAPSULE (75 MG TOTAL) BY MOUTH DAILY WITH BREAKFAST. 90 capsule 1   No current facility-administered medications for this visit.  PHYSICAL EXAMINATION: ECOG PERFORMANCE STATUS: 1 - Symptomatic but completely ambulatory  Vitals:   03/22/20 1509  BP: 120/88  Pulse: 89  Resp: 20  Temp: 98 F (36.7 C)  SpO2: 98%   Filed Weights   03/22/20 1509  Weight: 183 lb 12.8 oz (83.4 kg)    GENERAL:alert, no distress and comfortable SKIN: no rash  EYES:  sclera clear LUNGS:  normal breathing effort HEART:  no lower extremity edema ABDOMEN:abdomen soft, non-tender and normal bowel sounds Musculoskeletal: mild tenderness to hips and right forearm. Normal equal strength intact bilaterally. NEURO: alert & oriented x 3 with fluent speech, normal gait Breast exam deferred   LABORATORY DATA:  I have reviewed the data as listed CBC Latest Ref Rng & Units 03/22/2020 12/23/2019 12/18/2019  WBC 4.0 - 10.5 K/uL 6.0 6.9 5.3  Hemoglobin 12.0 - 15.0 g/dL 12.3 12.1 12.0  Hematocrit 36.0 - 46.0 % 37.9 36.7 37.4  Platelets 150 - 400 K/uL 289 294 291     CMP Latest Ref Rng & Units 03/22/2020 12/23/2019 12/18/2019  Glucose 70 - 99 mg/dL 89 98 88  BUN 6 - 20 mg/dL '9 9 11  ' Creatinine 0.44 - 1.00 mg/dL 0.81 0.82 0.70  Sodium 135 - 145 mmol/L 144 142 141  Potassium 3.5 - 5.1 mmol/L 3.7 3.3(L) 3.6  Chloride 98 - 111 mmol/L 106 104 104  CO2 22 - 32 mmol/L '26 28 28   ' Calcium 8.9 - 10.3 mg/dL 10.4(H) 9.8 9.6  Total Protein 6.5 - 8.1 g/dL 7.9 7.9 7.4  Total Bilirubin 0.3 - 1.2 mg/dL 0.3 0.4 0.4  Alkaline Phos 38 - 126 U/L 105 88 78  AST 15 - 41 U/L '18 19 22  ' ALT 0 - 44 U/L '16 19 23      ' RADIOGRAPHIC STUDIES: I have personally reviewed the radiological images as listed and agreed with the findings in the report. No results found.   ASSESSMENT & PLAN: [name] a 54 y.o.femalewith    1. Joint pain, likely related to AI -Started after she began anastrozole in 06/2019 which she discontinued. Pain persisted upon switching to exemestane in 10/2019.  -Tylenol is not effective. Gabapentin was started which is also not completely effective, she reduced the dose to 300 mg daily due to daytime drowsiness.  -her functional status is limited due to pain, she finds it difficult to do most activities even care for her daughter with medical needs.  -when I saw her in 12/2019 I recommended to switch to tamoxifen, but she was very reluctant due to risk of thrombosis. I changed her to letrozole which she started in 01/2020  -her ER+ is 10%, benefit of AI is likely not very high. However, she does not want to stop anti-estrogen therapy altogether due to her recurrence risk  -As I suspected, she did not tolerate letrozole. She continues to have severe pain in hands, right forearm which is new as of switching to letrozole, hips, and feet. She also has more hot flashes on letrozole  -she continues effexor and low dose gabapentin for hot flashes  -again I recommend changing to tamoxifen, she is hesitant, but also very reluctant to hold anti-estrogen altogether. I reviewed her thrombosis risk is not very high. She is a nonsmoker, and active in general. She is afraid because her mother had DVT (provoked, after open heart surgery in the 21's).  -I have asked Dr. Burr Medico for her input and she called the patient after the visit. After discussion  she will stop letrozole and  proceed with surveillance/close f/u. See Dr. Ernestina Penna addendum below.    2.Malignant neoplasm of upper-outer quadrant of right breast,invasive lobular carcinoma, stageIA,Snyder(T1a,N1a,M0), ER10%+, PR-, HER2-Grade I -She was diagnosed in 12/2018. She is s/Snyder b/l mastectomyon3/11/20. Pathology showed margins were negative, however she has 1/5 positive lymph nodes.  -Shecompletedadjuvant chemo with TC every 3 weeks for 4 cycles.  -She underwent breast reconstruction with implant placements on 06/30/19 -She started antiestrogentherapy with Anastrozolein 06/2019. She tolerated poorly with significant hot flash, and joint stiffness. She stopped after 2 weeks and eventually started exemestane in 10/2019 after RT. She not tolerate exemestane well either, and ultimately changed to letrozole -S/Snyder AdjuvantRadiationwith Dr Sondra Come on 08/17/19 - 09/28/19.  -on breast cancer surveillance and adjuvant AI -Labs normal except Ca 10.4 today. She previously took calcium in daily vitamins, not much dairy in her diet. Breast exam deferred.  -she is taking joint supplements, pharmacy review revealed no significant DDI's, she can continue supplements.  -Due for baseline DEXA screening this year  -after further discussion with Dr. Burr Medico, she will stop letrozole and continue surveillance. Lab and f/u in 4 months.  PLAN: -Labs reviewed -Dr. Burr Medico to call to discuss anti-estrogen therapy, patient left wihtout being seen by MD -check supplements with pharmacy -DEXA this year  -f/u TBD   No problem-specific Assessment & Plan notes found for this encounter.   Orders Placed This Encounter  Procedures  . DG Bone Density    Standing Status:   Future    Standing Expiration Date:   03/22/2021    Order Specific Question:   Reason for Exam (SYMPTOM  OR DIAGNOSIS REQUIRED)    Answer:   h/o breast cancer, on AI    Order Specific Question:   Is the patient pregnant?    Answer:   No    Order Specific Question:   Preferred  imaging location?    Answer:   [facility]   All questions were answered. The patient knows to call the clinic with any problems, questions or concerns. No barriers to learning were detected. Total encounter time was 40 minutes     Alla Feeling, NP 03/22/20   Addendum  I agree with the assessment and and plan and have edited the notes.   I called pt today and discussed her risk of recurrence with and without antiestrogen therapy.  Unfortunately her surgical sample was not sufficient for MammaPrint test, given the 10% ER positivity, and negative PR/HER2, her breast cancer is more likely triple negative disease.  She has had complete surgical resection, adjuvant chemotherapy and radiation, the benefit of antiestrogen therapy is relatively small.  She has tried all 3 aromatase inhibitors, and tolerated poorly with significant arthralgia.  She is very reluctant to try tamoxifen due to the concern of thrombosis.  I recommend her to stop letrozole, and continue breast cancer surveillance.  We discussed surveillance plan, and we will see her back every 4 to 6 months for the next 4 years.  We discussed things to watch at home, and the limitation of lab test and exam on identifying cancer recurrence.  She voiced good understanding.  After lengthy discussion, she agreed to stop letrozole, and to proceed surveillance.  She is strongly prefer close follow-up, I plan to see her back in 4 months.  Truitt Merle  03/23/2020

## 2020-03-23 ENCOUNTER — Encounter: Payer: Self-pay | Admitting: Nurse Practitioner

## 2020-03-23 LAB — CANCER ANTIGEN 27.29: CA 27.29: 29.7 U/mL (ref 0.0–38.6)

## 2020-04-14 ENCOUNTER — Other Ambulatory Visit: Payer: Self-pay | Admitting: Hematology

## 2020-05-02 ENCOUNTER — Other Ambulatory Visit: Payer: Self-pay | Admitting: Nurse Practitioner

## 2020-05-04 ENCOUNTER — Telehealth: Payer: Self-pay

## 2020-05-04 ENCOUNTER — Emergency Department (HOSPITAL_COMMUNITY): Payer: BC Managed Care – PPO

## 2020-05-04 ENCOUNTER — Emergency Department (HOSPITAL_COMMUNITY)
Admission: EM | Admit: 2020-05-04 | Discharge: 2020-05-05 | Disposition: A | Discharge status: Home / Self Care | Payer: BC Managed Care – PPO | Attending: Emergency Medicine | Admitting: Emergency Medicine

## 2020-05-04 ENCOUNTER — Encounter (HOSPITAL_COMMUNITY): Payer: Self-pay

## 2020-05-04 ENCOUNTER — Other Ambulatory Visit: Payer: Self-pay

## 2020-05-04 ENCOUNTER — Telehealth: Payer: Self-pay | Admitting: Plastic Surgery

## 2020-05-04 DIAGNOSIS — Z170 Estrogen receptor positive status [ER+]: Secondary | ICD-10-CM | POA: Diagnosis not present

## 2020-05-04 DIAGNOSIS — R079 Chest pain, unspecified: Secondary | ICD-10-CM | POA: Diagnosis not present

## 2020-05-04 DIAGNOSIS — R0600 Dyspnea, unspecified: Secondary | ICD-10-CM | POA: Diagnosis not present

## 2020-05-04 DIAGNOSIS — C50411 Malignant neoplasm of upper-outer quadrant of right female breast: Secondary | ICD-10-CM | POA: Insufficient documentation

## 2020-05-04 DIAGNOSIS — Z79899 Other long term (current) drug therapy: Secondary | ICD-10-CM | POA: Diagnosis present

## 2020-05-04 LAB — CBC WITH DIFFERENTIAL/PLATELET
Abs Immature Granulocytes: 0.02 10*3/uL (ref 0.00–0.07)
Basophils Absolute: 0.1 10*3/uL (ref 0.0–0.1)
Basophils Relative: 1 %
Eosinophils Absolute: 0.2 10*3/uL (ref 0.0–0.5)
Eosinophils Relative: 4 %
HCT: 36.3 % (ref 36.0–46.0)
Hemoglobin: 12 g/dL (ref 12.0–15.0)
Immature Granulocytes: 0 %
Lymphocytes Relative: 18 %
Lymphs Abs: 1.1 10*3/uL (ref 0.7–4.0)
MCH: 29.9 pg (ref 26.0–34.0)
MCHC: 33.1 g/dL (ref 30.0–36.0)
MCV: 90.5 fL (ref 80.0–100.0)
Monocytes Absolute: 0.6 10*3/uL (ref 0.1–1.0)
Monocytes Relative: 9 %
Neutro Abs: 4.2 10*3/uL (ref 1.7–7.7)
Neutrophils Relative %: 68 %
Platelets: 289 10*3/uL (ref 150–400)
RBC: 4.01 MIL/uL (ref 3.87–5.11)
RDW: 12.9 % (ref 11.5–15.5)
WBC: 6.2 10*3/uL (ref 4.0–10.5)
nRBC: 0 % (ref 0.0–0.2)

## 2020-05-04 LAB — I-STAT BETA HCG BLOOD, ED (MC, WL, AP ONLY): I-stat hCG, quantitative: 6.1 m[IU]/mL — ABNORMAL HIGH (ref <5)

## 2020-05-04 LAB — BASIC METABOLIC PANEL
Anion gap: 13 (ref 5–15)
BUN: 8 mg/dL (ref 6–20)
CO2: 22 mmol/L (ref 22–32)
Calcium: 9.5 mg/dL (ref 8.9–10.3)
Chloride: 105 mmol/L (ref 98–111)
Creatinine, Ser: 0.71 mg/dL (ref 0.44–1.00)
GFR calc Af Amer: >60 mL/min (ref >60)
GFR calc non Af Amer: >60 mL/min (ref >60)
Glucose, Bld: 95 mg/dL (ref 70–99)
Potassium: 3.1 mmol/L — ABNORMAL LOW (ref 3.5–5.1)
Sodium: 140 mmol/L (ref 135–145)

## 2020-05-04 LAB — TROPONIN I (HIGH SENSITIVITY): Troponin I (High Sensitivity): 2 ng/L (ref <18)

## 2020-05-04 NOTE — Telephone Encounter (Addendum)
Spoke with patient this afternoon at 4:17 PM as I was in surgery prior.  Patient reports that she has noticed some changes in her breathing and did not feel quite right.  She also reports that she had noticed some swelling of her bilateral lower extremities in the last week and some cramping.  She does feel like her heart is racing a little bit.  She does not report any recent changes in her health status, any new procedures or significant events.  She reports that trying to take a deep breath fatigues her, but she does not have pain with inspiration, hemoptysis, n/v/f/c. She does endorse a headache. I recommended that patient be evaluated in the emergency room for work-up of her breathing concerns.  She reported that she would discuss this with her husband and make a decision.  I did reiterate that I thought it was very important for her to be evaluated.  In regards to her bilateral breast implants she feels as if she has a little bit of swelling, she reports that she has noticed some fluid sloshing sounds, mostly when bending over, but is now noticing this all the time. She does not report enlargement of her breasts or one side being larger than the other. No overlying skin changes.   Patient endorses that she does not feel sick. She does not have pain with palpation of breasts. She also endorses pain in her right arm, near the elbow.   I informed patient that I can see her 8AM Monday morning, 05/08/20, but I highly recommend she be evaluated in the ED for her breathing concerns. She reported she was going to have the nurse who cares for her daughter evaluate her tonight. I advised her to call back with any further questions or concerns.  I spoke with patient at 4:17 PM and called her back to follow up at 4:50.

## 2020-05-04 NOTE — Telephone Encounter (Signed)
Patient called to state she has fluid buildup in her breasts that she is able to feel when she bends down and stands upright again. She would like a call to determine if she needs to come in for evaluation.

## 2020-05-04 NOTE — Telephone Encounter (Signed)
Patient called back to advise that her breathing is not quite right. Advd her if she starts feeling shortness of breath or has a fever or chills, she should go to the ED. Advd I would update previous note to add that her breathing is not quite right. Please call back to advise.

## 2020-05-04 NOTE — ED Triage Notes (Addendum)
Pt reports feeling a tightness and like there is fluid around her R breast implant. Patient is a little over a year post mastectomy. Reports some pain in her R forearm and up to her bicep. A&Ox4. She called her surgeon, who recommended that she come in for a scan.

## 2020-05-04 NOTE — ED Provider Notes (Signed)
[facility] DEPT Provider Note   CSN: [id number] Arrival date & time: 05/04/20  2018     History Chief Complaint  Patient presents with  . Post-op Problem    Delina P Wojtkowiak is a 54 y.o. female.  The history is provided by the patient and medical records.    54 year old female with history of anemia, anxiety, arthritis, right-sided breast cancer status postmastectomy and reconstruction, GERD, peripheral neuropathy, presenting to the ED with right-sided chest/breast pain.  States she noticed this a few days ago but seems worse now.  States initially she only had pain and discomfort when bending forward and felt like there was some fluid accumulation in the inferior portion of her breast.  She has not noticed any overlying skin changes or warmth to touch.  States today she started noticing some of the pain extending into her right axilla, swelling of the legs, and difficulty breathing.  States there is almost like a "heaviness" on her chest preventing her from breathing well.  She does report some discomfort with deep breathing.  Her surgeries were performed approximately 1 year ago by Dr. Theodoro Kos- [place].  Denies any history of DVT or PE.  No known cardiac history.  Past Medical History:  Diagnosis Date  . Anemia   . Anxiety   . Arthritis   . Cancer [facility])    right breast  . Family history of adverse reaction to anesthesia    sister has same issues  . GERD (gastroesophageal reflux disease)   . Neuromuscular disorder (HCC)    neuropathy  . PONV (postoperative nausea and vomiting)     Patient Active Problem List   Diagnosis Date Noted  . S/P breast reconstruction 07/09/2019  . Right knee pain 06/22/2019  . Acquired absence of bilateral breasts and nipples 03/02/2019  . Breast cancer ([place]) 02/24/2019  . Malignant neoplasm of upper-outer quadrant of right breast in female, estrogen receptor positive ([place]) 01/21/2019  . Iron deficiency  anemia 11/23/2018  . Pain in right knee 03/18/2018  . Injury of right knee 03/18/2018  . GERD (gastroesophageal reflux disease) 05/21/2016    Past Surgical History:  Procedure Laterality Date  . BREAST RECONSTRUCTION WITH PLACEMENT OF TISSUE EXPANDER AND FLEX HD (ACELLULAR HYDRATED DERMIS) Bilateral 02/24/2019   Procedure: BREAST RECONSTRUCTION WITH PLACEMENT OF TISSUE EXPANDER AND FLEX HD (ACELLULAR HYDRATED DERMIS);  Surgeon: Wallace Going, DO;  Location: [facility];  Service: Plastics;  Laterality: Bilateral;  . BREAST SURGERY     left breast bx  . ESOPHAGOGASTRODUODENOSCOPY (EGD) WITH ESOPHAGEAL DILATION    . EYE SURGERY     dart to the eye  (right)  . FACIAL FRACTURE SURGERY    . FRACTURE SURGERY    . KNEE SURGERY    . MASTECTOMY W/ SENTINEL NODE BIOPSY Bilateral 02/24/2019   Procedure: RIGHT MASTECTOMY WITH SENTINEL LYMPH NODE MAPPING AND LEFT PROPHYLACTIC MASTECTOMY;  Surgeon: Jovita Kussmaul, MD;  Location: [facility];  Service: General;  Laterality: Bilateral;  . REMOVAL OF BILATERAL TISSUE EXPANDERS WITH PLACEMENT OF BILATERAL BREAST IMPLANTS Bilateral 06/30/2019   Procedure: REMOVAL OF BILATERAL TISSUE EXPANDERS WITH PLACEMENT OF BILATERAL BREAST IMPLANTS;  Surgeon: Wallace Going, DO;  Location: [facility];  Service: Plastics;  Laterality: Bilateral;  . SHOULDER SURGERY       OB History   No obstetric history on file.     Family History  Problem Relation Age of Onset  . Brain cancer Father   .  Cancer Maternal Uncle        melanoma   . Cancer Paternal Uncle        gastric cancer  . Spinal muscular atrophy Daughter   . Spinal muscular atrophy Child   . Breast cancer Neg Hx   . Colon cancer Neg Hx   . Esophageal cancer Neg Hx   . Stomach cancer Neg Hx   . Rectal cancer Neg Hx     Social History   Tobacco Use  . Smoking status: Never Smoker  . Smokeless tobacco: Never Used  Substance Use Topics  . Alcohol use: Yes    Comment: occasional  .  Drug use: No    Home Medications Prior to Admission medications   Medication Sig Start Date End Date Taking? Authorizing Provider  acetaminophen (TYLENOL) 500 MG tablet Take 1,000 mg by mouth every 6 (six) hours as needed for moderate pain.    [provider]  Cholecalciferol (VITAMIN D3) 125 MCG (5000 UT) CAPS Take 5,000 Units by mouth daily.    [provider]  Cyanocobalamin (B-12) 2500 MCG TABS Take 2,500 mcg by mouth daily.    [provider]  cyclobenzaprine (FLEXERIL) 10 MG tablet Take 1 tablet (10 mg total) by mouth 2 (two) times daily as needed for muscle spasms. 10/05/19   Scheeler, Carola Rhine, PA-C  Diclofenac Sodium CR 100 MG 24 hr tablet Take 1 tablet (100 mg total) by mouth daily. 03/05/19   Palumbo, April, MD  docusate sodium (COLACE) 100 MG capsule Take 100 mg by mouth daily.    [provider]  gabapentin (NEURONTIN) 300 MG capsule TAKE 1 CAPSULE BY MOUTH TWICE A DAY 03/08/20   Truitt Merle, MD  ibuprofen (ADVIL,MOTRIN) 200 MG tablet Take 600 mg by mouth every 6 (six) hours as needed for moderate pain.     [provider]  Iron-Vitamin C 65-125 MG TABS Take 1 tablet by mouth daily.    [provider]  letrozole (FEMARA) 2.5 MG tablet TAKE 1 TABLET BY MOUTH EVERY DAY 03/13/20   Alla Feeling, NP  LORazepam (ATIVAN) 0.5 MG tablet Take 1 tablet (0.5 mg total) by mouth every 8 (eight) hours as needed for anxiety. 07/30/19   Truitt Merle, MD  Multiple Vitamin (MULTIVITAMIN WITH MINERALS) TABS tablet Take 1 tablet by mouth daily.    [provider]  omeprazole (PRILOSEC) 20 MG capsule Take 20 mg by mouth daily.    [provider]  ondansetron (ZOFRAN) 4 MG tablet Take 1 tablet (4 mg total) by mouth every 8 (eight) hours as needed for nausea or vomiting. 06/22/19   Scheeler, Carola Rhine, PA-C  potassium chloride SA (KLOR-CON M20) 20 MEQ tablet TAKE 1 TABLET ONCE DAILY ON MONDAYS [facility] AND FRIDAYS 05/02/20   Alla Feeling,  NP  predniSONE (DELTASONE) 5 MG tablet 5 tab x 2 days, 4 tab x 2 days, 3 tab x 2 days, 2 tab x 2 days, 1 tab x 2 days, stop 07/30/19   Harle Stanford., PA-C  prochlorperazine (COMPAZINE) 10 MG tablet Take 1 tablet (10 mg total) by mouth every 6 (six) hours as needed (Nausea or vomiting). 03/18/19   Truitt Merle, MD  venlafaxine XR (EFFEXOR-XR) 75 MG 24 hr capsule TAKE 1 CAPSULE (75 MG TOTAL) BY MOUTH DAILY WITH BREAKFAST. 04/14/20   Truitt Merle, MD    Allergies    Codeine and Tramadol  Review of Systems   Review of Systems  Respiratory: Positive for  shortness of breath.   Cardiovascular: Positive for chest pain.  All other systems reviewed and are negative.   Physical Exam Updated Vital Signs BP 131/86 (BP Location: Left Arm)   Pulse 77   Temp 98.3 F (36.8 C) (Oral)   Resp 16   SpO2 99%   Physical Exam Vitals and nursing note reviewed.  Constitutional:      Appearance: She is well-developed.  HENT:     Head: Normocephalic and atraumatic.  Eyes:     Conjunctiva/sclera: Conjunctivae normal.     Pupils: Pupils are equal, round, and reactive to light.  Cardiovascular:     Rate and Rhythm: Normal rate and regular rhythm.     Heart sounds: Normal heart sounds.  Pulmonary:     Effort: Pulmonary effort is normal.     Breath sounds: Normal breath sounds.  Chest:     Comments: Right breast with surgically absent nipple and well healed surgical scars, there is no overlying erythema, induration, or signs of skin infection, no appreciable mass or focal swelling appreciated Abdominal:     General: Bowel sounds are normal.     Palpations: Abdomen is soft.  Musculoskeletal:        General: Normal range of motion.     Cervical back: Normal range of motion.     Comments: No peripheral edema noted  Skin:    General: Skin is warm and dry.  Neurological:     Mental Status: She is alert and oriented to person, place, and time.     ED Results / Procedures / Treatments   Labs (all labs  ordered are listed, but only abnormal results are displayed) Labs Reviewed  BASIC METABOLIC PANEL - Abnormal; Notable for the following components:      Result Value   Potassium 3.1 (*)    All other components within normal limits  I-STAT BETA HCG BLOOD, ED ([facility], [facility], [facility] ONLY) - Abnormal; Notable for the following components:   I-stat hCG, quantitative 6.1 (*)    All other components within normal limits  CBC WITH DIFFERENTIAL/PLATELET  BRAIN NATRIURETIC PEPTIDE  TROPONIN I (HIGH SENSITIVITY)    EKG None  Radiology CT Angio Chest PE W and/or Wo Contrast  Result Date: 05/05/2020 CLINICAL DATA:  Shortness of breath. EXAM: CT ANGIOGRAPHY CHEST WITH CONTRAST TECHNIQUE: Multidetector CT imaging of the chest was performed using the standard protocol during bolus administration of intravenous contrast. Multiplanar CT image reconstructions and MIPs were obtained to evaluate the vascular anatomy. CONTRAST:  13mL OMNIPAQUE IOHEXOL 350 MG/ML SOLN COMPARISON:  March 05, 2019 FINDINGS: Cardiovascular: Contrast injection is sufficient to demonstrate satisfactory opacification of the pulmonary arteries to the segmental level. There is no pulmonary embolus. The main pulmonary artery is within normal limits for size. There is no CT evidence of acute right heart strain. The visualized aorta is normal. Heart size is normal, without pericardial effusion. There is an aberrant right subclavian artery, a normal variant. Mediastinum/Nodes: --No mediastinal or hilar lymphadenopathy. --No axillary lymphadenopathy. --No supraclavicular lymphadenopathy. --Normal thyroid gland. --The esophagus is unremarkable Lungs/Pleura: There are few small ground-glass airspace opacities in the anterior right upper lobe. These are new since prior study there is no pneumothorax. No significant pleural effusion. Upper Abdomen: No acute abnormality. Musculoskeletal: Bilateral breast implants are noted. There is no significant abnormality  surrounding the right breast implant. Review of the MIP images confirms the above findings. IMPRESSION: 1. No evidence of pulmonary embolus. 2. There are few small ground-glass airspace  opacities in the anterior right upper lobe, new since prior study. Findings are favored to represent an infectious or inflammatory process. Given the patient's history of breast cancer, consider a three-month follow-up CT chest to confirm resolution of this finding. 3. Aberrant right subclavian artery, a normal variant. Electronically Signed   By: Constance Holster M.D.   On: 05/05/2020 00:43    Procedures Procedures (including critical care time)  Medications Ordered in ED Medications - No data to display  ED Course  I have reviewed the triage vital signs and the nursing notes.  Pertinent labs & imaging results that were available during my care of the patient were reviewed by me and considered in my medical decision making (see chart for details).    MDM Rules/Calculators/A&P  54 year old female presenting to the ED with shortness of breath and right-sided breast/chest pain.  She is status post mastectomy with reconstruction about 1 year ago.  States of the past few days she has felt "fluid" in her right breast when bending forward.  Today she developed some difficulty breathing, states it felt "abnormal", lower leg swelling, and pain with inspiration.  She is afebrile and nontoxic in appearance here.  Her breast is overall normal in appearance without external signs of infection.  Nipple is surgically absent and surgical incisions are well-healed.  No appreciable fluid or abscess collection noted.  She has no peripheral edema and vitals are stable on room air.  No signs of respiratory distress.  Given her symptoms, will obtain labs and CT angio of the chest to assess for PE, chest infection, or superficial abnormality with right breast.  Labs reassuring.  CTA without findings of PE, does have some  inflammatory/infectious findings in the right upper lobe.   There are also no abnormalities noted about the right breast implant.  Patient's vitals remained stable, remains without any signs of respiratory distress or fever.  She has not had any cough, sore throat, sick contacts, or known Covid exposures.  She is fully vaccinated against COVID-19.  Given patient's history of reported change in breathing today, will start on course of antibiotics and recommend close follow-up with PCP for repeat imaging to ensure this has resolved.  She may follow-up with her plastic surgeon for any other questions or concerns regarding her implants.  She may return here for any new or acute changes.  Final Clinical Impression(s) / ED Diagnoses Final diagnoses:  Right-sided chest pain    Rx / DC Orders ED Discharge Orders         Ordered    doxycycline (VIBRAMYCIN) 100 MG capsule  2 times daily     05/05/20 0113           Larene Pickett, PA-C 05/05/20 0115    Davonna Belling, MD 05/06/20 [id number]

## 2020-05-05 ENCOUNTER — Emergency Department (HOSPITAL_COMMUNITY): Payer: BC Managed Care – PPO

## 2020-05-05 LAB — BRAIN NATRIURETIC PEPTIDE: B Natriuretic Peptide: 7.5 pg/mL (ref 0.0–100.0)

## 2020-05-05 MED ORDER — IOHEXOL 350 MG/ML SOLN
100.0000 mL | Freq: Once | INTRAVENOUS | Status: AC | PRN
  Administered 2020-05-05: 72 mL via INTRAVENOUS

## 2020-05-05 MED ORDER — DOXYCYCLINE HYCLATE 100 MG PO CAPS
100.0000 mg | ORAL_CAPSULE | Freq: Two times a day (BID) | ORAL | 0 refills | Status: DC
Start: 2020-05-05 — End: 2020-05-18

## 2020-05-05 MED ORDER — SODIUM CHLORIDE (PF) 0.9 % IJ SOLN
INTRAMUSCULAR | Status: AC
  Filled 2020-05-05: qty 50

## 2020-05-05 NOTE — Discharge Instructions (Signed)
Take the prescribed medication as directed. Follow-up with your primary care doctor.  It is recommended that you have follow-up imaging for re-check of findings in right lung on your CT today. Return to the ED for new or worsening symptoms.

## 2020-05-05 NOTE — ED Notes (Signed)
Pt transported to CT ?

## 2020-05-05 NOTE — Telephone Encounter (Signed)
See second note by Matthew,PA-C//AB/CMA

## 2020-05-05 NOTE — Telephone Encounter (Deleted)
Spoke with patient this afternoon at 4:17 PM as I was in surgery prior.  Patient reports that she has noticed some changes in her breathing and did not feel quite right.  She also reports that she had noticed some swelling of her bilateral lower extremities in the last week and some cramping.  She does feel like her heart is racing a little bit.  She does not report any recent changes in her health status, any new procedures or significant events.  She reports that trying to take a deep breath fatigues her, but she does not have pain with inspiration, hemoptysis, n/v/f/c. She does endorse a headache. I recommended that patient be evaluated in the emergency room for work-up of her breathing concerns.  She reported that she would discuss this with her husband and make a decision.  I did reiterate that I thought it was very important for her to be evaluated.  In regards to her bilateral breast implants she feels as if she has a little bit of swelling, she reports that she has noticed some fluid sloshing sounds, mostly when bending over, but is now noticing this all the time. She does not report enlargement of her breasts or one side being larger than the other. No overlying skin changes.   Patient endorses that she does not feel sick. She does not have pain with palpation of breasts. She also endorses pain in her right arm, near the elbow.   I informed patient that I can see her 8AM Monday morning, 05/08/20, but I highly recommend she be evaluated in the ED for her breathing concerns. She reported she was going to have the nurse who cares for her daughter evaluate her tonight. I advised her to call back with any further questions or concerns.  I spoke with patient at 4:17 PM and called her back to follow up at 4:50.

## 2020-05-12 ENCOUNTER — Other Ambulatory Visit: Payer: Self-pay | Admitting: Hematology

## 2020-05-12 ENCOUNTER — Ambulatory Visit (INDEPENDENT_AMBULATORY_CARE_PROVIDER_SITE_OTHER): Payer: BC Managed Care – PPO | Admitting: Surgical

## 2020-05-12 ENCOUNTER — Other Ambulatory Visit: Payer: Self-pay

## 2020-05-12 ENCOUNTER — Encounter: Payer: Self-pay | Admitting: Surgical

## 2020-05-12 VITALS — BP 115/78 | HR 78 | Temp 98.2°F | Ht 63.0 in | Wt 182.0 lb

## 2020-05-12 DIAGNOSIS — Z170 Estrogen receptor positive status [ER+]: Secondary | ICD-10-CM

## 2020-05-12 DIAGNOSIS — C50411 Malignant neoplasm of upper-outer quadrant of right female breast: Secondary | ICD-10-CM

## 2020-05-12 DIAGNOSIS — Z9013 Acquired absence of bilateral breasts and nipples: Secondary | ICD-10-CM

## 2020-05-12 DIAGNOSIS — R0781 Pleurodynia: Secondary | ICD-10-CM | POA: Diagnosis not present

## 2020-05-12 DIAGNOSIS — Z9889 Other specified postprocedural states: Secondary | ICD-10-CM | POA: Diagnosis not present

## 2020-05-12 NOTE — Progress Notes (Signed)
Subjective:     Patient ID: [name], female    DOB: [birth year], 54 y.o.   MRN: [id number]  Chief Complaint  Patient presents with  . Follow-up    for fluid in the chest post mastectomy    HPI: The patient is a 54 y.o. female here for follow-up on her bilateral breast reconstruction after mastectomy.  She currently has implants in place.  Patient had previously went to the ED on 05/05/2020 for evaluation of shortness of breath, chest pain with inspiration.  CT angio showed no evidence of pulmonary embolism.  It did show small groundglass airspace opacities in her right upper lobe, patient was prescribed doxycycline by ED provider.  CT scan did not show any fluid collections around the right breast capsule or right breast implant.  It did not show ruptured implants.  No abnormalities noted with the right or left breast.  She reports that approximately 2 weeks ago she did lift a heavy suitcase up a staircase.  She is not having any fevers, chills, nausea, vomiting.  Right chest/breast pain is reproducible with palpation of right rib.  Patient has previously completed physical therapy after mastectomy for assistance with regaining range of motion.  She reports that she recalls the exercises that they recommended.  Review of Systems  Constitutional: Negative.   Cardiovascular: Positive for chest pain.  Musculoskeletal: Positive for arthralgias.  Skin: Negative for rash and wound.  Neurological: Negative.      Objective:   Vital Signs BP 115/78 (BP Location: Left Arm, Patient Position: Sitting, Cuff Size: Normal)   Pulse 78   Temp 98.2 F (36.8 C) (Temporal)   Ht 5\' 3"  (1.6 m)   Wt 182 lb (82.6 kg)   SpO2 100%   BMI 32.24 kg/m  Vital Signs and Nursing Note Reviewed Chaperone present tPhysical Exam  Constitutional: She is oriented to person, place, and time and well-developed, well-nourished, and in no distress.  Pulmonary/Chest: Effort normal. She exhibits tenderness and  bony tenderness. She exhibits no mass, no crepitus, no edema and no swelling.    Musculoskeletal:        General: No edema. Normal range of motion.  Neurological: She is alert and oriented to person, place, and time.  Skin: Skin is warm and dry. No rash noted. She is not diaphoretic. No erythema.  Psychiatric: Mood and affect normal.    Assessment/Plan:     ICD-10-CM   1. Malignant neoplasm of upper-outer quadrant of right breast in female, estrogen receptor positive ([place])  C50.411    Z17.0   2. S/P breast reconstruction  Z98.890   3. Acquired absence of bilateral breasts and nipples  Z90.13   4. Rib pain on right side  R07.81     Patient is a 54 year old female that is approximately 10 months out from placement of bilateral breast implants with Dr. Marla Snyder.  She is having some right lateral breast/chest pain.  Pain is reproducible with palpation.  I do not feel any fluid collections on exam.  CT scan on 05/05/2020 in the ED showed no fluid collections around the implants bilaterally.  Recommend ordering compression sleeve for right arm to assist with swelling of right arm; possible lymphedema.  Recommend ibuprofen x2 to 3 weeks for possible costochondritis of right lateral ribs after heavy lifting.  Recommend PT for assistance with range of motion.  Patient reports that she recalls exercises recommended to her previously and will begin these at home.  She will call if  she would like to be referred to PT again.  Recommend 1 month follow-up for reevaluation, patient is very busy at this time caring for her daughter and will call if symptoms do not improve or worsen.   Carola Rhine [place], PA-C 05/12/2020, 1:04 PM

## 2020-05-14 ENCOUNTER — Other Ambulatory Visit: Payer: Self-pay

## 2020-05-14 ENCOUNTER — Emergency Department (HOSPITAL_COMMUNITY): Payer: BC Managed Care – PPO

## 2020-05-14 ENCOUNTER — Emergency Department (HOSPITAL_COMMUNITY)
Admission: EM | Admit: 2020-05-14 | Discharge: 2020-05-14 | Disposition: A | Discharge status: Home / Self Care | Payer: BC Managed Care – PPO | Attending: Emergency Medicine | Admitting: Emergency Medicine

## 2020-05-14 ENCOUNTER — Encounter (HOSPITAL_COMMUNITY): Payer: Self-pay | Admitting: Emergency Medicine

## 2020-05-14 DIAGNOSIS — Z853 Personal history of malignant neoplasm of breast: Secondary | ICD-10-CM | POA: Insufficient documentation

## 2020-05-14 DIAGNOSIS — R0789 Other chest pain: Secondary | ICD-10-CM | POA: Diagnosis not present

## 2020-05-14 DIAGNOSIS — R079 Chest pain, unspecified: Secondary | ICD-10-CM | POA: Diagnosis present

## 2020-05-14 DIAGNOSIS — N644 Mastodynia: Secondary | ICD-10-CM | POA: Insufficient documentation

## 2020-05-14 LAB — HEPATIC FUNCTION PANEL
ALT: 17 U/L (ref 0–44)
AST: 23 U/L (ref 15–41)
Albumin: 4.3 g/dL (ref 3.5–5.0)
Alkaline Phosphatase: 93 U/L (ref 38–126)
Bilirubin, Direct: 0.2 mg/dL (ref 0.0–0.2)
Indirect Bilirubin: 0.3 mg/dL (ref 0.3–0.9)
Total Bilirubin: 0.5 mg/dL (ref 0.3–1.2)
Total Protein: 7.7 g/dL (ref 6.5–8.1)

## 2020-05-14 LAB — CBC
HCT: 39.4 % (ref 36.0–46.0)
Hemoglobin: 12.8 g/dL (ref 12.0–15.0)
MCH: 28.9 pg (ref 26.0–34.0)
MCHC: 32.5 g/dL (ref 30.0–36.0)
MCV: 88.9 fL (ref 80.0–100.0)
Platelets: 300 10*3/uL (ref 150–400)
RBC: 4.43 MIL/uL (ref 3.87–5.11)
RDW: 12.9 % (ref 11.5–15.5)
WBC: 6.1 10*3/uL (ref 4.0–10.5)
nRBC: 0 % (ref 0.0–0.2)

## 2020-05-14 LAB — BASIC METABOLIC PANEL
Anion gap: 14 (ref 5–15)
BUN: 10 mg/dL (ref 6–20)
CO2: 22 mmol/L (ref 22–32)
Calcium: 9.8 mg/dL (ref 8.9–10.3)
Chloride: 105 mmol/L (ref 98–111)
Creatinine, Ser: 0.67 mg/dL (ref 0.44–1.00)
GFR calc Af Amer: >60 mL/min (ref >60)
GFR calc non Af Amer: >60 mL/min (ref >60)
Glucose, Bld: 85 mg/dL (ref 70–99)
Potassium: 3.5 mmol/L (ref 3.5–5.1)
Sodium: 141 mmol/L (ref 135–145)

## 2020-05-14 LAB — TROPONIN I (HIGH SENSITIVITY)
Troponin I (High Sensitivity): <2 ng/L (ref <18)
Troponin I (High Sensitivity): 2 ng/L (ref <18)

## 2020-05-14 LAB — LIPASE, BLOOD: Lipase: 30 U/L (ref 11–51)

## 2020-05-14 MED ORDER — HYDROMORPHONE HCL 1 MG/ML IJ SOLN
0.5000 mg | Freq: Once | INTRAMUSCULAR | Status: DC
  Filled 2020-05-14: qty 1

## 2020-05-14 MED ORDER — SODIUM CHLORIDE 0.9% FLUSH: 3.0000 mL | Freq: Once | INTRAVENOUS | Status: DC

## 2020-05-14 MED ORDER — ONDANSETRON HCL 4 MG/2ML IJ SOLN: 4.0000 mg | Freq: Once | INTRAMUSCULAR | Status: DC

## 2020-05-14 MED ORDER — ASPIRIN 325 MG PO TABS: 325.0000 mg | ORAL_TABLET | Freq: Every day | ORAL | Status: DC

## 2020-05-14 NOTE — ED Notes (Signed)
Pt refused 0.5mg  Dilaudid

## 2020-05-14 NOTE — Discharge Instructions (Addendum)
Please follow up with Dr. Burr Medico early this week regarding your symptoms Return to the ED for any worsening symptoms including worsening pain, shortness of breath, excessive vomiting, severe abdominal pain, fevers > 100.4, or any other new/concerning symptoms

## 2020-05-14 NOTE — ED Triage Notes (Signed)
Patient here from home reporting chest pain and bone pain that started 10 days ago. Reports tightness in chest. Hx of breast cancer.

## 2020-05-14 NOTE — ED Provider Notes (Signed)
[facility] DEPT Provider Note   CSN: [id number] Arrival date & time: 05/14/20  1658     History Chief Complaint  Patient presents with  . Chest Pain  . Nausea  . Emesis    [name] is a 54 y.o. female with PMHx GERD, anemia, anxiety, right breast cancer s/p mastectomy and breast reconstruction done by Dr. Madlyn Frankel last year who presents to the ED today with complaint of gradual onset, constant, unchanged, right sided chest/breast pain x 1.5 weeks.  Reports she began having nonbloody nonbilious emesis x4 today and called her [facility] Dr. Ernestina Penna office who told her to come to the ED immediately.  Was seen in the ED on 05/20 for same and had a CTA which was negative for PE.  CTA did show some inflammatory/infectious findings in the right upper lobe and patient was discharged home with doxycycline.  Ports she took her last dose today.  She states she started having vomiting which concerned her.  He also mentions that she had one episode of watery diarrhea yesterday.  Has not had a bowel movement today.  Patient states she is also having hip pain and back pain and is concerned that her cancer has spread.  Patient denies fevers, chills, cough, hemoptysis, unilateral leg swelling, shortness of breath, constipation, melena, urinary sx, or any other associated symptoms.   Follow up visit with plastic surgery 05/28 Assessment/Plan:     ICD-10-CM   1. Malignant neoplasm of upper-outer quadrant of right breast in female, estrogen receptor positive ([facility])  C50.411    Z17.0   2. S/P breast reconstruction  Z98.890   3. Acquired absence of bilateral breasts and nipples  Z90.13   4. Rib pain on right side  R07.81     Patient is a 54 year old female that is approximately 10 months out from placement of bilateral breast implants with Dr. Marla Roe.  She is having some right lateral breast/chest pain.  Pain is reproducible with palpation.  I  do not feel any fluid collections on exam.  CT scan on 05/05/2020 in the ED showed no fluid collections around the implants bilaterally.  Recommend ordering compression sleeve for right arm to assist with swelling of right arm; possible lymphedema.  Recommend ibuprofen x2 to 3 weeks for possible costochondritis of right lateral ribs after heavy lifting.  Recommend PT for assistance with range of motion.  Patient reports that she recalls exercises recommended to her previously and will begin these at home.  She will call if she would like to be referred to PT again.  Recommend 1 month follow-up for reevaluation, patient is very busy at this time caring for her daughter and will call if symptoms do not improve or worsen.   [name], PA-C 05/12/2020, 1:04 PM   The history is provided by the patient and medical records.       Past Medical History:  Diagnosis Date  . Anemia   . Anxiety   . Arthritis   . Cancer [facility])    right breast  . Family history of adverse reaction to anesthesia    sister has same issues  . GERD (gastroesophageal reflux disease)   . Neuromuscular disorder ([facility])    neuropathy  . PONV (postoperative nausea and vomiting)     Patient Active Problem List   Diagnosis Date Noted  . S/P breast reconstruction 07/09/2019  . Right knee pain 06/22/2019  . Acquired absence of bilateral breasts and nipples 03/02/2019  .  Breast cancer ([facility]) 02/24/2019  . Malignant neoplasm of upper-outer quadrant of right breast in female, estrogen receptor positive ([facility]) 01/21/2019  . Iron deficiency anemia 11/23/2018  . Pain in right knee 03/18/2018  . Injury of right knee 03/18/2018  . GERD (gastroesophageal reflux disease) 05/21/2016    Past Surgical History:  Procedure Laterality Date  . BREAST RECONSTRUCTION WITH PLACEMENT OF TISSUE EXPANDER AND FLEX HD (ACELLULAR HYDRATED DERMIS) Bilateral 02/24/2019   Procedure: BREAST RECONSTRUCTION WITH PLACEMENT OF TISSUE  EXPANDER AND FLEX HD (ACELLULAR HYDRATED DERMIS);  Surgeon: Wallace Going, DO;  Location: [facility];  Service: Plastics;  Laterality: Bilateral;  . BREAST SURGERY     left breast bx  . ESOPHAGOGASTRODUODENOSCOPY (EGD) WITH ESOPHAGEAL DILATION    . EYE SURGERY     dart to the eye  (right)  . FACIAL FRACTURE SURGERY    . FRACTURE SURGERY    . KNEE SURGERY    . MASTECTOMY W/ SENTINEL NODE BIOPSY Bilateral 02/24/2019   Procedure: RIGHT MASTECTOMY WITH SENTINEL LYMPH NODE MAPPING AND LEFT PROPHYLACTIC MASTECTOMY;  Surgeon: Jovita Kussmaul, MD;  Location: [facility];  Service: General;  Laterality: Bilateral;  . REMOVAL OF BILATERAL TISSUE EXPANDERS WITH PLACEMENT OF BILATERAL BREAST IMPLANTS Bilateral 06/30/2019   Procedure: REMOVAL OF BILATERAL TISSUE EXPANDERS WITH PLACEMENT OF BILATERAL BREAST IMPLANTS;  Surgeon: Wallace Going, DO;  Location: [facility];  Service: Plastics;  Laterality: Bilateral;  . SHOULDER SURGERY       OB History   No obstetric history on file.     Family History  Problem Relation Age of Onset  . Brain cancer Father   . Cancer Maternal Uncle        melanoma   . Cancer Paternal Uncle        gastric cancer  . Spinal muscular atrophy Daughter   . Spinal muscular atrophy Child   . Breast cancer Neg Hx   . Colon cancer Neg Hx   . Esophageal cancer Neg Hx   . Stomach cancer Neg Hx   . Rectal cancer Neg Hx     Social History   Tobacco Use  . Smoking status: Never Smoker  . Smokeless tobacco: Never Used  Substance Use Topics  . Alcohol use: Yes    Comment: occasional  . Drug use: No    Home Medications Prior to Admission medications   Medication Sig Start Date End Date Taking? Authorizing Provider  Cholecalciferol (VITAMIN D3) 125 MCG (5000 UT) CAPS Take 5,000 Units by mouth daily.   Yes [provider]  doxycycline (VIBRAMYCIN) 100 MG capsule Take 1 capsule (100 mg total) by mouth 2 (two) times daily. Patient taking  differently: Take 100 mg by mouth 2 (two) times daily. Start date : 05/04/20 05/05/20  Yes Larene Pickett, PA-C  ibuprofen (ADVIL,MOTRIN) 200 MG tablet Take 600 mg by mouth every 6 (six) hours as needed for moderate pain.    Yes [provider]  Multiple Vitamin (MULTIVITAMIN WITH MINERALS) TABS tablet Take 1 tablet by mouth daily.   Yes [provider]  omeprazole (PRILOSEC) 20 MG capsule Take 20 mg by mouth daily.   Yes [provider]  potassium chloride SA (KLOR-CON M20) 20 MEQ tablet TAKE 1 TABLET ONCE DAILY ON MONDAYS WEDNESDAYS AND FRIDAYS Patient taking differently: Take 20 mEq by mouth daily.  05/02/20  Yes Alla Feeling, NP  venlafaxine XR (EFFEXOR-XR) 75 MG 24 hr capsule TAKE 1 CAPSULE (75 MG TOTAL)  BY MOUTH DAILY WITH BREAKFAST. 04/14/20  Yes Truitt Merle, MD  zolpidem (AMBIEN) 5 MG tablet Take 5 mg by mouth at bedtime as needed for sleep. 05/01/20  Yes [provider]    Allergies    Codeine and Tramadol  Review of Systems   Review of Systems  Constitutional: Negative for chills and fever.  Respiratory: Negative for cough and shortness of breath.   Cardiovascular: Positive for chest pain.  Gastrointestinal: Positive for abdominal pain, diarrhea, nausea and vomiting. Negative for constipation.  All other systems reviewed and are negative.   Physical Exam Updated Vital Signs BP (!) 118/92   Pulse 75   Temp 98.6 F (37 C) (Oral)   Resp 18   SpO2 99%   Physical Exam Vitals and nursing note reviewed.  Constitutional:      Appearance: She is not ill-appearing or diaphoretic.  HENT:     Head: Normocephalic and atraumatic.  Eyes:     Conjunctiva/sclera: Conjunctivae normal.  Cardiovascular:     Rate and Rhythm: Normal rate and regular rhythm.     Pulses:          Radial pulses are 2+ on the right side and 2+ on the left side.       Dorsalis pedis pulses are 2+ on the right side and 2+ on the left side.  Pulmonary:     Effort: Pulmonary  effort is normal.     Breath sounds: Normal breath sounds. No decreased breath sounds, wheezing, rhonchi or rales.  Chest:     Chest wall: Tenderness present.     Comments: Right breast reconstructed, surgically absent nipple with well healing incision. No overlying skin changes. Significant TTP diffusely to right breast.  Abdominal:     Palpations: Abdomen is soft.     Tenderness: There is abdominal tenderness. There is no guarding or rebound.     Comments: Soft, + epigastric and RUQ TTP, +BS throughout, no r/g/r, neg murphy's, neg mcburney's, no CVA TTP  Musculoskeletal:     Cervical back: Neck supple.  Skin:    General: Skin is warm and dry.  Neurological:     Mental Status: She is alert.     ED Results / Procedures / Treatments   Labs (all labs ordered are listed, but only abnormal results are displayed) Labs Reviewed  BASIC METABOLIC PANEL  CBC  HEPATIC FUNCTION PANEL  LIPASE, BLOOD  TROPONIN I (HIGH SENSITIVITY)  TROPONIN I (HIGH SENSITIVITY)    EKG EKG Interpretation  Date/Time:  Sunday May 14 2020 17:27:07 EDT Ventricular Rate:  86 PR Interval:    QRS Duration: 105 QT Interval:  377 QTC Calculation: 451 R Axis:   23 Text Interpretation: Sinus rhythm Low voltage, precordial leads Probable anteroseptal infarct, old 12 Lead; Mason-Likar No significant change since last tracing Confirmed by Lacretia Leigh ([id number]) on 05/14/2020 6:14:23 PM   Radiology DG Chest 2 View  Result Date: 05/14/2020 CLINICAL DATA:  Chest pain. EXAM: CHEST - 2 VIEW COMPARISON:  November 18, 2018 FINDINGS: The heart size and mediastinal contours are within normal limits. Both lungs are clear. The visualized skeletal structures are unremarkable. IMPRESSION: No active cardiopulmonary disease. Electronically Signed   By: Dorise Bullion III M.D   On: 05/14/2020 17:51    Procedures Procedures (including critical care time)  Medications Ordered in ED Medications  sodium chloride flush (NS) 0.9  % injection 3 mL (has no administration in time range)  aspirin tablet 325 mg (has no administration  in time range)  ondansetron (ZOFRAN) injection 4 mg (has no administration in time range)  HYDROmorphone (DILAUDID) injection 0.5 mg (0.5 mg Intravenous Refused 05/14/20 2145)    ED Course  I have reviewed the triage vital signs and the nursing notes.  Pertinent labs & imaging results that were available during my care of the patient were reviewed by me and considered in my medical decision making (see chart for details).    MDM Rules/Calculators/A&P                      54 year old female with a history of breast cancer with reconstruction of her right breast last year who presents to the ED today with complaint of right breast pain as well as her episodes of vomiting that began today.  Was told by her oncologist Dr. Burr Medico to come to the ED for further evaluation.  Recently seen in the ED on 05/20 for same and had a CTA which was negative for PE.  Was started on doxycycline without any improvement due to concern for questionable infection in lung.  Had one episode of watery diarrhea yesterday however denies excessive diarrhea, doubt C. difficile at this time.  Vomiting today. On exam patient has significant chest wall/breast tenderness on the right side.  She also has some right upper quadrant epigastric pain however may be related to excessive vomiting today.  Will work-up for abdominal pain and nausea as well as ACS at this time.  I do not feel she needs additional CT scan.  She does seem overly concerned that her cancer has spread to her bones but I have advised that she needs to follow-up with her oncologist regarding this.    EKG without acute ischemic changes. CBC without leukocytosis.  Hemoglobin stable at 12.8 BMP without electrolyte abnormalities today. LFTs without any elevation. Lipase 30 Initial troponin of 2, will repeat  Initially ordered IV zofran however per nursing staff pt  refused this. She also refused the aspirin provided. Dilaudid ordered instead given significant pain however pt again refused.   Repeat troponin 2.   On reevaluation pt continues to not want anything for pain or nausea. Have offered discharge with same however pt does not want that either. Will discharge her at this time with close oncology follow up. Difficult to say what is causing pt's pain however I do not feel she needs further workup emergent work up. Strict return precautions discussed. Pt is in agreement with plan and stable for discharge home.   This note was prepared using Dragon voice recognition software and may include unintentional dictation errors due to the inherent limitations of voice recognition software.  Final Clinical Impression(s) / ED Diagnoses Final diagnoses:  Chest wall pain  Breast pain    Rx / DC Orders ED Discharge Orders    None       Discharge Instructions     Please follow up with Dr. Burr Medico early this week regarding your symptoms Return to the ED for any worsening symptoms including worsening pain, shortness of breath, excessive vomiting, severe abdominal pain, fevers > 100.4, or any other new/concerning symptoms       Eustaquio Maize, PA-C 05/14/20 2256    Lacretia Leigh, MD 05/17/20 2228

## 2020-05-16 ENCOUNTER — Telehealth: Payer: Self-pay | Admitting: Hematology

## 2020-05-16 ENCOUNTER — Telehealth: Payer: Self-pay

## 2020-05-16 NOTE — Telephone Encounter (Signed)
Scheduled appt per 5/28 sch message - mailed letter with appt date and time

## 2020-05-16 NOTE — Telephone Encounter (Signed)
[name] called stating she is having terrible pain on her right side of her chest  And right axilla.  She is also having pain in her hips and back.  She states she has had intermittent h/a and vomiting yesterday. She states she went to the ED on 5/21 and 5/30.  I told her that Dr. Burr Medico was aware of the CT results and ED visits.  I relayed to [name] Dr. Burr Medico plans a CT scan and f/u visit in 3 months.  She states she does not want to wait that long.   I tried to reassure her the CT scan done on 5/21 did not show any abnormality in the area she has the pain.

## 2020-05-17 ENCOUNTER — Telehealth: Payer: Self-pay | Admitting: Nurse Practitioner

## 2020-05-17 NOTE — Telephone Encounter (Signed)
Scheduled appt per 6/1 sch message - pt aware of appt date and time

## 2020-05-18 ENCOUNTER — Inpatient Hospital Stay: Payer: BC Managed Care – PPO | Attending: Hematology | Admitting: Nurse Practitioner

## 2020-05-18 ENCOUNTER — Other Ambulatory Visit: Payer: Self-pay

## 2020-05-18 VITALS — BP 112/72 | HR 82 | Temp 97.7°F | Resp 17 | Ht 63.0 in | Wt 184.8 lb

## 2020-05-18 DIAGNOSIS — F419 Anxiety disorder, unspecified: Secondary | ICD-10-CM | POA: Insufficient documentation

## 2020-05-18 DIAGNOSIS — R519 Headache, unspecified: Secondary | ICD-10-CM | POA: Diagnosis not present

## 2020-05-18 DIAGNOSIS — M25559 Pain in unspecified hip: Secondary | ICD-10-CM | POA: Diagnosis not present

## 2020-05-18 DIAGNOSIS — Z79811 Long term (current) use of aromatase inhibitors: Secondary | ICD-10-CM | POA: Diagnosis not present

## 2020-05-18 DIAGNOSIS — D649 Anemia, unspecified: Secondary | ICD-10-CM | POA: Insufficient documentation

## 2020-05-18 DIAGNOSIS — M79643 Pain in unspecified hand: Secondary | ICD-10-CM | POA: Diagnosis not present

## 2020-05-18 DIAGNOSIS — M898X9 Other specified disorders of bone, unspecified site: Secondary | ICD-10-CM

## 2020-05-18 DIAGNOSIS — Z79899 Other long term (current) drug therapy: Secondary | ICD-10-CM | POA: Insufficient documentation

## 2020-05-18 DIAGNOSIS — Z923 Personal history of irradiation: Secondary | ICD-10-CM | POA: Diagnosis not present

## 2020-05-18 DIAGNOSIS — Z9013 Acquired absence of bilateral breasts and nipples: Secondary | ICD-10-CM | POA: Insufficient documentation

## 2020-05-18 DIAGNOSIS — G629 Polyneuropathy, unspecified: Secondary | ICD-10-CM | POA: Diagnosis not present

## 2020-05-18 DIAGNOSIS — R232 Flushing: Secondary | ICD-10-CM | POA: Insufficient documentation

## 2020-05-18 DIAGNOSIS — R1011 Right upper quadrant pain: Secondary | ICD-10-CM | POA: Insufficient documentation

## 2020-05-18 DIAGNOSIS — C50411 Malignant neoplasm of upper-outer quadrant of right female breast: Secondary | ICD-10-CM | POA: Diagnosis not present

## 2020-05-18 DIAGNOSIS — M545 Low back pain: Secondary | ICD-10-CM | POA: Diagnosis not present

## 2020-05-18 DIAGNOSIS — Z170 Estrogen receptor positive status [ER+]: Secondary | ICD-10-CM | POA: Diagnosis not present

## 2020-05-18 DIAGNOSIS — M79601 Pain in right arm: Secondary | ICD-10-CM

## 2020-05-18 DIAGNOSIS — G4452 New daily persistent headache (NDPH): Secondary | ICD-10-CM | POA: Diagnosis not present

## 2020-05-18 DIAGNOSIS — Z9221 Personal history of antineoplastic chemotherapy: Secondary | ICD-10-CM | POA: Diagnosis not present

## 2020-05-18 DIAGNOSIS — K219 Gastro-esophageal reflux disease without esophagitis: Secondary | ICD-10-CM | POA: Insufficient documentation

## 2020-05-18 NOTE — Progress Notes (Signed)
[place]   [phone] [phone]   Clinic Follow up Note   Patient Care Team: [name] as PCP - General (Physician Assistant) Tiffany Kussmaul, MD as Consulting Physician (General Surgery) Tiffany Merle, MD as Consulting Physician (Hematology) Tiffany Pray, MD as Consulting Physician (Radiation Oncology) Tiffany Germany, RN as Oncology Nurse Navigator Tiffany Kaufmann, RN as Oncology Nurse Navigator Tiffany Feeling, NP as Nurse Practitioner (Nurse Practitioner) Date of Service: 05/18/2020   CHIEF COMPLAINT: F/u right breast cancer, pain    SUMMARY OF ONCOLOGIC HISTORY: Oncology History Overview Note  Cancer Staging Malignant neoplasm of upper-outer quadrant of right breast in female, estrogen receptor positive ([place]) Staging form: Breast, AJCC 8th Edition - Clinical stage from 01/15/2019: Stage IA (cT1b, cN0, cM0, G1, ER+, PR-, HER2-) - Signed by Tiffany Merle, MD on 01/26/2019 - Pathologic stage from 02/24/2019: Stage IB (pT1a, pN1a, cM0, G1, ER+, PR-, HER2-) - Signed by Tiffany Merle, MD on 03/18/2019     Malignant neoplasm of upper-outer quadrant of right breast in female, estrogen receptor positive ([place])  01/15/2019 Cancer Staging   Staging form: Breast, AJCC 8th Edition - Clinical stage from 01/15/2019: Stage IA (cT1b, cN0, cM0, G1, ER+, PR-, HER2-) - Signed by Tiffany Merle, MD on 01/26/2019   01/15/2019 Mammogram   Diagnostic Mammogram 01/15/19  IMPRESSION Targeted ultrasound is performed, showing right breast 10 o'clock 5 cm from the nipple hypoechoic round mass measuring 0.7 x 0.5 x 0.6 cm. Adjacent to it there is a probable complicated cyst measuring 4 mm. There is no evidence of right axillary lymphadenopathy.   01/15/2019 Initial Biopsy   Diagnosis 01/15/19  Breast, right, needle core biopsy, upper outer - INVASIVE AND IN SITU MAMMARY CARCINOMA WITH CALCIFICATIONS. - SEE COMMENT. 1 of 3 FINAL for [name]  ([id number]) Microscopic Comment The carcinoma appears grade I. The malignant cells are positive for cytokeratin AE1/AE3 and negative for E-cadherin. A breast prognostic profile will be performed and the results reported separately. Dr [name] has reviewed the case and concurs with this interpretation. The results are called to [facility] on 01/18/2019. (JBK:ah:ecj 01/18/19)   01/15/2019 Receptors her2   PROGNOSTIC INDICATORS Results: IMMUNOHISTOCHEMICAL AND MORPHOMETRIC ANALYSIS PERFORMED MANUALLY The tumor cells are NEGATIVE for Her2 (0). Estrogen Receptor: 10%, POSITIVE, MODERATE STAINING INTENSITY Progesterone Receptor: 0%, NEGATIVE Proliferation Marker Ki67: <1%   01/21/2019 Initial Diagnosis   Malignant neoplasm of upper-outer quadrant of right breast in female, estrogen receptor positive ([place])   01/29/2019 Breast MRI   IMPRESSION: 1. The biopsy-proven site of malignancy in the upper-outer quadrant of the right breast resides centrally with in an area of suspicious non mass enhancement spanning 6-7 cm.  2. There is a 6 mm suspicious enhancing mass in the lower-inner quadrant of the right breast, posterior depth.  3. There is a 9 mm indeterminate enhancing mass in the lower outer quadrant of the right breast.  4.  No evidence of malignancy in the left breast.   02/24/2019 Surgery   RIGHT MASTECTOMY WITH SENTINEL LYMPH NODE MAPPING AND LEFT PROPHYLACTIC MASTECTOMY by Dr. Marlou Snyder  BREAST RECONSTRUCTION WITH PLACEMENT OF TISSUE EXPANDER by Dr. Marla Snyder  02/24/19    02/24/2019 Pathology Results   Diagnosis 1. Breast, simple mastectomy, Left - BENIGN BREAST PARENCHYMA WITH FIBROCYSTIC CHANGE. - NEGATIVE FOR CARCINOMA. 2. Breast, simple mastectomy, Right - LOBULAR CARCINOMA IN SITU, INTERMEDIATE TO HIGH NUCLEAR GRADE. SEE NOTE. - NO EVIDENCE OF RESIDUAL  INVASIVE CARCINOMA. - RESECTION MARGINS ARE NEGATIVE FOR IN SITU OR INVASIVE CARCINOMA. - BIOPSY SITE  CHANGES. - SEE ONCOLOGY TABLE. 3. Lymph node, sentinel, biopsy, Right #1 - IMMUNOHISTOCHEMICAL STAIN FOR AE1/AE3 IS NEGATIVE FOR CARCINOMA (0/1). 4. Lymph node, sentinel, biopsy, Right - IMMUNOHISTOCHEMICAL STAIN FOR AE1/AE3 IS NEGATIVE FOR CARCINOMA (0/1). 5. Lymph node, sentinel, biopsy, Right #2 - IMMUNOHISTOCHEMICAL STAIN FOR AE1/AE3 IS NEGATIVE FOR CARCINOMA (0/1). 6. Lymph node, sentinel, biopsy, Right - IMMUNOHISTOCHEMICAL STAIN FOR AE1/AE3 IS NEGATIVE FOR CARCINOMA (0/1). 7. Lymph node, sentinel, biopsy, Right - METASTATIC CARCINOMA TO ONE LYMPH NODE (1/1) (CONFIRMED WITH IMMUNOHISTOCHEMICAL STAIN FOR AE1/AE3) - FOCUS OF METASTATIC CARCINOMA MEASURES JUST OVER 0.2 CM IN GREATEST DIMENSION - NO EVIDENCE OF EXTRANODAL EXTENSION   02/24/2019 Cancer Staging   Staging form: Breast, AJCC 8th Edition - Pathologic stage from 02/24/2019: Stage IB (pT1a, pN1a, cM0, G1, ER+, PR-, HER2-) - Signed by Tiffany Merle, MD on 03/18/2019   04/01/2019 - 06/03/2019 Chemotherapy   adjuvant Taxol and Carboplatin every 3 weeks for 4 cycles starting in 2 weeks starting 04/01/19-06/03/19   06/2019 -  Anti-estrogen oral therapy   Anastrozole 90m daily starting 06/2019. Held after 2 weeks due to hot flashes and joint pain and RT.  Switched to Exemestane in 10/2019   06/30/2019 Surgery   REMOVAL OF BILATERAL TISSUE EXPANDERS WITH PLACEMENT OF BILATERAL BREAST IMPLANTS by Dr DMarla Snyder 06/30/19    08/17/2019 - 09/28/2019 Radiation Therapy   Adjuvant Radiaiton with Dr KSondra Snyder Site Technique Total Dose (Gy) Dose per Fx (Gy) Completed Fx Beam Energies  Thorax: CW_Rt_Bst_boost Electron 10/10 2 5/5 6E  Thorax: CW_Rt 3D 50/50 2 25/25 10X, 15X, 6X  Thorax: CW_Rt_axilla Complex 50/50 2 25/25 10X     12/23/2019 Survivorship   SCP delivered by LCira Rue NP      CURRENT THERAPY: S/Snyder bilateral mastectomy and adjuvant chemo TC q3 weeks x4 cycles in 2020, started Anastrozole 111mdaily starting 06/2019, held after 2 weeks  due to hot flashes and joint pain and RT. Switched to Exemestane in 10/2019, ultimately changed to letrozole in 01/2020 due to worsening joint pain. Ultimately stopped anti-estrogen therapies in 03/2020 due to arthralgias   INTERVAL HISTORY: [name] for symptom management visit. She was last seen by me on 03/22/20 for joint pain. After long discussion anti-estrogen therapy was discontinued due to joint pain. She has since been seen in the ED on 05/04/20 for right side chest pain and dyspnea and on 05/14/20 for chest wall pain. CTA on 5/20 was negative for PE, did show questionable respiratory infection she was given doxycycline. Cardiac w/u negative x2. Notably there were no abnormal MSK findings on the CTA chest or the chest xray on 5/30.  Today, Ms. Lawson presents by herself. She is having right chest wall pain and tightness over the breast, axilla up to shoulder and neck that has worsened over last couple weeks to month. Breast implant is tender to touch. The right arm is also sore, feels like "veins are going to explode." Since stopping her letrozole in 03/2020 her hand and feet pains improved but still having hip pain and low back pain to touch. Not on statin. The back pain started 2-4 weeks ago. Denies heavy lifting, strain or fall. Denies h/o arthritis, MSK disorder, or autoimmune disorder such as lupus or RA. A month ago she developed headaches that she wakes up with and persists throughout the day. Vision is "blurry here and there" with associated altered  balance at times. Headaches are frontal or "all over." No history of headaches. She threw up once but not sure that was associated with headache. Her vomiting stopped but she lacks appetite since completing doxycycline recently. Has occasional RUQ pain. Denies bowel issues. She takes motrin nearly daily for either headaches or joint pains.    MEDICAL HISTORY:  Past Medical History:  Diagnosis Date   Anemia    Anxiety    Arthritis     Cancer ([place])    right breast   Family history of adverse reaction to anesthesia    sister has same issues   GERD (gastroesophageal reflux disease)    Neuromuscular disorder ([facility])    neuropathy   PONV (postoperative nausea and vomiting)     SURGICAL HISTORY: Past Surgical History:  Procedure Laterality Date   BREAST RECONSTRUCTION WITH PLACEMENT OF TISSUE EXPANDER AND FLEX HD (ACELLULAR HYDRATED DERMIS) Bilateral 02/24/2019   Procedure: BREAST RECONSTRUCTION WITH PLACEMENT OF TISSUE EXPANDER AND FLEX HD (ACELLULAR HYDRATED DERMIS);  Surgeon: Wallace Going, DO;  Location: [facility];  Service: Plastics;  Laterality: Bilateral;   BREAST SURGERY     left breast bx   ESOPHAGOGASTRODUODENOSCOPY (EGD) WITH ESOPHAGEAL DILATION     EYE SURGERY     dart to the eye  (right)   FACIAL FRACTURE SURGERY     FRACTURE SURGERY     KNEE SURGERY     MASTECTOMY W/ SENTINEL NODE BIOPSY Bilateral 02/24/2019   Procedure: RIGHT MASTECTOMY WITH SENTINEL LYMPH NODE MAPPING AND LEFT PROPHYLACTIC MASTECTOMY;  Surgeon: Tiffany Kussmaul, MD;  Location: [facility];  Service: General;  Laterality: Bilateral;   REMOVAL OF BILATERAL TISSUE EXPANDERS WITH PLACEMENT OF BILATERAL BREAST IMPLANTS Bilateral 06/30/2019   Procedure: REMOVAL OF BILATERAL TISSUE EXPANDERS WITH PLACEMENT OF BILATERAL BREAST IMPLANTS;  Surgeon: Wallace Going, DO;  Location: [facility];  Service: Plastics;  Laterality: Bilateral;   SHOULDER SURGERY      I have reviewed the social history and family history with the patient and they are unchanged from previous note.  ALLERGIES:  is allergic to codeine and tramadol.  MEDICATIONS:  Current Outpatient Medications  Medication Sig Dispense Refill   omeprazole (PRILOSEC) 20 MG capsule Take 20 mg by mouth daily.     venlafaxine XR (EFFEXOR-XR) 75 MG 24 hr capsule TAKE 1 CAPSULE (75 MG TOTAL) BY MOUTH DAILY WITH BREAKFAST. 90 capsule 1   zolpidem (AMBIEN) 5 MG tablet  Take 5 mg by mouth at bedtime as needed for sleep.     Cholecalciferol (VITAMIN D3) 125 MCG (5000 UT) CAPS Take 5,000 Units by mouth daily.     ibuprofen (ADVIL,MOTRIN) 200 MG tablet Take 600 mg by mouth every 6 (six) hours as needed for moderate pain.      Multiple Vitamin (MULTIVITAMIN WITH MINERALS) TABS tablet Take 1 tablet by mouth daily.     potassium chloride SA (KLOR-CON M20) 20 MEQ tablet TAKE 1 TABLET ONCE DAILY ON MONDAYS WEDNESDAYS AND FRIDAYS (Patient not taking: Reported on 05/18/2020) 30 tablet 1   No current facility-administered medications for this visit.    PHYSICAL EXAMINATION:  Vitals:   05/18/20 0949  BP: 112/72  Pulse: 82  Resp: 17  Temp: 97.7 F (36.5 C)  SpO2: 99%   Filed Weights   05/18/20 0949  Weight: 184 lb 12.8 oz (83.8 kg)    GENERAL:alert, no distress and comfortable SKIN: no rash  EYES: sclera clear NECK: without mass  LYMPH:  no  palpable cervical, supraclavicular, or axillary lymphadenopathy. Trace lymphedema in the right axilla and right arm   LUNGS: clear with normal breathing effort HEART: regular rate & rhythm, no lower extremity edema ABDOMEN:abdomen soft with normal bowel sounds. Mild TTP RUQ below breast implant  Musculoskeletal: focal tenderness in the right shoulder and lumbar spine. No lower extremity tenderness. No joint erythema. No palpable cranial abnormality or tenderness  NEURO: alert & oriented x 3 with fluent speech, no focal motor/sensory deficits. Normal gait  Breast: s/Snyder bilateral mastectomy with implants. Incisions all healed. No nodularity along the scars or chest wall. No palpable mass that I could appreciate. Tenderness to palpation on both lower implants   LABORATORY DATA:  I have reviewed the data as listed CBC Latest Ref Rng & Units 05/14/2020 05/04/2020 03/22/2020  WBC 4.0 - 10.5 K/uL 6.1 6.2 6.0  Hemoglobin 12.0 - 15.0 g/dL 12.8 12.0 12.3  Hematocrit 36.0 - 46.0 % 39.4 36.3 37.9  Platelets 150 - 400 K/uL 300 289  289     CMP Latest Ref Rng & Units 05/14/2020 05/04/2020 03/22/2020  Glucose 70 - 99 mg/dL 85 95 89  BUN 6 - 20 mg/dL '10 8 9  ' Creatinine 0.44 - 1.00 mg/dL 0.67 0.71 0.81  Sodium 135 - 145 mmol/L 141 140 144  Potassium 3.5 - 5.1 mmol/L 3.5 3.1(L) 3.7  Chloride 98 - 111 mmol/L 105 105 106  CO2 22 - 32 mmol/L '22 22 26  ' Calcium 8.9 - 10.3 mg/dL 9.8 9.5 10.4(H)  Total Protein 6.5 - 8.1 g/dL 7.7 - 7.9  Total Bilirubin 0.3 - 1.2 mg/dL 0.5 - 0.3  Alkaline Phos 38 - 126 U/L 93 - 105  AST 15 - 41 U/L 23 - 18  ALT 0 - 44 U/L 17 - 16      RADIOGRAPHIC STUDIES: I have personally reviewed the radiological images as listed and agreed with the findings in the report. No results found.   ASSESSMENT & PLAN: [name] a 54 y.o.femalewith    1. Joint pain, initially likely related to AI -Started after she began anastrozole in 06/2019 which she discontinued. Pain persisted upon switching to exemestane in 10/2019.  -Tylenol and gabapentin not completely effective, reduced the dose to gabapentin 300 mg daily due to daytime drowsiness.  -her functional status is limited due to pain, she finds it difficult to do most activities even care for her daughter with medical needs.  -when I saw her in 12/2019 I recommended to switch to tamoxifen, but she was very reluctant due to risk of thrombosis. I changed her to letrozole which she started in 01/2020  -her ER+ is 10%, benefit of AI is likely not very high. However, she does not want to stop anti-estrogen therapy altogether due to her recurrence risk  -As I suspected, she did not tolerate letrozole. She continued to have severe pain in hands, right forearm which is new as of switching to letrozole, hips, and feet. She also has more hot flashes on letrozole  -she continues effexor and low dose gabapentin for hot flashes  -after lengthy discussion in 03/2020 she and Dr. Burr Medico made the decision to discontinue anti-estrogen therapy due to poor tolerance and  likely low benefit (due to ER only 10% + moderate).  -she has persistent hip and lumbar pain, which did not improved after stopping AI. Hand/foot pain is improved. She continues to have right forearm tightness at the antecubital area, this is tender on exam.  -will obtain doppler  to r/o thrombosis, although my suspicion is low. If negative, I may refer her back to PT for Lymphedema exercises.  -she has tenderness in bilateral lower breast implants, with tenderness in the right axilla and right shoulder as well as the lumber spine.  -recent CTA chest in 04/2020 showed new ground glass opacities in the right upper lobe which we plan to monitor with short interval CT chest in 07/2020. No abnormality surrounding the right breast implant. I recommend she go back to plastic surgeon for bilateral tenderness at the implants -she is being referred for bone scan for further evaluation of her bone pain.  -will f/u with results as work up is complete, f/u in a month in person  2. Headaches  -Onset 1 month ago, no prior h/o headaches -associated with intermittent blurry vision, balance issues and 1 episode of emesis  -wakes up with headache, waxes and wanes throughout the day. Mostly frontal and  -takes tylenol/NSAID almost daily  -exam is benign -I recommend to try to avoid daily medication as there most likely is a med overuse component -given her h/o breast cancer, will obtain brain imaging to r/o metastatic disease. If negative, I will likely refer her to Dr. Mickeal Skinner neuro onc for headaches   3.Malignant neoplasm of upper-outer quadrant of right breast,invasive lobular carcinoma, stageIA,Snyder(T1a,N1a,M0), ER10%+, PR-, HER2-Grade I -She was diagnosed in 12/2018. She is s/Snyder b/l mastectomyon3/11/20.Pathology showedmargins were negative, however she has 1/5 positive lymph nodes.  -Shecompletedadjuvant chemo with TC every 3 weeks for 4 cycles.  -She underwent breast reconstruction with implant  placements on 06/30/19 -She started antiestrogentherapy with Anastrozolein 06/2019. She tolerated poorly with significant hot flash, and joint stiffness. She stopped after 2 weeksand eventually started exemestane in 10/2019 after RT. She did not tolerate exemestane well either, and ultimately changed to letrozole but ultimately discontinued altogether in 03/2020 due to poor tolerance secondary to arthralgias. Her ER is only 10% positive, ER and HER2 are negative, her breast cancer behaves more like triple negative. -S/pAdjuvantRadiationwith Dr [name] on 08/17/19-09/28/19.  -on breast cancer surveillance  -Labs reviewed, CBC and CMP normal. Breast exam benign except painful implants and very mild lymph edema in the right axilla. Her other concerns are pending further work up. If negative, continue breast cancer surveillance.   PLAN: -Labs reviewed -further work up with bone scan, Snyder doppler, and MRI brain w/wo contrast  -Reduce tylenol/NSAIDs in case headaches are related to medication overuse  -F/u with results as they Snyder in, office visit in 4 weeks  -The case was discussed with Dr. Burr Medico   No problem-specific Assessment & Plan notes found for this encounter.   Orders Placed This Encounter  Procedures   MR Brain Wo Contrast    Standing Status:   Future    Standing Expiration Date:   05/22/2021    Order Specific Question:   ** REASON FOR EXAM (FREE TEXT)    Answer:   1 mo h/o headaches, h/o right breast cancer    Order Specific Question:   What is the patient's sedation requirement?    Answer:   No Sedation    Order Specific Question:   Does the patient have a pacemaker or implanted devices?    Answer:   No    Order Specific Question:   Use SRS Protocol?    Answer:   No    Order Specific Question:   Preferred imaging location?    Answer:   [facility] (table limit - 550  lbs)    Order Specific Question:   Radiology Contrast Protocol - do NOT remove file path    Answer:    \[email or web address]   MR Brain W Wo Contrast    Standing Status:   Future    Standing Expiration Date:   05/23/2021    Order Specific Question:   ** REASON FOR EXAM (FREE TEXT)    Answer:   new onset headaches, off balance patient with h/o breast cancer    Order Specific Question:   If indicated for the ordered procedure, I authorize the administration of contrast media per Radiology protocol    Answer:   Yes    Order Specific Question:   What is the patient's sedation requirement?    Answer:   No Sedation    Order Specific Question:   Does the patient have a pacemaker or implanted devices?    Answer:   No    Order Specific Question:   Use SRS Protocol?    Answer:   No    Order Specific Question:   Radiology Contrast Protocol - do NOT remove file path    Answer:   \[email or web address]    Order Specific Question:   Preferred imaging location?    Answer:   [facility] (table limit - 550 lbs)   NM Bone Scan Whole Body    Standing Status:   Future    Standing Expiration Date:   05/23/2021    Order Specific Question:   ** REASON FOR EXAM (FREE TEXT)    Answer:   persistent and diffuse bone pain in right shoulder, neck and right arm, hips after stopping AI, r/o metastatic disease (h/o breast cancer)    Order Specific Question:   If indicated for the ordered procedure, I authorize the administration of a radiopharmaceutical per Radiology protocol    Answer:   Yes    Order Specific Question:   Is the patient pregnant?    Answer:   No    Order Specific Question:   Preferred imaging location?    Answer:   [facility]    Order Specific Question:   Radiology Contrast Protocol - do NOT remove file path    Answer:   \[email or web address]   All questions were answered. The patient knows to call the clinic with any problems, questions or concerns. No barriers to learning was detected. Total encounter time was 40  minutes.      Tiffany Feeling, NP 05/23/20

## 2020-05-23 ENCOUNTER — Telehealth: Payer: Self-pay

## 2020-05-23 ENCOUNTER — Encounter: Payer: Self-pay | Admitting: Nurse Practitioner

## 2020-05-23 NOTE — Telephone Encounter (Signed)
Per Cira Rue NP called central scheduling [phone]) and spoke with Elberta Fortis. I let him know that [facility] changed MR brain to w/wo contrast but can't cancel mr brain wo contrast bc it is already scheduled for 6/18. So MRI on 6/18 should be w and wo contrast. Elberta Fortis verbalized appointment change was corrected.

## 2020-05-23 NOTE — Telephone Encounter (Signed)
[name] called stating she has been scheduled for MRI but thought she was going to have ultrasound and bone scan.  She wants clarification.

## 2020-05-23 NOTE — Telephone Encounter (Signed)
TC to pt per Cira Rue NP to let her know that Regan Rakers is working on her orders. The bone scan and RUE doppler should be scheduled soon. Also let her know taht [facility] reviewed her case with Dr. Burr Medico, and they don't feel like a breast/implant ultrasound will give them any more information about the breast/axillary pain than the CTA that was done recently and did not show abnormality (no abscess or seroma on CT). I let her know that they recommend to call plastic surgeon about breast pain. Will be in touch with her results soon. Patient verbalized understanding. No further problems or concerns at this time.

## 2020-05-23 NOTE — Telephone Encounter (Signed)
I am working on her orders. The bone scan and RUE doppler should be scheduled soon. I reviewed her case with Dr. Burr Medico, we don't feel like a breast/implant ultrasound will give [place] any more information about the breast/axillary pain than the CTA that was done recently and did not show abnormality (no abscess or seroma on CT). We recommend to call plastic surgeon about breast pain. Will be in touch with her results soon.   [name] please let her know.  Thanks, LB

## 2020-05-24 ENCOUNTER — Telehealth: Payer: Self-pay | Admitting: Nurse Practitioner

## 2020-05-24 NOTE — Telephone Encounter (Signed)
Scheduled per 6/8 sch message. Pt aware of appt added on 6/29

## 2020-05-30 ENCOUNTER — Other Ambulatory Visit: Payer: Self-pay

## 2020-05-30 ENCOUNTER — Telehealth: Payer: Self-pay

## 2020-05-30 ENCOUNTER — Ambulatory Visit (HOSPITAL_COMMUNITY)
Admission: RE | Admit: 2020-05-30 | Discharge: 2020-05-30 | Disposition: A | Discharge status: Home / Self Care | Payer: BC Managed Care – PPO | Source: Ambulatory Visit | Attending: Nurse Practitioner | Admitting: Nurse Practitioner

## 2020-05-30 ENCOUNTER — Other Ambulatory Visit: Payer: Self-pay | Admitting: Hematology

## 2020-05-30 DIAGNOSIS — M79601 Pain in right arm: Secondary | ICD-10-CM

## 2020-05-30 MED ORDER — LORAZEPAM 1 MG PO TABS
1.0000 mg | ORAL_TABLET | Freq: Once | ORAL | 0 refills | Status: DC | PRN
Start: 2020-05-30 — End: 2021-11-12

## 2020-05-30 NOTE — Telephone Encounter (Signed)
[name] left vm requesting antianxiety med to take prior to MRI on Friday 6/18.  She states she is claustrophobic.

## 2020-05-30 NOTE — Progress Notes (Signed)
Upper venous duplex       has been completed. Preliminary results can be found under CV proc through chart review. Osmond Steckman, BS, RDMS, RVT   

## 2020-05-30 NOTE — Telephone Encounter (Signed)
I left a vm for [name].  I told her a prescription for ativan has been sent to her pharmacy.  I reviewed instructions.

## 2020-06-01 ENCOUNTER — Telehealth: Payer: Self-pay

## 2020-06-01 NOTE — Telephone Encounter (Signed)
TC to pt per Tiffany Rue NP to let her know that her  doppler is negative for DVT in the upper extremities. If she is still having right arm pain [name] said that she could try PT, let her know that she may benefit from LE sleeve although her degree of edema is minimal. If she is interested I let her know that [name] could refer her. Patient verbalized understanding and stated that she was not interested in PT or LE sleeve.

## 2020-06-02 ENCOUNTER — Ambulatory Visit (HOSPITAL_COMMUNITY)
Admission: RE | Admit: 2020-06-02 | Discharge: 2020-06-02 | Disposition: A | Discharge status: Home / Self Care | Payer: BC Managed Care – PPO | Source: Ambulatory Visit | Attending: Nurse Practitioner | Admitting: Nurse Practitioner

## 2020-06-02 ENCOUNTER — Other Ambulatory Visit: Payer: Self-pay

## 2020-06-02 DIAGNOSIS — G4452 New daily persistent headache (NDPH): Secondary | ICD-10-CM | POA: Diagnosis present

## 2020-06-02 MED ORDER — GADOBUTROL 1 MMOL/ML IV SOLN
8.0000 mL | Freq: Once | INTRAVENOUS | Status: AC | PRN
  Administered 2020-06-02: 8 mL via INTRAVENOUS

## 2020-06-05 ENCOUNTER — Other Ambulatory Visit: Payer: Self-pay

## 2020-06-05 ENCOUNTER — Ambulatory Visit (HOSPITAL_COMMUNITY)
Admission: RE | Admit: 2020-06-05 | Discharge: 2020-06-05 | Disposition: A | Discharge status: Home / Self Care | Payer: BC Managed Care – PPO | Source: Ambulatory Visit | Attending: Nurse Practitioner | Admitting: Nurse Practitioner

## 2020-06-05 ENCOUNTER — Encounter (HOSPITAL_COMMUNITY)
Admission: RE | Admit: 2020-06-05 | Discharge: 2020-06-05 | Disposition: A | Discharge status: Home / Self Care | Payer: BC Managed Care – PPO | Source: Ambulatory Visit | Attending: Nurse Practitioner | Admitting: Nurse Practitioner

## 2020-06-05 ENCOUNTER — Encounter (HOSPITAL_COMMUNITY): Admission: RE | Admit: 2020-06-05 | Payer: BC Managed Care – PPO | Source: Ambulatory Visit

## 2020-06-05 DIAGNOSIS — M898X9 Other specified disorders of bone, unspecified site: Secondary | ICD-10-CM | POA: Diagnosis present

## 2020-06-05 MED ORDER — TECHNETIUM TC 99M MEDRONATE IV KIT
22.1000 | PACK | Freq: Once | INTRAVENOUS | Status: AC | PRN
  Administered 2020-06-05: 22.1 via INTRAVENOUS

## 2020-06-07 ENCOUNTER — Telehealth: Payer: Self-pay | Admitting: *Deleted

## 2020-06-07 NOTE — Telephone Encounter (Signed)
Pt left message requesting results of MRI and Bone scan.

## 2020-06-07 NOTE — Telephone Encounter (Signed)
Explained that Regan Rakers will review on Tuesday. She is able to see on My Chart. Pt appreciative of call

## 2020-06-13 ENCOUNTER — Telehealth: Payer: Self-pay | Admitting: Nurse Practitioner

## 2020-06-13 ENCOUNTER — Inpatient Hospital Stay: Payer: BC Managed Care – PPO | Admitting: Nurse Practitioner

## 2020-06-13 NOTE — Telephone Encounter (Signed)
Called patient to give results of bone scan and brain MRI. She questions if RUQ pain below breast implant could be related to gall bladder. Will discuss further at office visit this week. She understands and appreciates the call.   Cira Rue, NP

## 2020-06-15 ENCOUNTER — Encounter: Payer: Self-pay | Admitting: Nurse Practitioner

## 2020-06-15 ENCOUNTER — Inpatient Hospital Stay: Payer: BC Managed Care – PPO | Attending: Hematology | Admitting: Nurse Practitioner

## 2020-06-15 ENCOUNTER — Other Ambulatory Visit: Payer: Self-pay

## 2020-06-15 VITALS — BP 118/78 | HR 76 | Temp 98.1°F | Resp 17 | Ht 63.0 in | Wt 183.1 lb

## 2020-06-15 DIAGNOSIS — F419 Anxiety disorder, unspecified: Secondary | ICD-10-CM | POA: Diagnosis not present

## 2020-06-15 DIAGNOSIS — Z923 Personal history of irradiation: Secondary | ICD-10-CM | POA: Diagnosis not present

## 2020-06-15 DIAGNOSIS — Z9221 Personal history of antineoplastic chemotherapy: Secondary | ICD-10-CM | POA: Insufficient documentation

## 2020-06-15 DIAGNOSIS — G629 Polyneuropathy, unspecified: Secondary | ICD-10-CM | POA: Insufficient documentation

## 2020-06-15 DIAGNOSIS — Z170 Estrogen receptor positive status [ER+]: Secondary | ICD-10-CM | POA: Insufficient documentation

## 2020-06-15 DIAGNOSIS — M25619 Stiffness of unspecified shoulder, not elsewhere classified: Secondary | ICD-10-CM | POA: Diagnosis not present

## 2020-06-15 DIAGNOSIS — D649 Anemia, unspecified: Secondary | ICD-10-CM | POA: Diagnosis not present

## 2020-06-15 DIAGNOSIS — Z79899 Other long term (current) drug therapy: Secondary | ICD-10-CM | POA: Insufficient documentation

## 2020-06-15 DIAGNOSIS — C50411 Malignant neoplasm of upper-outer quadrant of right female breast: Secondary | ICD-10-CM

## 2020-06-15 DIAGNOSIS — R232 Flushing: Secondary | ICD-10-CM | POA: Diagnosis not present

## 2020-06-15 DIAGNOSIS — Z79811 Long term (current) use of aromatase inhibitors: Secondary | ICD-10-CM | POA: Insufficient documentation

## 2020-06-15 DIAGNOSIS — R918 Other nonspecific abnormal finding of lung field: Secondary | ICD-10-CM | POA: Insufficient documentation

## 2020-06-15 DIAGNOSIS — K5900 Constipation, unspecified: Secondary | ICD-10-CM | POA: Diagnosis not present

## 2020-06-15 DIAGNOSIS — Z9013 Acquired absence of bilateral breasts and nipples: Secondary | ICD-10-CM | POA: Diagnosis not present

## 2020-06-15 DIAGNOSIS — R51 Headache with orthostatic component, not elsewhere classified: Secondary | ICD-10-CM | POA: Insufficient documentation

## 2020-06-15 DIAGNOSIS — K219 Gastro-esophageal reflux disease without esophagitis: Secondary | ICD-10-CM | POA: Diagnosis not present

## 2020-06-15 DIAGNOSIS — M129 Arthropathy, unspecified: Secondary | ICD-10-CM | POA: Diagnosis not present

## 2020-06-15 DIAGNOSIS — R079 Chest pain, unspecified: Secondary | ICD-10-CM | POA: Diagnosis not present

## 2020-06-15 NOTE — Progress Notes (Signed)
[place]   [phone]) [phone] [phone]   Clinic Follow up Note   Patient Care Team: Merwyn Katos as PCP - General (Physician Assistant) Jovita Kussmaul, MD as Consulting Physician (General Surgery) Truitt Merle, MD as Consulting Physician (Hematology) Gery Pray, MD as Consulting Physician (Radiation Oncology) Rockwell Germany, RN as Oncology Nurse Navigator Mauro Kaufmann, RN as Oncology Nurse Navigator Alla Feeling, NP as Nurse Practitioner (Nurse Practitioner) 06/15/2020  CHIEF COMPLAINT: Follow-up right breast cancer, pain, headaches  SUMMARY OF ONCOLOGIC HISTORY: Oncology History Overview Note  Cancer Staging Malignant neoplasm of upper-outer quadrant of right breast in female, estrogen receptor positive ([place]) Staging form: Breast, AJCC 8th Edition - Clinical stage from 01/15/2019: Stage IA (cT1b, cN0, cM0, G1, ER+, PR-, HER2-) - Signed by Truitt Merle, MD on 01/26/2019 - Pathologic stage from 02/24/2019: Stage IB (pT1a, pN1a, cM0, G1, ER+, PR-, HER2-) - Signed by Truitt Merle, MD on 03/18/2019     Malignant neoplasm of upper-outer quadrant of right breast in female, estrogen receptor positive ([place])  01/15/2019 Cancer Staging   Staging form: Breast, AJCC 8th Edition - Clinical stage from 01/15/2019: Stage IA (cT1b, cN0, cM0, G1, ER+, PR-, HER2-) - Signed by Truitt Merle, MD on 01/26/2019   01/15/2019 Mammogram   Diagnostic Mammogram 01/15/19  IMPRESSION Targeted ultrasound is performed, showing right breast 10 o'clock 5 cm from the nipple hypoechoic round mass measuring 0.7 x 0.5 x 0.6 cm. Adjacent to it there is a probable complicated cyst measuring 4 mm. There is no evidence of right axillary lymphadenopathy.   01/15/2019 Initial Biopsy   Diagnosis 01/15/19  Breast, right, needle core biopsy, upper outer - INVASIVE AND IN SITU MAMMARY CARCINOMA WITH CALCIFICATIONS. - SEE COMMENT. 1 of 3 FINAL for Depasquale, Kresha P  ([id number]) Microscopic Comment The carcinoma appears grade I. The malignant cells are positive for cytokeratin AE1/AE3 and negative for E-cadherin. A breast prognostic profile will be performed and the results reported separately. Dr Vicente Males has reviewed the case and concurs with this interpretation. The results are called to [facility] on 01/18/2019. (JBK:ah:ecj 01/18/19)   01/15/2019 Receptors her2   PROGNOSTIC INDICATORS Results: IMMUNOHISTOCHEMICAL AND MORPHOMETRIC ANALYSIS PERFORMED MANUALLY The tumor cells are NEGATIVE for Her2 (0). Estrogen Receptor: 10%, POSITIVE, MODERATE STAINING INTENSITY Progesterone Receptor: 0%, NEGATIVE Proliferation Marker Ki67: <1%   01/21/2019 Initial Diagnosis   Malignant neoplasm of upper-outer quadrant of right breast in female, estrogen receptor positive ([place])   01/29/2019 Breast MRI   IMPRESSION: 1. The biopsy-proven site of malignancy in the upper-outer quadrant of the right breast resides centrally with in an area of suspicious non mass enhancement spanning 6-7 cm.  2. There is a 6 mm suspicious enhancing mass in the lower-inner quadrant of the right breast, posterior depth.  3. There is a 9 mm indeterminate enhancing mass in the lower outer quadrant of the right breast.  4.  No evidence of malignancy in the left breast.   02/24/2019 Surgery   RIGHT MASTECTOMY WITH SENTINEL LYMPH NODE MAPPING AND LEFT PROPHYLACTIC MASTECTOMY by Dr. Marlou Starks  BREAST RECONSTRUCTION WITH PLACEMENT OF TISSUE EXPANDER by Dr. Marla Roe  02/24/19    02/24/2019 Pathology Results   Diagnosis 1. Breast, simple mastectomy, Left - BENIGN BREAST PARENCHYMA WITH FIBROCYSTIC CHANGE. - NEGATIVE FOR CARCINOMA. 2. Breast, simple mastectomy, Right - LOBULAR CARCINOMA IN SITU, INTERMEDIATE TO HIGH NUCLEAR GRADE. SEE NOTE. - NO EVIDENCE OF RESIDUAL INVASIVE CARCINOMA. - RESECTION MARGINS  ARE NEGATIVE FOR IN SITU OR INVASIVE CARCINOMA. - BIOPSY SITE  CHANGES. - SEE ONCOLOGY TABLE. 3. Lymph node, sentinel, biopsy, Right #1 - IMMUNOHISTOCHEMICAL STAIN FOR AE1/AE3 IS NEGATIVE FOR CARCINOMA (0/1). 4. Lymph node, sentinel, biopsy, Right - IMMUNOHISTOCHEMICAL STAIN FOR AE1/AE3 IS NEGATIVE FOR CARCINOMA (0/1). 5. Lymph node, sentinel, biopsy, Right #2 - IMMUNOHISTOCHEMICAL STAIN FOR AE1/AE3 IS NEGATIVE FOR CARCINOMA (0/1). 6. Lymph node, sentinel, biopsy, Right - IMMUNOHISTOCHEMICAL STAIN FOR AE1/AE3 IS NEGATIVE FOR CARCINOMA (0/1). 7. Lymph node, sentinel, biopsy, Right - METASTATIC CARCINOMA TO ONE LYMPH NODE (1/1) (CONFIRMED WITH IMMUNOHISTOCHEMICAL STAIN FOR AE1/AE3) - FOCUS OF METASTATIC CARCINOMA MEASURES JUST OVER 0.2 CM IN GREATEST DIMENSION - NO EVIDENCE OF EXTRANODAL EXTENSION   02/24/2019 Cancer Staging   Staging form: Breast, AJCC 8th Edition - Pathologic stage from 02/24/2019: Stage IB (pT1a, pN1a, cM0, G1, ER+, PR-, HER2-) - Signed by Truitt Merle, MD on 03/18/2019   04/01/2019 - 06/03/2019 Chemotherapy   adjuvant Taxol and Carboplatin every 3 weeks for 4 cycles starting in 2 weeks starting 04/01/19-06/03/19   06/2019 -  Anti-estrogen oral therapy   Anastrozole 95m daily starting 06/2019. Held after 2 weeks due to hot flashes and joint pain and RT.  Switched to Exemestane in 10/2019   06/30/2019 Surgery   REMOVAL OF BILATERAL TISSUE EXPANDERS WITH PLACEMENT OF BILATERAL BREAST IMPLANTS by Dr DMarla Roe 06/30/19    08/17/2019 - 09/28/2019 Radiation Therapy   Adjuvant Radiaiton with Dr KSondra Come Site Technique Total Dose (Gy) Dose per Fx (Gy) Completed Fx Beam Energies  Thorax: CW_Rt_Bst_boost Electron 10/10 2 5/5 6E  Thorax: CW_Rt 3D 50/50 2 25/25 10X, 15X, 6X  Thorax: CW_Rt_axilla Complex 50/50 2 25/25 10X     12/23/2019 Survivorship   SCP delivered by LCira Rue NP    06/02/2020 Imaging   MRI brain with and without contrast IMPRESSION: 1. No acute intracranial abnormality or evidence of metastatic disease. 2. Mild cerebral  white matter T2 signal changes, nonspecific though may reflect chronic small vessel ischemic disease, migraines, or prior infection/inflammation.   06/06/2020 Imaging   Bone scan IMPRESSION: No definite scintigraphic evidence of osseous metastases. Mildly abnormal uptake is noted in both knees and feet most consistent with degenerative change.     INTERVAL HISTORY: Ms. Tiffany Junkerreturns for follow-up on her imaging as scheduled.  She underwent a bone scan and brain MRI that were negative.  Her headaches are "better."  She continues to have neck and shoulder stiffness she relates to her posture.  The pain she described in her right breast implant/chest now has moved down to the right upper abdomen.  She reports pain after meals and sporadically.  Her appetite is low. She has constipation, BM every 2-3 days with medication.  Denies nausea or vomiting.  She called her PCP and asked for a scan to determine if pain is related to her gallbladder, [facility]   Otherwise, she denies fever, chills, cough, chest pain, shortness of breath.   MEDICAL HISTORY:  Past Medical History:  Diagnosis Date  . Anemia   . Anxiety   . Arthritis   . Cancer ([facility]    right breast  . Family history of adverse reaction to anesthesia    sister has same issues  . GERD (gastroesophageal reflux disease)   . Neuromuscular disorder ([facility])    neuropathy  . PONV (postoperative nausea and vomiting)     SURGICAL HISTORY: Past Surgical History:  Procedure Laterality Date  .  BREAST RECONSTRUCTION WITH PLACEMENT OF TISSUE EXPANDER AND FLEX HD (ACELLULAR HYDRATED DERMIS) Bilateral 02/24/2019   Procedure: BREAST RECONSTRUCTION WITH PLACEMENT OF TISSUE EXPANDER AND FLEX HD (ACELLULAR HYDRATED DERMIS);  Surgeon: Wallace Going, DO;  Location: [facility];  Service: Plastics;  Laterality: Bilateral;  . BREAST SURGERY     left breast bx  . ESOPHAGOGASTRODUODENOSCOPY (EGD) WITH ESOPHAGEAL DILATION    . EYE  SURGERY     dart to the eye  (right)  . FACIAL FRACTURE SURGERY    . FRACTURE SURGERY    . KNEE SURGERY    . MASTECTOMY W/ SENTINEL NODE BIOPSY Bilateral 02/24/2019   Procedure: RIGHT MASTECTOMY WITH SENTINEL LYMPH NODE MAPPING AND LEFT PROPHYLACTIC MASTECTOMY;  Surgeon: Jovita Kussmaul, MD;  Location: [place];  Service: General;  Laterality: Bilateral;  . REMOVAL OF BILATERAL TISSUE EXPANDERS WITH PLACEMENT OF BILATERAL BREAST IMPLANTS Bilateral 06/30/2019   Procedure: REMOVAL OF BILATERAL TISSUE EXPANDERS WITH PLACEMENT OF BILATERAL BREAST IMPLANTS;  Surgeon: Wallace Going, DO;  Location: [facility];  Service: Plastics;  Laterality: Bilateral;  . SHOULDER SURGERY      I have reviewed the social history and family history with the patient and they are unchanged from previous note.  ALLERGIES:  is allergic to codeine and tramadol.  MEDICATIONS:  Current Outpatient Medications  Medication Sig Dispense Refill  . Cholecalciferol (VITAMIN D3) 125 MCG (5000 UT) CAPS Take 5,000 Units by mouth daily.    Marland Kitchen ibuprofen (ADVIL,MOTRIN) 200 MG tablet Take 600 mg by mouth every 6 (six) hours as needed for moderate pain.     Marland Kitchen LORazepam (ATIVAN) 1 MG tablet Take 1 tablet (1 mg total) by mouth once as needed for up to 1 dose for anxiety (before MRI). Take 1 tab 30-60 mins before MRI, OK to take second one before MRI if needed 2 tablet 0  . Multiple Vitamin (MULTIVITAMIN WITH MINERALS) TABS tablet Take 1 tablet by mouth daily.    Marland Kitchen omeprazole (PRILOSEC) 20 MG capsule Take 20 mg by mouth daily.    . potassium chloride SA (KLOR-CON M20) 20 MEQ tablet TAKE 1 TABLET ONCE DAILY ON MONDAYS WEDNESDAYS AND FRIDAYS 30 tablet 1  . venlafaxine XR (EFFEXOR-XR) 75 MG 24 hr capsule TAKE 1 CAPSULE (75 MG TOTAL) BY MOUTH DAILY WITH BREAKFAST. 90 capsule 1  . zolpidem (AMBIEN) 5 MG tablet Take 5 mg by mouth at bedtime as needed for sleep.     No current facility-administered medications for this visit.     PHYSICAL EXAMINATION:  Vitals:   06/15/20 1524  BP: 118/78  Pulse: 76  Resp: 17  Temp: 98.1 F (36.7 C)  SpO2: 98%   Filed Weights   06/15/20 1524  Weight: 183 lb 1.6 oz (83.1 kg)    GENERAL:alert, no distress and comfortable SKIN: No rash to exposed skin EYES: sclera clear NECK: Without mass LUNGS: clear with normal breathing effort HEART: regular rate & rhythm, no lower extremity edema ABDOMEN: abdomen soft with normal bowel sounds.  Mild tenderness to RUQ, no hepatomegaly or mass NEURO: alert & oriented x 3 with fluent speech Breast exam deferred  LABORATORY DATA:  I have reviewed the data as listed CBC Latest Ref Rng & Units 05/14/2020 05/04/2020 03/22/2020  WBC 4.0 - 10.5 K/uL 6.1 6.2 6.0  Hemoglobin 12.0 - 15.0 g/dL 12.8 12.0 12.3  Hematocrit 36 - 46 % 39.4 36.3 37.9  Platelets 150 - 400 K/uL 300 289 289  CMP Latest Ref Rng & Units 05/14/2020 05/04/2020 03/22/2020  Glucose 70 - 99 mg/dL 85 95 89  BUN 6 - 20 mg/dL _0 Creatinine 0.44 - 1.00 mg/dL 0.67 0.71 0.81  Sodium 135 - 145 mmol/L 141 140 144  Potassium 3.5 - 5.1 mmol/L 3.5 3.1(L) 3.7  Chloride 98 - 111 mmol/L 105 105 106  CO2 22 - 32 mmol/L _1 Calcium 8.9 - 10.3 mg/dL 9.8 9.5 10.4(H)  Total Protein 6.5 - 8.1 g/dL 7.7 - 7.9  Total Bilirubin 0.3 - 1.2 mg/dL 0.5 - 0.3  Alkaline Phos 38 - 126 U/L 93 - 105  AST 15 - 41 U/L 23 - 18  ALT 0 - 44 U/L 17 - 16      RADIOGRAPHIC STUDIES: I have personally reviewed the radiological images as listed and agreed with the findings in the report. No results found.   ASSESSMENT & PLAN: Codi Folkerts Peedenis a 54 y.o.femalewith    1. Joint pain, initially likely related to AI, pain at implants, and RUQ pain -Started after she began anastrozole in 06/2019 which she discontinued. Pain persisted upon switching to exemestane in 10/2019.  -Tylenol and gabapentin not completely effective, reduced the dose to gabapentin 300 mg daily due to daytime  drowsiness. -her functional status is limited due to pain, she finds it difficult to do most activities even care for her daughter with medical needs. -when I saw her in 12/2019 I recommended to switch to tamoxifen, but she was very reluctant due to risk of thrombosis. I changed her to letrozole which she started in 01/2020 -her ER+ is 10%, benefit of AI is likely not very high. However, she did not want to stop anti-estrogen therapy altogetherdue to her recurrence risk  -As I suspected, she did not tolerate letrozole. She continued to have severe pain in hands and developed painful right forearm when she switched to letrozole, hips, and feet. She also has more hot flashes on letrozole  -she continues effexor and low dose gabapentin for hot flashes  -after lengthy discussion in 03/2020 she and Dr. Burr Medico made the decision to discontinue anti-estrogen therapy due to poor tolerance and likely low benefit (due to ER only 10% + moderate).  -she has persistent hip and lumbar pain, which did not improved after stopping AI. Hand/foot pain is improved. She continues to have right upper and forearm tightness which is is tender on exam.  -Doppler was negative for DVT. She does not have obvious lymphedema, I think referral back to PT would not be of much benefit.  On 05/18/20 she had tenderness in bilateral lower breast implants, with tenderness in the right axilla and right shoulder as well as the lumber spine.  -recent CTA chest in 04/2020 showed new ground glass opacities in the right upper lobe which we plan to monitor with short interval CT chest in 07/2020. No abnormality surrounding the right breast implant. I recommend she go back to plastic surgeon for bilateral tenderness at the implants -Her bone scan was negative except what was felt to represent degenerative uptake in the knees.  -Today Ms. Rion appears unchanged. She still has pain in right breast/chest but her main concern is RUQ pain that occurs  sporadically and after meals. She called her PCP and reportedly has an evaluation (? [place]) at [facility] to r/o gallbladder issues. Will call PCP to see what is being done, patient wants this at [facility] than [facility].  -If this is gallbladder  related, I recommend to reduce fat and eat bland diet. I also encouraged Ms. Wescott to call her GI Dr. Tarri Glenn  -I have low suspicion this is related to her previous breast cancer.  -will f/u on imaging per PCP. If she has persistent pain next month, will add CT AP to the CT chest.    2. Headaches  -Onset 04/2020, no prior h/o headaches -associated with intermittent blurry vision, balance issues and 1 episode of emesis  -wakes up with headache, waxes and wanes throughout the day. Mostly frontal and  -takes tylenol/NSAID almost daily as of 05/18/20 -I recommended to try to avoid daily medication as there most likely is a med overuse component -given her h/o breast cancer, I obtained MRI brain w/wo contrast which was negative for brain metastasis but showed mild nonspecific white matter T2 signal changes possibly reflecting chronic small vessel ischemic disease, migraines, or prior infection/inflammation.  -headaches improved, she declined referral to neuro onc at this time -will monitor  3.Malignant neoplasm of upper-outer quadrant of right breast,invasive lobular carcinoma, stageIA,p(T1a,N1a,M0), ER10%+, PR-, HER2-Grade I -She was diagnosed in 12/2018. She is s/p b/l mastectomyon3/11/20.Pathology showedmargins were negative, however she has 1/5 positive lymph nodes.  -Shecompletedadjuvant chemo with TC every 3 weeks for 4 cycles.  -She underwent breast reconstruction with implant placements on 06/30/19 -She started antiestrogentherapy with Anastrozolein 06/2019. She tolerated poorly with significant hot flash, and joint stiffness. She stopped after 2 weeksand eventually started exemestane in 10/2019 after RT. She did not tolerate exemestane well either, and  ultimately changed to letrozole but ultimately discontinued altogether in 03/2020 due to poor tolerance secondary to arthralgias. Her ER is only 10% positive, ER and HER2 are negative, her breast cancer behaves more like triple negative. -S/pAdjuvantRadiationwith Dr Sondra Come on 08/17/19-09/28/19.  -on breast cancer surveillance  -exam on 05/18/20 showed tenderness at palpation at bilateral lower breast/implants, no palpable mass or concern for recurrence  -recent brain MRI 06/02/20 and bone scan 06/06/20 negative -continue surveillance  PLAN: -F/u imaging per PCP, patient requesting imaging to be done at [facility]. My nurse has called the office to f/u on this. I also encouraged Ms. Bloodgood to call her PCP to get this moved -Patient to call GI Dr. Tarri Glenn -If pain persists, add CT Abdomen to CT chest in 07/2020  -nonfat/bland diet -F/u 07/2020 after CT as scheduled  -hold referral to neuro onc or PT at this time  All questions were answered. The patient knows to call the clinic with any problems, questions or concerns. No barriers to learning was detected.     Alla Feeling, NP 06/15/20

## 2020-06-16 ENCOUNTER — Telehealth: Payer: Self-pay | Admitting: *Deleted

## 2020-06-16 ENCOUNTER — Telehealth: Payer: Self-pay | Admitting: Nurse Practitioner

## 2020-06-16 NOTE — Telephone Encounter (Signed)
Returned phone call from [employer].

## 2020-06-16 NOTE — Telephone Encounter (Signed)
No 7/1 los

## 2020-06-17 ENCOUNTER — Other Ambulatory Visit: Payer: Self-pay | Admitting: Surgical

## 2020-06-21 ENCOUNTER — Other Ambulatory Visit: Payer: BC Managed Care – PPO

## 2020-07-14 ENCOUNTER — Telehealth: Payer: Self-pay

## 2020-07-14 NOTE — Telephone Encounter (Signed)
I spoke with Dr. Burr Medico in regards of covid dx and Dr. Burr Medico recommends that she follows what her PCP says and f/u with them for dx due to our office does not specialize in that dx. Dr. Burr Medico also states she recommends that Pt stays hydrated and checks her pulse ox at home and if it gets below 92 to go to the ED. Dr.Feng said if she did not have an pulse ox she can get one online but that would be her recommendation on what to do.   I called [name] and notified her of above and [name] verbalized understanding and that she has an pulse ox at home.

## 2020-07-14 NOTE — Telephone Encounter (Signed)
[name] called stating that she tested Positive for covid and she wants someone to call her back.   I called Ms. Leeder and asked her where she got tested and she states that she took an at home covid test on 7/20 and it came back positive, Ms. Walla then begins to say that she is blowing up greenish stuff and that she does not think this is related to her covid dx that it has  turned into an sinus infection and she wants Dr. Burr Medico to call in an antibiotic for her. I informed Ms. Leikam that she is still in the  quarantine process of covid for 14 days and it has not been 14 days yet and the symptoms she is having is related to covid and that she can take tylenol OTC for aches and chills and Mucinex to help with her URI sx but that is all she can do because an antibiotic is not going to help with sx. Ms. Hammad verbalized understanding.

## 2020-07-25 ENCOUNTER — Other Ambulatory Visit: Payer: Self-pay | Admitting: Nurse Practitioner

## 2020-08-01 ENCOUNTER — Other Ambulatory Visit: Payer: Self-pay | Admitting: Nurse Practitioner

## 2020-08-01 DIAGNOSIS — C50411 Malignant neoplasm of upper-outer quadrant of right female breast: Secondary | ICD-10-CM

## 2020-08-10 ENCOUNTER — Telehealth: Payer: Self-pay | Admitting: Hematology

## 2020-08-10 NOTE — Telephone Encounter (Signed)
R/s appt per 8/26 sch msg - left message for patient with apt date and time

## 2020-08-11 ENCOUNTER — Inpatient Hospital Stay: Payer: BC Managed Care – PPO

## 2020-08-11 ENCOUNTER — Telehealth: Payer: Self-pay | Admitting: Hematology

## 2020-08-11 ENCOUNTER — Ambulatory Visit (HOSPITAL_COMMUNITY): Payer: BC Managed Care – PPO

## 2020-08-11 NOTE — Telephone Encounter (Signed)
Rescheduled appt on 9/10 to 9/13 to see [name]. Unable to reach pt. Left voicemail with appt time and date.

## 2020-08-14 ENCOUNTER — Inpatient Hospital Stay: Payer: BC Managed Care – PPO | Admitting: Hematology

## 2020-08-24 ENCOUNTER — Ambulatory Visit (HOSPITAL_COMMUNITY): Admission: RE | Admit: 2020-08-24 | Payer: BC Managed Care – PPO | Source: Ambulatory Visit

## 2020-08-24 ENCOUNTER — Inpatient Hospital Stay: Payer: BC Managed Care – PPO | Attending: Hematology

## 2020-08-25 ENCOUNTER — Ambulatory Visit: Payer: BC Managed Care – PPO | Admitting: Hematology

## 2020-08-28 ENCOUNTER — Telehealth: Payer: Self-pay

## 2020-08-28 ENCOUNTER — Inpatient Hospital Stay: Payer: BC Managed Care – PPO | Admitting: Nurse Practitioner

## 2020-08-28 NOTE — Telephone Encounter (Signed)
Pt appt rescheduled was to have ct scan chest abdomen w/contrast prior to todays appt appt for scan scheduled 8/27 and 9/9 pt cancelled both times pt also had a bone scan scheduled in July which was also cancelled  Pt states she has issue with nursing care for daughter as at times agnecy calls off and she has no one to care for her daughter  CT is authorized dexa does not require prior auth. I will call to attempt to reschedule as pt states Tuesdays or Wednesdays before lunch is best

## 2020-09-12 NOTE — Telephone Encounter (Signed)
Can you please touch base with her this week to see if she is willing to r/s scans.   Thanks, Regan Rakers

## 2020-10-10 ENCOUNTER — Other Ambulatory Visit: Payer: Self-pay

## 2020-10-10 ENCOUNTER — Inpatient Hospital Stay: Payer: BC Managed Care – PPO | Attending: Hematology

## 2020-10-10 DIAGNOSIS — Z170 Estrogen receptor positive status [ER+]: Secondary | ICD-10-CM | POA: Insufficient documentation

## 2020-10-10 DIAGNOSIS — C50411 Malignant neoplasm of upper-outer quadrant of right female breast: Secondary | ICD-10-CM | POA: Diagnosis present

## 2020-10-10 LAB — CMP (CANCER CENTER ONLY)
ALT: 19 U/L (ref 0–44)
AST: 17 U/L (ref 15–41)
Albumin: 3.8 g/dL (ref 3.5–5.0)
Alkaline Phosphatase: 108 U/L (ref 38–126)
Anion gap: 8 (ref 5–15)
BUN: 8 mg/dL (ref 6–20)
CO2: 26 mmol/L (ref 22–32)
Calcium: 10.1 mg/dL (ref 8.9–10.3)
Chloride: 108 mmol/L (ref 98–111)
Creatinine: 0.77 mg/dL (ref 0.44–1.00)
GFR, Estimated: >60 mL/min (ref >60)
Glucose, Bld: 98 mg/dL (ref 70–99)
Potassium: 4 mmol/L (ref 3.5–5.1)
Sodium: 142 mmol/L (ref 135–145)
Total Bilirubin: 0.3 mg/dL (ref 0.3–1.2)
Total Protein: 7.1 g/dL (ref 6.5–8.1)

## 2020-10-10 LAB — CBC WITH DIFFERENTIAL (CANCER CENTER ONLY)
Abs Immature Granulocytes: 0.02 10*3/uL (ref 0.00–0.07)
Basophils Absolute: 0.1 10*3/uL (ref 0.0–0.1)
Basophils Relative: 1 %
Eosinophils Absolute: 0.1 10*3/uL (ref 0.0–0.5)
Eosinophils Relative: 2 %
HCT: 37.1 % (ref 36.0–46.0)
Hemoglobin: 11.8 g/dL — ABNORMAL LOW (ref 12.0–15.0)
Immature Granulocytes: 0 %
Lymphocytes Relative: 17 %
Lymphs Abs: 1 10*3/uL (ref 0.7–4.0)
MCH: 28.2 pg (ref 26.0–34.0)
MCHC: 31.8 g/dL (ref 30.0–36.0)
MCV: 88.8 fL (ref 80.0–100.0)
Monocytes Absolute: 0.5 10*3/uL (ref 0.1–1.0)
Monocytes Relative: 8 %
Neutro Abs: 4.2 10*3/uL (ref 1.7–7.7)
Neutrophils Relative %: 72 %
Platelet Count: 266 10*3/uL (ref 150–400)
RBC: 4.18 MIL/uL (ref 3.87–5.11)
RDW: 13 % (ref 11.5–15.5)
WBC Count: 5.8 10*3/uL (ref 4.0–10.5)
nRBC: 0 % (ref 0.0–0.2)

## 2020-10-11 LAB — CANCER ANTIGEN 27.29: CA 27.29: 30.6 U/mL (ref 0.0–38.6)

## 2020-10-16 ENCOUNTER — Telehealth: Payer: Self-pay

## 2020-10-16 NOTE — Telephone Encounter (Signed)
I spoke with [name] regarding rescheduling ov for tomorrow 11/2.  She has not had the CT scans done. She states she started a new job.  She has the number to call and schedule scans.  She states she will call tomorrow to scheduled.  Scheduling message sent for f/u with LB in 3-4 weeks.

## 2020-10-17 ENCOUNTER — Inpatient Hospital Stay: Payer: BC Managed Care – PPO | Admitting: Nurse Practitioner

## 2020-10-17 ENCOUNTER — Other Ambulatory Visit: Payer: Self-pay | Admitting: Hematology

## 2020-10-17 ENCOUNTER — Telehealth: Payer: Self-pay | Admitting: Nurse Practitioner

## 2020-10-17 NOTE — Telephone Encounter (Signed)
Scheduled appt per 11/1 sch msg - pt is aware of appt date and time  Per pt she will contact [facility] for ct scan

## 2020-10-27 ENCOUNTER — Other Ambulatory Visit: Payer: Self-pay | Admitting: Hematology

## 2020-11-02 ENCOUNTER — Ambulatory Visit (HOSPITAL_COMMUNITY): Payer: BC Managed Care – PPO

## 2020-11-06 NOTE — Progress Notes (Deleted)
[place]   [phone]) [phone] Fax:(336) [phone]   Clinic Follow up Note   Patient Care Team: Merwyn Katos as PCP - General (Physician Assistant) Jovita Kussmaul, MD as Consulting Physician (General Surgery) Truitt Merle, MD as Consulting Physician (Hematology) Gery Pray, MD as Consulting Physician (Radiation Oncology) Rockwell Germany, RN as Oncology Nurse Navigator Mauro Kaufmann, RN as Oncology Nurse Navigator Alla Feeling, NP as Nurse Practitioner (Nurse Practitioner) 11/06/2020  CHIEF COMPLAINT: Follow-up right breast cancer  SUMMARY OF ONCOLOGIC HISTORY: Oncology History Overview Note  Cancer Staging Malignant neoplasm of upper-outer quadrant of right breast in female, estrogen receptor positive ([place]) Staging form: Breast, AJCC 8th Edition - Clinical stage from 01/15/2019: Stage IA (cT1b, cN0, cM0, G1, ER+, PR-, HER2-) - Signed by Truitt Merle, MD on 01/26/2019 - Pathologic stage from 02/24/2019: Stage IB (pT1a, pN1a, cM0, G1, ER+, PR-, HER2-) - Signed by Truitt Merle, MD on 03/18/2019     Malignant neoplasm of upper-outer quadrant of right breast in female, estrogen receptor positive ([place])  01/15/2019 Cancer Staging   Staging form: Breast, AJCC 8th Edition - Clinical stage from 01/15/2019: Stage IA (cT1b, cN0, cM0, G1, ER+, PR-, HER2-) - Signed by Truitt Merle, MD on 01/26/2019   01/15/2019 Mammogram   Diagnostic Mammogram 01/15/19  IMPRESSION Targeted ultrasound is performed, showing right breast 10 o'clock 5 cm from the nipple hypoechoic round mass measuring 0.7 x 0.5 x 0.6 cm. Adjacent to it there is a probable complicated cyst measuring 4 mm. There is no evidence of right axillary lymphadenopathy.   01/15/2019 Initial Biopsy   Diagnosis 01/15/19  Breast, right, needle core biopsy, upper outer - INVASIVE AND IN SITU MAMMARY CARCINOMA WITH CALCIFICATIONS. - SEE COMMENT. 1 of 3 FINAL for Snyder, [name] ([id number]) Microscopic  Comment The carcinoma appears grade I. The malignant cells are positive for cytokeratin AE1/AE3 and negative for E-cadherin. A breast prognostic profile will be performed and the results reported separately. Dr Vicente Males has reviewed the case and concurs with this interpretation. The results are called to [facility] on 01/18/2019. (JBK:ah:ecj 01/18/19)   01/15/2019 Receptors her2   PROGNOSTIC INDICATORS Results: IMMUNOHISTOCHEMICAL AND MORPHOMETRIC ANALYSIS PERFORMED MANUALLY The tumor cells are NEGATIVE for Her2 (0). Estrogen Receptor: 10%, POSITIVE, MODERATE STAINING INTENSITY Progesterone Receptor: 0%, NEGATIVE Proliferation Marker Ki67: <1%   01/21/2019 Initial Diagnosis   Malignant neoplasm of upper-outer quadrant of right breast in female, estrogen receptor positive ([place])   01/29/2019 Breast MRI   IMPRESSION: 1. The biopsy-proven site of malignancy in the upper-outer quadrant of the right breast resides centrally with in an area of suspicious non mass enhancement spanning 6-7 cm.  2. There is a 6 mm suspicious enhancing mass in the lower-inner quadrant of the right breast, posterior depth.  3. There is a 9 mm indeterminate enhancing mass in the lower outer quadrant of the right breast.  4.  No evidence of malignancy in the left breast.   02/24/2019 Surgery   RIGHT MASTECTOMY WITH SENTINEL LYMPH NODE MAPPING AND LEFT PROPHYLACTIC MASTECTOMY by Dr. Marlou Starks  BREAST RECONSTRUCTION WITH PLACEMENT OF TISSUE EXPANDER by Dr. Marla Roe  02/24/19    02/24/2019 Pathology Results   Diagnosis 1. Breast, simple mastectomy, Left - BENIGN BREAST PARENCHYMA WITH FIBROCYSTIC CHANGE. - NEGATIVE FOR CARCINOMA. 2. Breast, simple mastectomy, Right - LOBULAR CARCINOMA IN SITU, INTERMEDIATE TO HIGH NUCLEAR GRADE. SEE NOTE. - NO EVIDENCE OF RESIDUAL INVASIVE CARCINOMA. - RESECTION MARGINS ARE NEGATIVE  FOR IN SITU OR INVASIVE CARCINOMA. - BIOPSY SITE CHANGES. - SEE ONCOLOGY  TABLE. 3. Lymph node, sentinel, biopsy, Right #1 - IMMUNOHISTOCHEMICAL STAIN FOR AE1/AE3 IS NEGATIVE FOR CARCINOMA (0/1). 4. Lymph node, sentinel, biopsy, Right - IMMUNOHISTOCHEMICAL STAIN FOR AE1/AE3 IS NEGATIVE FOR CARCINOMA (0/1). 5. Lymph node, sentinel, biopsy, Right #2 - IMMUNOHISTOCHEMICAL STAIN FOR AE1/AE3 IS NEGATIVE FOR CARCINOMA (0/1). 6. Lymph node, sentinel, biopsy, Right - IMMUNOHISTOCHEMICAL STAIN FOR AE1/AE3 IS NEGATIVE FOR CARCINOMA (0/1). 7. Lymph node, sentinel, biopsy, Right - METASTATIC CARCINOMA TO ONE LYMPH NODE (1/1) (CONFIRMED WITH IMMUNOHISTOCHEMICAL STAIN FOR AE1/AE3) - FOCUS OF METASTATIC CARCINOMA MEASURES JUST OVER 0.2 CM IN GREATEST DIMENSION - NO EVIDENCE OF EXTRANODAL EXTENSION   02/24/2019 Cancer Staging   Staging form: Breast, AJCC 8th Edition - Pathologic stage from 02/24/2019: Stage IB (pT1a, pN1a, cM0, G1, ER+, PR-, HER2-) - Signed by Truitt Merle, MD on 03/18/2019   04/01/2019 - 06/03/2019 Chemotherapy   adjuvant Taxol and Carboplatin every 3 weeks for 4 cycles starting in 2 weeks starting 04/01/19-06/03/19   06/2019 -  Anti-estrogen oral therapy   Anastrozole 53m daily starting 06/2019. Held after 2 weeks due to hot flashes and joint pain and RT.  Switched to Exemestane in 10/2019   06/30/2019 Surgery   REMOVAL OF BILATERAL TISSUE EXPANDERS WITH PLACEMENT OF BILATERAL BREAST IMPLANTS by Dr DMarla Roe 06/30/19    08/17/2019 - 09/28/2019 Radiation Therapy   Adjuvant Radiaiton with Dr KSondra Come Site Technique Total Dose (Gy) Dose per Fx (Gy) Completed Fx Beam Energies  Thorax: CW_Rt_Bst_boost Electron 10/10 2 5/5 6E  Thorax: CW_Rt 3D 50/50 2 25/25 10X, 15X, 6X  Thorax: CW_Rt_axilla Complex 50/50 2 25/25 10X     12/23/2019 Survivorship   SCP delivered by LCira Rue NP    06/02/2020 Imaging   MRI brain with and without contrast IMPRESSION: 1. No acute intracranial abnormality or evidence of metastatic disease. 2. Mild cerebral white matter T2 signal  changes, nonspecific though may reflect chronic small vessel ischemic disease, migraines, or prior infection/inflammation.   06/06/2020 Imaging   Bone scan IMPRESSION: No definite scintigraphic evidence of osseous metastases. Mildly abnormal uptake is noted in both knees and feet most consistent with degenerative change.     CURRENT THERAPY:  S/p bilateral mastectomy and adjuvant chemo TC q3 weeks x4 cycles in 2020, started Anastrozole 15mdaily starting 06/2019, held after 2 weeks due to hot flashes and joint pain and RT. Switched to Exemestane in 10/2019, ultimately changed to letrozole in 01/2020 due to worsening joint pain. Ultimately stopped anti-estrogen therapies in 03/2020 due to arthralgias   INTERVAL HISTORY: Ms. PeZweigeturns for f/u as scheduled. She was last seen 06/2020.    REVIEW OF SYSTEMS:   Constitutional: Denies fevers, chills or abnormal weight loss Eyes: Denies blurriness of vision Ears, nose, mouth, throat, and face: Denies mucositis or sore throat Respiratory: Denies cough, dyspnea or wheezes Cardiovascular: Denies palpitation, chest discomfort or lower extremity swelling Gastrointestinal:  Denies nausea, heartburn or change in bowel habits Skin: Denies abnormal skin rashes Lymphatics: Denies new lymphadenopathy or easy bruising Neurological:Denies numbness, tingling or new weaknesses Behavioral/Psych: Mood is stable, no new changes  All other systems were reviewed with the patient and are negative.  MEDICAL HISTORY:  Past Medical History:  Diagnosis Date  . Anemia   . Anxiety   . Arthritis   . [facility]   [facility]  . Family history of adverse reaction to anesthesia    sister has  same issues  . GERD (gastroesophageal reflux disease)   . Neuromuscular disorder ([facility])    neuropathy  . PONV (postoperative nausea and vomiting)     SURGICAL HISTORY: Past Surgical History:  Procedure Laterality Date  . BREAST RECONSTRUCTION WITH PLACEMENT OF  TISSUE EXPANDER AND FLEX HD (ACELLULAR HYDRATED DERMIS) Bilateral 02/24/2019   Procedure: BREAST RECONSTRUCTION WITH PLACEMENT OF TISSUE EXPANDER AND FLEX HD (ACELLULAR HYDRATED DERMIS);  Surgeon: Wallace Going, DO;  Location: [facility];  Service: Plastics;  Laterality: Bilateral;  . BREAST SURGERY     left breast bx  . ESOPHAGOGASTRODUODENOSCOPY (EGD) WITH ESOPHAGEAL DILATION    . EYE SURGERY     dart to the eye  (right)  . FACIAL FRACTURE SURGERY    . FRACTURE SURGERY    . KNEE SURGERY    . MASTECTOMY W/ SENTINEL NODE BIOPSY Bilateral 02/24/2019   Procedure: RIGHT MASTECTOMY WITH SENTINEL LYMPH NODE MAPPING AND LEFT PROPHYLACTIC MASTECTOMY;  Surgeon: Jovita Kussmaul, MD;  Location: [place];  Service: General;  Laterality: Bilateral;  . REMOVAL OF BILATERAL TISSUE EXPANDERS WITH PLACEMENT OF BILATERAL BREAST IMPLANTS Bilateral 06/30/2019   Procedure: REMOVAL OF BILATERAL TISSUE EXPANDERS WITH PLACEMENT OF BILATERAL BREAST IMPLANTS;  Surgeon: Wallace Going, DO;  Location: [place];  Service: Plastics;  Laterality: Bilateral;  . SHOULDER SURGERY      I have reviewed the social history and family history with the patient and they are unchanged from previous note.  ALLERGIES:  is allergic to codeine and tramadol.  MEDICATIONS:  Current Outpatient Medications  Medication Sig Dispense Refill  . Cholecalciferol (VITAMIN D3) 125 MCG (5000 UT) CAPS Take 5,000 Units by mouth daily.    Marland Kitchen ibuprofen (ADVIL,MOTRIN) 200 MG tablet Take 600 mg by mouth every 6 (six) hours as needed for moderate pain.     Marland Kitchen LORazepam (ATIVAN) 1 MG tablet Take 1 tablet (1 mg total) by mouth once as needed for up to 1 dose for anxiety (before MRI). Take 1 tab 30-60 mins before MRI, OK to take second one before MRI if needed 2 tablet 0  . Multiple Vitamin (MULTIVITAMIN WITH MINERALS) TABS tablet Take 1 tablet by mouth daily.    Marland Kitchen omeprazole (PRILOSEC) 20 MG capsule Take 20 mg by mouth daily.    .  potassium chloride SA (KLOR-CON M20) 20 MEQ tablet TAKE 1 TABLET ONCE DAILY ON MONDAYS WEDNESDAYS AND FRIDAYS 45 tablet 1  . venlafaxine XR (EFFEXOR-XR) 75 MG 24 hr capsule TAKE 1 CAPSULE (75 MG TOTAL) BY MOUTH DAILY WITH BREAKFAST. 30 capsule 0  . zolpidem (AMBIEN) 5 MG tablet Take 5 mg by mouth at bedtime as needed for sleep.     No current facility-administered medications for this visit.    PHYSICAL EXAMINATION: ECOG PERFORMANCE STATUS: {CHL ONC ECOG PS:[id number]}  There were no vitals filed for this visit. There were no vitals filed for this visit.  GENERAL:alert, no distress and comfortable SKIN: skin color, texture, turgor are normal, no rashes or significant lesions EYES: normal, Conjunctiva are pink and non-injected, sclera clear OROPHARYNX:no exudate, no erythema and lips, buccal mucosa, and tongue normal  NECK: supple, thyroid normal size, non-tender, without nodularity LYMPH:  no palpable lymphadenopathy in the cervical, axillary or inguinal LUNGS: clear to auscultation and percussion with normal breathing effort HEART: regular rate & rhythm and no murmurs and no lower extremity edema ABDOMEN:abdomen soft, non-tender and normal bowel sounds Musculoskeletal:no cyanosis of digits and no clubbing  NEURO: alert &  oriented x 3 with fluent speech, no focal motor/sensory deficits  LABORATORY DATA:  I have reviewed the data as listed CBC Latest Ref Rng & Units 10/10/2020 05/14/2020 05/04/2020  WBC 4.0 - 10.5 K/uL 5.8 6.1 6.2  Hemoglobin 12.0 - 15.0 g/dL 11.8(L) 12.8 12.0  Hematocrit 36 - 46 % 37.1 39.4 36.3  Platelets 150 - 400 K/uL 266 300 289     CMP Latest Ref Rng & Units 10/10/2020 05/14/2020 05/04/2020  Glucose 70 - 99 mg/dL 98 85 95  BUN 6 - 20 mg/dL '8 10 8  ' Creatinine 0.44 - 1.00 mg/dL 0.77 0.67 0.71  Sodium 135 - 145 mmol/L 142 141 140  Potassium 3.5 - 5.1 mmol/L 4.0 3.5 3.1(L)  Chloride 98 - 111 mmol/L 108 105 105  CO2 22 - 32 mmol/L '26 22 22  ' Calcium 8.9 - 10.3  mg/dL 10.1 9.8 9.5  Total Protein 6.5 - 8.1 g/dL 7.1 7.7 -  Total Bilirubin 0.3 - 1.2 mg/dL 0.3 0.5 -  Alkaline Phos 38 - 126 U/L 108 93 -  AST 15 - 41 U/L 17 23 -  ALT 0 - 44 U/L 19 17 -      RADIOGRAPHIC STUDIES: I have personally reviewed the radiological images as listed and agreed with the findings in the report. No results found.   ASSESSMENT & PLAN:  No problem-specific Assessment & Plan notes found for this encounter.   No orders of the defined types were placed in this encounter.  All questions were answered. The patient knows to call the clinic with any problems, questions or concerns. No barriers to learning was detected. I spent {CHL ONC TIME VISIT - SNGXE:[id number]} counseling the patient face to face. The total time spent in the appointment was {CHL ONC TIME VISIT - MLVXB:[id number]} and more than 50% was on counseling and review of test results     Alla Feeling, NP 11/06/20

## 2020-11-07 ENCOUNTER — Inpatient Hospital Stay: Payer: BC Managed Care – PPO | Admitting: Nurse Practitioner

## 2020-11-21 ENCOUNTER — Other Ambulatory Visit: Payer: Self-pay | Admitting: Hematology

## 2020-11-23 ENCOUNTER — Telehealth: Payer: Self-pay | Admitting: Hematology

## 2020-11-23 NOTE — Telephone Encounter (Signed)
Scheduled apt per 12/8 sch msg - pt is aware of appt date and time

## 2020-12-14 ENCOUNTER — Other Ambulatory Visit: Payer: Self-pay | Admitting: Hematology

## 2021-01-03 NOTE — Progress Notes (Signed)
[place]   [phone] [phone]) [phone]   Clinic Follow up Note   Patient Care Team: Merwyn Katos as PCP - General (Physician Assistant) Jovita Kussmaul, MD as Consulting Physician (General Surgery) Truitt Merle, MD as Consulting Physician (Hematology) Gery Pray, MD as Consulting Physician (Radiation Oncology) Rockwell Germany, RN as Oncology Nurse Navigator Mauro Kaufmann, RN as Oncology Nurse Navigator Alla Feeling, NP as Nurse Practitioner (Nurse Practitioner)  Date of Service:  01/04/2021  CHIEF COMPLAINT: F/u of right breast cancer  SUMMARY OF ONCOLOGIC HISTORY: Oncology History Overview Note  Cancer Staging Malignant neoplasm of upper-outer quadrant of right breast in female, estrogen receptor positive ([facility]) Staging form: Breast, AJCC 8th Edition - Clinical stage from 01/15/2019: Stage IA (cT1b, cN0, cM0, G1, ER+, PR-, HER2-) - Signed by Truitt Merle, MD on 01/26/2019 - Pathologic stage from 02/24/2019: Stage IB (pT1a, pN1a, cM0, G1, ER+, PR-, HER2-) - Signed by Truitt Merle, MD on 03/18/2019     Malignant neoplasm of upper-outer quadrant of right breast in female, estrogen receptor positive ([place])  01/15/2019 Cancer Staging   Staging form: Breast, AJCC 8th Edition - Clinical stage from 01/15/2019: Stage IA (cT1b, cN0, cM0, G1, ER+, PR-, HER2-) - Signed by Truitt Merle, MD on 01/26/2019   01/15/2019 Mammogram   Diagnostic Mammogram 01/15/19  IMPRESSION Targeted ultrasound is performed, showing right breast 10 o'clock 5 cm from the nipple hypoechoic round mass measuring 0.7 x 0.5 x 0.6 cm. Adjacent to it there is a probable complicated cyst measuring 4 mm. There is no evidence of right axillary lymphadenopathy.   01/15/2019 Initial Biopsy   Diagnosis 01/15/19  Breast, right, needle core biopsy, upper outer - INVASIVE AND IN SITU MAMMARY CARCINOMA WITH CALCIFICATIONS. - SEE COMMENT. 1 of 3 FINAL for Snyder, [name]  ([id number]) Microscopic Comment The carcinoma appears grade I. The malignant cells are positive for cytokeratin AE1/AE3 and negative for E-cadherin. A breast prognostic profile will be performed and the results reported separately. Dr Vicente Males has reviewed the case and concurs with this interpretation. The results are called to [facility] on 01/18/2019. (JBK:ah:ecj 01/18/19)   01/15/2019 Receptors her2   PROGNOSTIC INDICATORS Results: IMMUNOHISTOCHEMICAL AND MORPHOMETRIC ANALYSIS PERFORMED MANUALLY The tumor cells are NEGATIVE for Her2 (0). Estrogen Receptor: 10%, POSITIVE, MODERATE STAINING INTENSITY Progesterone Receptor: 0%, NEGATIVE Proliferation Marker Ki67: <1%   01/21/2019 Initial Diagnosis   Malignant neoplasm of upper-outer quadrant of right breast in female, estrogen receptor positive ([place])   01/29/2019 Breast MRI   IMPRESSION: 1. The biopsy-proven site of malignancy in the upper-outer quadrant of the right breast resides centrally with in an area of suspicious non mass enhancement spanning 6-7 cm.  2. There is a 6 mm suspicious enhancing mass in the lower-inner quadrant of the right breast, posterior depth.  3. There is a 9 mm indeterminate enhancing mass in the lower outer quadrant of the right breast.  4.  No evidence of malignancy in the left breast.   02/24/2019 Surgery   RIGHT MASTECTOMY WITH SENTINEL LYMPH NODE MAPPING AND LEFT PROPHYLACTIC MASTECTOMY by Dr. Marlou Starks  BREAST RECONSTRUCTION WITH PLACEMENT OF TISSUE EXPANDER by Dr. Marla Roe  02/24/19    02/24/2019 Pathology Results   Diagnosis 1. Breast, simple mastectomy, Left - BENIGN BREAST PARENCHYMA WITH FIBROCYSTIC CHANGE. - NEGATIVE FOR CARCINOMA. 2. Breast, simple mastectomy, Right - LOBULAR CARCINOMA IN SITU, INTERMEDIATE TO HIGH NUCLEAR GRADE. SEE NOTE. - NO EVIDENCE OF RESIDUAL INVASIVE  CARCINOMA. - RESECTION MARGINS ARE NEGATIVE FOR IN SITU OR INVASIVE CARCINOMA. - BIOPSY SITE  CHANGES. - SEE ONCOLOGY TABLE. 3. Lymph node, sentinel, biopsy, Right #1 - IMMUNOHISTOCHEMICAL STAIN FOR AE1/AE3 IS NEGATIVE FOR CARCINOMA (0/1). 4. Lymph node, sentinel, biopsy, Right - IMMUNOHISTOCHEMICAL STAIN FOR AE1/AE3 IS NEGATIVE FOR CARCINOMA (0/1). 5. Lymph node, sentinel, biopsy, Right #2 - IMMUNOHISTOCHEMICAL STAIN FOR AE1/AE3 IS NEGATIVE FOR CARCINOMA (0/1). 6. Lymph node, sentinel, biopsy, Right - IMMUNOHISTOCHEMICAL STAIN FOR AE1/AE3 IS NEGATIVE FOR CARCINOMA (0/1). 7. Lymph node, sentinel, biopsy, Right - METASTATIC CARCINOMA TO ONE LYMPH NODE (1/1) (CONFIRMED WITH IMMUNOHISTOCHEMICAL STAIN FOR AE1/AE3) - FOCUS OF METASTATIC CARCINOMA MEASURES JUST OVER 0.2 CM IN GREATEST DIMENSION - NO EVIDENCE OF EXTRANODAL EXTENSION   02/24/2019 Cancer Staging   Staging form: Breast, AJCC 8th Edition - Pathologic stage from 02/24/2019: Stage IB (pT1a, pN1a, cM0, G1, ER+, PR-, HER2-) - Signed by Truitt Merle, MD on 03/18/2019   04/01/2019 - 06/03/2019 Chemotherapy   adjuvant Taxol and Carboplatin every 3 weeks for 4 cycles starting in 2 weeks starting 04/01/19-06/03/19   06/2019 - 03/2020 Anti-estrogen oral therapy   Anastrozole $RemoveBefo'1mg'XbpoPZhnQzC$  daily starting 06/2019, held after 2 weeks due to hot flashes and joint pain and RT. Switched to Exemestane in 10/2019. Switched to Exemestane in 10/2019, ultimately changed to letrozole in 01/2020 due to worsening joint pain. Ultimately stopped anti-estrogen therapies in 03/2020 due to arthralgias    06/30/2019 Surgery   REMOVAL OF BILATERAL TISSUE EXPANDERS WITH PLACEMENT OF BILATERAL BREAST IMPLANTS by Dr Marla Roe  06/30/19    08/17/2019 - 09/28/2019 Radiation Therapy   Adjuvant Radiaiton with Dr Sondra Come  Site Technique Total Dose (Gy) Dose per Fx (Gy) Completed Fx Beam Energies  Thorax: CW_Rt_Bst_boost Electron 10/10 2 5/5 6E  Thorax: CW_Rt 3D 50/50 2 25/25 10X, 15X, 6X  Thorax: CW_Rt_axilla Complex 50/50 2 25/25 10X     12/23/2019 Survivorship   SCP delivered by  Cira Rue, NP    06/02/2020 Imaging   MRI brain with and without contrast IMPRESSION: 1. No acute intracranial abnormality or evidence of metastatic disease. 2. Mild cerebral white matter T2 signal changes, nonspecific though may reflect chronic small vessel ischemic disease, migraines, or prior infection/inflammation.   06/06/2020 Imaging   Bone scan IMPRESSION: No definite scintigraphic evidence of osseous metastases. Mildly abnormal uptake is noted in both knees and feet most consistent with degenerative change.      CURRENT THERAPY:  Surveillance   INTERVAL HISTORY:  [name] is here for a follow up of right breast cancer. She was last seen by me in 09/2019 an seen by NP Lacie in interim, last 6 months ago. She presents to the clinic alone.  During her last visits, she had significant right upper quadrant abdominal pain, Lacie ordered an CT abdomen pelvis with contrast, but she was not able to tolerate contrast and scan was not not completed.  Her abdominal pain has much improved, although she still has intermittent discomfort in the right upper quadrant, especially after meals. It's tolerable, no fever, chills, or nausea.  Her appetite is chronically low, weight is stable.  She has been under a lot of stress to take care of her daughter who has been on mechanical ventilator for over 20 years.  She denies any other new pain, does feel gassy sometimes, no change of her bowel habits.    All other systems were reviewed with the patient and are negative.  MEDICAL HISTORY:  Past Medical History:  Diagnosis Date  . Anemia   . Anxiety   . Arthritis   . Cancer [facility])    right breast  . Family history of adverse reaction to anesthesia    sister has same issues  . GERD (gastroesophageal reflux disease)   . Neuromuscular disorder (HCC)    neuropathy  . PONV (postoperative nausea and vomiting)     SURGICAL HISTORY: Past Surgical History:  Procedure Laterality Date  .  BREAST RECONSTRUCTION WITH PLACEMENT OF TISSUE EXPANDER AND FLEX HD (ACELLULAR HYDRATED DERMIS) Bilateral 02/24/2019   Procedure: BREAST RECONSTRUCTION WITH PLACEMENT OF TISSUE EXPANDER AND FLEX HD (ACELLULAR HYDRATED DERMIS);  Surgeon: Wallace Going, DO;  Location: [place];  Service: Plastics;  Laterality: Bilateral;  . BREAST SURGERY     left breast bx  . ESOPHAGOGASTRODUODENOSCOPY (EGD) WITH ESOPHAGEAL DILATION    . EYE SURGERY     dart to the eye  (right)  . FACIAL FRACTURE SURGERY    . FRACTURE SURGERY    . KNEE SURGERY    . MASTECTOMY W/ SENTINEL NODE BIOPSY Bilateral 02/24/2019   Procedure: RIGHT MASTECTOMY WITH SENTINEL LYMPH NODE MAPPING AND LEFT PROPHYLACTIC MASTECTOMY;  Surgeon: Jovita Kussmaul, MD;  Location: [place];  Service: General;  Laterality: Bilateral;  . REMOVAL OF BILATERAL TISSUE EXPANDERS WITH PLACEMENT OF BILATERAL BREAST IMPLANTS Bilateral 06/30/2019   Procedure: REMOVAL OF BILATERAL TISSUE EXPANDERS WITH PLACEMENT OF BILATERAL BREAST IMPLANTS;  Surgeon: Wallace Going, DO;  Location: [place];  Service: Plastics;  Laterality: Bilateral;  . SHOULDER SURGERY      I have reviewed the social history and family history with the patient and they are unchanged from previous note.  ALLERGIES:  is allergic to codeine and tramadol.  MEDICATIONS:  Current Outpatient Medications  Medication Sig Dispense Refill  . Cholecalciferol (VITAMIN D3) 125 MCG (5000 UT) CAPS Take 5,000 Units by mouth daily.    Marland Kitchen ibuprofen (ADVIL,MOTRIN) 200 MG tablet Take 600 mg by mouth every 6 (six) hours as needed for moderate pain.     Marland Kitchen LORazepam (ATIVAN) 1 MG tablet Take 1 tablet (1 mg total) by mouth once as needed for up to 1 dose for anxiety (before MRI). Take 1 tab 30-60 mins before MRI, OK to take second one before MRI if needed 2 tablet 0  . Multiple Vitamin (MULTIVITAMIN WITH MINERALS) TABS tablet Take 1 tablet by mouth daily.    Marland Kitchen omeprazole (PRILOSEC) 20 MG capsule  Take 20 mg by mouth daily.    . potassium chloride SA (KLOR-CON M20) 20 MEQ tablet TAKE 1 TABLET ONCE DAILY ON MONDAYS WEDNESDAYS AND FRIDAYS 45 tablet 1  . venlafaxine XR (EFFEXOR-XR) 75 MG 24 hr capsule TAKE 1 CAPSULE BY MOUTH DAILY WITH BREAKFAST. 90 capsule 1  . zolpidem (AMBIEN) 5 MG tablet Take 5 mg by mouth at bedtime as needed for sleep.     No current facility-administered medications for this visit.    PHYSICAL EXAMINATION: ECOG PERFORMANCE STATUS: 1 - Symptomatic but completely ambulatory  Vitals:   01/04/21 1104  BP: (!) 135/93  Pulse: 91  Resp: 18  Temp: 98.2 F (36.8 C)  SpO2: 99%   Filed Weights   01/04/21 1104  Weight: 179 lb 3.2 oz (81.3 kg)    GENERAL:alert, no distress and comfortable SKIN: skin color, texture, turgor are normal, no rashes or significant lesions EYES: normal, Conjunctiva are pink and non-injected, sclera clear NECK: supple, thyroid normal size, non-tender, without nodularity LYMPH:  no palpable lymphadenopathy in the cervical, axillary  LUNGS: clear to auscultation and percussion with normal breathing effort HEART: regular rate & rhythm and no murmurs and no lower extremity edema ABDOMEN:abdomen soft, non-tender, no oranmegaly and bowel sounds is slightly low  Musculoskeletal:no cyanosis of digits and no clubbing  NEURO: alert & oriented x 3 with fluent speech, no focal motor/sensory deficits Breasts: Breast inspection showed status post bilateral mastectomy and implant placement, no palpable nodules (except the scar tissues along the incision, more on the left side), or adenopathy.   LABORATORY DATA:  I have reviewed the data as listed CBC Latest Ref Rng & Units 01/04/2021 10/10/2020 05/14/2020  WBC 4.0 - 10.5 K/uL 4.4 5.8 6.1  Hemoglobin 12.0 - 15.0 g/dL 12.1 11.8(L) 12.8  Hematocrit 36.0 - 46.0 % 37.9 37.1 39.4  Platelets 150 - 400 K/uL 302 266 300     CMP Latest Ref Rng & Units 01/04/2021 10/10/2020 05/14/2020  Glucose 70 - 99 mg/dL  73 98 85  BUN 6 - 20 mg/dL _0 Creatinine 0.44 - 1.00 mg/dL 0.74 0.77 0.67  Sodium 135 - 145 mmol/L 142 142 141  Potassium 3.5 - 5.1 mmol/L 3.5 4.0 3.5  Chloride 98 - 111 mmol/L 109 108 105  CO2 22 - 32 mmol/L _1 Calcium 8.9 - 10.3 mg/dL 9.6 10.1 9.8  Total Protein 6.5 - 8.1 g/dL 7.2 7.1 7.7  Total Bilirubin 0.3 - 1.2 mg/dL 0.4 0.3 0.5  Alkaline Phos 38 - 126 U/L 101 108 93  AST 15 - 41 U/L _2 ALT 0 - 44 U/L _3 RADIOGRAPHIC STUDIES: I have personally reviewed the radiological images as listed and agreed with the findings in the report. No results found.   ASSESSMENT & PLAN:  [name] is a 55 y.o. female with    1.Malignant neoplasm of upper-outer quadrant of right breast,invasive lobular carcinoma, stageIA,p(T1a,N1a,M0), ER10%+, PR-, HER2-Grade I -She was diagnosed in 12/2018. She is s/p b/l mastectomyon3/11/20 with reconstruction. She complete adjuvant chemo, RT and tried multiple AIs from 06/2019 (Anastroszole, Exemestane, Letrozole) but ultimately stopped in 03/2020 due to arthralgia.  Due to her weak ER, the benefit of antiestrogen therapy is also limited.  -Although she family history, she did not proceed with genetic testing.  -she is clinically stable, she does not have right upper quadrant abdominal pain in the past 6 months, but it has improved spontaneously.  She was not able to tolerate oral contrast for CT scan, I will obtain a abdominal ultrasound next week -Lab and exam was unremarkable, no other clinical concern for recurrence -Continue breast cancer surveillance -lab and f/u in 6 months   2. Abdominal pain -started in 06/2020, much better lately  -will get a abdominal US next week to evaluate liver and gall bladder   3. Headaches  -Onset 04/2020, no prior h/o headaches. 2021 Brain MRI negative for malignancy.  -takes tylenol/NSAID, will monitor.  -overall better     PLAN: -abdominal US next week, will call her  with results  -lab and f/u in 6 months, she will see her GYN in 2-3 months     No problem-specific Assessment & Plan notes found for this encounter.   Orders Placed This Encounter  Procedures  . US Abdomen Complete    Standing Status:   Future    Standing Expiration Date:   01/04/2022    Order Specific Question:  Reason for Exam (SYMPTOM  OR DIAGNOSIS REQUIRED)    Answer:   RUQ pain, evaluate gall bladder and liver, history of breast cancer    Order Specific Question:   Preferred imaging location?    Answer:   [facility]    Order Specific Question:   Release to patient    Answer:   Immediate   All questions were answered. The patient knows to call the clinic with any problems, questions or concerns. No barriers to learning was detected. The total time spent in the appointment was 30 minutes.     Truitt Merle, MD 01/04/2021   I, Joslyn Devon, am acting as scribe for Truitt Merle, MD.   I have reviewed the above documentation for accuracy and completeness, and I agree with the above.

## 2021-01-04 ENCOUNTER — Encounter: Payer: Self-pay | Admitting: Hematology

## 2021-01-04 ENCOUNTER — Other Ambulatory Visit: Payer: Self-pay

## 2021-01-04 ENCOUNTER — Inpatient Hospital Stay: Payer: BC Managed Care – PPO | Attending: Hematology | Admitting: Hematology

## 2021-01-04 ENCOUNTER — Inpatient Hospital Stay: Payer: BC Managed Care – PPO

## 2021-01-04 VITALS — BP 135/93 | HR 91 | Temp 98.2°F | Resp 18 | Ht 63.0 in | Wt 179.2 lb

## 2021-01-04 DIAGNOSIS — K219 Gastro-esophageal reflux disease without esophagitis: Secondary | ICD-10-CM | POA: Insufficient documentation

## 2021-01-04 DIAGNOSIS — R1011 Right upper quadrant pain: Secondary | ICD-10-CM | POA: Diagnosis not present

## 2021-01-04 DIAGNOSIS — C50411 Malignant neoplasm of upper-outer quadrant of right female breast: Secondary | ICD-10-CM | POA: Diagnosis present

## 2021-01-04 DIAGNOSIS — R531 Weakness: Secondary | ICD-10-CM | POA: Diagnosis not present

## 2021-01-04 DIAGNOSIS — G629 Polyneuropathy, unspecified: Secondary | ICD-10-CM | POA: Diagnosis not present

## 2021-01-04 DIAGNOSIS — R519 Headache, unspecified: Secondary | ICD-10-CM | POA: Diagnosis not present

## 2021-01-04 DIAGNOSIS — Z79899 Other long term (current) drug therapy: Secondary | ICD-10-CM | POA: Insufficient documentation

## 2021-01-04 DIAGNOSIS — R109 Unspecified abdominal pain: Secondary | ICD-10-CM | POA: Insufficient documentation

## 2021-01-04 DIAGNOSIS — Z170 Estrogen receptor positive status [ER+]: Secondary | ICD-10-CM | POA: Insufficient documentation

## 2021-01-04 LAB — CMP (CANCER CENTER ONLY)
ALT: 14 U/L (ref 0–44)
AST: 17 U/L (ref 15–41)
Albumin: 3.7 g/dL (ref 3.5–5.0)
Alkaline Phosphatase: 101 U/L (ref 38–126)
Anion gap: 8 (ref 5–15)
BUN: 10 mg/dL (ref 6–20)
CO2: 25 mmol/L (ref 22–32)
Calcium: 9.6 mg/dL (ref 8.9–10.3)
Chloride: 109 mmol/L (ref 98–111)
Creatinine: 0.74 mg/dL (ref 0.44–1.00)
GFR, Estimated: >60 mL/min (ref >60)
Glucose, Bld: 73 mg/dL (ref 70–99)
Potassium: 3.5 mmol/L (ref 3.5–5.1)
Sodium: 142 mmol/L (ref 135–145)
Total Bilirubin: 0.4 mg/dL (ref 0.3–1.2)
Total Protein: 7.2 g/dL (ref 6.5–8.1)

## 2021-01-04 LAB — CBC WITH DIFFERENTIAL (CANCER CENTER ONLY)
Abs Immature Granulocytes: 0.01 10*3/uL (ref 0.00–0.07)
Basophils Absolute: 0.1 10*3/uL (ref 0.0–0.1)
Basophils Relative: 2 %
Eosinophils Absolute: 0.2 10*3/uL (ref 0.0–0.5)
Eosinophils Relative: 4 %
HCT: 37.9 % (ref 36.0–46.0)
Hemoglobin: 12.1 g/dL (ref 12.0–15.0)
Immature Granulocytes: 0 %
Lymphocytes Relative: 25 %
Lymphs Abs: 1.1 10*3/uL (ref 0.7–4.0)
MCH: 28.3 pg (ref 26.0–34.0)
MCHC: 31.9 g/dL (ref 30.0–36.0)
MCV: 88.8 fL (ref 80.0–100.0)
Monocytes Absolute: 0.5 10*3/uL (ref 0.1–1.0)
Monocytes Relative: 12 %
Neutro Abs: 2.5 10*3/uL (ref 1.7–7.7)
Neutrophils Relative %: 57 %
Platelet Count: 302 10*3/uL (ref 150–400)
RBC: 4.27 MIL/uL (ref 3.87–5.11)
RDW: 12.6 % (ref 11.5–15.5)
WBC Count: 4.4 10*3/uL (ref 4.0–10.5)
nRBC: 0 % (ref 0.0–0.2)

## 2021-01-05 LAB — CANCER ANTIGEN 27.29: CA 27.29: 25.9 U/mL (ref 0.0–38.6)

## 2021-01-08 ENCOUNTER — Telehealth: Payer: Self-pay | Admitting: Nurse Practitioner

## 2021-01-08 NOTE — Telephone Encounter (Signed)
Scheduled appts per 1/20 los. Pt confirmed appt date and time.  

## 2021-01-15 ENCOUNTER — Ambulatory Visit (HOSPITAL_COMMUNITY): Payer: BC Managed Care – PPO

## 2021-02-14 ENCOUNTER — Telehealth: Payer: Self-pay

## 2021-02-14 ENCOUNTER — Telehealth: Payer: Self-pay | Admitting: Nurse Practitioner

## 2021-02-14 NOTE — Telephone Encounter (Signed)
Returned call regarding scheduling an appointment. Patient stated that she had fallen while skating and landed on her chest. Since then, patient has found it hard to take deep breaths and it hurts when she hiccups. Patient also stated that her right arm is going numb. Patient is worried because she has cancer on her right side and is worried about her implant. Moved upcoming appointment due to provider's template. Patient is aware of changes. Sent appointment request to nurse.

## 2021-02-14 NOTE — Telephone Encounter (Signed)
I called [name] regarding phone call with [name].  I instructed her to go to her PCP or urgent care as we do not treat injuries.

## 2021-03-05 ENCOUNTER — Other Ambulatory Visit: Payer: Self-pay

## 2021-03-05 ENCOUNTER — Ambulatory Visit (HOSPITAL_COMMUNITY)
Admission: RE | Admit: 2021-03-05 | Discharge: 2021-03-05 | Disposition: A | Discharge status: Home / Self Care | Payer: BC Managed Care – PPO | Source: Ambulatory Visit | Attending: Hematology | Admitting: Hematology

## 2021-03-05 DIAGNOSIS — Z170 Estrogen receptor positive status [ER+]: Secondary | ICD-10-CM | POA: Diagnosis present

## 2021-03-05 DIAGNOSIS — C50411 Malignant neoplasm of upper-outer quadrant of right female breast: Secondary | ICD-10-CM | POA: Diagnosis present

## 2021-03-07 ENCOUNTER — Other Ambulatory Visit: Payer: Self-pay | Admitting: Hematology

## 2021-03-07 ENCOUNTER — Telehealth: Payer: Self-pay | Admitting: Hematology

## 2021-03-07 DIAGNOSIS — Z170 Estrogen receptor positive status [ER+]: Secondary | ICD-10-CM

## 2021-03-07 DIAGNOSIS — C50411 Malignant neoplasm of upper-outer quadrant of right female breast: Secondary | ICD-10-CM

## 2021-03-07 NOTE — Telephone Encounter (Signed)
I called pt and reviewed her CT chest scan findings.  Scan showed slowly enlarging left internal mammary lymph nodes, less than 1 cm.  I think they are difficult to biopsy, patient is clinically asymptomatic, her previous breast cancer was on the right side, I recommend follow-up CT scan in 3 months. I will see her after scan.  Due to her intermittent abdominal pain, I ordered abdominal ultrasound January 2022, she has not scheduled.  We will do CT abdomen pelvis without contrast (she could not tolerate last time) in 3 months also.  Patient does to call me if she develops any new symptoms.  She voiced good understanding, agreed with the plan and appreciated the call. CT scan and schedule message sent.   [name]  03/07/2021

## 2021-03-08 ENCOUNTER — Telehealth: Payer: Self-pay | Admitting: Hematology

## 2021-03-08 NOTE — Telephone Encounter (Signed)
Scheduled appts per 3/23 sch msg. Pt aware.

## 2021-03-11 ENCOUNTER — Encounter: Payer: Self-pay | Admitting: Nurse Practitioner

## 2021-03-15 ENCOUNTER — Other Ambulatory Visit: Payer: Self-pay | Admitting: Hematology

## 2021-03-15 DIAGNOSIS — R590 Localized enlarged lymph nodes: Secondary | ICD-10-CM

## 2021-03-15 MED ORDER — ZOLPIDEM TARTRATE 5 MG PO TABS: 5.0000 mg | ORAL_TABLET | Freq: Every evening | ORAL | 0 refills | Status: DC | PRN

## 2021-03-19 ENCOUNTER — Telehealth: Payer: Self-pay

## 2021-03-19 NOTE — Telephone Encounter (Signed)
[name] left vm stating she wanted to get another CT scan for f/u.  Per Dr Burr Medico too soon for Ct.  She will order PET scan.  We are awaiting authorization.  I left vm for Tiffany Frasier to r/t my call

## 2021-03-27 ENCOUNTER — Telehealth: Payer: Self-pay | Admitting: Hematology

## 2021-03-27 NOTE — Telephone Encounter (Signed)
Scheduled appt per 4/12 sch msg. Pt aware.  

## 2021-03-29 ENCOUNTER — Other Ambulatory Visit: Payer: Self-pay

## 2021-03-29 ENCOUNTER — Encounter (HOSPITAL_COMMUNITY)
Admission: RE | Admit: 2021-03-29 | Discharge: 2021-03-29 | Disposition: A | Discharge status: Home / Self Care | Payer: BC Managed Care – PPO | Source: Ambulatory Visit | Attending: Hematology | Admitting: Hematology

## 2021-03-29 DIAGNOSIS — R590 Localized enlarged lymph nodes: Secondary | ICD-10-CM | POA: Diagnosis not present

## 2021-03-29 LAB — GLUCOSE, CAPILLARY: Glucose-Capillary: 110 mg/dL — ABNORMAL HIGH (ref 70–99)

## 2021-03-29 MED ORDER — FLUDEOXYGLUCOSE F - 18 (FDG) INJECTION
9.3000 | Freq: Once | INTRAVENOUS | Status: AC | PRN
  Administered 2021-03-29: 8.88 via INTRAVENOUS

## 2021-04-02 NOTE — Progress Notes (Signed)
[facility]   [phone] [phone]   Clinic Follow up Note   Patient Care Team: Merwyn Katos as PCP - General (Physician Assistant) Jovita Kussmaul, MD as Consulting Physician (General Surgery) Truitt Merle, MD as Consulting Physician (Hematology) Gery Pray, MD as Consulting Physician (Radiation Oncology) Rockwell Germany, RN as Oncology Nurse Navigator Mauro Kaufmann, RN as Oncology Nurse Navigator Alla Feeling, NP as Nurse Practitioner (Nurse Practitioner)  Date of Service:  04/02/2021   I connected with Ms Tusing on 04/03/21 at  9:00 AM EDT by telephone and verified that I am speaking with the correct person using two identifiers.   I discussed the limitations, risks, security and privacy concerns of performing an evaluation and management service by telephone and the availability of in person appointments. I also discussed with the patient that there may be a patient responsible charge related to this service. The patient expressed understanding and agreed to proceed.   Patient's location:  Home with her husband  Provider's location:  My office   CHIEF COMPLAINT:   SUMMARY OF ONCOLOGIC HISTORY: Oncology History Overview Note  Cancer Staging Malignant neoplasm of upper-outer quadrant of right breast in female, estrogen receptor positive ([place]) Staging form: Breast, AJCC 8th Edition - Clinical stage from 01/15/2019: Stage IA (cT1b, cN0, cM0, G1, ER+, PR-, HER2-) - Signed by Truitt Merle, MD on 01/26/2019 - Pathologic stage from 02/24/2019: Stage IB (pT1a, pN1a, cM0, G1, ER+, PR-, HER2-) - Signed by Truitt Merle, MD on 03/18/2019     Malignant neoplasm of upper-outer quadrant of right breast in female, estrogen receptor positive ([place])  01/15/2019 Cancer Staging   Staging form: Breast, AJCC 8th Edition - Clinical stage from 01/15/2019: Stage IA (cT1b, cN0, cM0, G1, ER+, PR-, HER2-) - Signed by Truitt Merle, MD on 01/26/2019   01/15/2019  Mammogram   Diagnostic Mammogram 01/15/19  IMPRESSION Targeted ultrasound is performed, showing right breast 10 o'clock 5 cm from the nipple hypoechoic round mass measuring 0.7 x 0.5 x 0.6 cm. Adjacent to it there is a probable complicated cyst measuring 4 mm. There is no evidence of right axillary lymphadenopathy.   01/15/2019 Initial Biopsy   Diagnosis 01/15/19  Breast, right, needle core biopsy, upper outer - INVASIVE AND IN SITU MAMMARY CARCINOMA WITH CALCIFICATIONS. - SEE COMMENT. 1 of 3 FINAL for Stearns, Charlise P ([id number]) Microscopic Comment The carcinoma appears grade I. The malignant cells are positive for cytokeratin AE1/AE3 and negative for E-cadherin. A breast prognostic profile will be performed and the results reported separately. Dr Vicente Males has reviewed the case and concurs with this interpretation. The results are called to [facility] on 01/18/2019. (JBK:ah:ecj 01/18/19)   01/15/2019 Receptors her2   PROGNOSTIC INDICATORS Results: IMMUNOHISTOCHEMICAL AND MORPHOMETRIC ANALYSIS PERFORMED MANUALLY The tumor cells are NEGATIVE for Her2 (0). Estrogen Receptor: 10%, POSITIVE, MODERATE STAINING INTENSITY Progesterone Receptor: 0%, NEGATIVE Proliferation Marker Ki67: <1%   01/21/2019 Initial Diagnosis   Malignant neoplasm of upper-outer quadrant of right breast in female, estrogen receptor positive ([place])   01/29/2019 Breast MRI   IMPRESSION: 1. The biopsy-proven site of malignancy in the upper-outer quadrant of the right breast resides centrally with in an area of suspicious non mass enhancement spanning 6-7 cm.  2. There is a 6 mm suspicious enhancing mass in the lower-inner quadrant of the right breast, posterior depth.  3. There is a 9 mm indeterminate enhancing mass in the lower outer quadrant of the  right breast.  4.  No evidence of malignancy in the left breast.   02/24/2019 Surgery   RIGHT MASTECTOMY WITH SENTINEL LYMPH NODE MAPPING  AND LEFT PROPHYLACTIC MASTECTOMY by Dr. Marlou Starks  BREAST RECONSTRUCTION WITH PLACEMENT OF TISSUE EXPANDER by Dr. Marla Roe  02/24/19    02/24/2019 Pathology Results   Diagnosis 1. Breast, simple mastectomy, Left - BENIGN BREAST PARENCHYMA WITH FIBROCYSTIC CHANGE. - NEGATIVE FOR CARCINOMA. 2. Breast, simple mastectomy, Right - LOBULAR CARCINOMA IN SITU, INTERMEDIATE TO HIGH NUCLEAR GRADE. SEE NOTE. - NO EVIDENCE OF RESIDUAL INVASIVE CARCINOMA. - RESECTION MARGINS ARE NEGATIVE FOR IN SITU OR INVASIVE CARCINOMA. - BIOPSY SITE CHANGES. - SEE ONCOLOGY TABLE. 3. Lymph node, sentinel, biopsy, Right #1 - IMMUNOHISTOCHEMICAL STAIN FOR AE1/AE3 IS NEGATIVE FOR CARCINOMA (0/1). 4. Lymph node, sentinel, biopsy, Right - IMMUNOHISTOCHEMICAL STAIN FOR AE1/AE3 IS NEGATIVE FOR CARCINOMA (0/1). 5. Lymph node, sentinel, biopsy, Right #2 - IMMUNOHISTOCHEMICAL STAIN FOR AE1/AE3 IS NEGATIVE FOR CARCINOMA (0/1). 6. Lymph node, sentinel, biopsy, Right - IMMUNOHISTOCHEMICAL STAIN FOR AE1/AE3 IS NEGATIVE FOR CARCINOMA (0/1). 7. Lymph node, sentinel, biopsy, Right - METASTATIC CARCINOMA TO ONE LYMPH NODE (1/1) (CONFIRMED WITH IMMUNOHISTOCHEMICAL STAIN FOR AE1/AE3) - FOCUS OF METASTATIC CARCINOMA MEASURES JUST OVER 0.2 CM IN GREATEST DIMENSION - NO EVIDENCE OF EXTRANODAL EXTENSION   02/24/2019 Cancer Staging   Staging form: Breast, AJCC 8th Edition - Pathologic stage from 02/24/2019: Stage IB (pT1a, pN1a, cM0, G1, ER+, PR-, HER2-) - Signed by Truitt Merle, MD on 03/18/2019   04/01/2019 - 06/03/2019 Chemotherapy   adjuvant Taxol and Carboplatin every 3 weeks for 4 cycles starting in 2 weeks starting 04/01/19-06/03/19   06/2019 - 03/2020 Anti-estrogen oral therapy   Anastrozole 39m daily starting 06/2019, held after 2 weeks due to hot flashes and joint pain and RT. Switched to Exemestane in 10/2019. Switched to Exemestane in 10/2019, ultimately changed to letrozole in 01/2020 due to worsening joint pain. Ultimately stopped  anti-estrogen therapies in 03/2020 due to arthralgias    06/30/2019 Surgery   REMOVAL OF BILATERAL TISSUE EXPANDERS WITH PLACEMENT OF BILATERAL BREAST IMPLANTS by Dr DMarla Roe 06/30/19    08/17/2019 - 09/28/2019 Radiation Therapy   Adjuvant Radiaiton with Dr KSondra Come Site Technique Total Dose (Gy) Dose per Fx (Gy) Completed Fx Beam Energies  Thorax: CW_Rt_Bst_boost Electron 10/10 2 5/5 6E  Thorax: CW_Rt 3D 50/50 2 25/25 10X, 15X, 6X  Thorax: CW_Rt_axilla Complex 50/50 2 25/25 10X     12/23/2019 Survivorship   SCP delivered by LCira Rue NP    06/02/2020 Imaging   MRI brain with and without contrast IMPRESSION: 1. No acute intracranial abnormality or evidence of metastatic disease. 2. Mild cerebral white matter T2 signal changes, nonspecific though may reflect chronic small vessel ischemic disease, migraines, or prior infection/inflammation.   06/06/2020 Imaging   Bone scan IMPRESSION: No definite scintigraphic evidence of osseous metastases. Mildly abnormal uptake is noted in both knees and feet most consistent with degenerative change.      CURRENT THERAPY:  Surveillance   INTERVAL HISTORY:  Zykeriah P Fazzini is scheduled for virtail visit to discuss her PET scan results.  Unfortunately both my chart and D[employer]ity for virtual visit did not work due to poor signal, and I called her on the phone.  She is clinically doing well, no new complaints. All other systems were reviewed with the patient and are negative.  MEDICAL HISTORY:  Past Medical History:  Diagnosis Date  . Anemia   . Anxiety   . Arthritis   .  Cancer [facility])    right breast  . Family history of adverse reaction to anesthesia    sister has same issues  . GERD (gastroesophageal reflux disease)   . Neuromuscular disorder (H[facility])    neuropathy  . PONV (postoperative nausea and vomiting)     SURGICAL HISTORY: Past Surgical History:  Procedure Laterality Date  . BREAST RECONSTRUCTION WITH PLACEMENT OF TISSUE  EXPANDER AND FLEX HD (ACELLULAR HYDRATED DERMIS) Bilateral 02/24/2019   Procedure: BREAST RECONSTRUCTION WITH PLACEMENT OF TISSUE EXPANDER AND FLEX HD (ACELLULAR HYDRATED DERMIS);  Surgeon: Wallace Going, DO;  Location: [place];  Service: Plastics;  Laterality: Bilateral;  . BREAST SURGERY     left breast bx  . ESOPHAGOGASTRODUODENOSCOPY (EGD) WITH ESOPHAGEAL DILATION    . EYE SURGERY     dart to the eye  (right)  . FACIAL FRACTURE SURGERY    . FRACTURE SURGERY    . KNEE SURGERY    . MASTECTOMY W/ SENTINEL NODE BIOPSY Bilateral 02/24/2019   Procedure: RIGHT MASTECTOMY WITH SENTINEL LYMPH NODE MAPPING AND LEFT PROPHYLACTIC MASTECTOMY;  Surgeon: Jovita Kussmaul, MD;  Location: [place];  Service: General;  Laterality: Bilateral;  . REMOVAL OF BILATERAL TISSUE EXPANDERS WITH PLACEMENT OF BILATERAL BREAST IMPLANTS Bilateral 06/30/2019   Procedure: REMOVAL OF BILATERAL TISSUE EXPANDERS WITH PLACEMENT OF BILATERAL BREAST IMPLANTS;  Surgeon: Wallace Going, DO;  Location: [place];  Service: Plastics;  Laterality: Bilateral;  . SHOULDER SURGERY      I have reviewed the social history and family history with the patient and they are unchanged from previous note.  ALLERGIES:  is allergic to codeine and tramadol.  MEDICATIONS:  Current Outpatient Medications  Medication Sig Dispense Refill  . Cholecalciferol (VITAMIN D3) 125 MCG (5000 UT) CAPS Take 5,000 Units by mouth daily.    Marland Kitchen ibuprofen (ADVIL,MOTRIN) 200 MG tablet Take 600 mg by mouth every 6 (six) hours as needed for moderate pain.     Marland Kitchen LORazepam (ATIVAN) 1 MG tablet Take 1 tablet (1 mg total) by mouth once as needed for up to 1 dose for anxiety (before MRI). Take 1 tab 30-60 mins before MRI, OK to take second one before MRI if needed 2 tablet 0  . Multiple Vitamin (MULTIVITAMIN WITH MINERALS) TABS tablet Take 1 tablet by mouth daily.    Marland Kitchen omeprazole (PRILOSEC) 20 MG capsule Take 20 mg by mouth daily.    . potassium  chloride SA (KLOR-CON M20) 20 MEQ tablet TAKE 1 TABLET ONCE DAILY ON MONDAYS WEDNESDAYS AND FRIDAYS 45 tablet 1  . venlafaxine XR (EFFEXOR-XR) 75 MG 24 hr capsule TAKE 1 CAPSULE BY MOUTH DAILY WITH BREAKFAST. 90 capsule 1  . zolpidem (AMBIEN) 5 MG tablet Take 1 tablet (5 mg total) by mouth at bedtime as needed for sleep. 30 tablet 0   No current facility-administered medications for this visit.    PHYSICAL EXAMINATION: No exam today     LABORATORY DATA:  I have reviewed the data as listed CBC Latest Ref Rng & Units 01/04/2021 10/10/2020 05/14/2020  WBC 4.0 - 10.5 K/uL 4.4 5.8 6.1  Hemoglobin 12.0 - 15.0 g/dL 12.1 11.8(L) 12.8  Hematocrit 36.0 - 46.0 % 37.9 37.1 39.4  Platelets 150 - 400 K/uL 302 266 300     CMP Latest Ref Rng & Units 01/04/2021 10/10/2020 05/14/2020  Glucose 70 - 99 mg/dL 73 98 85  BUN 6 - 20 mg/dL '10 8 10  ' Creatinine 0.44 - 1.00 mg/dL 0.74 0.77  0.67  Sodium 135 - 145 mmol/L 142 142 141  Potassium 3.5 - 5.1 mmol/L 3.5 4.0 3.5  Chloride 98 - 111 mmol/L 109 108 105  CO2 22 - 32 mmol/L '25 26 22  ' Calcium 8.9 - 10.3 mg/dL 9.6 10.1 9.8  Total Protein 6.5 - 8.1 g/dL 7.2 7.1 7.7  Total Bilirubin 0.3 - 1.2 mg/dL 0.4 0.3 0.5  Alkaline Phos 38 - 126 U/L 101 108 93  AST 15 - 41 U/L '17 17 23  ' ALT 0 - 44 U/L '14 19 17      ' RADIOGRAPHIC STUDIES: I have personally reviewed the radiological images as listed and agreed with the findings in the report. No results found.   ASSESSMENT & PLAN:  KHYLA MCCUMBERS is a 55 y.o. female with    1.Malignant neoplasm of upper-outer quadrant of right breast,invasive lobular carcinoma, stageIA,p(T1a,N1a,M0), ER10%+, PR-, HER2-Grade I -She was diagnosed in 12/2018. She is s/p b/l mastectomyon3/11/20 with reconstruction. She complete adjuvant chemo, RT and tried multiple AIs from 06/2019 (Anastroszole, Exemestane, Letrozole) but ultimately stopped in 03/2020 due to arthralgia.  Due to her weak ER, the benefit of antiestrogen therapy  is also limited.  -Although she has family history, she did not proceed with genetic testing.  -I personally reviewed her PET scan images, and discussed the results with her and her husband in details.  She has multiple hypermetabolic mediastinal, internal mammary and left axillary lymph nodes, the largest was subcarinal lymph node measuring 1.5 cm.  She also has a solitary hypermetabolic 8 mm right lower lobe pulmonary nodule, and indeterminate small foci of hypermetabolic activity in the left thoracic paraspinal soft tissue.  She does report mild left mid back pain. -I recommend lymph node biopsy if feasible.  I plan to discuss with IR for left axillary lymph node biopsy, or bronchoscopy with subcarinal lymph node biopsy -I will see her back after biopsy.     PLAN: -PET scan findings reviewed with patient -We will discuss with IR and pulmonary to determine the best approach for lymph node biopsy -Follow-up after biopsy  I discussed the assessment and treatment plan with the patient. The patient was provided an opportunity to ask questions and all were answered. The patient agreed with the plan and demonstrated an understanding of the instructions.   The patient was advised to call back or seek an in-person evaluation if the symptoms worsen or if the condition fails to improve as anticipated.  I provided 25 minutes of non face-to-face telephone visit time during this encounter, and > 50% was spent counseling as documented under my assessment & plan.      Truitt Merle, MD 04/02/2021

## 2021-04-03 ENCOUNTER — Encounter: Payer: Self-pay | Admitting: Hematology

## 2021-04-03 ENCOUNTER — Inpatient Hospital Stay: Payer: BC Managed Care – PPO | Attending: Hematology | Admitting: Hematology

## 2021-04-03 DIAGNOSIS — Z170 Estrogen receptor positive status [ER+]: Secondary | ICD-10-CM

## 2021-04-03 DIAGNOSIS — C50411 Malignant neoplasm of upper-outer quadrant of right female breast: Secondary | ICD-10-CM

## 2021-04-05 ENCOUNTER — Other Ambulatory Visit: Payer: Self-pay | Admitting: *Deleted

## 2021-04-05 DIAGNOSIS — R9389 Abnormal findings on diagnostic imaging of other specified body structures: Secondary | ICD-10-CM

## 2021-04-05 DIAGNOSIS — C50411 Malignant neoplasm of upper-outer quadrant of right female breast: Secondary | ICD-10-CM

## 2021-04-05 DIAGNOSIS — Z170 Estrogen receptor positive status [ER+]: Secondary | ICD-10-CM

## 2021-04-05 NOTE — Addendum Note (Signed)
Addended by: Truitt Merle on: 04/05/2021 08:29 AM   Modules accepted: Orders

## 2021-04-06 ENCOUNTER — Other Ambulatory Visit: Payer: Self-pay | Admitting: Hematology

## 2021-04-06 DIAGNOSIS — C50411 Malignant neoplasm of upper-outer quadrant of right female breast: Secondary | ICD-10-CM

## 2021-04-10 ENCOUNTER — Other Ambulatory Visit: Payer: Self-pay

## 2021-04-10 ENCOUNTER — Ambulatory Visit
Admission: RE | Admit: 2021-04-10 | Discharge: 2021-04-10 | Disposition: A | Discharge status: Home / Self Care | Payer: BC Managed Care – PPO | Source: Ambulatory Visit | Attending: Hematology | Admitting: Hematology

## 2021-04-10 ENCOUNTER — Telehealth: Payer: Self-pay | Admitting: Hematology

## 2021-04-10 DIAGNOSIS — R9389 Abnormal findings on diagnostic imaging of other specified body structures: Secondary | ICD-10-CM

## 2021-04-10 DIAGNOSIS — C50411 Malignant neoplasm of upper-outer quadrant of right female breast: Secondary | ICD-10-CM

## 2021-04-10 DIAGNOSIS — Z170 Estrogen receptor positive status [ER+]: Secondary | ICD-10-CM

## 2021-04-10 NOTE — Telephone Encounter (Signed)
Sch per 4/25 sch msg, Patient aware 

## 2021-04-11 NOTE — Progress Notes (Incomplete)
[place]   [phone] [phone]   Clinic Follow up Note   Patient Care Team: Merwyn Katos as PCP - General (Physician Assistant) Jovita Kussmaul, MD as Consulting Physician (General Surgery) Truitt Merle, MD as Consulting Physician (Hematology) Gery Pray, MD as Consulting Physician (Radiation Oncology) Rockwell Germany, RN as Oncology Nurse Navigator Mauro Kaufmann, RN as Oncology Nurse Navigator Alla Feeling, NP as Nurse Practitioner (Nurse Practitioner)   I connected with [name] on 04/11/2021 at  4:00 PM EDT by {Blank single:19197::"video enabled telemedicine visit","telephone visit"} and verified that I am speaking with the correct person using two identifiers.  I discussed the limitations, risks, security and privacy concerns of performing an evaluation and management service by telephone and the availability of in person appointments. I also discussed with the patient that there may be a patient responsible charge related to this service. The patient expressed understanding and agreed to proceed.   Other persons participating in the visit and their role in the encounter:  ***  Patient's location:  *** Provider's location:  ***  CHIEF COMPLAINT: F/u of right breast cancer   SUMMARY OF ONCOLOGIC HISTORY: Oncology History Overview Note  Cancer Staging Malignant neoplasm of upper-outer quadrant of right breast in female, estrogen receptor positive ([place]) Staging form: Breast, AJCC 8th Edition - Clinical stage from 01/15/2019: Stage IA (cT1b, cN0, cM0, G1, ER+, PR-, HER2-) - Signed by Truitt Merle, MD on 01/26/2019 - Pathologic stage from 02/24/2019: Stage IB (pT1a, pN1a, cM0, G1, ER+, PR-, HER2-) - Signed by Truitt Merle, MD on 03/18/2019     Malignant neoplasm of upper-outer quadrant of right breast in female, estrogen receptor positive ([place])  01/15/2019 Cancer Staging   Staging form: Breast, AJCC 8th Edition - Clinical  stage from 01/15/2019: Stage IA (cT1b, cN0, cM0, G1, ER+, PR-, HER2-) - Signed by Truitt Merle, MD on 01/26/2019   01/15/2019 Mammogram   Diagnostic Mammogram 01/15/19  IMPRESSION Targeted ultrasound is performed, showing right breast 10 o'clock 5 cm from the nipple hypoechoic round mass measuring 0.7 x 0.5 x 0.6 cm. Adjacent to it there is a probable complicated cyst measuring 4 mm. There is no evidence of right axillary lymphadenopathy.   01/15/2019 Initial Biopsy   Diagnosis 01/15/19  Breast, right, needle core biopsy, upper outer - INVASIVE AND IN SITU MAMMARY CARCINOMA WITH CALCIFICATIONS. - SEE COMMENT. 1 of 3 FINAL for Snyder, [name] ([id number]) Microscopic Comment The carcinoma appears grade I. The malignant cells are positive for cytokeratin AE1/AE3 and negative for E-cadherin. A breast prognostic profile will be performed and the results reported separately. Dr Vicente Males has reviewed the case and concurs with this interpretation. The results are called to [facility] on 01/18/2019. (JBK:ah:ecj 01/18/19)   01/15/2019 Receptors her2   PROGNOSTIC INDICATORS Results: IMMUNOHISTOCHEMICAL AND MORPHOMETRIC ANALYSIS PERFORMED MANUALLY The tumor cells are NEGATIVE for Her2 (0). Estrogen Receptor: 10%, POSITIVE, MODERATE STAINING INTENSITY Progesterone Receptor: 0%, NEGATIVE Proliferation Marker Ki67: <1%   01/21/2019 Initial Diagnosis   Malignant neoplasm of upper-outer quadrant of right breast in female, estrogen receptor positive ([place])   01/29/2019 Breast MRI   IMPRESSION: 1. The biopsy-proven site of malignancy in the upper-outer quadrant of the right breast resides centrally with in an area of suspicious non mass enhancement spanning 6-7 cm.  2. There is a 6 mm suspicious enhancing mass in the lower-inner quadrant of the right breast, posterior depth.  3. There is  a 9 mm indeterminate enhancing mass in the lower outer quadrant of the right breast.  4.   No evidence of malignancy in the left breast.   02/24/2019 Surgery   RIGHT MASTECTOMY WITH SENTINEL LYMPH NODE MAPPING AND LEFT PROPHYLACTIC MASTECTOMY by Dr. Marlou Starks  BREAST RECONSTRUCTION WITH PLACEMENT OF TISSUE EXPANDER by Dr. Marla Roe  02/24/19    02/24/2019 Pathology Results   Diagnosis 1. Breast, simple mastectomy, Left - BENIGN BREAST PARENCHYMA WITH FIBROCYSTIC CHANGE. - NEGATIVE FOR CARCINOMA. 2. Breast, simple mastectomy, Right - LOBULAR CARCINOMA IN SITU, INTERMEDIATE TO HIGH NUCLEAR GRADE. SEE NOTE. - NO EVIDENCE OF RESIDUAL INVASIVE CARCINOMA. - RESECTION MARGINS ARE NEGATIVE FOR IN SITU OR INVASIVE CARCINOMA. - BIOPSY SITE CHANGES. - SEE ONCOLOGY TABLE. 3. Lymph node, sentinel, biopsy, Right #1 - IMMUNOHISTOCHEMICAL STAIN FOR AE1/AE3 IS NEGATIVE FOR CARCINOMA (0/1). 4. Lymph node, sentinel, biopsy, Right - IMMUNOHISTOCHEMICAL STAIN FOR AE1/AE3 IS NEGATIVE FOR CARCINOMA (0/1). 5. Lymph node, sentinel, biopsy, Right #2 - IMMUNOHISTOCHEMICAL STAIN FOR AE1/AE3 IS NEGATIVE FOR CARCINOMA (0/1). 6. Lymph node, sentinel, biopsy, Right - IMMUNOHISTOCHEMICAL STAIN FOR AE1/AE3 IS NEGATIVE FOR CARCINOMA (0/1). 7. Lymph node, sentinel, biopsy, Right - METASTATIC CARCINOMA TO ONE LYMPH NODE (1/1) (CONFIRMED WITH IMMUNOHISTOCHEMICAL STAIN FOR AE1/AE3) - FOCUS OF METASTATIC CARCINOMA MEASURES JUST OVER 0.2 CM IN GREATEST DIMENSION - NO EVIDENCE OF EXTRANODAL EXTENSION   02/24/2019 Cancer Staging   Staging form: Breast, AJCC 8th Edition - Pathologic stage from 02/24/2019: Stage IB (pT1a, pN1a, cM0, G1, ER+, PR-, HER2-) - Signed by Truitt Merle, MD on 03/18/2019   04/01/2019 - 06/03/2019 Chemotherapy   adjuvant Taxol and Carboplatin every 3 weeks for 4 cycles starting in 2 weeks starting 04/01/19-06/03/19   06/2019 - 03/2020 Anti-estrogen oral therapy   Anastrozole 73m daily starting 06/2019, held after 2 weeks due to hot flashes and joint pain and RT. Switched to Exemestane in 10/2019. Switched  to Exemestane in 10/2019, ultimately changed to letrozole in 01/2020 due to worsening joint pain. Ultimately stopped anti-estrogen therapies in 03/2020 due to arthralgias    06/30/2019 Surgery   REMOVAL OF BILATERAL TISSUE EXPANDERS WITH PLACEMENT OF BILATERAL BREAST IMPLANTS by Dr DMarla Roe 06/30/19    08/17/2019 - 09/28/2019 Radiation Therapy   Adjuvant Radiaiton with Dr KSondra Come Site Technique Total Dose (Gy) Dose per Fx (Gy) Completed Fx Beam Energies  Thorax: CW_Rt_Bst_boost Electron 10/10 2 5/5 6E  Thorax: CW_Rt 3D 50/50 2 25/25 10X, 15X, 6X  Thorax: CW_Rt_axilla Complex 50/50 2 25/25 10X     12/23/2019 Survivorship   SCP delivered by LCira Rue NP    06/02/2020 Imaging   MRI brain with and without contrast IMPRESSION: 1. No acute intracranial abnormality or evidence of metastatic disease. 2. Mild cerebral white matter T2 signal changes, nonspecific though may reflect chronic small vessel ischemic disease, migraines, or prior infection/inflammation.   06/06/2020 Imaging   Bone scan IMPRESSION: No definite scintigraphic evidence of osseous metastases. Mildly abnormal uptake is noted in both knees and feet most consistent with degenerative change.      CURRENT THERAPY:  Surveillance   INTERVAL HISTORY: *** [name] presents for a virtual follow up. {They identified themselves by birth date/face to face video.}    REVIEW OF SYSTEMS:  *** Constitutional: Denies fevers, chills or abnormal weight loss Eyes: Denies blurriness of vision Ears, nose, mouth, throat, and face: Denies mucositis or sore throat Respiratory: Denies cough, dyspnea or wheezes Cardiovascular: Denies palpitation, chest discomfort or lower extremity swelling Gastrointestinal:  Denies nausea, heartburn or change in bowel habits Skin: Denies abnormal skin rashes Lymphatics: Denies new lymphadenopathy or easy bruising Neurological:Denies numbness, tingling or new weaknesses Behavioral/Psych: Mood  is stable, no new changes  All other systems were reviewed with the patient and are negative.  MEDICAL HISTORY:  Past Medical History:  Diagnosis Date  . Anemia   . Anxiety   . Arthritis   . Cancer [facility])    right breast  . Family history of adverse reaction to anesthesia    sister has same issues  . GERD (gastroesophageal reflux disease)   . Neuromuscular disorder (HCC)    neuropathy  . PONV (postoperative nausea and vomiting)     SURGICAL HISTORY: Past Surgical History:  Procedure Laterality Date  . BREAST RECONSTRUCTION WITH PLACEMENT OF TISSUE EXPANDER AND FLEX HD (ACELLULAR HYDRATED DERMIS) Bilateral 02/24/2019   Procedure: BREAST RECONSTRUCTION WITH PLACEMENT OF TISSUE EXPANDER AND FLEX HD (ACELLULAR HYDRATED DERMIS);  Surgeon: Wallace Going, DO;  Location: [place];  Service: Plastics;  Laterality: Bilateral;  . BREAST SURGERY     left breast bx  . ESOPHAGOGASTRODUODENOSCOPY (EGD) WITH ESOPHAGEAL DILATION    . EYE SURGERY     dart to the eye  (right)  . FACIAL FRACTURE SURGERY    . FRACTURE SURGERY    . KNEE SURGERY    . MASTECTOMY W/ SENTINEL NODE BIOPSY Bilateral 02/24/2019   Procedure: RIGHT MASTECTOMY WITH SENTINEL LYMPH NODE MAPPING AND LEFT PROPHYLACTIC MASTECTOMY;  Surgeon: Jovita Kussmaul, MD;  Location: [place];  Service: General;  Laterality: Bilateral;  . REMOVAL OF BILATERAL TISSUE EXPANDERS WITH PLACEMENT OF BILATERAL BREAST IMPLANTS Bilateral 06/30/2019   Procedure: REMOVAL OF BILATERAL TISSUE EXPANDERS WITH PLACEMENT OF BILATERAL BREAST IMPLANTS;  Surgeon: Wallace Going, DO;  Location: [place];  Service: Plastics;  Laterality: Bilateral;  . SHOULDER SURGERY      I have reviewed the social history and family history with the patient and they are unchanged from previous note.  ALLERGIES:  is allergic to codeine and tramadol.  MEDICATIONS:  Current Outpatient Medications  Medication Sig Dispense Refill  . Cholecalciferol (VITAMIN  D3) 125 MCG (5000 UT) CAPS Take 5,000 Units by mouth daily.    Marland Kitchen ibuprofen (ADVIL,MOTRIN) 200 MG tablet Take 600 mg by mouth every 6 (six) hours as needed for moderate pain.     Marland Kitchen LORazepam (ATIVAN) 1 MG tablet Take 1 tablet (1 mg total) by mouth once as needed for up to 1 dose for anxiety (before MRI). Take 1 tab 30-60 mins before MRI, OK to take second one before MRI if needed 2 tablet 0  . Multiple Vitamin (MULTIVITAMIN WITH MINERALS) TABS tablet Take 1 tablet by mouth daily.    Marland Kitchen omeprazole (PRILOSEC) 20 MG capsule Take 20 mg by mouth daily.    . potassium chloride SA (KLOR-CON M20) 20 MEQ tablet TAKE 1 TABLET ONCE DAILY ON MONDAYS WEDNESDAYS AND FRIDAYS 45 tablet 1  . venlafaxine XR (EFFEXOR-XR) 75 MG 24 hr capsule TAKE 1 CAPSULE BY MOUTH DAILY WITH BREAKFAST. 90 capsule 1  . zolpidem (AMBIEN) 5 MG tablet Take 1 tablet (5 mg total) by mouth at bedtime as needed for sleep. 30 tablet 0   No current facility-administered medications for this visit.    PHYSICAL EXAMINATION: ECOG PERFORMANCE STATUS: {CHL ONC ECOG PS:[phone]}  No vitals taken today, Exam not performed today   LABORATORY DATA:  I have reviewed the data as listed CBC Latest Ref Rng &  Units 01/04/2021 10/10/2020 05/14/2020  WBC 4.0 - 10.5 K/uL 4.4 5.8 6.1  Hemoglobin 12.0 - 15.0 g/dL 12.1 11.8(L) 12.8  Hematocrit 36.0 - 46.0 % 37.9 37.1 39.4  Platelets 150 - 400 K/uL 302 266 300     CMP Latest Ref Rng & Units 01/04/2021 10/10/2020 05/14/2020  Glucose 70 - 99 mg/dL 73 98 85  BUN 6 - 20 mg/dL _0 Creatinine 0.44 - 1.00 mg/dL 0.74 0.77 0.67  Sodium 135 - 145 mmol/L 142 142 141  Potassium 3.5 - 5.1 mmol/L 3.5 4.0 3.5  Chloride 98 - 111 mmol/L 109 108 105  CO2 22 - 32 mmol/L _1 Calcium 8.9 - 10.3 mg/dL 9.6 10.1 9.8  Total Protein 6.5 - 8.1 g/dL 7.2 7.1 7.7  Total Bilirubin 0.3 - 1.2 mg/dL 0.4 0.3 0.5  Alkaline Phos 38 - 126 U/L 101 108 93  AST 15 - 41 U/L _2 ALT 0 - 44 U/L _3 RADIOGRAPHIC STUDIES: I have personally reviewed the radiological images as listed and agreed with the findings in the report. [place] AXILLARY NODE CORE BIOPSY LEFT  Result Date: 04/10/2021 CLINICAL DATA:  Patient with suspicious left axillary lymph node demonstrated on prior PET-CT and on axillary ultrasound. EXAM: [place] AXILLARY NODE CORE BIOPSY LEFT COMPARISON:  Previous exam(s). PROCEDURE: I met with the patient and we discussed the procedure of ultrasound-guided biopsy, including benefits and alternatives. We discussed the high likelihood of a successful procedure. We discussed the risks of the procedure, including infection, bleeding, tissue injury, clip migration, and inadequate sampling. Informed written consent was given. The usual time-out protocol was performed immediately prior to the procedure. Using sterile technique and 1% Lidocaine as local anesthetic, under direct ultrasound visualization, a 14 gauge spring-loaded device was used to perform biopsy of left axillary lymph node using a lateral approach. At the conclusion of the procedure tribell tissue marker clip was deployed into the biopsy cavity. Follow up 2 view mammogram was performed and dictated separately. IMPRESSION: Ultrasound guided biopsy of left axillary lymph node. No apparent complications. Electronically Signed   By: Lovey Newcomer M.D.   On: 04/10/2021 14:35   [place] AXILLA LEFT  Result Date: 04/10/2021 CLINICAL DATA:  Patient with recent PET scan demonstrating adenopathy within the mediastinal location, internal mammary location and left axilla. Patient for evaluation of left axillary adenopathy. EXAM: ULTRASOUND OF THE LEFT AXILLA COMPARISON:  Previous exam(s). FINDINGS: Ultrasound is performed, showing a focally cortically thickened node within the left axilla. Cortical thickening measures up to 4 mm. IMPRESSION: Suspicious left axillary lymph node. RECOMMENDATION: Ultrasound-guided core needle biopsy suspicious left axillary  lymph node. I have discussed the findings and recommendations with the patient. If applicable, a reminder letter will be sent to the patient regarding the next appointment. BI-RADS CATEGORY  4: Suspicious. Electronically Signed   By: Lovey Newcomer M.D.   On: 04/10/2021 14:32     ASSESSMENT & PLAN:  [name] is a 55 y.o. female with    1.Malignant neoplasm of upper-outer quadrant of right breast,invasive lobular carcinoma, stageIA,p(T1a,N1a,M0), ER10%+, PR-, HER2-Grade I -She was diagnosed in 12/2018. She is s/p b/l mastectomyon3/11/20 with reconstruction. She complete adjuvant chemo, RT and tried multiple AIs from 06/2019 (Anastroszole, Exemestane, Letrozole) but ultimately stopped in 03/2020 due to arthralgia.  Due to her weak ER, the benefit of antiestrogen therapy is also limited.  -Although she has family history, she did  not proceed with genetic testing.  ***   -I personally reviewed her PET scan images, and discussed the results with her and her husband in details.  She has multiple hypermetabolic mediastinal, internal mammary and left axillary lymph nodes, the largest was subcarinal lymph node measuring 1.5 cm.  She also has a solitary hypermetabolic 8 mm right lower lobe pulmonary nodule, and indeterminate small foci of hypermetabolic activity in the left thoracic paraspinal soft tissue.  She does report mild left mid back pain. -I recommend lymph node biopsy if feasible.  I plan to discuss with IR for left axillary lymph node biopsy, or bronchoscopy with subcarinal lymph node biopsy -I will see her back after biopsy.     PLAN: *** -PET scan findings reviewed with patient -We will discuss with IR and pulmonary to determine the best approach for lymph node biopsy -Follow-up after biopsy   No problem-specific Assessment & Plan notes found for this encounter.   No orders of the defined types were placed in this encounter.  I discussed the assessment and treatment  plan with the patient. The patient was provided an opportunity to ask questions and all were answered. The patient agreed with the plan and demonstrated an understanding of the instructions.  The patient was advised to call back or seek an in-person evaluation if the symptoms worsen or if the condition fails to improve as anticipated.  The total time spent in the appointment was {CHL ONC TIME VISIT - [id number]}.    Joslyn Devon 04/11/2021   Oneal Deputy, am acting as scribe for Truitt Merle, MD.   {Add scribe attestation statement}

## 2021-04-12 ENCOUNTER — Encounter: Payer: Self-pay | Admitting: Hematology

## 2021-04-12 ENCOUNTER — Inpatient Hospital Stay (HOSPITAL_BASED_OUTPATIENT_CLINIC_OR_DEPARTMENT_OTHER): Payer: BC Managed Care – PPO | Admitting: Hematology

## 2021-04-12 DIAGNOSIS — D861 Sarcoidosis of lymph nodes: Secondary | ICD-10-CM | POA: Diagnosis not present

## 2021-04-12 NOTE — Progress Notes (Signed)
[place]   [phone] Fax:(336) [phone]   Clinic Follow up Note   Patient Care Team: [name] as PCP - General (Physician Assistant) Tiffany Kussmaul, MD as Consulting Physician (General Surgery) Tiffany Merle, MD as Consulting Physician (Hematology) Tiffany Pray, MD as Consulting Physician (Radiation Oncology) Tiffany Germany, RN as Oncology Nurse Navigator Tiffany Kaufmann, RN as Oncology Nurse Navigator Tiffany Feeling, NP as Nurse Practitioner (Nurse Practitioner)  Date of Service:  04/12/2021   I connected with [name] on 04/12/2021 at 12:00 PM EDT by telephone visit and verified that I am speaking with the correct person using two identifiers.  I discussed the limitations, risks, security and privacy concerns of performing an evaluation and management service by telephone and the availability of in person appointments. I also discussed with the patient that there may be a patient responsible charge related to this service. The patient expressed understanding and agreed to proceed.   Other persons participating in the visit and their role in the encounter:  [name], scribe  Patient's location:  home Provider's location:  My office  CHIEF COMPLAINT: f/u of breast cancer  SUMMARY OF ONCOLOGIC HISTORY: Oncology History Overview Note  Cancer Staging Malignant neoplasm of upper-outer quadrant of right breast in female, estrogen receptor positive ([name]) Staging form: Breast, AJCC 8th Edition - Clinical stage from 01/15/2019: Stage IA (cT1b, cN0, cM0, G1, ER+, PR-, HER2-) - Signed by Tiffany Merle, MD on 01/26/2019 - Pathologic stage from 02/24/2019: Stage IB (pT1a, pN1a, cM0, G1, ER+, PR-, HER2-) - Signed by Tiffany Merle, MD on 03/18/2019     Malignant neoplasm of upper-outer quadrant of right breast in female, estrogen receptor positive ([place])  01/15/2019 Cancer Staging   Staging form: Breast, AJCC 8th Edition - Clinical stage  from 01/15/2019: Stage IA (cT1b, cN0, cM0, G1, ER+, PR-, HER2-) - Signed by Tiffany Merle, MD on 01/26/2019   01/15/2019 Mammogram   Diagnostic Mammogram 01/15/19  IMPRESSION Targeted ultrasound is performed, showing right breast 10 o'clock 5 cm from the nipple hypoechoic round mass measuring 0.7 x 0.5 x 0.6 cm. Adjacent to it there is a probable complicated cyst measuring 4 mm. There is no evidence of right axillary lymphadenopathy.   01/15/2019 Initial Biopsy   Diagnosis 01/15/19  Breast, right, needle core biopsy, upper outer - INVASIVE AND IN SITU MAMMARY CARCINOMA WITH CALCIFICATIONS. - SEE COMMENT. 1 of 3 FINAL for [name], [name] ([id number]) Microscopic Comment The carcinoma appears grade I. The malignant cells are positive for cytokeratin AE1/AE3 and negative for E-cadherin. A breast prognostic profile will be performed and the results reported separately. [name] has reviewed the case and concurs with this interpretation. The results are called to [facility] on 01/18/2019. (JBK:ah:ecj 01/18/19)   01/15/2019 Receptors her2   PROGNOSTIC INDICATORS Results: IMMUNOHISTOCHEMICAL AND MORPHOMETRIC ANALYSIS PERFORMED MANUALLY The tumor cells are NEGATIVE for Her2 (0). Estrogen Receptor: 10%, POSITIVE, MODERATE STAINING INTENSITY Progesterone Receptor: 0%, NEGATIVE Proliferation Marker Ki67: <1%   01/21/2019 Initial Diagnosis   Malignant neoplasm of upper-outer quadrant of right breast in female, estrogen receptor positive ([place])   01/29/2019 Breast MRI   IMPRESSION: 1. The biopsy-proven site of malignancy in the upper-outer quadrant of the right breast resides centrally with in an area of suspicious non mass enhancement spanning 6-7 cm.  2. There is a 6 mm suspicious enhancing mass in the lower-inner quadrant of the right breast, posterior depth.  3.  There is a 9 mm indeterminate enhancing mass in the lower outer quadrant of the right breast.  4.  No  evidence of malignancy in the left breast.   02/24/2019 Surgery   RIGHT MASTECTOMY WITH SENTINEL LYMPH NODE MAPPING AND LEFT PROPHYLACTIC MASTECTOMY by Tiffany. Marlou Snyder  BREAST RECONSTRUCTION WITH PLACEMENT OF TISSUE EXPANDER by Tiffany. Marla Snyder  02/24/19    02/24/2019 Pathology Results   Diagnosis 1. Breast, simple mastectomy, Left - BENIGN BREAST PARENCHYMA WITH FIBROCYSTIC CHANGE. - NEGATIVE FOR CARCINOMA. 2. Breast, simple mastectomy, Right - LOBULAR CARCINOMA IN SITU, INTERMEDIATE TO HIGH NUCLEAR GRADE. SEE NOTE. - NO EVIDENCE OF RESIDUAL INVASIVE CARCINOMA. - RESECTION MARGINS ARE NEGATIVE FOR IN SITU OR INVASIVE CARCINOMA. - BIOPSY SITE CHANGES. - SEE ONCOLOGY TABLE. 3. Lymph node, sentinel, biopsy, Right #1 - IMMUNOHISTOCHEMICAL STAIN FOR AE1/AE3 IS NEGATIVE FOR CARCINOMA (0/1). 4. Lymph node, sentinel, biopsy, Right - IMMUNOHISTOCHEMICAL STAIN FOR AE1/AE3 IS NEGATIVE FOR CARCINOMA (0/1). 5. Lymph node, sentinel, biopsy, Right #2 - IMMUNOHISTOCHEMICAL STAIN FOR AE1/AE3 IS NEGATIVE FOR CARCINOMA (0/1). 6. Lymph node, sentinel, biopsy, Right - IMMUNOHISTOCHEMICAL STAIN FOR AE1/AE3 IS NEGATIVE FOR CARCINOMA (0/1). 7. Lymph node, sentinel, biopsy, Right - METASTATIC CARCINOMA TO ONE LYMPH NODE (1/1) (CONFIRMED WITH IMMUNOHISTOCHEMICAL STAIN FOR AE1/AE3) - FOCUS OF METASTATIC CARCINOMA MEASURES JUST OVER 0.2 CM IN GREATEST DIMENSION - NO EVIDENCE OF EXTRANODAL EXTENSION   02/24/2019 Cancer Staging   Staging form: Breast, AJCC 8th Edition - Pathologic stage from 02/24/2019: Stage IB (pT1a, pN1a, cM0, G1, ER+, PR-, HER2-) - Signed by Tiffany Merle, MD on 03/18/2019   04/01/2019 - 06/03/2019 Chemotherapy   adjuvant Taxol and Carboplatin every 3 weeks for 4 cycles starting in 2 weeks starting 04/01/19-06/03/19   06/2019 - 03/2020 Anti-estrogen oral therapy   Anastrozole 33m daily starting 06/2019, held after 2 weeks due to hot flashes and joint pain and RT. Switched to Exemestane in 10/2019. Switched to  Exemestane in 10/2019, ultimately changed to letrozole in 01/2020 due to worsening joint pain. Ultimately stopped anti-estrogen therapies in 03/2020 due to arthralgias    06/30/2019 Surgery   REMOVAL OF BILATERAL TISSUE EXPANDERS WITH PLACEMENT OF BILATERAL BREAST IMPLANTS by Tiffany DMarla Snyder 06/30/19    08/17/2019 - 09/28/2019 Radiation Therapy   Adjuvant Radiaiton with Tiffany KSondra Come Site Technique Total Dose (Gy) Dose per Fx (Gy) Completed Fx Beam Energies  Thorax: CW_Rt_Bst_boost Electron 10/10 2 5/5 6E  Thorax: CW_Rt 3D 50/50 2 25/25 10X, 15X, 6X  Thorax: CW_Rt_axilla Complex 50/50 2 25/25 10X     12/23/2019 Survivorship   SCP delivered by LCira Rue NP    06/02/2020 Imaging   MRI brain with and without contrast IMPRESSION: 1. No acute intracranial abnormality or evidence of metastatic disease. 2. Mild cerebral white matter T2 signal changes, nonspecific though may reflect chronic small vessel ischemic disease, migraines, or prior infection/inflammation.   06/06/2020 Imaging   Bone scan IMPRESSION: No definite scintigraphic evidence of osseous metastases. Mildly abnormal uptake is noted in both knees and feet most consistent with degenerative change.   03/05/2021 Imaging   CT Chest  IMPRESSION: 1. Resolution of ground-glass in the RIGHT upper lobe that was seen on the previous imaging. Compatible with resolution of inflammation. 2. Tiny basilar nodules at the LEFT lung base unchanged since May of 2021 and seen in hind site on the study of May of 2020, largest 3 mm in the LEFT lower lobe. These are likely benign. 3. Slowly enlarging LEFT internal mammary lymph nodes (less than  a cm but increasing in size) the with scattered small lymph nodes throughout the chest. Presence of internal mammary lymph nodes is suspicious given patient history. Could consider short interval follow-up in 3 months or PET imaging for further assessment. Smaller internal mammary lymph nodes are present on  the RIGHT. 4. 3 mm calculus in the upper pole the RIGHT kidney. 5. Aortic atherosclerosis.   Aortic Atherosclerosis (ICD10-I70.0).   03/29/2021 PET scan   IMPRESSION: 1. There are multiple hypermetabolic mediastinal, internal mammary and left axillary lymph nodes as described. The intensity of the hypermetabolic activity and distribution of the lymph nodes are suspicious for metastatic disease in this patient with a history of breast cancer. 2. Solitary hypermetabolic right lower lobe pulmonary nodule, also suspicious for metastasis. 3. Indeterminate small foci of hypermetabolic activity in the left thoracic paraspinal soft tissues. 4. No evidence of metastatic disease in the abdomen, pelvis or bones. 5. Bilateral nephrolithiasis.   04/10/2021 Imaging   [place] Left Axillary  IMPRESSION: Suspicious left axillary lymph node.   RECOMMENDATION: Ultrasound-guided core needle biopsy suspicious left axillary lymph node.   04/10/2021 Pathology Results   Diagnosis Lymph node, needle/core biopsy, left axillary node - GRANULOMATOUS INFLAMMATION. Microscopic Comment Granulomatous inflammation is epithelioid in nature. The differential diagnosis includes sarcoidosis. Clinical correlation is recommended. Special stains AFB (acid-fast bacillus) and GMS (fungus) will be reported separately. Tiffany. Melina Copa reviewed the case      CURRENT THERAPY:  Surveillance  INTERVAL HISTORY:  [name] was contacted to discuss her recent biopsy. She was last seen by me on 01/04/21 in the office, and she was contacted via telephone on 04/03/21.  She notes she is doing well and had no problems with the biopsy. She states she was called with the pathology results. She reports shooting pain and tightness on her right side.  REVIEW OF SYSTEMS:   Constitutional: Denies fevers, chills or abnormal weight loss Eyes: Denies blurriness of vision Ears, nose, mouth, throat, and face: Denies mucositis or sore  throat Respiratory: Denies cough, dyspnea or wheezes Cardiovascular: Denies palpitation, chest discomfort or lower extremity swelling Gastrointestinal:  Denies nausea, heartburn or change in bowel habits Skin: Denies abnormal skin rashes Lymphatics: Denies new lymphadenopathy or easy bruising Neurological:Denies numbness, tingling or new weaknesses Behavioral/Psych: Mood is stable, no new changes  All other systems were reviewed with the patient and are negative.  MEDICAL HISTORY:  Past Medical History:  Diagnosis Date  . Anemia   . Anxiety   . Arthritis   . Cancer [facility])    right breast  . Family history of adverse reaction to anesthesia    sister has same issues  . GERD (gastroesophageal reflux disease)   . Neuromuscular disorder (H[facility])    neuropathy  . PONV (postoperative nausea and vomiting)     SURGICAL HISTORY: Past Surgical History:  Procedure Laterality Date  . BREAST RECONSTRUCTION WITH PLACEMENT OF TISSUE EXPANDER AND FLEX HD (ACELLULAR HYDRATED DERMIS) Bilateral 02/24/2019   Procedure: BREAST RECONSTRUCTION WITH PLACEMENT OF TISSUE EXPANDER AND FLEX HD (ACELLULAR HYDRATED DERMIS);  Surgeon: Wallace Going, DO;  Location: [place];  Service: Plastics;  Laterality: Bilateral;  . BREAST SURGERY     left breast bx  . ESOPHAGOGASTRODUODENOSCOPY (EGD) WITH ESOPHAGEAL DILATION    . EYE SURGERY     dart to the eye  (right)  . FACIAL FRACTURE SURGERY    . FRACTURE SURGERY    . KNEE SURGERY    . MASTECTOMY W/ SENTINEL NODE BIOPSY  Bilateral 02/24/2019   Procedure: RIGHT MASTECTOMY WITH SENTINEL LYMPH NODE MAPPING AND LEFT PROPHYLACTIC MASTECTOMY;  Surgeon: Tiffany Kussmaul, MD;  Location: [place];  Service: General;  Laterality: Bilateral;  . REMOVAL OF BILATERAL TISSUE EXPANDERS WITH PLACEMENT OF BILATERAL BREAST IMPLANTS Bilateral 06/30/2019   Procedure: REMOVAL OF BILATERAL TISSUE EXPANDERS WITH PLACEMENT OF BILATERAL BREAST IMPLANTS;  Surgeon: Wallace Going, DO;   Location: [place];  Service: Plastics;  Laterality: Bilateral;  . SHOULDER SURGERY      I have reviewed the social history and family history with the patient and they are unchanged from previous note.  ALLERGIES:  is allergic to codeine and tramadol.  MEDICATIONS:  Current Outpatient Medications  Medication Sig Dispense Refill  . Cholecalciferol (VITAMIN D3) 125 MCG (5000 UT) CAPS Take 5,000 Units by mouth daily.    Marland Kitchen ibuprofen (ADVIL,MOTRIN) 200 MG tablet Take 600 mg by mouth every 6 (six) hours as needed for moderate pain.     Marland Kitchen LORazepam (ATIVAN) 1 MG tablet Take 1 tablet (1 mg total) by mouth once as needed for up to 1 dose for anxiety (before MRI). Take 1 tab 30-60 mins before MRI, OK to take second one before MRI if needed 2 tablet 0  . Multiple Vitamin (MULTIVITAMIN WITH MINERALS) TABS tablet Take 1 tablet by mouth daily.    Marland Kitchen omeprazole (PRILOSEC) 20 MG capsule Take 20 mg by mouth daily.    . potassium chloride SA (KLOR-CON M20) 20 MEQ tablet TAKE 1 TABLET ONCE DAILY ON MONDAYS WEDNESDAYS AND FRIDAYS 45 tablet 1  . venlafaxine XR (EFFEXOR-XR) 75 MG 24 hr capsule TAKE 1 CAPSULE BY MOUTH DAILY WITH BREAKFAST. 90 capsule 1  . zolpidem (AMBIEN) 5 MG tablet Take 1 tablet (5 mg total) by mouth at bedtime as needed for sleep. 30 tablet 0   No current facility-administered medications for this visit.    PHYSICAL EXAMINATION: ECOG PERFORMANCE STATUS: 1 - Symptomatic but completely ambulatory  There were no vitals filed for this visit. There were no vitals filed for this visit.  No vitals taken today, Exam not performed today  LABORATORY DATA:  I have reviewed the data as listed CBC Latest Ref Rng & Units 01/04/2021 10/10/2020 05/14/2020  WBC 4.0 - 10.5 K/uL 4.4 5.8 6.1  Hemoglobin 12.0 - 15.0 g/dL 12.1 11.8(L) 12.8  Hematocrit 36.0 - 46.0 % 37.9 37.1 39.4  Platelets 150 - 400 K/uL 302 266 300     CMP Latest Ref Rng & Units 01/04/2021 10/10/2020 05/14/2020   Glucose 70 - 99 mg/dL 73 98 85  BUN 6 - 20 mg/dL '10 8 10  ' Creatinine 0.44 - 1.00 mg/dL 0.74 0.77 0.67  Sodium 135 - 145 mmol/L 142 142 141  Potassium 3.5 - 5.1 mmol/L 3.5 4.0 3.5  Chloride 98 - 111 mmol/L 109 108 105  CO2 22 - 32 mmol/L '25 26 22  ' Calcium 8.9 - 10.3 mg/dL 9.6 10.1 9.8  Total Protein 6.5 - 8.1 g/dL 7.2 7.1 7.7  Total Bilirubin 0.3 - 1.2 mg/dL 0.4 0.3 0.5  Alkaline Phos 38 - 126 U/L 101 108 93  AST 15 - 41 U/L '17 17 23  ' ALT 0 - 44 U/L '14 19 17      ' RADIOGRAPHIC STUDIES: I have personally reviewed the radiological images as listed and agreed with the findings in the report. No results found.   ASSESSMENT & PLAN:  [name] is a 55 y.o. female with   1.Malignant  neoplasm of upper-outer quadrant of right breast,invasive lobular carcinoma, stageIA,Snyder(T1a,N1a,M0), ER10%+, PR-, HER2-Grade I -She was diagnosed in 12/2018. She is s/Snyder b/l mastectomyon3/11/20 with reconstruction. She complete adjuvant chemo, RT and tried multiple AIs from 06/2019 (Anastroszole, Exemestane, Letrozole) but ultimately stopped in 03/2020 due to arthralgia.  Due to her weak ER, the benefit of antiestrogen therapy is also limited.  -Although she has family history, she did not proceed with genetic testing.  -She reports shooting pain and tightness to her right side. I reassured her that this is likely related to her breast surgery and subsequent radiation therapy. -her recent left axillary node biopsy was negative for malignancy  -f/u in 3 months   2. Sarcoidosis -PET scan from 03/29/21 showed multiple hypermetabolic mediastinal, internal mammary and left axillary lymph nodes, the largest was subcarinal lymph node measuring 1.5 cm.  She also has a solitary hypermetabolic 8 mm right lower lobe pulmonary nodule, and indeterminate small foci of hypermetabolic activity in the left thoracic paraspinal soft tissue.  -Lymph node biopsy on 04/10/21 showed granulomatous inflammation, giving the scan  findings, this is most consistent with sarcoidosis. -I think her thoracic adenopathy are all related to sarcoidosis, I do not think she needs additional biopsy of mediastinal nodes  -I will refer her to pulmonology for further management, I contacted Tiffany. Valeta Harms and he recommends Tiffany. Loanne Drilling. Referral was made today   PLAN: -Referral to pulmonology Tiffany. Loanne Drilling  -cancel appointments in June  -F/u in 3 months    No problem-specific Assessment & Plan notes found for this encounter.   Orders Placed This Encounter  Procedures  . Ambulatory referral to Pulmonology    Referral Priority:   Routine    Referral Type:   Consultation    Referral Reason:   Specialty Services Required    Requested Specialty:   Pulmonary Disease    Number of Visits Requested:   1   All questions were answered. The patient knows to call the clinic with any problems, questions or concerns. No barriers to learning was detected. The total time spent in the appointment was 22 minutes.     Tiffany Merle, MD 04/12/2021   I, [name], am acting as scribe for Tiffany Merle, MD.   I have reviewed the above documentation for accuracy and completeness, and I agree with the above.

## 2021-04-13 ENCOUNTER — Inpatient Hospital Stay: Payer: BC Managed Care – PPO | Admitting: Hematology

## 2021-04-13 ENCOUNTER — Ambulatory Visit: Payer: BC Managed Care – PPO | Admitting: Hematology

## 2021-05-09 ENCOUNTER — Encounter: Payer: Self-pay | Admitting: Hematology

## 2021-05-15 ENCOUNTER — Encounter: Payer: Self-pay | Admitting: Hematology

## 2021-05-16 ENCOUNTER — Other Ambulatory Visit: Payer: Self-pay | Admitting: Hematology

## 2021-05-16 ENCOUNTER — Encounter: Payer: Self-pay | Admitting: Hematology

## 2021-05-16 ENCOUNTER — Other Ambulatory Visit (INDEPENDENT_AMBULATORY_CARE_PROVIDER_SITE_OTHER): Payer: BC Managed Care – PPO

## 2021-05-16 ENCOUNTER — Other Ambulatory Visit: Payer: Self-pay

## 2021-05-16 ENCOUNTER — Ambulatory Visit: Payer: BC Managed Care – PPO | Admitting: Pulmonary Disease

## 2021-05-16 ENCOUNTER — Encounter: Payer: Self-pay | Admitting: Pulmonary Disease

## 2021-05-16 VITALS — BP 110/68 | HR 79 | Temp 98.4°F | Ht 62.5 in | Wt 171.2 lb

## 2021-05-16 DIAGNOSIS — D869 Sarcoidosis, unspecified: Secondary | ICD-10-CM

## 2021-05-16 DIAGNOSIS — R079 Chest pain, unspecified: Secondary | ICD-10-CM | POA: Diagnosis not present

## 2021-05-16 LAB — COMPREHENSIVE METABOLIC PANEL
ALT: 22 U/L (ref 0–35)
AST: 18 U/L (ref 0–37)
Albumin: 4.3 g/dL (ref 3.5–5.2)
Alkaline Phosphatase: 87 U/L (ref 39–117)
BUN: 14 mg/dL (ref 6–23)
CO2: 30 mEq/L (ref 19–32)
Calcium: 10.2 mg/dL (ref 8.4–10.5)
Chloride: 103 mEq/L (ref 96–112)
Creatinine, Ser: 0.67 mg/dL (ref 0.40–1.20)
GFR: 100.01 mL/min (ref >60.00)
Glucose, Bld: 95 mg/dL (ref 70–99)
Potassium: 4 mEq/L (ref 3.5–5.1)
Sodium: 139 mEq/L (ref 135–145)
Total Bilirubin: 0.4 mg/dL (ref 0.2–1.2)
Total Protein: 7.3 g/dL (ref 6.0–8.3)

## 2021-05-16 LAB — CBC WITH DIFFERENTIAL/PLATELET
Basophils Absolute: 0 10*3/uL (ref 0.0–0.1)
Basophils Relative: 0.8 % (ref 0.0–3.0)
Eosinophils Absolute: 0.1 10*3/uL (ref 0.0–0.7)
Eosinophils Relative: 1.7 % (ref 0.0–5.0)
HCT: 37.3 % (ref 36.0–46.0)
Hemoglobin: 12.6 g/dL (ref 12.0–15.0)
Lymphocytes Relative: 17.9 % (ref 12.0–46.0)
Lymphs Abs: 0.9 10*3/uL (ref 0.7–4.0)
MCHC: 33.7 g/dL (ref 30.0–36.0)
MCV: 85.6 fl (ref 78.0–100.0)
Monocytes Absolute: 0.5 10*3/uL (ref 0.1–1.0)
Monocytes Relative: 10.3 % (ref 3.0–12.0)
Neutro Abs: 3.6 10*3/uL (ref 1.4–7.7)
Neutrophils Relative %: 69.3 % (ref 43.0–77.0)
Platelets: 282 10*3/uL (ref 150.0–400.0)
RBC: 4.36 Mil/uL (ref 3.87–5.11)
RDW: 13.5 % (ref 11.5–15.5)
WBC: 5.2 10*3/uL (ref 4.0–10.5)

## 2021-05-16 NOTE — Patient Instructions (Signed)
Sarcoidosis with mediastinal lymph node involvement - Dx in 05/2021 via [facility] of left axillary node - No indication for prednisone therapy - No indication for pulmonary function test at this time - REFER for annual ophthalmology exam - EKG  reviewed - ORDER QuantiFERON-TB  - ORDER CBC, CMP  - ORDER CT Chest WITH contrast in October 2022  Follow-up with me after CT Chest

## 2021-05-16 NOTE — Progress Notes (Signed)
Subjective:   PATIENT ID: [name] GENDER: female DOB: [birth year], MRN: [id number]   HPI  Chief Complaint  Patient presents with  . Follow-up    Sharp pains for past few months to right side of chest. Diagnosed with sarcoidosis in April.     Reason for Visit: New consult for sarcoid  [name] is a 55 year old female with stage IB breast cancer s/p double mastectomy, chemotherapy and radiation in 2020. She recently had PET on 03/29/21 with multiple hypermetabolic mediastinal, internal mammary and left axillary lymph nodes concerning for metastatic disease as well as solitary hypermetabolic RLL lung nodule. Lymph node biopsy was performed on 04/10/21 demonstrating granulomatous inflammation consistent with sarcoidosis. She was referred by her Oncologist, Dr. Burr Medico for further management.  She reports right sided chest pain that often occurs intermittently in the last 3-4 sharp abrupt pain lasting < 2 seconds. Only lasted for a minute rarely. Denies shortness of breath, cough or wheezing. Reports allergies and congestion that is helped with saline. Denies skin rashes. She has occasional constipation requiring stool softeners every 3-4 days. Denies urinary issues. She reports reading glasses but does not routinely see an eye doctor.  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  Past Medical History:  Diagnosis Date  . Anemia   . Anxiety   . Arthritis   . Cancer [facility])    right breast  . Family history of adverse reaction to anesthesia    sister has same issues  . GERD (gastroesophageal reflux disease)   . Neuromuscular disorder (HCC)    neuropathy  . PONV (postoperative nausea and vomiting)      Family History  Problem Relation Age of Onset  . Brain cancer  Father   . Cancer Maternal Uncle        melanoma   . Cancer Paternal Uncle        gastric cancer  . Spinal muscular atrophy Daughter   . Spinal muscular atrophy Child   . Breast cancer Neg Hx   . Colon cancer Neg Hx   . Esophageal cancer Neg Hx   . Stomach cancer Neg Hx   . Rectal cancer Neg Hx      Social History   Occupational History  . Not on file  Tobacco Use  . Smoking status: Never Smoker  . Smokeless tobacco: Never Used  Vaping Use  . Vaping Use: Never used  Substance and Sexual Activity  . Alcohol use: Yes    Comment: occasional  . Drug use: No  . Sexual activity: Not on file    Allergies  Allergen Reactions  . Codeine Nausea And Vomiting  . Tramadol Nausea And Vomiting     Outpatient Medications Prior to Visit  Medication Sig Dispense Refill  . Cholecalciferol (VITAMIN D3) 125 MCG (5000 UT) CAPS Take 5,000 Units by mouth daily.    Marland Kitchen ibuprofen (ADVIL,MOTRIN) 200 MG tablet Take 600 mg by mouth every 6 (six) hours as needed for moderate pain.     Marland Kitchen LORazepam (ATIVAN) 1 MG tablet Take 1 tablet (1 mg total) by mouth once as needed for up to 1 dose for anxiety (before MRI). Take 1 tab 30-60 mins before MRI, OK to take second one before MRI if needed 2 tablet 0  . Multiple Vitamin (MULTIVITAMIN WITH MINERALS) TABS tablet  Take 1 tablet by mouth daily.    Marland Kitchen omeprazole (PRILOSEC) 20 MG capsule Take 20 mg by mouth daily.    . potassium chloride SA (KLOR-CON M20) 20 MEQ tablet TAKE 1 TABLET ONCE DAILY ON MONDAYS WEDNESDAYS AND FRIDAYS 45 tablet 1  . venlafaxine XR (EFFEXOR-XR) 75 MG 24 hr capsule TAKE 1 CAPSULE BY MOUTH DAILY WITH BREAKFAST. 90 capsule 1  . zolpidem (AMBIEN) 5 MG tablet Take 1 tablet (5 mg total) by mouth at bedtime as needed for sleep. 30 tablet 0   No facility-administered medications prior to visit.    Review of Systems  Constitutional: Negative for chills, diaphoresis, fever, malaise/fatigue and weight loss.  HENT: Negative for congestion, ear  pain and sore throat.   Respiratory: Negative for cough, hemoptysis, sputum production, shortness of breath and wheezing.   Cardiovascular: Positive for chest pain. Negative for palpitations and leg swelling.  Gastrointestinal: Positive for constipation. Negative for abdominal pain, heartburn and nausea.  Genitourinary: Negative for frequency.  Musculoskeletal: Negative for joint pain and myalgias.  Skin: Negative for itching and rash.  Neurological: Negative for dizziness, weakness and headaches.  Endo/Heme/Allergies: Does not bruise/bleed easily.  Psychiatric/Behavioral: Negative for depression. The patient is not nervous/anxious.      Objective:   Vitals:   05/16/21 0900  BP: 110/68  Pulse: 79  Temp: 98.4 F (36.9 C)  TempSrc: Oral  SpO2: 98%  Weight: 171 lb 3.2 oz (77.7 kg)  Height: 5' 2.5" (1.588 m)     Physical Exam: General: Well-appearing, no acute distress HENT: [place], AT Eyes: EOMI, no scleral icterus Respiratory: Clear to auscultation bilaterally.  No crackles, wheezing or rales Cardiovascular: RRR, -M/R/G, no JVD Extremities:-Edema,-tenderness Neuro: AAO x4, CNII-XII grossly intact Musculoskeletal: Tenderness to palpation along the anterior 5th-6th rib space Skin: Intact, no rashes or bruising Psych: Normal mood, normal affect  Data Reviewed:  Imaging: CT Chest 03/05/21 - No mediastinal or hilar adenopathy on non-contrasted study. Scattered lymph nodes <77m.  PET 03/29/21 - Multiple hypermetabolic mediastinal internal mammary lymph nodes bilaterally and left single left axillary node measuring 851m PFT: None on file  Labs: CBC    Component Value Date/Time   WBC 4.4 01/04/2021 1044   WBC 6.1 05/14/2020 1725   RBC 4.27 01/04/2021 1044   HGB 12.1 01/04/2021 1044   HCT 37.9 01/04/2021 1044   PLT 302 01/04/2021 1044   MCV 88.8 01/04/2021 1044   MCH 28.3 01/04/2021 1044   MCHC 31.9 01/04/2021 1044   RDW 12.6 01/04/2021 1044   LYMPHSABS 1.1 01/04/2021  1044   MONOABS 0.5 01/04/2021 1044   EOSABS 0.2 01/04/2021 1044   BASOSABS 0.1 01/04/2021 1044   CMP Latest Ref Rng & Units 01/04/2021 10/10/2020 05/14/2020  Glucose 70 - 99 mg/dL 73 98 85  BUN 6 - 20 mg/dL '10 8 10  ' Creatinine 0.44 - 1.00 mg/dL 0.74 0.77 0.67  Sodium 135 - 145 mmol/L 142 142 141  Potassium 3.5 - 5.1 mmol/L 3.5 4.0 3.5  Chloride 98 - 111 mmol/L 109 108 105  CO2 22 - 32 mmol/L '25 26 22  ' Calcium 8.9 - 10.3 mg/dL 9.6 10.1 9.8  Total Protein 6.5 - 8.1 g/dL 7.2 7.1 7.7  Total Bilirubin 0.3 - 1.2 mg/dL 0.4 0.3 0.5  Alkaline Phos 38 - 126 U/L 101 108 93  AST 15 - 41 U/L '17 17 23  ' ALT 0 - 44 U/L '14 19 17    ' Path: Lymph node, needle/core biopsy, left axillary node -  GRANULOMATOUS INFLAMMATION. Microscopic Comment Granulomatous inflammation is epithelioid in nature. The differential diagnosis includes sarcoidosis. Clinical correlation is recommended. Special stains AFB (acid-fast bacillus) and GMS (fungus) will be reported separately. Dr. Melina Copa reviewed the case. ADDENDUM: Special stains AFB (acid-fast bacillus) and GMS (fungus) are negative.     Assessment & Plan:   Discussion: 55 year old year old female with history of breast cancer who presents as consult for sarcoid. PET CT with mildly enlarged mediastinal, mammary, axillary and RLL lymph nodes and activity. No respiratory symptoms. L axillary node biopsied confirming granulomatous inflammation. We discussed surveillance vs invasive testing. After discussion plan for CT Chest with contrast in 6 months. Will start work-up to rule out extrapulmonary involvement of sarcoidosis  Sarcoidosis with mediastinal lymph node involvement - Dx in 05/2021 via TNA of left axillary node - No indication for prednisone therapy - No indication for pulmonary function test at this time - REFER for annual ophthalmology exam - EKG  reviewed. No evidence of conduction abnormalities.  - ORDER QuantiFERON-TB  - ORDER CBC, CMP   PET Avid-  mediastinal adenopathy and RLL lung nodule measuring 2m - ORDER CT Chest with contrast in October 2022  Right sided chest pain Previously the site of her surgical drain site. Likely related to scarring - Recommend conservative management with OTC meds, warm compresses and stretching. Advised discussing pain management with PCP or oncology if pain persists.  Health Maintenance Immunization History  Administered Date(s) Administered  . Influenza,inj,Quad PF,6+ Mos 09/28/2019   CT Lung Screen - not indicated  Orders Placed This Encounter  Procedures  . CT Chest W Contrast    Please schedule for October 2022    Standing Status:   Future    Standing Expiration Date:   05/16/2022    Order Specific Question:   If indicated for the ordered procedure, I authorize the administration of contrast media per Radiology protocol    Answer:   Yes    Order Specific Question:   Is patient pregnant?    Answer:   No    Order Specific Question:   Preferred imaging location?    Answer:   [place] [facility] - [address]  . QuantiFERON-TB Gold Plus  . CBC w/Diff    Standing Status:   Future    Standing Expiration Date:   05/16/2022  . Comp Met (CMET)    Standing Status:   Future    Standing Expiration Date:   05/16/2022  . Ambulatory referral to Ophthalmology    Referral Priority:   Routine    Referral Type:   Consultation    Referral Reason:   Specialty Services Required    Requested Specialty:   Ophthalmology    Number of Visits Requested:   1  . EKG 12-Lead  No orders of the defined types were placed in this encounter.   Return in about 6 months (around 11/15/2021).  I have spent a total time of 50-minutes on the day of the appointment reviewing prior documentation, coordinating care and discussing medical diagnosis and plan with the patient/family. Imaging, labs and tests included in this note have been reviewed and interpreted independently by me.  CChatsworth MD [facility] 05/16/2021 8:46 AM  Office Number [phone]

## 2021-05-17 ENCOUNTER — Encounter: Payer: Self-pay | Admitting: Hematology

## 2021-05-17 ENCOUNTER — Other Ambulatory Visit: Payer: Self-pay | Admitting: Hematology

## 2021-05-17 MED ORDER — ZOLPIDEM TARTRATE 5 MG PO TABS: 5.0000 mg | ORAL_TABLET | Freq: Every evening | ORAL | 0 refills | Status: DC | PRN

## 2021-05-18 LAB — QUANTIFERON-TB GOLD PLUS
Mitogen-NIL: >10 IU/mL
NIL: 0.05 IU/mL
QuantiFERON-TB Gold Plus: NEGATIVE
TB1-NIL: 0 IU/mL
TB2-NIL: 0 IU/mL

## 2021-06-04 ENCOUNTER — Other Ambulatory Visit: Payer: BC Managed Care – PPO

## 2021-06-06 ENCOUNTER — Ambulatory Visit: Payer: BC Managed Care – PPO | Admitting: Hematology

## 2021-06-11 ENCOUNTER — Other Ambulatory Visit: Payer: Self-pay | Admitting: Hematology

## 2021-07-04 ENCOUNTER — Encounter: Payer: Self-pay | Admitting: Hematology

## 2021-07-10 ENCOUNTER — Encounter: Payer: Self-pay | Admitting: Hematology

## 2021-07-10 NOTE — Progress Notes (Deleted)
[place]   [phone] [phone]   Clinic Follow up Note   Patient Care Team: Merwyn Katos as PCP - General (Physician Assistant) Jovita Kussmaul, MD as Consulting Physician (General Surgery) Truitt Merle, MD as Consulting Physician (Hematology) Gery Pray, MD as Consulting Physician (Radiation Oncology) Rockwell Germany, RN as Oncology Nurse Navigator Mauro Kaufmann, RN as Oncology Nurse Navigator Alla Feeling, NP as Nurse Practitioner (Nurse Practitioner) 07/10/2021  CHIEF COMPLAINT: Follow-up breast cancer  SUMMARY OF ONCOLOGIC HISTORY: Oncology History Overview Note  Cancer Staging Malignant neoplasm of upper-outer quadrant of right breast in female, estrogen receptor positive ([place]) Staging form: Breast, AJCC 8th Edition - Clinical stage from 01/15/2019: Stage IA (cT1b, cN0, cM0, G1, ER+, PR-, HER2-) - Signed by Truitt Merle, MD on 01/26/2019 - Pathologic stage from 02/24/2019: Stage IB (pT1a, pN1a, cM0, G1, ER+, PR-, HER2-) - Signed by Truitt Merle, MD on 03/18/2019     Malignant neoplasm of upper-outer quadrant of right breast in female, estrogen receptor positive ([place])  01/15/2019 Cancer Staging   Staging form: Breast, AJCC 8th Edition - Clinical stage from 01/15/2019: Stage IA (cT1b, cN0, cM0, G1, ER+, PR-, HER2-) - Signed by Truitt Merle, MD on 01/26/2019    01/15/2019 Mammogram   Diagnostic Mammogram 01/15/19  IMPRESSION Targeted ultrasound is performed, showing right breast 10 o'clock 5 cm from the nipple hypoechoic round mass measuring 0.7 x 0.5 x 0.6 cm. Adjacent to it there is a probable complicated cyst measuring 4 mm. There is no evidence of right axillary lymphadenopathy.    01/15/2019 Initial Biopsy   Diagnosis 01/15/19  Breast, right, needle core biopsy, upper outer - INVASIVE AND IN SITU MAMMARY CARCINOMA WITH CALCIFICATIONS. - SEE COMMENT. 1 of 3 FINAL for Salce, [place] P ([id number]) Microscopic Comment The  carcinoma appears grade I. The malignant cells are positive for cytokeratin AE1/AE3 and negative for E-cadherin. A breast prognostic profile will be performed and the results reported separately. Dr Vicente Males has reviewed the case and concurs with this interpretation. The results are called to [facility] on 01/18/2019. (JBK:ah:ecj 01/18/19)    01/15/2019 Receptors her2   PROGNOSTIC INDICATORS Results: IMMUNOHISTOCHEMICAL AND MORPHOMETRIC ANALYSIS PERFORMED MANUALLY The tumor cells are NEGATIVE for Her2 (0). Estrogen Receptor: 10%, POSITIVE, MODERATE STAINING INTENSITY Progesterone Receptor: 0%, NEGATIVE Proliferation Marker Ki67: <1%    01/21/2019 Initial Diagnosis   Malignant neoplasm of upper-outer quadrant of right breast in female, estrogen receptor positive ([place])    01/29/2019 Breast MRI   IMPRESSION: 1. The biopsy-proven site of malignancy in the upper-outer quadrant of the right breast resides centrally with in an area of suspicious non mass enhancement spanning 6-7 cm.   2. There is a 6 mm suspicious enhancing mass in the lower-inner quadrant of the right breast, posterior depth.   3. There is a 9 mm indeterminate enhancing mass in the lower outer quadrant of the right breast.   4.  No evidence of malignancy in the left breast.    02/24/2019 Surgery   RIGHT MASTECTOMY WITH SENTINEL LYMPH NODE MAPPING AND LEFT PROPHYLACTIC MASTECTOMY by Dr. Marlou Starks  BREAST RECONSTRUCTION WITH PLACEMENT OF TISSUE EXPANDER by Dr. Marla Roe  02/24/19    02/24/2019 Pathology Results   Diagnosis 1. Breast, simple mastectomy, Left - BENIGN BREAST PARENCHYMA WITH FIBROCYSTIC CHANGE. - NEGATIVE FOR CARCINOMA. 2. Breast, simple mastectomy, Right - LOBULAR CARCINOMA IN SITU, INTERMEDIATE TO HIGH NUCLEAR GRADE. SEE NOTE. - NO EVIDENCE OF  RESIDUAL INVASIVE CARCINOMA. - RESECTION MARGINS ARE NEGATIVE FOR IN SITU OR INVASIVE CARCINOMA. - BIOPSY SITE CHANGES. - SEE ONCOLOGY  TABLE. 3. Lymph node, sentinel, biopsy, Right #1 - IMMUNOHISTOCHEMICAL STAIN FOR AE1/AE3 IS NEGATIVE FOR CARCINOMA (0/1). 4. Lymph node, sentinel, biopsy, Right - IMMUNOHISTOCHEMICAL STAIN FOR AE1/AE3 IS NEGATIVE FOR CARCINOMA (0/1). 5. Lymph node, sentinel, biopsy, Right #2 - IMMUNOHISTOCHEMICAL STAIN FOR AE1/AE3 IS NEGATIVE FOR CARCINOMA (0/1). 6. Lymph node, sentinel, biopsy, Right - IMMUNOHISTOCHEMICAL STAIN FOR AE1/AE3 IS NEGATIVE FOR CARCINOMA (0/1). 7. Lymph node, sentinel, biopsy, Right - METASTATIC CARCINOMA TO ONE LYMPH NODE (1/1) (CONFIRMED WITH IMMUNOHISTOCHEMICAL STAIN FOR AE1/AE3) - FOCUS OF METASTATIC CARCINOMA MEASURES JUST OVER 0.2 CM IN GREATEST DIMENSION - NO EVIDENCE OF EXTRANODAL EXTENSION    02/24/2019 Cancer Staging   Staging form: Breast, AJCC 8th Edition - Pathologic stage from 02/24/2019: Stage IB (pT1a, pN1a, cM0, G1, ER+, PR-, HER2-) - Signed by Truitt Merle, MD on 03/18/2019    04/01/2019 - 06/03/2019 Chemotherapy   adjuvant Taxol and Carboplatin every 3 weeks for 4 cycles starting in 2 weeks starting 04/01/19-06/03/19   06/2019 - 03/2020 Anti-estrogen oral therapy   Anastrozole 88m daily starting 06/2019, held after 2 weeks due to hot flashes and joint pain and RT. Switched to Exemestane in 10/2019. Switched to Exemestane in 10/2019, ultimately changed to letrozole in 01/2020 due to worsening joint pain. Ultimately stopped anti-estrogen therapies in 03/2020 due to arthralgias    06/30/2019 Surgery   REMOVAL OF BILATERAL TISSUE EXPANDERS WITH PLACEMENT OF BILATERAL BREAST IMPLANTS by Dr DMarla Roe 06/30/19    08/17/2019 - 09/28/2019 Radiation Therapy   Adjuvant Radiaiton with Dr KSondra Come Site Technique Total Dose (Gy) Dose per Fx (Gy) Completed Fx Beam Energies  Thorax: CW_Rt_Bst_boost Electron 10/10 2 5/5 6E  Thorax: CW_Rt 3D 50/50 2 25/25 10X, 15X, 6X  Thorax: CW_Rt_axilla Complex 50/50 2 25/25 10X    12/23/2019 Survivorship   SCP delivered by LCira Rue NP     06/02/2020 Imaging   MRI brain with and without contrast IMPRESSION: 1. No acute intracranial abnormality or evidence of metastatic disease. 2. Mild cerebral white matter T2 signal changes, nonspecific though may reflect chronic small vessel ischemic disease, migraines, or prior infection/inflammation.   06/06/2020 Imaging   Bone scan IMPRESSION: No definite scintigraphic evidence of osseous metastases. Mildly abnormal uptake is noted in both knees and feet most consistent with degenerative change.   03/05/2021 Imaging   CT Chest  IMPRESSION: 1. Resolution of ground-glass in the RIGHT upper lobe that was seen on the previous imaging. Compatible with resolution of inflammation. 2. Tiny basilar nodules at the LEFT lung base unchanged since May of 2021 and seen in hind site on the study of May of 2020, largest 3 mm in the LEFT lower lobe. These are likely benign. 3. Slowly enlarging LEFT internal mammary lymph nodes (less than a cm but increasing in size) the with scattered small lymph nodes throughout the chest. Presence of internal mammary lymph nodes is suspicious given patient history. Could consider short interval follow-up in 3 months or PET imaging for further assessment. Smaller internal mammary lymph nodes are present on the RIGHT. 4. 3 mm calculus in the upper pole the RIGHT kidney. 5. Aortic atherosclerosis.   Aortic Atherosclerosis (ICD10-I70.0).   03/29/2021 PET scan   IMPRESSION: 1. There are multiple hypermetabolic mediastinal, internal mammary and left axillary lymph nodes as described. The intensity of the hypermetabolic activity and distribution of the lymph nodes are suspicious  for metastatic disease in this patient with a history of breast cancer. 2. Solitary hypermetabolic right lower lobe pulmonary nodule, also suspicious for metastasis. 3. Indeterminate small foci of hypermetabolic activity in the left thoracic paraspinal soft tissues. 4. No evidence of  metastatic disease in the abdomen, pelvis or bones. 5. Bilateral nephrolithiasis.   04/10/2021 Imaging   [place] Left Axillary  IMPRESSION: Suspicious left axillary lymph node.   RECOMMENDATION: Ultrasound-guided core needle biopsy suspicious left axillary lymph node.   04/10/2021 Pathology Results   Diagnosis Lymph node, needle/core biopsy, left axillary node - GRANULOMATOUS INFLAMMATION. Microscopic Comment Granulomatous inflammation is epithelioid in nature. The differential diagnosis includes sarcoidosis. Clinical correlation is recommended. Special stains AFB (acid-fast bacillus) and GMS (fungus) will be reported separately. Dr. Melina Copa reviewed the case     CURRENT THERAPY: Surveillance  INTERVAL HISTORY: Ms. Rezabek returns for follow-up as scheduled.  She was last seen by Dr. Burr Medico 04/12/2021. She was seen by pulmonology Dr. Loanne Drilling 05/16/21, the plan is to repeat CT chest in October.    REVIEW OF SYSTEMS:   Constitutional: Denies fevers, chills or abnormal weight loss Eyes: Denies blurriness of vision Ears, nose, mouth, throat, and face: Denies mucositis or sore throat Respiratory: Denies cough, dyspnea or wheezes Cardiovascular: Denies palpitation, chest discomfort or lower extremity swelling Gastrointestinal:  Denies nausea, heartburn or change in bowel habits Skin: Denies abnormal skin rashes Lymphatics: Denies new lymphadenopathy or easy bruising Neurological:Denies numbness, tingling or new weaknesses Behavioral/Psych: Mood is stable, no new changes  All other systems were reviewed with the patient and are negative.  MEDICAL HISTORY:  Past Medical History:  Diagnosis Date   Anemia    Anxiety    Arthritis    Cancer ([place])    right breast   Family history of adverse reaction to anesthesia    sister has same issues   GERD (gastroesophageal reflux disease)    Neuromuscular disorder (HCC)    neuropathy   PONV (postoperative nausea and vomiting)     SURGICAL  HISTORY: Past Surgical History:  Procedure Laterality Date   BREAST RECONSTRUCTION WITH PLACEMENT OF TISSUE EXPANDER AND FLEX HD (ACELLULAR HYDRATED DERMIS) Bilateral 02/24/2019   Procedure: BREAST RECONSTRUCTION WITH PLACEMENT OF TISSUE EXPANDER AND FLEX HD (ACELLULAR HYDRATED DERMIS);  Surgeon: Wallace Going, DO;  Location: [facility];  Service: Plastics;  Laterality: Bilateral;   BREAST SURGERY     left breast bx   ESOPHAGOGASTRODUODENOSCOPY (EGD) WITH ESOPHAGEAL DILATION     EYE SURGERY     dart to the eye  (right)   FACIAL FRACTURE SURGERY     FRACTURE SURGERY     KNEE SURGERY     MASTECTOMY W/ SENTINEL NODE BIOPSY Bilateral 02/24/2019   Procedure: RIGHT MASTECTOMY WITH SENTINEL LYMPH NODE MAPPING AND LEFT PROPHYLACTIC MASTECTOMY;  Surgeon: Jovita Kussmaul, MD;  Location: [facility];  Service: General;  Laterality: Bilateral;   REMOVAL OF BILATERAL TISSUE EXPANDERS WITH PLACEMENT OF BILATERAL BREAST IMPLANTS Bilateral 06/30/2019   Procedure: REMOVAL OF BILATERAL TISSUE EXPANDERS WITH PLACEMENT OF BILATERAL BREAST IMPLANTS;  Surgeon: Wallace Going, DO;  Location: [facility];  Service: Plastics;  Laterality: Bilateral;   SHOULDER SURGERY      I have reviewed the social history and family history with the patient and they are unchanged from previous note.  ALLERGIES:  is allergic to codeine and tramadol.  MEDICATIONS:  Current Outpatient Medications  Medication Sig Dispense Refill   Cholecalciferol (VITAMIN D3) 125 MCG (5000 UT)  CAPS Take 5,000 Units by mouth daily.     ibuprofen (ADVIL,MOTRIN) 200 MG tablet Take 600 mg by mouth every 6 (six) hours as needed for moderate pain.      LORazepam (ATIVAN) 1 MG tablet Take 1 tablet (1 mg total) by mouth once as needed for up to 1 dose for anxiety (before MRI). Take 1 tab 30-60 mins before MRI, OK to take second one before MRI if needed 2 tablet 0   Multiple Vitamin (MULTIVITAMIN WITH MINERALS) TABS tablet Take 1 tablet by  mouth daily.     omeprazole (PRILOSEC) 20 MG capsule Take 20 mg by mouth daily.     potassium chloride SA (KLOR-CON M20) 20 MEQ tablet TAKE 1 TABLET ONCE DAILY ON MONDAYS WEDNESDAYS AND FRIDAYS (Patient not taking: Reported on 05/16/2021) 45 tablet 1   venlafaxine XR (EFFEXOR-XR) 75 MG 24 hr capsule TAKE 1 CAPSULE BY MOUTH DAILY WITH BREAKFAST. 90 capsule 1   zolpidem (AMBIEN) 5 MG tablet Take 1 tablet (5 mg total) by mouth at bedtime as needed for sleep. 30 tablet 0   No current facility-administered medications for this visit.    PHYSICAL EXAMINATION: ECOG PERFORMANCE STATUS: {CHL ONC ECOG PS:[id number]}  There were no vitals filed for this visit. There were no vitals filed for this visit.  GENERAL:alert, no distress and comfortable SKIN: skin color, texture, turgor are normal, no rashes or significant lesions EYES: normal, Conjunctiva are pink and non-injected, sclera clear OROPHARYNX:no exudate, no erythema and lips, buccal mucosa, and tongue normal  NECK: supple, thyroid normal size, non-tender, without nodularity LYMPH:  no palpable lymphadenopathy in the cervical, axillary or inguinal LUNGS: clear to auscultation and percussion with normal breathing effort HEART: regular rate & rhythm and no murmurs and no lower extremity edema ABDOMEN:abdomen soft, non-tender and normal bowel sounds Musculoskeletal:no cyanosis of digits and no clubbing  NEURO: alert & oriented x 3 with fluent speech, no focal motor/sensory deficits  LABORATORY DATA:  I have reviewed the data as listed CBC Latest Ref Rng & Units 05/16/2021 01/04/2021 10/10/2020  WBC 4.0 - 10.5 K/uL 5.2 4.4 5.8  Hemoglobin 12.0 - 15.0 g/dL 12.6 12.1 11.8(L)  Hematocrit 36.0 - 46.0 % 37.3 37.9 37.1  Platelets 150.0 - 400.0 K/uL 282.0 302 266     CMP Latest Ref Rng & Units 05/16/2021 01/04/2021 10/10/2020  Glucose 70 - 99 mg/dL 95 73 98  BUN 6 - 23 mg/dL _0 Creatinine 0.40 - 1.20 mg/dL 0.67 0.74 0.77  Sodium 135 - 145 mEq/L  139 142 142  Potassium 3.5 - 5.1 mEq/L 4.0 3.5 4.0  Chloride 96 - 112 mEq/L 103 109 108  CO2 19 - 32 mEq/L _1 Calcium 8.4 - 10.5 mg/dL 10.2 9.6 10.1  Total Protein 6.0 - 8.3 g/dL 7.3 7.2 7.1  Total Bilirubin 0.2 - 1.2 mg/dL 0.4 0.4 0.3  Alkaline Phos 39 - 117 U/L 87 101 108  AST 0 - 37 U/L _2 ALT 0 - 35 U/L _3 RADIOGRAPHIC STUDIES: I have personally reviewed the radiological images as listed and agreed with the findings in the report. No results found.   ASSESSMENT & PLAN:  No problem-specific Assessment & Plan notes found for this encounter.   No orders of the defined types were placed in this encounter.  All questions were answered. The patient knows to call the clinic with any problems, questions or concerns. No barriers to  learning was detected. I spent {CHL ONC TIME VISIT - RMBOB:[id number]} counseling the patient face to face. The total time spent in the appointment was {CHL ONC TIME VISIT - UNHRV:[id number]} and more than 50% was on counseling and review of test results     Alla Feeling, NP 07/10/21

## 2021-07-11 ENCOUNTER — Other Ambulatory Visit: Payer: BC Managed Care – PPO

## 2021-07-11 ENCOUNTER — Ambulatory Visit: Payer: BC Managed Care – PPO | Admitting: Nurse Practitioner

## 2021-07-11 ENCOUNTER — Telehealth: Payer: Self-pay | Admitting: Nurse Practitioner

## 2021-07-11 ENCOUNTER — Encounter: Payer: Self-pay | Admitting: Hematology

## 2021-07-11 NOTE — Telephone Encounter (Signed)
Scheduled appts per 7/27 sch msg. Pt aware.  

## 2021-07-31 NOTE — Progress Notes (Signed)
[place]   [phone] [phone]   Clinic Follow up Note   Patient Care Team: Merwyn Katos as PCP - General (Physician Assistant) Jovita Kussmaul, MD as Consulting Physician (General Surgery) Truitt Merle, MD as Consulting Physician (Hematology) Gery Pray, MD as Consulting Physician (Radiation Oncology) Rockwell Germany, RN as Oncology Nurse Navigator Mauro Kaufmann, RN as Oncology Nurse Navigator Alla Feeling, NP as Nurse Practitioner (Nurse Practitioner) 08/01/2021  CHIEF COMPLAINT: Follow up right breast cancer   SUMMARY OF ONCOLOGIC HISTORY: Oncology History Overview Note  Cancer Staging Malignant neoplasm of upper-outer quadrant of right breast in female, estrogen receptor positive ([place]) Staging form: Breast, AJCC 8th Edition - Clinical stage from 01/15/2019: Stage IA (cT1b, cN0, cM0, G1, ER+, PR-, HER2-) - Signed by Truitt Merle, MD on 01/26/2019 - Pathologic stage from 02/24/2019: Stage IB (pT1a, pN1a, cM0, G1, ER+, PR-, HER2-) - Signed by Truitt Merle, MD on 03/18/2019     Malignant neoplasm of upper-outer quadrant of right breast in female, estrogen receptor positive ([place])  01/15/2019 Cancer Staging   Staging form: Breast, AJCC 8th Edition - Clinical stage from 01/15/2019: Stage IA (cT1b, cN0, cM0, G1, ER+, PR-, HER2-) - Signed by Truitt Merle, MD on 01/26/2019   01/15/2019 Mammogram   Diagnostic Mammogram 01/15/19  IMPRESSION Targeted ultrasound is performed, showing right breast 10 o'clock 5 cm from the nipple hypoechoic round mass measuring 0.7 x 0.5 x 0.6 cm. Adjacent to it there is a probable complicated cyst measuring 4 mm. There is no evidence of right axillary lymphadenopathy.   01/15/2019 Initial Biopsy   Diagnosis 01/15/19  Breast, right, needle core biopsy, upper outer - INVASIVE AND IN SITU MAMMARY CARCINOMA WITH CALCIFICATIONS. - SEE COMMENT. 1 of 3 FINAL for [name] ([id number]) Microscopic  Comment The carcinoma appears grade I. The malignant cells are positive for cytokeratin AE1/AE3 and negative for E-cadherin. A breast prognostic profile will be performed and the results reported separately. Dr Vicente Males has reviewed the case and concurs with this interpretation. The results are called to [facility] on 01/18/2019. (JBK:ah:ecj 01/18/19)   01/15/2019 Receptors her2   PROGNOSTIC INDICATORS Results: IMMUNOHISTOCHEMICAL AND MORPHOMETRIC ANALYSIS PERFORMED MANUALLY The tumor cells are NEGATIVE for Her2 (0). Estrogen Receptor: 10%, POSITIVE, MODERATE STAINING INTENSITY Progesterone Receptor: 0%, NEGATIVE Proliferation Marker Ki67: <1%   01/21/2019 Initial Diagnosis   Malignant neoplasm of upper-outer quadrant of right breast in female, estrogen receptor positive ([place])   01/29/2019 Breast MRI   IMPRESSION: 1. The biopsy-proven site of malignancy in the upper-outer quadrant of the right breast resides centrally with in an area of suspicious non mass enhancement spanning 6-7 cm.   2. There is a 6 mm suspicious enhancing mass in the lower-inner quadrant of the right breast, posterior depth.   3. There is a 9 mm indeterminate enhancing mass in the lower outer quadrant of the right breast.   4.  No evidence of malignancy in the left breast.   02/24/2019 Surgery   RIGHT MASTECTOMY WITH SENTINEL LYMPH NODE MAPPING AND LEFT PROPHYLACTIC MASTECTOMY by Dr. Marlou Starks  BREAST RECONSTRUCTION WITH PLACEMENT OF TISSUE EXPANDER by Dr. Marla Roe  02/24/19    02/24/2019 Pathology Results   Diagnosis 1. Breast, simple mastectomy, Left - BENIGN BREAST PARENCHYMA WITH FIBROCYSTIC CHANGE. - NEGATIVE FOR CARCINOMA. 2. Breast, simple mastectomy, Right - LOBULAR CARCINOMA IN SITU, INTERMEDIATE TO HIGH NUCLEAR GRADE. SEE NOTE. - NO EVIDENCE OF RESIDUAL INVASIVE CARCINOMA. -  RESECTION MARGINS ARE NEGATIVE FOR IN SITU OR INVASIVE CARCINOMA. - BIOPSY SITE CHANGES. - SEE ONCOLOGY  TABLE. 3. Lymph node, sentinel, biopsy, Right #1 - IMMUNOHISTOCHEMICAL STAIN FOR AE1/AE3 IS NEGATIVE FOR CARCINOMA (0/1). 4. Lymph node, sentinel, biopsy, Right - IMMUNOHISTOCHEMICAL STAIN FOR AE1/AE3 IS NEGATIVE FOR CARCINOMA (0/1). 5. Lymph node, sentinel, biopsy, Right #2 - IMMUNOHISTOCHEMICAL STAIN FOR AE1/AE3 IS NEGATIVE FOR CARCINOMA (0/1). 6. Lymph node, sentinel, biopsy, Right - IMMUNOHISTOCHEMICAL STAIN FOR AE1/AE3 IS NEGATIVE FOR CARCINOMA (0/1). 7. Lymph node, sentinel, biopsy, Right - METASTATIC CARCINOMA TO ONE LYMPH NODE (1/1) (CONFIRMED WITH IMMUNOHISTOCHEMICAL STAIN FOR AE1/AE3) - FOCUS OF METASTATIC CARCINOMA MEASURES JUST OVER 0.2 CM IN GREATEST DIMENSION - NO EVIDENCE OF EXTRANODAL EXTENSION   02/24/2019 Cancer Staging   Staging form: Breast, AJCC 8th Edition - Pathologic stage from 02/24/2019: Stage IB (pT1a, pN1a, cM0, G1, ER+, PR-, HER2-) - Signed by Truitt Merle, MD on 03/18/2019   04/01/2019 - 06/03/2019 Chemotherapy   adjuvant Taxol and Carboplatin every 3 weeks for 4 cycles starting in 2 weeks starting 04/01/19-06/03/19   06/2019 - 03/2020 Anti-estrogen oral therapy   Anastrozole 21m daily starting 06/2019, held after 2 weeks due to hot flashes and joint pain and RT. Switched to Exemestane in 10/2019. Switched to Exemestane in 10/2019, ultimately changed to letrozole in 01/2020 due to worsening joint pain. Ultimately stopped anti-estrogen therapies in 03/2020 due to arthralgias    06/30/2019 Surgery   REMOVAL OF BILATERAL TISSUE EXPANDERS WITH PLACEMENT OF BILATERAL BREAST IMPLANTS by Dr DMarla Roe 06/30/19    08/17/2019 - 09/28/2019 Radiation Therapy   Adjuvant Radiaiton with Dr KSondra Come Site Technique Total Dose (Gy) Dose per Fx (Gy) Completed Fx Beam Energies  Thorax: CW_Rt_Bst_boost Electron 10/10 2 5/5 6E  Thorax: CW_Rt 3D 50/50 2 25/25 10X, 15X, 6X  Thorax: CW_Rt_axilla Complex 50/50 2 25/25 10X    12/23/2019 Survivorship   SCP delivered by LCira Rue NP     06/02/2020 Imaging   MRI brain with and without contrast IMPRESSION: 1. No acute intracranial abnormality or evidence of metastatic disease. 2. Mild cerebral white matter T2 signal changes, nonspecific though may reflect chronic small vessel ischemic disease, migraines, or prior infection/inflammation.   06/06/2020 Imaging   Bone scan IMPRESSION: No definite scintigraphic evidence of osseous metastases. Mildly abnormal uptake is noted in both knees and feet most consistent with degenerative change.   03/05/2021 Imaging   CT Chest  IMPRESSION: 1. Resolution of ground-glass in the RIGHT upper lobe that was seen on the previous imaging. Compatible with resolution of inflammation. 2. Tiny basilar nodules at the LEFT lung base unchanged since May of 2021 and seen in hind site on the study of May of 2020, largest 3 mm in the LEFT lower lobe. These are likely benign. 3. Slowly enlarging LEFT internal mammary lymph nodes (less than a cm but increasing in size) the with scattered small lymph nodes throughout the chest. Presence of internal mammary lymph nodes is suspicious given patient history. Could consider short interval follow-up in 3 months or PET imaging for further assessment. Smaller internal mammary lymph nodes are present on the RIGHT. 4. 3 mm calculus in the upper pole the RIGHT kidney. 5. Aortic atherosclerosis.   Aortic Atherosclerosis (ICD10-I70.0).   03/29/2021 PET scan   IMPRESSION: 1. There are multiple hypermetabolic mediastinal, internal mammary and left axillary lymph nodes as described. The intensity of the hypermetabolic activity and distribution of the lymph nodes are suspicious for metastatic disease in this patient  with a history of breast cancer. 2. Solitary hypermetabolic right lower lobe pulmonary nodule, also suspicious for metastasis. 3. Indeterminate small foci of hypermetabolic activity in the left thoracic paraspinal soft tissues. 4. No evidence of  metastatic disease in the abdomen, pelvis or bones. 5. Bilateral nephrolithiasis.   04/10/2021 Imaging   [place] Left Axillary  IMPRESSION: Suspicious left axillary lymph node.   RECOMMENDATION: Ultrasound-guided core needle biopsy suspicious left axillary lymph node.   04/10/2021 Pathology Results   Diagnosis Lymph node, needle/core biopsy, left axillary node - GRANULOMATOUS INFLAMMATION. Microscopic Comment Granulomatous inflammation is epithelioid in nature. The differential diagnosis includes sarcoidosis. Clinical correlation is recommended. Special stains AFB (acid-fast bacillus) and GMS (fungus) will be reported separately. Dr. Melina Copa reviewed the case     CURRENT THERAPY: Surveillance   INTERVAL HISTORY: [name] returns for f/up as scheduled. She was last seen virtually by Dr. Burr Medico 04/12/21. She has seen by Dr. Loanne Drilling for sarcoidosis 05/16/21.  She has occasional postprandial cough and chest pains, none currently.  She has scattered bone pain in her low back, left mid back, and inside left lower leg above the ankle that have worsened in the past month or so.  She denies known injury or strain.  These are not limiting activity or quality of life.  Energy, appetite, and weight are stable.  Denies concerns in her chest wall or implants.  Her right axilla feels "swollen" when she lies on the right side.  Her daughter is not doing well, respiratory function is declining, she is in touch with kids path.  All other systems were reviewed with the patient and are negative.  MEDICAL HISTORY:  Past Medical History:  Diagnosis Date   Anemia    Anxiety    Arthritis    Cancer ([place])    right breast   Family history of adverse reaction to anesthesia    sister has same issues   GERD (gastroesophageal reflux disease)    Neuromuscular disorder ([facility])    neuropathy   PONV (postoperative nausea and vomiting)     SURGICAL HISTORY: Past Surgical History:  Procedure Laterality Date   BREAST  RECONSTRUCTION WITH PLACEMENT OF TISSUE EXPANDER AND FLEX HD (ACELLULAR HYDRATED DERMIS) Bilateral 02/24/2019   Procedure: BREAST RECONSTRUCTION WITH PLACEMENT OF TISSUE EXPANDER AND FLEX HD (ACELLULAR HYDRATED DERMIS);  Surgeon: Wallace Going, DO;  Location: [facility];  Service: Plastics;  Laterality: Bilateral;   BREAST SURGERY     left breast bx   ESOPHAGOGASTRODUODENOSCOPY (EGD) WITH ESOPHAGEAL DILATION     EYE SURGERY     dart to the eye  (right)   FACIAL FRACTURE SURGERY     FRACTURE SURGERY     KNEE SURGERY     MASTECTOMY W/ SENTINEL NODE BIOPSY Bilateral 02/24/2019   Procedure: RIGHT MASTECTOMY WITH SENTINEL LYMPH NODE MAPPING AND LEFT PROPHYLACTIC MASTECTOMY;  Surgeon: Jovita Kussmaul, MD;  Location: [facility];  Service: General;  Laterality: Bilateral;   REMOVAL OF BILATERAL TISSUE EXPANDERS WITH PLACEMENT OF BILATERAL BREAST IMPLANTS Bilateral 06/30/2019   Procedure: REMOVAL OF BILATERAL TISSUE EXPANDERS WITH PLACEMENT OF BILATERAL BREAST IMPLANTS;  Surgeon: Wallace Going, DO;  Location: [facility];  Service: Plastics;  Laterality: Bilateral;   SHOULDER SURGERY      I have reviewed the social history and family history with the patient and they are unchanged from previous note.  ALLERGIES:  is allergic to codeine and tramadol.  MEDICATIONS:  Current Outpatient Medications  Medication Sig Dispense  Refill   Cholecalciferol (VITAMIN D3) 125 MCG (5000 UT) CAPS Take 5,000 Units by mouth daily.     ibuprofen (ADVIL,MOTRIN) 200 MG tablet Take 600 mg by mouth every 6 (six) hours as needed for moderate pain.      LORazepam (ATIVAN) 1 MG tablet Take 1 tablet (1 mg total) by mouth once as needed for up to 1 dose for anxiety (before MRI). Take 1 tab 30-60 mins before MRI, OK to take second one before MRI if needed 2 tablet 0   Multiple Vitamin (MULTIVITAMIN WITH MINERALS) TABS tablet Take 1 tablet by mouth daily.     omeprazole (PRILOSEC) 20 MG capsule Take 20 mg by mouth  daily.     potassium chloride SA (KLOR-CON M20) 20 MEQ tablet TAKE 1 TABLET ONCE DAILY ON MONDAYS WEDNESDAYS AND FRIDAYS (Patient not taking: Reported on 05/16/2021) 45 tablet 1   venlafaxine XR (EFFEXOR-XR) 75 MG 24 hr capsule TAKE 1 CAPSULE BY MOUTH DAILY WITH BREAKFAST. 90 capsule 1   zolpidem (AMBIEN) 5 MG tablet Take 1 tablet (5 mg total) by mouth at bedtime as needed for sleep. 30 tablet 0   No current facility-administered medications for this visit.    PHYSICAL EXAMINATION: ECOG PERFORMANCE STATUS: 0 - Asymptomatic  Vitals:   08/01/21 0856  BP: 129/83  Pulse: 75  Resp: 17  Temp: 98.4 F (36.9 C)  SpO2: 98%   Filed Weights   08/01/21 0856  Weight: 167 lb 8 oz (76 kg)    GENERAL:alert, no distress and comfortable SKIN: No rash EYES: sclera clear NECK: Without mass LYMPH:  no palpable cervical or supraclavicular lymphadenopathy  LUNGS: clear with normal breathing effort HEART: regular rate & rhythm, no lower extremity edema ABDOMEN:abdomen soft, non-tender and normal bowel sounds Musculoskeletal: Nonfocal NEURO: alert & oriented x 3 with fluent speech, no focal motor/sensory deficits Breast exam: S/Snyder bilateral mastectomy with implant reconstruction, incisions completely healed with minimal scar tissue.  No palpable mass or nodularity in either axilla, chest wall, or implant surface that I could appreciate  LABORATORY DATA:  I have reviewed the data as listed CBC Latest Ref Rng & Units 08/01/2021 05/16/2021 01/04/2021  WBC 4.0 - 10.5 K/uL 5.0 5.2 4.4  Hemoglobin 12.0 - 15.0 g/dL 11.9(L) 12.6 12.1  Hematocrit 36.0 - 46.0 % 36.3 37.3 37.9  Platelets 150 - 400 K/uL 288 282.0 302     CMP Latest Ref Rng & Units 08/01/2021 05/16/2021 01/04/2021  Glucose 70 - 99 mg/dL 91 95 73  BUN 6 - 20 mg/dL _0 Creatinine 0.44 - 1.00 mg/dL 0.77 0.67 0.74  Sodium 135 - 145 mmol/L 143 139 142  Potassium 3.5 - 5.1 mmol/L 3.8 4.0 3.5  Chloride 98 - 111 mmol/L 106 103 109  CO2 22 - 32  mmol/L _1 Calcium 8.9 - 10.3 mg/dL 9.9 10.2 9.6  Total Protein 6.5 - 8.1 g/dL 7.3 7.3 7.2  Total Bilirubin 0.3 - 1.2 mg/dL 0.4 0.4 0.4  Alkaline Phos 38 - 126 U/L 90 87 101  AST 15 - 41 U/L _2 ALT 0 - 44 U/L _3 RADIOGRAPHIC STUDIES: I have personally reviewed the radiological images as listed and agreed with the findings in the report. No results found.   ASSESSMENT & PLAN: 55 year old female  1. Malignant neoplasm of upper-outer quadrant of right breast, invasive lobular carcinoma, stage IA, Snyder(T1a,N1a,M0), ER 10%+, PR-, HER2- Grade I   -  Diagnosed in 12/2018. S/Snyder b/l mastectomy on 02/24/19. Pathology showed 1/5 + LNs, margins negative -She completed adjuvant chemo with TC every 3 weeks for 4 cycles.  -She underwent breast reconstruction with implant placements on 06/30/19.  S/Snyder adjuvant radiation with Dr. Randa Ngo 08/17/2019 - 09/28/2019 -She started antiestrogen therapy with Anastrozole in 06/2019.  She tolerated poorly with significant hot flash, and joint stiffness. She stopped after 2 weeks and eventually started exemestane in 10/2019 after RT. She did not tolerate exemestane well either, and ultimately changed to letrozole but ultimately discontinued altogether in 03/2020 due to poor tolerance secondary to arthralgias. Her ER is only 10% positive, ER and HER2 are negative, her breast cancer behaves more like triple negative. -on breast cancer surveillance  -On surveillance she developed headaches and bone pain, brain MRI 06/02/20 and bone scan 06/06/20 negative -continue surveillance   2.  Sarcoidosis -Work-up for chest pain showed enlarging mammary and thoracic lymph nodes, PET scan 03/29/2021 showed multiple hypermetabolic mediastinal, internal mammary, and left axillary lymph nodes, the largest was subcarinal lymph node measuring 1.5 cm.  She also had a solitary hypermetabolic 8 mm right lower lobe nodule, and indeterminate small foci of hypermetabolic activity in the  left thoracic paraspinal soft tissue -Left axillary lymph node biopsy 04/10/2021 showed granulomatous inflammation consistent with sarcoidosis -Seen by Dr. Loanne Drilling of pulmonology 05/16/2021, on observation -Tentative CT chest 09/2021  Disposition: [name] is clinically doing well.  I feel her arthralgias are unrelated to breast cancer.  Exam is benign, CBC and CMP are normal, there is no role for mammograms for her.  Overall no clinical concern for breast cancer recurrence.  Continue surveillance.   Continue health maintenance and disease prevention, age-appropriate cancer screenings and vaccines, routine follow-up with medical team, healthy diet, normal exercise, avoiding smoking, limiting alcohol.  Anticipate next CT chest in October to monitor sarcoidosis per Dr. Loanne Drilling.  She declined social work referral for her daughter's health situation.  Return for lab and follow-up in 3 months, or sooner if needed.  We reviewed signs and symptoms of recurrence.  All questions were answered. The patient knows to call the clinic with any problems, questions or concerns. No barriers to learning was detected.     Alla Feeling, NP 08/01/21

## 2021-08-01 ENCOUNTER — Inpatient Hospital Stay (HOSPITAL_BASED_OUTPATIENT_CLINIC_OR_DEPARTMENT_OTHER): Payer: BC Managed Care – PPO | Admitting: Nurse Practitioner

## 2021-08-01 ENCOUNTER — Other Ambulatory Visit: Payer: Self-pay

## 2021-08-01 ENCOUNTER — Inpatient Hospital Stay: Payer: BC Managed Care – PPO | Attending: Nurse Practitioner

## 2021-08-01 ENCOUNTER — Telehealth: Payer: Self-pay | Admitting: Hematology

## 2021-08-01 ENCOUNTER — Encounter: Payer: Self-pay | Admitting: Nurse Practitioner

## 2021-08-01 ENCOUNTER — Encounter: Payer: Self-pay | Admitting: Hematology

## 2021-08-01 VITALS — BP 129/83 | HR 75 | Temp 98.4°F | Resp 17 | Wt 167.5 lb

## 2021-08-01 DIAGNOSIS — Z170 Estrogen receptor positive status [ER+]: Secondary | ICD-10-CM | POA: Insufficient documentation

## 2021-08-01 DIAGNOSIS — N6331 Unspecified lump in axillary tail of the right breast: Secondary | ICD-10-CM | POA: Diagnosis not present

## 2021-08-01 DIAGNOSIS — Z923 Personal history of irradiation: Secondary | ICD-10-CM | POA: Insufficient documentation

## 2021-08-01 DIAGNOSIS — D861 Sarcoidosis of lymph nodes: Secondary | ICD-10-CM

## 2021-08-01 DIAGNOSIS — Z79899 Other long term (current) drug therapy: Secondary | ICD-10-CM | POA: Diagnosis not present

## 2021-08-01 DIAGNOSIS — I700 Atherosclerosis of aorta: Secondary | ICD-10-CM | POA: Diagnosis not present

## 2021-08-01 DIAGNOSIS — D869 Sarcoidosis, unspecified: Secondary | ICD-10-CM | POA: Diagnosis not present

## 2021-08-01 DIAGNOSIS — C50411 Malignant neoplasm of upper-outer quadrant of right female breast: Secondary | ICD-10-CM

## 2021-08-01 DIAGNOSIS — N200 Calculus of kidney: Secondary | ICD-10-CM | POA: Insufficient documentation

## 2021-08-01 DIAGNOSIS — Z79811 Long term (current) use of aromatase inhibitors: Secondary | ICD-10-CM | POA: Diagnosis not present

## 2021-08-01 DIAGNOSIS — K219 Gastro-esophageal reflux disease without esophagitis: Secondary | ICD-10-CM | POA: Diagnosis not present

## 2021-08-01 DIAGNOSIS — Z9221 Personal history of antineoplastic chemotherapy: Secondary | ICD-10-CM | POA: Diagnosis not present

## 2021-08-01 LAB — CBC WITH DIFFERENTIAL (CANCER CENTER ONLY)
Abs Immature Granulocytes: 0.01 10*3/uL (ref 0.00–0.07)
Basophils Absolute: 0.1 10*3/uL (ref 0.0–0.1)
Basophils Relative: 1 %
Eosinophils Absolute: 0.2 10*3/uL (ref 0.0–0.5)
Eosinophils Relative: 4 %
HCT: 36.3 % (ref 36.0–46.0)
Hemoglobin: 11.9 g/dL — ABNORMAL LOW (ref 12.0–15.0)
Immature Granulocytes: 0 %
Lymphocytes Relative: 21 %
Lymphs Abs: 1 10*3/uL (ref 0.7–4.0)
MCH: 28.6 pg (ref 26.0–34.0)
MCHC: 32.8 g/dL (ref 30.0–36.0)
MCV: 87.3 fL (ref 80.0–100.0)
Monocytes Absolute: 0.5 10*3/uL (ref 0.1–1.0)
Monocytes Relative: 10 %
Neutro Abs: 3.2 10*3/uL (ref 1.7–7.7)
Neutrophils Relative %: 64 %
Platelet Count: 288 10*3/uL (ref 150–400)
RBC: 4.16 MIL/uL (ref 3.87–5.11)
RDW: 12.9 % (ref 11.5–15.5)
WBC Count: 5 10*3/uL (ref 4.0–10.5)
nRBC: 0 % (ref 0.0–0.2)

## 2021-08-01 LAB — CMP (CANCER CENTER ONLY)
ALT: 23 U/L (ref 0–44)
AST: 19 U/L (ref 15–41)
Albumin: 3.8 g/dL (ref 3.5–5.0)
Alkaline Phosphatase: 90 U/L (ref 38–126)
Anion gap: 9 (ref 5–15)
BUN: 11 mg/dL (ref 6–20)
CO2: 28 mmol/L (ref 22–32)
Calcium: 9.9 mg/dL (ref 8.9–10.3)
Chloride: 106 mmol/L (ref 98–111)
Creatinine: 0.77 mg/dL (ref 0.44–1.00)
GFR, Estimated: >60 mL/min (ref >60)
Glucose, Bld: 91 mg/dL (ref 70–99)
Potassium: 3.8 mmol/L (ref 3.5–5.1)
Sodium: 143 mmol/L (ref 135–145)
Total Bilirubin: 0.4 mg/dL (ref 0.3–1.2)
Total Protein: 7.3 g/dL (ref 6.5–8.1)

## 2021-08-01 NOTE — Telephone Encounter (Signed)
Scheduled follow-up appointment per 8/17 los. Patient is aware.

## 2021-08-02 LAB — CANCER ANTIGEN 27.29: CA 27.29: 29.6 U/mL (ref 0.0–38.6)

## 2021-09-11 ENCOUNTER — Other Ambulatory Visit: Payer: Self-pay | Admitting: Hematology

## 2021-09-12 ENCOUNTER — Encounter: Payer: Self-pay | Admitting: Hematology

## 2021-09-21 ENCOUNTER — Inpatient Hospital Stay: Admission: RE | Admit: 2021-09-21 | Payer: Self-pay | Source: Ambulatory Visit

## 2021-09-27 ENCOUNTER — Ambulatory Visit (INDEPENDENT_AMBULATORY_CARE_PROVIDER_SITE_OTHER)
Admission: RE | Admit: 2021-09-27 | Discharge: 2021-09-27 | Disposition: A | Discharge status: Home / Self Care | Payer: Self-pay | Source: Ambulatory Visit | Attending: Pulmonary Disease | Admitting: Pulmonary Disease

## 2021-09-27 ENCOUNTER — Other Ambulatory Visit: Payer: Self-pay

## 2021-09-27 DIAGNOSIS — D869 Sarcoidosis, unspecified: Secondary | ICD-10-CM

## 2021-09-27 MED ORDER — IOHEXOL 350 MG/ML SOLN
80.0000 mL | Freq: Once | INTRAVENOUS | Status: AC | PRN
  Administered 2021-09-27: 80 mL via INTRAVENOUS

## 2021-10-08 ENCOUNTER — Telehealth: Payer: Self-pay | Admitting: Pulmonary Disease

## 2021-10-08 NOTE — Telephone Encounter (Signed)
[facility] Telephone Encounter  Called and updated patient CT Chest results. Pulmonary nodule decreased in size, likely infectious/inflammatory. Lower suspicion for malignancy. Her chest pain has improved and seems to be associated with eating.  Please schedule patient for follow-up with me in 3 months for sarcoid with PFTs prior to visit.

## 2021-10-08 NOTE — Telephone Encounter (Signed)
I have called the patient to let her know about the follow up in the office per Dr. Loanne Drilling and she is aware of the time. Nothing further needed at this time.

## 2021-10-31 ENCOUNTER — Encounter: Payer: Self-pay | Admitting: Hematology

## 2021-11-01 ENCOUNTER — Inpatient Hospital Stay: Payer: BC Managed Care – PPO | Attending: Nurse Practitioner | Admitting: Hematology

## 2021-11-01 ENCOUNTER — Inpatient Hospital Stay: Payer: BC Managed Care – PPO

## 2021-11-01 ENCOUNTER — Encounter: Payer: Self-pay | Admitting: Hematology

## 2021-11-01 DIAGNOSIS — F419 Anxiety disorder, unspecified: Secondary | ICD-10-CM | POA: Insufficient documentation

## 2021-11-01 DIAGNOSIS — K219 Gastro-esophageal reflux disease without esophagitis: Secondary | ICD-10-CM | POA: Insufficient documentation

## 2021-11-01 DIAGNOSIS — Z79811 Long term (current) use of aromatase inhibitors: Secondary | ICD-10-CM | POA: Insufficient documentation

## 2021-11-01 DIAGNOSIS — Z923 Personal history of irradiation: Secondary | ICD-10-CM | POA: Insufficient documentation

## 2021-11-01 DIAGNOSIS — Z9013 Acquired absence of bilateral breasts and nipples: Secondary | ICD-10-CM | POA: Insufficient documentation

## 2021-11-01 DIAGNOSIS — I700 Atherosclerosis of aorta: Secondary | ICD-10-CM | POA: Insufficient documentation

## 2021-11-01 DIAGNOSIS — N200 Calculus of kidney: Secondary | ICD-10-CM | POA: Insufficient documentation

## 2021-11-01 DIAGNOSIS — Z170 Estrogen receptor positive status [ER+]: Secondary | ICD-10-CM | POA: Insufficient documentation

## 2021-11-01 DIAGNOSIS — D649 Anemia, unspecified: Secondary | ICD-10-CM | POA: Insufficient documentation

## 2021-11-01 DIAGNOSIS — C50411 Malignant neoplasm of upper-outer quadrant of right female breast: Secondary | ICD-10-CM | POA: Insufficient documentation

## 2021-11-01 DIAGNOSIS — D869 Sarcoidosis, unspecified: Secondary | ICD-10-CM | POA: Insufficient documentation

## 2021-11-02 ENCOUNTER — Other Ambulatory Visit: Payer: Self-pay | Admitting: Hematology

## 2021-11-05 ENCOUNTER — Other Ambulatory Visit: Payer: Self-pay | Admitting: Hematology

## 2021-11-05 MED ORDER — ZOLPIDEM TARTRATE 5 MG PO TABS: ORAL_TABLET | ORAL | 0 refills | Status: DC

## 2021-11-07 ENCOUNTER — Encounter: Payer: Self-pay | Admitting: Hematology

## 2021-11-07 ENCOUNTER — Telehealth: Payer: Self-pay | Admitting: Hematology

## 2021-11-07 NOTE — Telephone Encounter (Signed)
Rescheduled appointment per scheduling message. Confirmed appointment with patient.

## 2021-11-12 ENCOUNTER — Encounter: Payer: Self-pay | Admitting: Hematology

## 2021-11-12 ENCOUNTER — Inpatient Hospital Stay: Payer: BC Managed Care – PPO

## 2021-11-12 ENCOUNTER — Other Ambulatory Visit: Payer: Self-pay

## 2021-11-12 ENCOUNTER — Inpatient Hospital Stay (HOSPITAL_BASED_OUTPATIENT_CLINIC_OR_DEPARTMENT_OTHER): Payer: BC Managed Care – PPO | Admitting: Hematology

## 2021-11-12 VITALS — BP 131/87 | HR 72 | Temp 98.5°F | Resp 18 | Ht 62.5 in | Wt 170.1 lb

## 2021-11-12 DIAGNOSIS — Z170 Estrogen receptor positive status [ER+]: Secondary | ICD-10-CM

## 2021-11-12 DIAGNOSIS — D649 Anemia, unspecified: Secondary | ICD-10-CM | POA: Diagnosis not present

## 2021-11-12 DIAGNOSIS — C50411 Malignant neoplasm of upper-outer quadrant of right female breast: Secondary | ICD-10-CM | POA: Diagnosis not present

## 2021-11-12 DIAGNOSIS — I700 Atherosclerosis of aorta: Secondary | ICD-10-CM | POA: Diagnosis not present

## 2021-11-12 DIAGNOSIS — Z923 Personal history of irradiation: Secondary | ICD-10-CM | POA: Diagnosis not present

## 2021-11-12 DIAGNOSIS — Z79811 Long term (current) use of aromatase inhibitors: Secondary | ICD-10-CM | POA: Diagnosis not present

## 2021-11-12 DIAGNOSIS — Z9013 Acquired absence of bilateral breasts and nipples: Secondary | ICD-10-CM | POA: Diagnosis not present

## 2021-11-12 DIAGNOSIS — K219 Gastro-esophageal reflux disease without esophagitis: Secondary | ICD-10-CM | POA: Diagnosis not present

## 2021-11-12 DIAGNOSIS — D869 Sarcoidosis, unspecified: Secondary | ICD-10-CM | POA: Diagnosis not present

## 2021-11-12 DIAGNOSIS — N200 Calculus of kidney: Secondary | ICD-10-CM | POA: Diagnosis not present

## 2021-11-12 DIAGNOSIS — F419 Anxiety disorder, unspecified: Secondary | ICD-10-CM | POA: Diagnosis not present

## 2021-11-12 LAB — CBC WITH DIFFERENTIAL (CANCER CENTER ONLY)
Abs Immature Granulocytes: 0.01 10*3/uL (ref 0.00–0.07)
Basophils Absolute: 0.1 10*3/uL (ref 0.0–0.1)
Basophils Relative: 1 %
Eosinophils Absolute: 0.1 10*3/uL (ref 0.0–0.5)
Eosinophils Relative: 2 %
HCT: 35.9 % — ABNORMAL LOW (ref 36.0–46.0)
Hemoglobin: 11.9 g/dL — ABNORMAL LOW (ref 12.0–15.0)
Immature Granulocytes: 0 %
Lymphocytes Relative: 14 %
Lymphs Abs: 0.9 10*3/uL (ref 0.7–4.0)
MCH: 29 pg (ref 26.0–34.0)
MCHC: 33.1 g/dL (ref 30.0–36.0)
MCV: 87.3 fL (ref 80.0–100.0)
Monocytes Absolute: 0.6 10*3/uL (ref 0.1–1.0)
Monocytes Relative: 9 %
Neutro Abs: 5.2 10*3/uL (ref 1.7–7.7)
Neutrophils Relative %: 74 %
Platelet Count: 270 10*3/uL (ref 150–400)
RBC: 4.11 MIL/uL (ref 3.87–5.11)
RDW: 12.6 % (ref 11.5–15.5)
WBC Count: 6.8 10*3/uL (ref 4.0–10.5)
nRBC: 0 % (ref 0.0–0.2)

## 2021-11-12 LAB — CMP (CANCER CENTER ONLY)
ALT: 28 U/L (ref 0–44)
AST: 25 U/L (ref 15–41)
Albumin: 3.9 g/dL (ref 3.5–5.0)
Alkaline Phosphatase: 95 U/L (ref 38–126)
Anion gap: 9 (ref 5–15)
BUN: 12 mg/dL (ref 6–20)
CO2: 26 mmol/L (ref 22–32)
Calcium: 9.3 mg/dL (ref 8.9–10.3)
Chloride: 107 mmol/L (ref 98–111)
Creatinine: 0.68 mg/dL (ref 0.44–1.00)
GFR, Estimated: >60 mL/min (ref >60)
Glucose, Bld: 91 mg/dL (ref 70–99)
Potassium: 3.8 mmol/L (ref 3.5–5.1)
Sodium: 142 mmol/L (ref 135–145)
Total Bilirubin: 0.5 mg/dL (ref 0.3–1.2)
Total Protein: 7.4 g/dL (ref 6.5–8.1)

## 2021-11-12 NOTE — Progress Notes (Signed)
[facility]   [phone] [phone]   Clinic Follow up Note   Patient Care Team: Merwyn Katos as PCP - General (Physician Assistant) Jovita Kussmaul, MD as Consulting Physician (General Surgery) Truitt Merle, MD as Consulting Physician (Hematology) Gery Pray, MD as Consulting Physician (Radiation Oncology) Rockwell Germany, RN as Oncology Nurse Navigator Mauro Kaufmann, RN as Oncology Nurse Navigator Alla Feeling, NP as Nurse Practitioner (Nurse Practitioner)  Date of Service:  11/12/2021  CHIEF COMPLAINT: f/u of right breast cancer  CURRENT THERAPY:  Surveillance  ASSESSMENT & PLAN:  [name] is a 55 y.o. female with   1. Malignant neoplasm of upper-outer quadrant of right breast, invasive lobular carcinoma, stage IA, p(T1a,N1a,M0), ER 10%+, PR-, HER2- Grade I   -Diagnosed in 12/2018. S/p b/l mastectomy on 02/24/19. Pathology showed 1/5 + LNs, margins negative. Baseline CA 27.29 03/2019 was WNL. -She completed adjuvant chemo with TC every 3 weeks for 4 cycles.  -She underwent breast reconstruction with implant placements on 06/30/19.  S/p adjuvant radiation with Dr. Sondra Come 08/17/2019 - 09/28/2019 -she began antiestrogen therapy in 06/2019 but was unable to tolerate anastrozole, exemestane, or letrozole, ultimately stopping all therapy in 03/2020. She is on surveillance given her ER was only 10% positive. -last staging scan was PET 03/29/21, at which time she was found to have sarcoidosis (see #2). -she is clinically doing well from a breast cancer standpoint. Labs reviewed, only very mild anemia (hg 11.9). Physical exam was unremarkable. There is no clinical concern for recurrence. -It has been almost 3 years since her initial diagnosis, her risk of recurrence has significantly dropped. -lab and f/u in 6 months   2. Sarcoidosis -Work-up for chest pain showed enlarging mammary and thoracic lymph nodes, PET scan 03/29/21 showed  multiple hypermetabolic mediastinal, internal mammary, and left axillary lymph nodes, the largest was subcarinal lymph node measuring 1.5 cm.  She also had a solitary hypermetabolic 8 mm right lower lobe nodule, and indeterminate small foci of hypermetabolic activity in the left thoracic paraspinal soft tissue -Left axillary lymph node biopsy 04/10/21 showed granulomatous inflammation consistent with sarcoidosis -Seen by Dr. Loanne Drilling of pulmonology 05/16/21, on observation. She will f/u  -CT chest 09/27/21 showed decrease in size of lymph node, and stable tiny b/l pulmonary nodules  3. Social support, anxiety -she lost her 14 y.o. daughter, who had spinal muscular atrophy, in 09/2021. -her husband is being treated for metastatic prostate cancer -she is on Ambien and Effexor, both refilled 11/05/21   PLAN: -lab and f/u with NP Lacie in 6 months -continue breast cancer surveillance   No problem-specific Assessment & Plan notes found for this encounter.   SUMMARY OF ONCOLOGIC HISTORY: Oncology History Overview Note  Cancer Staging Malignant neoplasm of upper-outer quadrant of right breast in female, estrogen receptor positive ([place]) Staging form: Breast, AJCC 8th Edition - Clinical stage from 01/15/2019: Stage IA (cT1b, cN0, cM0, G1, ER+, PR-, HER2-) - Signed by Truitt Merle, MD on 01/26/2019 - Pathologic stage from 02/24/2019: Stage IB (pT1a, pN1a, cM0, G1, ER+, PR-, HER2-) - Signed by Truitt Merle, MD on 03/18/2019     Malignant neoplasm of upper-outer quadrant of right breast in female, estrogen receptor positive ([place])  01/15/2019 Cancer Staging   Staging form: Breast, AJCC 8th Edition - Clinical stage from 01/15/2019: Stage IA (cT1b, cN0, cM0, G1, ER+, PR-, HER2-) - Signed by Truitt Merle, MD on 01/26/2019    01/15/2019 Mammogram   Diagnostic  Mammogram 01/15/19  IMPRESSION Targeted ultrasound is performed, showing right breast 10 o'clock 5 cm from the nipple hypoechoic round mass measuring 0.7 x 0.5  x 0.6 cm. Adjacent to it there is a probable complicated cyst measuring 4 mm. There is no evidence of right axillary lymphadenopathy.   01/15/2019 Initial Biopsy   Diagnosis 01/15/19  Breast, right, needle core biopsy, upper outer - INVASIVE AND IN SITU MAMMARY CARCINOMA WITH CALCIFICATIONS. - SEE COMMENT. 1 of 3 FINAL for Meenach, Chere P ([id number]) Microscopic Comment The carcinoma appears grade I. The malignant cells are positive for cytokeratin AE1/AE3 and negative for E-cadherin. A breast prognostic profile will be performed and the results reported separately. Dr Vicente Males has reviewed the case and concurs with this interpretation. The results are called to [facility] on 01/18/2019. (JBK:ah:ecj 01/18/19)   01/15/2019 Receptors her2   PROGNOSTIC INDICATORS Results: IMMUNOHISTOCHEMICAL AND MORPHOMETRIC ANALYSIS PERFORMED MANUALLY The tumor cells are NEGATIVE for Her2 (0). Estrogen Receptor: 10%, POSITIVE, MODERATE STAINING INTENSITY Progesterone Receptor: 0%, NEGATIVE Proliferation Marker Ki67: <1%   01/21/2019 Initial Diagnosis   Malignant neoplasm of upper-outer quadrant of right breast in female, estrogen receptor positive ([place])   01/29/2019 Breast MRI   IMPRESSION: 1. The biopsy-proven site of malignancy in the upper-outer quadrant of the right breast resides centrally with in an area of suspicious non mass enhancement spanning 6-7 cm.   2. There is a 6 mm suspicious enhancing mass in the lower-inner quadrant of the right breast, posterior depth.   3. There is a 9 mm indeterminate enhancing mass in the lower outer quadrant of the right breast.   4.  No evidence of malignancy in the left breast.   02/24/2019 Surgery   RIGHT MASTECTOMY WITH SENTINEL LYMPH NODE MAPPING AND LEFT PROPHYLACTIC MASTECTOMY by Dr. Marlou Starks  BREAST RECONSTRUCTION WITH PLACEMENT OF TISSUE EXPANDER by Dr. Marla Roe  02/24/19    02/24/2019 Pathology Results   Diagnosis 1. Breast,  simple mastectomy, Left - BENIGN BREAST PARENCHYMA WITH FIBROCYSTIC CHANGE. - NEGATIVE FOR CARCINOMA. 2. Breast, simple mastectomy, Right - LOBULAR CARCINOMA IN SITU, INTERMEDIATE TO HIGH NUCLEAR GRADE. SEE NOTE. - NO EVIDENCE OF RESIDUAL INVASIVE CARCINOMA. - RESECTION MARGINS ARE NEGATIVE FOR IN SITU OR INVASIVE CARCINOMA. - BIOPSY SITE CHANGES. - SEE ONCOLOGY TABLE. 3. Lymph node, sentinel, biopsy, Right #1 - IMMUNOHISTOCHEMICAL STAIN FOR AE1/AE3 IS NEGATIVE FOR CARCINOMA (0/1). 4. Lymph node, sentinel, biopsy, Right - IMMUNOHISTOCHEMICAL STAIN FOR AE1/AE3 IS NEGATIVE FOR CARCINOMA (0/1). 5. Lymph node, sentinel, biopsy, Right #2 - IMMUNOHISTOCHEMICAL STAIN FOR AE1/AE3 IS NEGATIVE FOR CARCINOMA (0/1). 6. Lymph node, sentinel, biopsy, Right - IMMUNOHISTOCHEMICAL STAIN FOR AE1/AE3 IS NEGATIVE FOR CARCINOMA (0/1). 7. Lymph node, sentinel, biopsy, Right - METASTATIC CARCINOMA TO ONE LYMPH NODE (1/1) (CONFIRMED WITH IMMUNOHISTOCHEMICAL STAIN FOR AE1/AE3) - FOCUS OF METASTATIC CARCINOMA MEASURES JUST OVER 0.2 CM IN GREATEST DIMENSION - NO EVIDENCE OF EXTRANODAL EXTENSION   02/24/2019 Cancer Staging   Staging form: Breast, AJCC 8th Edition - Pathologic stage from 02/24/2019: Stage IB (pT1a, pN1a, cM0, G1, ER+, PR-, HER2-) - Signed by Truitt Merle, MD on 03/18/2019    04/01/2019 - 06/03/2019 Chemotherapy   adjuvant Taxol and Carboplatin every 3 weeks for 4 cycles starting in 2 weeks starting 04/01/19-06/03/19   06/2019 - 03/2020 Anti-estrogen oral therapy   Anastrozole 87m daily starting 06/2019, held after 2 weeks due to hot flashes and joint pain and RT. Switched to Exemestane in 10/2019. Switched to Exemestane in 10/2019, ultimately changed  to letrozole in 01/2020 due to worsening joint pain. Ultimately stopped anti-estrogen therapies in 03/2020 due to arthralgias    06/30/2019 Surgery   REMOVAL OF BILATERAL TISSUE EXPANDERS WITH PLACEMENT OF BILATERAL BREAST IMPLANTS by Dr Marla Roe  06/30/19     08/17/2019 - 09/28/2019 Radiation Therapy   Adjuvant Radiaiton with Dr Sondra Come  Site Technique Total Dose (Gy) Dose per Fx (Gy) Completed Fx Beam Energies  Thorax: CW_Rt_Bst_boost Electron 10/10 2 5/5 6E  Thorax: CW_Rt 3D 50/50 2 25/25 10X, 15X, 6X  Thorax: CW_Rt_axilla Complex 50/50 2 25/25 10X    12/23/2019 Survivorship   SCP delivered by Cira Rue, NP    06/02/2020 Imaging   MRI brain with and without contrast IMPRESSION: 1. No acute intracranial abnormality or evidence of metastatic disease. 2. Mild cerebral white matter T2 signal changes, nonspecific though may reflect chronic small vessel ischemic disease, migraines, or prior infection/inflammation.   06/06/2020 Imaging   Bone scan IMPRESSION: No definite scintigraphic evidence of osseous metastases. Mildly abnormal uptake is noted in both knees and feet most consistent with degenerative change.   03/05/2021 Imaging   CT Chest  IMPRESSION: 1. Resolution of ground-glass in the RIGHT upper lobe that was seen on the previous imaging. Compatible with resolution of inflammation. 2. Tiny basilar nodules at the LEFT lung base unchanged since May of 2021 and seen in hind site on the study of May of 2020, largest 3 mm in the LEFT lower lobe. These are likely benign. 3. Slowly enlarging LEFT internal mammary lymph nodes (less than a cm but increasing in size) the with scattered small lymph nodes throughout the chest. Presence of internal mammary lymph nodes is suspicious given patient history. Could consider short interval follow-up in 3 months or PET imaging for further assessment. Smaller internal mammary lymph nodes are present on the RIGHT. 4. 3 mm calculus in the upper pole the RIGHT kidney. 5. Aortic atherosclerosis.   Aortic Atherosclerosis (ICD10-I70.0).   03/29/2021 PET scan   IMPRESSION: 1. There are multiple hypermetabolic mediastinal, internal mammary and left axillary lymph nodes as described. The intensity of  the hypermetabolic activity and distribution of the lymph nodes are suspicious for metastatic disease in this patient with a history of breast cancer. 2. Solitary hypermetabolic right lower lobe pulmonary nodule, also suspicious for metastasis. 3. Indeterminate small foci of hypermetabolic activity in the left thoracic paraspinal soft tissues. 4. No evidence of metastatic disease in the abdomen, pelvis or bones. 5. Bilateral nephrolithiasis.   04/10/2021 Imaging   [place] Left Axillary  IMPRESSION: Suspicious left axillary lymph node.   RECOMMENDATION: Ultrasound-guided core needle biopsy suspicious left axillary lymph node.   04/10/2021 Pathology Results   Diagnosis Lymph node, needle/core biopsy, left axillary node - GRANULOMATOUS INFLAMMATION. Microscopic Comment Granulomatous inflammation is epithelioid in nature. The differential diagnosis includes sarcoidosis. Clinical correlation is recommended. Special stains AFB (acid-fast bacillus) and GMS (fungus) will be reported separately. Dr. Melina Copa reviewed the case      INTERVAL HISTORY:  [name] is here for a follow up of right breast cancer. She was last seen by NP Lacie on 08/01/21. She presents to the clinic alone. She reports she is doing okay overall, denies any changes. She reports she lost her daughter at age 58 just last month in 09/2021; she had been her caregiver for her whole life. She became tearful when she brought it up. And her husband was recently diagnosed with "stage five" prostate cancer. She describes her husband's cancer as "all  throughout his bones." She states her husband is currently receiving chemotherapy. She adds that his counts are down, which is good.  I asked about other children-- she notes her son is in the [employer].   All other systems were reviewed with the patient and are negative.  MEDICAL HISTORY:  Past Medical History:  Diagnosis Date   Anemia    Anxiety    Arthritis    Cancer  ([place])    right breast   Family history of adverse reaction to anesthesia    sister has same issues   GERD (gastroesophageal reflux disease)    Neuromuscular disorder ([facility])    neuropathy   PONV (postoperative nausea and vomiting)     SURGICAL HISTORY: Past Surgical History:  Procedure Laterality Date   BREAST RECONSTRUCTION WITH PLACEMENT OF TISSUE EXPANDER AND FLEX HD (ACELLULAR HYDRATED DERMIS) Bilateral 02/24/2019   Procedure: BREAST RECONSTRUCTION WITH PLACEMENT OF TISSUE EXPANDER AND FLEX HD (ACELLULAR HYDRATED DERMIS);  Surgeon: Wallace Going, DO;  Location: [facility];  Service: Plastics;  Laterality: Bilateral;   BREAST SURGERY     left breast bx   ESOPHAGOGASTRODUODENOSCOPY (EGD) WITH ESOPHAGEAL DILATION     EYE SURGERY     dart to the eye  (right)   FACIAL FRACTURE SURGERY     FRACTURE SURGERY     KNEE SURGERY     MASTECTOMY W/ SENTINEL NODE BIOPSY Bilateral 02/24/2019   Procedure: RIGHT MASTECTOMY WITH SENTINEL LYMPH NODE MAPPING AND LEFT PROPHYLACTIC MASTECTOMY;  Surgeon: Jovita Kussmaul, MD;  Location: [facility];  Service: General;  Laterality: Bilateral;   REMOVAL OF BILATERAL TISSUE EXPANDERS WITH PLACEMENT OF BILATERAL BREAST IMPLANTS Bilateral 06/30/2019   Procedure: REMOVAL OF BILATERAL TISSUE EXPANDERS WITH PLACEMENT OF BILATERAL BREAST IMPLANTS;  Surgeon: Wallace Going, DO;  Location: [facility];  Service: Plastics;  Laterality: Bilateral;   SHOULDER SURGERY      I have reviewed the social history and family history with the patient and they are unchanged from previous note.  ALLERGIES:  is allergic to codeine and tramadol.  MEDICATIONS:  Current Outpatient Medications  Medication Sig Dispense Refill   Cholecalciferol (VITAMIN D3) 125 MCG (5000 UT) CAPS Take 5,000 Units by mouth daily.     ibuprofen (ADVIL,MOTRIN) 200 MG tablet Take 600 mg by mouth every 6 (six) hours as needed for moderate pain.      Multiple Vitamin (MULTIVITAMIN WITH  MINERALS) TABS tablet Take 1 tablet by mouth daily.     omeprazole (PRILOSEC) 20 MG capsule Take 20 mg by mouth daily.     venlafaxine XR (EFFEXOR-XR) 75 MG 24 hr capsule TAKE 1 CAPSULE BY MOUTH DAILY WITH BREAKFAST. 90 capsule 1   zolpidem (AMBIEN) 5 MG tablet TAKE 1 TABLET BY MOUTH EVERY DAY AT BEDTIME AS NEEDED FOR SLEEP 30 tablet 0   No current facility-administered medications for this visit.    PHYSICAL EXAMINATION: ECOG PERFORMANCE STATUS: 1 - Symptomatic but completely ambulatory  Vitals:   11/12/21 1231  BP: 131/87  Pulse: 72  Resp: 18  Temp: 98.5 F (36.9 C)  SpO2: 97%   Wt Readings from Last 3 Encounters:  11/12/21 170 lb 1.6 oz (77.2 kg)  08/01/21 167 lb 8 oz (76 kg)  05/16/21 171 lb 3.2 oz (77.7 kg)     GENERAL:alert, no distress and comfortable SKIN: skin color, texture, turgor are normal, no rashes or significant lesions EYES: normal, Conjunctiva are pink and non-injected, sclera clear  NECK: supple,  thyroid normal size, non-tender, without nodularity LYMPH:  no palpable lymphadenopathy in the cervical, axillary  LUNGS: clear to auscultation and percussion with normal breathing effort HEART: regular rate & rhythm and no murmurs and no lower extremity edema ABDOMEN:abdomen soft, non-tender and normal bowel sounds Musculoskeletal:no cyanosis of digits and no clubbing  NEURO: alert & oriented x 3 with fluent speech, no focal motor/sensory deficits BREAST: No palpable mass, nodules or adenopathy bilaterally. Breast exam benign.   LABORATORY DATA:  I have reviewed the data as listed CBC Latest Ref Rng & Units 11/12/2021 08/01/2021 05/16/2021  WBC 4.0 - 10.5 K/uL 6.8 5.0 5.2  Hemoglobin 12.0 - 15.0 g/dL 11.9(L) 11.9(L) 12.6  Hematocrit 36.0 - 46.0 % 35.9(L) 36.3 37.3  Platelets 150 - 400 K/uL 270 288 282.0     CMP Latest Ref Rng & Units 11/12/2021 08/01/2021 05/16/2021  Glucose 70 - 99 mg/dL 91 91 95  BUN 6 - 20 mg/dL '12 11 14  ' Creatinine 0.44 - 1.00 mg/dL 0.68  0.77 0.67  Sodium 135 - 145 mmol/L 142 143 139  Potassium 3.5 - 5.1 mmol/L 3.8 3.8 4.0  Chloride 98 - 111 mmol/L 107 106 103  CO2 22 - 32 mmol/L '26 28 30  ' Calcium 8.9 - 10.3 mg/dL 9.3 9.9 10.2  Total Protein 6.5 - 8.1 g/dL 7.4 7.3 7.3  Total Bilirubin 0.3 - 1.2 mg/dL 0.5 0.4 0.4  Alkaline Phos 38 - 126 U/L 95 90 87  AST 15 - 41 U/L '25 19 18  ' ALT 0 - 44 U/L '28 23 22      ' RADIOGRAPHIC STUDIES: I have personally reviewed the radiological images as listed and agreed with the findings in the report. No results found.    No orders of the defined types were placed in this encounter.  All questions were answered. The patient knows to call the clinic with any problems, questions or concerns. No barriers to learning was detected. The total time spent in the appointment was 25 minutes.     Truitt Merle, MD 11/12/2021   I, Wilburn Mylar, am acting as scribe for Truitt Merle, MD.   I have reviewed the above documentation for accuracy and completeness, and I agree with the above.

## 2021-11-13 LAB — CANCER ANTIGEN 27.29: CA 27.29: 25.3 U/mL (ref 0.0–38.6)

## 2021-11-18 ENCOUNTER — Encounter: Payer: Self-pay | Admitting: Hematology

## 2021-11-22 ENCOUNTER — Encounter: Payer: Self-pay | Admitting: Hematology

## 2021-12-07 ENCOUNTER — Other Ambulatory Visit: Payer: Self-pay | Admitting: Hematology

## 2021-12-09 ENCOUNTER — Encounter: Payer: Self-pay | Admitting: Hematology

## 2021-12-11 ENCOUNTER — Other Ambulatory Visit: Payer: Self-pay | Admitting: Hematology and Oncology

## 2021-12-11 MED ORDER — ZOLPIDEM TARTRATE 5 MG PO TABS
5.0000 mg | ORAL_TABLET | Freq: Every evening | ORAL | 0 refills | Status: DC | PRN
Start: 2021-12-11 — End: 2022-01-17

## 2021-12-31 ENCOUNTER — Other Ambulatory Visit: Payer: Self-pay

## 2021-12-31 DIAGNOSIS — D869 Sarcoidosis, unspecified: Secondary | ICD-10-CM

## 2021-12-31 NOTE — Progress Notes (Signed)
PFT ordered

## 2022-01-01 ENCOUNTER — Ambulatory Visit: Payer: Self-pay | Admitting: Pulmonary Disease

## 2022-01-15 ENCOUNTER — Other Ambulatory Visit: Payer: Self-pay | Admitting: Hematology

## 2022-01-17 ENCOUNTER — Encounter: Payer: Self-pay | Admitting: Hematology

## 2022-01-17 NOTE — Telephone Encounter (Signed)
For approval

## 2022-03-18 ENCOUNTER — Ambulatory Visit (AMBULATORY_SURGERY_CENTER): Payer: BC Managed Care – PPO | Admitting: *Deleted

## 2022-03-18 ENCOUNTER — Other Ambulatory Visit: Payer: Self-pay

## 2022-03-18 VITALS — Ht 63.0 in | Wt 170.0 lb

## 2022-03-18 DIAGNOSIS — Z86010 Personal history of colonic polyps: Secondary | ICD-10-CM

## 2022-03-18 MED ORDER — NA SULFATE-K SULFATE-MG SULF 17.5-3.13-1.6 GM/177ML PO SOLN: 1.0000 | Freq: Once | ORAL | 0 refills | Status: AC

## 2022-03-18 NOTE — Progress Notes (Signed)

## 2022-03-20 ENCOUNTER — Other Ambulatory Visit: Payer: Self-pay | Admitting: *Deleted

## 2022-03-28 ENCOUNTER — Encounter: Payer: Self-pay | Admitting: Hematology

## 2022-03-30 ENCOUNTER — Other Ambulatory Visit: Payer: Self-pay | Admitting: Hematology

## 2022-04-04 ENCOUNTER — Encounter: Payer: Self-pay | Admitting: Hematology

## 2022-04-04 ENCOUNTER — Encounter: Payer: Self-pay | Admitting: Gastroenterology

## 2022-04-05 ENCOUNTER — Other Ambulatory Visit: Payer: Self-pay

## 2022-04-05 ENCOUNTER — Telehealth: Payer: Self-pay

## 2022-04-05 ENCOUNTER — Other Ambulatory Visit: Payer: Self-pay | Admitting: Nurse Practitioner

## 2022-04-05 MED ORDER — ZOLPIDEM TARTRATE 5 MG PO TABS: ORAL_TABLET | ORAL | 0 refills | Status: DC

## 2022-04-05 NOTE — Telephone Encounter (Signed)
Pt called requesting a refill on her Ambien.  Pt stated her pharmacy told her they have sent the refill request several times but have not gotten a new prescription.  Sent Secure Chat sent to Cira Rue, NP requesting refill on Ambien. ?

## 2022-04-15 ENCOUNTER — Ambulatory Visit (AMBULATORY_SURGERY_CENTER): Payer: BC Managed Care – PPO | Admitting: Gastroenterology

## 2022-04-15 ENCOUNTER — Encounter: Payer: Self-pay | Admitting: Gastroenterology

## 2022-04-15 VITALS — BP 148/64 | HR 74 | Temp 97.3°F | Resp 16 | Ht 62.0 in | Wt 170.0 lb

## 2022-04-15 DIAGNOSIS — Z86010 Personal history of colonic polyps: Secondary | ICD-10-CM

## 2022-04-15 DIAGNOSIS — K635 Polyp of colon: Secondary | ICD-10-CM | POA: Diagnosis not present

## 2022-04-15 DIAGNOSIS — D122 Benign neoplasm of ascending colon: Secondary | ICD-10-CM | POA: Diagnosis not present

## 2022-04-15 DIAGNOSIS — D120 Benign neoplasm of cecum: Secondary | ICD-10-CM

## 2022-04-15 MED ORDER — SODIUM CHLORIDE 0.9 % IV SOLN: 500.0000 mL | Freq: Once | INTRAVENOUS | Status: DC

## 2022-04-15 NOTE — Progress Notes (Signed)
? ?Referring Provider: Heywood Bene, * ?Primary Care Physician:  Heywood Bene, PA-C ? ?Indication for Procedure:  Colon cancer screening ? ? ?IMPRESSION:  ?Need for colon cancer screening with a limited evaluation in 2021 due to retained stool ?Appropriate candidate for monitored anesthesia care ? ?PLAN: ?Colonoscopy in the [facility] today ? ? ?HPI: [name] is a 56 y.o. female presents for surveillance colonoscopy. ? ?Prior endoscopic history: ?Colonoscopy 01/26/20: ?- Preparation of the colon was poor. ?- One 1 mm polyp in the proximal ascending colon, removed with a cold snare. Resected and ?retrieved. Pathology showed a tubular adenoma.  ?- Stool in the entire examined colon. Fluid aspiration performed. ?- The examination was otherwise normal on direct and retroflexion views. ? ?No baseline GI symptoms.  ? ?No known family history of colon cancer or polyps. No family history of uterine/endometrial cancer, pancreatic cancer or gastric/stomach cancer. ? ? ?Past Medical History:  ?Diagnosis Date  ? Anemia   ? Anxiety   ? Arthritis   ? Cancer [facility])   ? right breast  ? Family history of adverse reaction to anesthesia   ? sister has same issues  ? GERD (gastroesophageal reflux disease)   ? Neuromuscular disorder ([employer])   ? neuropathy  ? PONV (postoperative nausea and vomiting)   ? ? ?Past Surgical History:  ?Procedure Laterality Date  ? BREAST RECONSTRUCTION WITH PLACEMENT OF TISSUE EXPANDER AND FLEX HD (ACELLULAR HYDRATED DERMIS) Bilateral 02/24/2019  ? Procedure: BREAST RECONSTRUCTION WITH PLACEMENT OF TISSUE EXPANDER AND FLEX HD (ACELLULAR HYDRATED DERMIS);  Surgeon: Wallace Going, DO;  Location: [facility];  Service: Plastics;  Laterality: Bilateral;  ? BREAST SURGERY    ? left breast bx  ? COLONOSCOPY    ? ESOPHAGOGASTRODUODENOSCOPY (EGD) WITH ESOPHAGEAL DILATION    ? EYE SURGERY    ? dart to the eye  (right)  ? FACIAL FRACTURE SURGERY    ? FRACTURE SURGERY    ? KNEE SURGERY    ? MASTECTOMY W/  SENTINEL NODE BIOPSY Bilateral 02/24/2019  ? Procedure: RIGHT MASTECTOMY WITH SENTINEL LYMPH NODE MAPPING AND LEFT PROPHYLACTIC MASTECTOMY;  Surgeon: Jovita Kussmaul, MD;  Location: [facility];  Service: General;  Laterality: Bilateral;  ? POLYPECTOMY    ? REMOVAL OF BILATERAL TISSUE EXPANDERS WITH PLACEMENT OF BILATERAL BREAST IMPLANTS Bilateral 06/30/2019  ? Procedure: REMOVAL OF BILATERAL TISSUE EXPANDERS WITH PLACEMENT OF BILATERAL BREAST IMPLANTS;  Surgeon: Wallace Going, DO;  Location: [facility];  Service: Plastics;  Laterality: Bilateral;  ? SHOULDER SURGERY    ? ? ?Current Outpatient Medications  ?Medication Sig Dispense Refill  ? Cholecalciferol (VITAMIN D3) 125 MCG (5000 UT) CAPS Take 5,000 Units by mouth daily. (Patient not taking: Reported on 03/18/2022)    ? ibuprofen (ADVIL,MOTRIN) 200 MG tablet Take 600 mg by mouth every 6 (six) hours as needed for moderate pain.     ? Multiple Vitamin (MULTIVITAMIN WITH MINERALS) TABS tablet Take 1 tablet by mouth daily. (Patient not taking: Reported on 03/18/2022)    ? omeprazole (PRILOSEC) 20 MG capsule Take 20 mg by mouth daily.    ? venlafaxine XR (EFFEXOR-XR) 75 MG 24 hr capsule TAKE 1 CAPSULE BY MOUTH DAILY WITH BREAKFAST. 90 capsule 1  ? zolpidem (AMBIEN) 5 MG tablet TAKE 1 TABLET BY MOUTH EVERY DAY AT BEDTIME AS NEEDED FOR SLEEP 30 tablet 0  ? ?No current facility-administered medications for this visit.  ? ? ?Allergies as of 04/15/2022 - Review Complete 03/18/2022  ?  Allergen Reaction Noted  ? Codeine Nausea And Vomiting 11/05/2014  ? Tramadol Nausea And Vomiting 11/05/2014  ? ? ?Family History  ?Problem Relation Age of Onset  ? Brain cancer Father   ? Cancer Maternal Uncle   ?     melanoma   ? Cancer Paternal Uncle   ?     gastric cancer  ? Spinal muscular atrophy Daughter   ? Spinal muscular atrophy Child   ? Breast cancer Neg Hx   ? Colon cancer Neg Hx   ? Esophageal cancer Neg Hx   ? Stomach cancer Neg Hx   ? Rectal cancer Neg Hx   ? Colon  polyps Neg Hx   ? Crohn's disease Neg Hx   ? ? ? ?Physical Exam: ?General:   Alert,  well-nourished, pleasant and cooperative in NAD ?Head:  Normocephalic and atraumatic. ?Eyes:  Sclera clear, no icterus.   Conjunctiva pink. ?Mouth:  No deformity or lesions.   ?Neck:  Supple; no masses or thyromegaly. ?Lungs:  Clear throughout to auscultation.   No wheezes. ?Heart:  Regular rate and rhythm; no murmurs. ?Abdomen:  Soft, non-tender, nondistended, normal bowel sounds, no rebound or guarding.  ?Msk:  Symmetrical. No boney deformities ?LAD: No inguinal or umbilical LAD ?Extremities:  No clubbing or edema. ?Neurologic:  Alert and  oriented x4;  grossly nonfocal ?Skin:  No obvious rash or bruise. ?Psych:  Alert and cooperative. Normal mood and affect. ? ? ? ? ?Studies/Results: ?No results found. ? ? ? ?Mizael Sagar L. Tarri Glenn, MD, MPH ?04/15/2022, 10:03 AM ? ? ?  ?

## 2022-04-15 NOTE — Progress Notes (Signed)
Pt's states no medical or surgical changes since previsit or office visit. 

## 2022-04-15 NOTE — Progress Notes (Signed)
PT taken to PACU. Monitors in place. VSS. Report given to RN. 

## 2022-04-15 NOTE — Op Note (Addendum)
[place] ?Patient Name: [name] ?Procedure Date: 04/15/2022 10:05 AM ?MRN: [id number] ?Endoscopist: Thornton Park MD, MD ?Age: 56 ?Referring MD:  ?Date of Birth: March 16, 1966 ?Gender: Female ?Account #: [id number] ?Procedure:                Colonoscopy ?Indications:              Surveillance: History of adenomatous polyps,  ?                          inadequate prep on last exam (<71yr ?                          Colonoscopy 01/26/20: poor prep, 162mtubular adenoma ?Medicines:                Monitored Anesthesia Care ?Procedure:                Pre-Anesthesia Assessment: ?                          - Prior to the procedure, a History and Physical  ?                          was performed, and patient medications and  ?                          allergies were reviewed. The patient's tolerance of  ?                          previous anesthesia was also reviewed. The risks  ?                          and benefits of the procedure and the sedation  ?                          options and risks were discussed with the patient.  ?                          All questions were answered, and informed consent  ?                          was obtained. Prior Anticoagulants: The patient has  ?                          taken no previous anticoagulant or antiplatelet  ?                          agents. ASA Grade Assessment: II - A patient with  ?                          mild systemic disease. After reviewing the risks  ?                          and benefits, the patient was deemed in  ?  satisfactory condition to undergo the procedure. ?                          After obtaining informed consent, the colonoscope  ?                          was passed under direct vision. Throughout the  ?                          procedure, the patient's blood pressure, pulse, and  ?                          oxygen saturations were monitored continuously. The  ?                          Olympus CF-HQ190L  (#[id number]) Colonoscope was  ?                          introduced through the anus and advanced to the 2  ?                          cm into the ileum. A second forward view of the  ?                          right colon was performed. The colonoscopy was  ?                          performed without difficulty. The patient tolerated  ?                          the procedure well. The quality of the bowel  ?                          preparation was good. The terminal ileum, ileocecal  ?                          valve, appendiceal orifice, and rectum were  ?                          photographed. ?Scope In: 10:21:23 AM ?Scope Out: 10:37:13 AM ?Scope Withdrawal Time: 0 hours 10 minutes 40 seconds  ?Total Procedure Duration: 0 hours 15 minutes 50 seconds  ?Findings:                 The perianal and digital rectal examinations were  ?                          normal. ?                          A few small and large-mouthed diverticula were  ?                          found in the sigmoid colon and descending colon. ?  A 5 mm polyp was found in the proximal ascending  ?                          colon. The polyp was flat. The polyp was removed  ?                          with a cold snare. Resection and retrieval were  ?                          complete. Estimated blood loss was minimal. ?                          A 6 mm polyp was found in the cecum. The polyp was  ?                          flat. The polyp was removed with a cold snare.  ?                          Resection and retrieval were complete. Estimated  ?                          blood loss was minimal. ?                          The exam was otherwise without abnormality on  ?                          direct and retroflexion views. ?Complications:            No immediate complications. ?Estimated Blood Loss:     Estimated blood loss was minimal. ?Impression:               - Diverticulosis in the sigmoid colon and in the  ?                           descending colon. ?                          - One 5 mm polyp in the proximal ascending colon,  ?                          removed with a cold snare. Resected and retrieved. ?                          - One 6 mm polyp in the cecum, removed with a cold  ?                          snare. Resected and retrieved. ?                          - The examination was otherwise normal on direct  ?                          and retroflexion  views. ?Recommendation:           - Patient has a contact number available for  ?                          emergencies. The signs and symptoms of potential  ?                          delayed complications were discussed with the  ?                          patient. Return to normal activities tomorrow.  ?                          Written discharge instructions were provided to the  ?                          patient. ?                          - Resume previous diet. ?                          - Continue present medications. ?                          - Await pathology results. ?                          - Repeat colonoscopy in 5 years for surveillance.  ?                          Use a two day bowel prep at that time. ?                          - Emerging evidence supports eating a diet of  ?                          fruits, vegetables, grains, calcium, and yogurt  ?                          while reducing red meat and alcohol may reduce the  ?                          risk of colon cancer. ?                          - Thank you for allowing me to be involved in your  ?                          colon cancer prevention. ?Thornton Park MD, MD ?04/15/2022 10:43:10 AM ?This report has been signed electronically. ?

## 2022-04-15 NOTE — Progress Notes (Signed)
Called to room to assist during endoscopic procedure.  Patient ID and intended procedure confirmed with present staff. Received instructions for my participation in the procedure from the performing physician.  

## 2022-04-15 NOTE — Patient Instructions (Signed)
Thank you for coming to see [place] today! ?Recommend next surveillance colonoscopy in 5 years. ? ? ?YOU HAD AN ENDOSCOPIC PROCEDURE TODAY AT [facility]:   Refer to the procedure report that was given to you for any specific questions about what was found during the examination.  If the procedure report does not answer your questions, please call your gastroenterologist to clarify.  If you requested that your care partner not be given the details of your procedure findings, then the procedure report has been included in a sealed envelope for you to review at your convenience later. ? ?YOU SHOULD EXPECT: Some feelings of bloating in the abdomen. Passage of more gas than usual.  Walking can help get rid of the air that was put into your GI tract during the procedure and reduce the bloating. If you had a lower endoscopy (such as a colonoscopy or flexible sigmoidoscopy) you may notice spotting of blood in your stool or on the toilet paper. If you underwent a bowel prep for your procedure, you may not have a normal bowel movement for a few days. ? ?Please Note:  You might notice some irritation and congestion in your nose or some drainage.  This is from the oxygen used during your procedure.  There is no need for concern and it should clear up in a day or so. ? ?SYMPTOMS TO REPORT IMMEDIATELY: ? ?Following lower endoscopy (colonoscopy or flexible sigmoidoscopy): ? Excessive amounts of blood in the stool ? Significant tenderness or worsening of abdominal pains ? Swelling of the abdomen that is new, acute ? Fever of 100?F or higher ? ? ?For urgent or emergent issues, a gastroenterologist can be reached at any hour by calling [phone]. ?Do not use MyChart messaging for urgent concerns.  ? ? ?DIET:  We do recommend a small meal at first, but then you may proceed to your regular diet.  Drink plenty of fluids but you should avoid alcoholic beverages for 24 hours. ? ?ACTIVITY:  You should plan to take it easy  for the rest of today and you should NOT DRIVE or use heavy machinery until tomorrow (because of the sedation medicines used during the test).   ? ?FOLLOW UP: ?Our staff will call the number listed on your records 48-72 hours following your procedure to check on you and address any questions or concerns that you may have regarding the information given to you following your procedure. If we do not reach you, we will leave a message.  We will attempt to reach you two times.  During this call, we will ask if you have developed any symptoms of COVID 19. If you develop any symptoms (ie: fever, flu-like symptoms, shortness of breath, cough etc.) before then, please call [phone].  If you test positive for Covid 19 in the 2 weeks post procedure, please call and report this information to [place].   ? ?If any biopsies were taken you will be contacted by phone or by letter within the next 1-3 weeks.  Please call us at [phone] if you have not heard about the biopsies in 3 weeks.  ? ? ?SIGNATURES/CONFIDENTIALITY: ?You and/or your care partner have signed paperwork which will be entered into your electronic medical record.  These signatures attest to the fact that that the information above on your After Visit Summary has been reviewed and is understood.  Full responsibility of the confidentiality of this discharge information lies with you and/or your care-partner.  ?

## 2022-04-17 ENCOUNTER — Encounter: Payer: Self-pay | Admitting: Gastroenterology

## 2022-04-17 ENCOUNTER — Telehealth: Payer: Self-pay | Admitting: *Deleted

## 2022-04-17 NOTE — Telephone Encounter (Signed)
?  Follow up Call- ? ? ?  04/15/2022  ? 10:06 AM 01/26/2020  ?  1:12 PM  ?Call back number  ?Post procedure Call Back phone  # [phone]  ?Permission to leave phone message Yes Yes  ?  ? ?Patient questions: ? ?Do you have a fever, pain , or abdominal swelling? No. ?Pain Score  0 * ? ?Have you tolerated food without any problems? Yes.   ? ?Have you been able to return to your normal activities? Yes.   ? ?Do you have any questions about your discharge instructions: ?Diet   No. ?Medications  No. ?Follow up visit  No. ? ?Do you have questions or concerns about your Care? No. ? ?Actions: ?* If pain score is 4 or above: ?No action needed, pain <4. ? ? ?

## 2022-05-03 ENCOUNTER — Other Ambulatory Visit: Payer: Self-pay | Admitting: Nurse Practitioner

## 2022-05-12 NOTE — Progress Notes (Unsigned)
[facility]   [phone] [phone]   Clinic Follow up Note   Patient Care Team: Williams, Breejante J, PA-C as PCP - General (Physician Assistant) Toth, Paul III, MD as Consulting Physician (General Surgery) Feng, Yan, MD as Consulting Physician (Hematology) Kinard, James, MD as Consulting Physician (Radiation Oncology) Martini, Keisha N, RN as Oncology Nurse Navigator Stuart, Dawn C, RN as Oncology Nurse Navigator Burton, Lacie K, NP as Nurse Practitioner (Nurse Practitioner) 05/14/2022  CHIEF COMPLAINT: Follow up right breast cancer   SUMMARY OF ONCOLOGIC HISTORY: Oncology History Overview Note  Cancer Staging Malignant neoplasm of upper-outer quadrant of right breast in female, estrogen receptor positive (HCC) Staging form: Breast, AJCC 8th Edition - Clinical stage from 01/15/2019: Stage IA (cT1b, cN0, cM0, G1, ER+, PR-, HER2-) - Signed by Feng, Yan, MD on 01/26/2019 - Pathologic stage from 02/24/2019: Stage IB (pT1a, pN1a, cM0, G1, ER+, PR-, HER2-) - Signed by Feng, Yan, MD on 03/18/2019     Malignant neoplasm of upper-outer quadrant of right breast in female, estrogen receptor positive (HCC)  01/15/2019 Cancer Staging   Staging form: Breast, AJCC 8th Edition - Clinical stage from 01/15/2019: Stage IA (cT1b, cN0, cM0, G1, ER+, PR-, HER2-) - Signed by Feng, Yan, MD on 01/26/2019    01/15/2019 Mammogram   Diagnostic Mammogram 01/15/19  IMPRESSION Targeted ultrasound is performed, showing right breast 10 o'clock 5 cm from the nipple hypoechoic round mass measuring 0.7 x 0.5 x 0.6 cm. Adjacent to it there is a probable complicated cyst measuring 4 mm. There is no evidence of right axillary lymphadenopathy.    01/15/2019 Initial Biopsy   Diagnosis 01/15/19  Breast, right, needle core biopsy, upper outer - INVASIVE AND IN SITU MAMMARY CARCINOMA WITH CALCIFICATIONS. - SEE COMMENT. 1 of 3 FINAL for Needs, [name] ([id number]) Microscopic  Comment The carcinoma appears grade I. The malignant cells are positive for cytokeratin AE1/AE3 and negative for E-cadherin. A breast prognostic profile will be performed and the results reported separately. Dr Julia Manny has reviewed the case and concurs with this interpretation. The results are called to [facility] on 01/18/2019. (JBK:ah:ecj 01/18/19)    01/15/2019 Receptors her2   PROGNOSTIC INDICATORS Results: IMMUNOHISTOCHEMICAL AND MORPHOMETRIC ANALYSIS PERFORMED MANUALLY The tumor cells are NEGATIVE for Her2 (0). Estrogen Receptor: 10%, POSITIVE, MODERATE STAINING INTENSITY Progesterone Receptor: 0%, NEGATIVE Proliferation Marker Ki67: <1%    01/21/2019 Initial Diagnosis   Malignant neoplasm of upper-outer quadrant of right breast in female, estrogen receptor positive (HCC)    01/29/2019 Breast MRI   IMPRESSION: 1. The biopsy-proven site of malignancy in the upper-outer quadrant of the right breast resides centrally with in an area of suspicious non mass enhancement spanning 6-7 cm.   2. There is a 6 mm suspicious enhancing mass in the lower-inner quadrant of the right breast, posterior depth.   3. There is a 9 mm indeterminate enhancing mass in the lower outer quadrant of the right breast.   4.  No evidence of malignancy in the left breast.    02/24/2019 Surgery   RIGHT MASTECTOMY WITH SENTINEL LYMPH NODE MAPPING AND LEFT PROPHYLACTIC MASTECTOMY by Dr. Toth  BREAST RECONSTRUCTION WITH PLACEMENT OF TISSUE EXPANDER by Dr. Dillingham  02/24/19    02/24/2019 Pathology Results   Diagnosis 1. Breast, simple mastectomy, Left - BENIGN BREAST PARENCHYMA WITH FIBROCYSTIC CHANGE. - NEGATIVE FOR CARCINOMA. 2. Breast, simple mastectomy, Right - LOBULAR CARCINOMA IN SITU, INTERMEDIATE TO HIGH NUCLEAR GRADE. SEE NOTE. -   NO EVIDENCE OF RESIDUAL INVASIVE CARCINOMA. - RESECTION MARGINS ARE NEGATIVE FOR IN SITU OR INVASIVE CARCINOMA. - BIOPSY SITE CHANGES. - SEE  ONCOLOGY TABLE. 3. Lymph node, sentinel, biopsy, Right #1 - IMMUNOHISTOCHEMICAL STAIN FOR AE1/AE3 IS NEGATIVE FOR CARCINOMA (0/1). 4. Lymph node, sentinel, biopsy, Right - IMMUNOHISTOCHEMICAL STAIN FOR AE1/AE3 IS NEGATIVE FOR CARCINOMA (0/1). 5. Lymph node, sentinel, biopsy, Right #2 - IMMUNOHISTOCHEMICAL STAIN FOR AE1/AE3 IS NEGATIVE FOR CARCINOMA (0/1). 6. Lymph node, sentinel, biopsy, Right - IMMUNOHISTOCHEMICAL STAIN FOR AE1/AE3 IS NEGATIVE FOR CARCINOMA (0/1). 7. Lymph node, sentinel, biopsy, Right - METASTATIC CARCINOMA TO ONE LYMPH NODE (1/1) (CONFIRMED WITH IMMUNOHISTOCHEMICAL STAIN FOR AE1/AE3) - FOCUS OF METASTATIC CARCINOMA MEASURES JUST OVER 0.2 CM IN GREATEST DIMENSION - NO EVIDENCE OF EXTRANODAL EXTENSION    02/24/2019 Cancer Staging   Staging form: Breast, AJCC 8th Edition - Pathologic stage from 02/24/2019: Stage IB (pT1a, pN1a, cM0, G1, ER+, PR-, HER2-) - Signed by Truitt Merle, MD on 03/18/2019    04/01/2019 - 06/03/2019 Chemotherapy   adjuvant Taxol and Carboplatin every 3 weeks for 4 cycles starting in 2 weeks starting 04/01/19-06/03/19   06/2019 - 03/2020 Anti-estrogen oral therapy   Anastrozole 32m daily starting 06/2019, held after 2 weeks due to hot flashes and joint pain and RT. Switched to Exemestane in 10/2019. Switched to Exemestane in 10/2019, ultimately changed to letrozole in 01/2020 due to worsening joint pain. Ultimately stopped anti-estrogen therapies in 03/2020 due to arthralgias    06/30/2019 Surgery   REMOVAL OF BILATERAL TISSUE EXPANDERS WITH PLACEMENT OF BILATERAL BREAST IMPLANTS by Dr DMarla Roe 06/30/19    08/17/2019 - 09/28/2019 Radiation Therapy   Adjuvant Radiaiton with Dr KSondra Come Site Technique Total Dose (Gy) Dose per Fx (Gy) Completed Fx Beam Energies  Thorax: CW_Rt_Bst_boost Electron 10/10 2 5/5 6E  Thorax: CW_Rt 3D 50/50 2 25/25 10X, 15X, 6X  Thorax: CW_Rt_axilla Complex 50/50 2 25/25 10X    12/23/2019 Survivorship   SCP delivered by LCira Rue NP     06/02/2020 Imaging   MRI brain with and without contrast IMPRESSION: 1. No acute intracranial abnormality or evidence of metastatic disease. 2. Mild cerebral white matter T2 signal changes, nonspecific though may reflect chronic small vessel ischemic disease, migraines, or prior infection/inflammation.   06/06/2020 Imaging   Bone scan IMPRESSION: No definite scintigraphic evidence of osseous metastases. Mildly abnormal uptake is noted in both knees and feet most consistent with degenerative change.   03/05/2021 Imaging   CT Chest  IMPRESSION: 1. Resolution of ground-glass in the RIGHT upper lobe that was seen on the previous imaging. Compatible with resolution of inflammation. 2. Tiny basilar nodules at the LEFT lung base unchanged since May of 2021 and seen in hind site on the study of May of 2020, largest 3 mm in the LEFT lower lobe. These are likely benign. 3. Slowly enlarging LEFT internal mammary lymph nodes (less than a cm but increasing in size) the with scattered small lymph nodes throughout the chest. Presence of internal mammary lymph nodes is suspicious given patient history. Could consider short interval follow-up in 3 months or PET imaging for further assessment. Smaller internal mammary lymph nodes are present on the RIGHT. 4. 3 mm calculus in the upper pole the RIGHT kidney. 5. Aortic atherosclerosis.   Aortic Atherosclerosis (ICD10-I70.0).   03/29/2021 PET scan   IMPRESSION: 1. There are multiple hypermetabolic mediastinal, internal mammary and left axillary lymph nodes as described. The intensity of the hypermetabolic activity and distribution of the lymph  nodes are suspicious for metastatic disease in this patient with a history of breast cancer. 2. Solitary hypermetabolic right lower lobe pulmonary nodule, also suspicious for metastasis. 3. Indeterminate small foci of hypermetabolic activity in the left thoracic paraspinal soft tissues. 4. No evidence  of metastatic disease in the abdomen, pelvis or bones. 5. Bilateral nephrolithiasis.   04/10/2021 Imaging   [place] Left Axillary  IMPRESSION: Suspicious left axillary lymph node.   RECOMMENDATION: Ultrasound-guided core needle biopsy suspicious left axillary lymph node.   04/10/2021 Pathology Results   Diagnosis Lymph node, needle/core biopsy, left axillary node - GRANULOMATOUS INFLAMMATION. Microscopic Comment Granulomatous inflammation is epithelioid in nature. The differential diagnosis includes sarcoidosis. Clinical correlation is recommended. Special stains AFB (acid-fast bacillus) and GMS (fungus) will be reported separately. Dr. Melina Copa reviewed the case     CURRENT THERAPY: Surveillance   INTERVAL HISTORY: Ms. Tobin returns for follow up as scheduled. Last seen by Dr. Burr Medico 11/12/21.  She has had a hard time in the interim, her daughter Lilia Pro passed away in 10/31/2023 and her husband has recently been diagnosed with stage IV prostate cancer, followed by Dr. Alen Blew.  This patient has family, faith, and social support.  She has moderate to severe pain at the right chest/breasts when she is doing cross body reaching or movements such as cleaning.  She does not have this pain in any other time, it eventually resolves after a few minutes.  She has chronic back pain.  Denies any other new or worsening pains.  She plans to get her eyes checked soon as she has developed dizziness when she moves her eyes up or down or lies on her left side.  All other systems were reviewed with the patient and are negative.  MEDICAL HISTORY:  Past Medical History:  Diagnosis Date   Anemia    Anxiety    Arthritis    Cancer ([place])    right breast   Family history of adverse reaction to anesthesia    sister has same issues   GERD (gastroesophageal reflux disease)    Neuromuscular disorder (HCC)    neuropathy   PONV (postoperative nausea and vomiting)     SURGICAL HISTORY: Past Surgical History:   Procedure Laterality Date   BREAST RECONSTRUCTION WITH PLACEMENT OF TISSUE EXPANDER AND FLEX HD (ACELLULAR HYDRATED DERMIS) Bilateral 02/24/2019   Procedure: BREAST RECONSTRUCTION WITH PLACEMENT OF TISSUE EXPANDER AND FLEX HD (ACELLULAR HYDRATED DERMIS);  Surgeon: Wallace Going, DO;  Location: [place];  Service: Plastics;  Laterality: Bilateral;   BREAST SURGERY     left breast bx   COLONOSCOPY     ESOPHAGOGASTRODUODENOSCOPY (EGD) WITH ESOPHAGEAL DILATION     EYE SURGERY     dart to the eye  (right)   FACIAL FRACTURE SURGERY     FRACTURE SURGERY     KNEE SURGERY     MASTECTOMY W/ SENTINEL NODE BIOPSY Bilateral 02/24/2019   Procedure: RIGHT MASTECTOMY WITH SENTINEL LYMPH NODE MAPPING AND LEFT PROPHYLACTIC MASTECTOMY;  Surgeon: Jovita Kussmaul, MD;  Location: [place];  Service: General;  Laterality: Bilateral;   POLYPECTOMY     REMOVAL OF BILATERAL TISSUE EXPANDERS WITH PLACEMENT OF BILATERAL BREAST IMPLANTS Bilateral 06/30/2019   Procedure: REMOVAL OF BILATERAL TISSUE EXPANDERS WITH PLACEMENT OF BILATERAL BREAST IMPLANTS;  Surgeon: Wallace Going, DO;  Location: [place];  Service: Plastics;  Laterality: Bilateral;   SHOULDER SURGERY      I have reviewed the social history and family history with  the patient and they are unchanged from previous note.  ALLERGIES:  is allergic to codeine and tramadol.  MEDICATIONS:  Current Outpatient Medications  Medication Sig Dispense Refill   Cholecalciferol (VITAMIN D3) 125 MCG (5000 UT) CAPS Take 5,000 Units by mouth daily. (Patient not taking: Reported on 03/18/2022)     ibuprofen (ADVIL,MOTRIN) 200 MG tablet Take 600 mg by mouth every 6 (six) hours as needed for moderate pain.      Multiple Vitamin (MULTIVITAMIN WITH MINERALS) TABS tablet Take 1 tablet by mouth daily. (Patient not taking: Reported on 03/18/2022)     omeprazole (PRILOSEC) 20 MG capsule Take 20 mg by mouth daily.     venlafaxine XR (EFFEXOR-XR) 75 MG 24 hr  capsule TAKE 1 CAPSULE BY MOUTH DAILY WITH BREAKFAST. 90 capsule 1   zolpidem (AMBIEN) 5 MG tablet TAKE 1 TABLET BY MOUTH AT BEDTIME AS NEEDED FOR SLEEP 30 tablet 0   No current facility-administered medications for this visit.    PHYSICAL EXAMINATION:  Vitals:   05/14/22 1042  BP: 131/82  Pulse: 85  Temp: 97.8 F (36.6 C)  SpO2: 99%   Filed Weights   05/14/22 1042  Weight: 176 lb 9.6 oz (80.1 kg)    GENERAL:alert, no distress and comfortable SKIN: No rash EYES: sclera clear NECK: Without mass LYMPH:  no palpable cervical or supraclavicular lymphadenopathy  LUNGS: clear with normal breathing effort HEART: regular rate & rhythm, no lower extremity edema ABDOMEN:abdomen soft, non-tender and normal bowel sounds Musculoskeletal: No focal spinal tenderness.  Mild TTP to the right pectoral muscle NEURO: alert & oriented x 3 with fluent speech, no focal motor/sensory deficits Breast exam: S/Snyder bilateral mastectomy and reconstruction, breasts are asymmetric, nipples surgically absent.  Incisions completely healed with minimal scar tissue.  No palpable mass or nodularity at the incision, chest wall, or either axilla could appreciate.  LABORATORY DATA:  I have reviewed the data as listed    Latest Ref Rng & Units 05/14/2022   10:16 AM 11/12/2021   11:57 AM 08/01/2021    8:40 AM  CBC  WBC 4.0 - 10.5 K/uL 4.9   6.8   5.0    Hemoglobin 12.0 - 15.0 g/dL 12.2   11.9   11.9    Hematocrit 36.0 - 46.0 % 36.8   35.9   36.3    Platelets 150 - 400 K/uL 271   270   288          Latest Ref Rng & Units 05/14/2022   10:16 AM 11/12/2021   11:57 AM 08/01/2021    8:40 AM  CMP  Glucose 70 - 99 mg/dL 112   91   91    BUN 6 - 20 mg/dL 11   12   11    Creatinine 0.44 - 1.00 mg/dL 0.75   0.68   0.77    Sodium 135 - 145 mmol/L 141   142   143    Potassium 3.5 - 5.1 mmol/L 3.7   3.8   3.8    Chloride 98 - 111 mmol/L 106   107   106    CO2 22 - 32 mmol/L 28   26   28    Calcium 8.9 - 10.3 mg/dL  9.9   9.3   9.9    Total Protein 6.5 - 8.1 g/dL 7.1   7.4   7.3    Total Bilirubin 0.3 - 1.2 mg/dL 0.5   0.5   0.4      Alkaline Phos 38 - 126 U/L 81   95   90    AST 15 - 41 U/L _0 ALT 0 - 44 U/L _1 RADIOGRAPHIC STUDIES: I have personally reviewed the radiological images as listed and agreed with the findings in the report. No results found.   ASSESSMENT & PLAN: 56 year old female   1. Malignant neoplasm of upper-outer quadrant of right breast, invasive lobular carcinoma, stage IA, Snyder(T1a,N1a,M0), ER 10%+, PR-, HER2- Grade I   -Diagnosed in 12/2018. S/Snyder b/l mastectomy on 02/24/19. Pathology showed 1/5 + LNs, margins negative -She completed adjuvant chemo with TC every 3 weeks for 4 cycles.  -She underwent breast reconstruction with implant placements on 06/30/19.  S/Snyder adjuvant radiation with Dr. Randa Ngo 08/17/2019 - 09/28/2019 -She started antiestrogen therapy with Anastrozole in 06/2019.  She tolerated poorly with significant hot flash, and joint stiffness. She stopped after 2 weeks and eventually started exemestane in 10/2019 after RT. She did not tolerate exemestane well either, and ultimately changed to letrozole but ultimately discontinued altogether in 03/2020 due to poor tolerance secondary to arthralgias. Her ER is only 10% positive, ER and HER2 are negative, her breast cancer behaves more like triple negative. -on breast cancer surveillance  -On surveillance she developed headaches and bone pain, brain MRI 06/02/20 and bone scan 06/06/20 negative -Ms. Strange is clinically doing well.  She has right chest pain with abduction and certain movements.  Exam shows mild TTP at the right reconstructed breast/chest wall which is not new for her.  This is likely MSK, I reviewed symptom management. Physical exam is otherwise benign, labs are normal.  There is no strong clinical concern for breast cancer recurrence. -She is over 3 years from initial diagnosis, continue  surveillance. -Next surveillance visit in 6 months, or sooner if needed   2.  Sarcoidosis -Work-up for chest pain showed enlarging mammary and thoracic lymph nodes, PET scan 03/29/2021 showed multiple hypermetabolic mediastinal, internal mammary, and left axillary lymph nodes, the largest was subcarinal lymph node measuring 1.5 cm.  She also had a solitary hypermetabolic 8 mm right lower lobe nodule, and indeterminate small foci of hypermetabolic activity in the left thoracic paraspinal soft tissue -Left axillary lymph node biopsy 04/10/2021 showed granulomatous inflammation consistent with sarcoidosis -Repeat CT chest 09/27/2021 showed stable bilateral pulmonary nodules and decreased size of left internal mammary lymph node -Followed by Dr. Loanne Drilling of pulmonology  3.  Social -Her daughter has been on a ventilator for most of her life, she passed away in 2021/10/19 -Patient's husband was recently diagnosed with metastatic prostate cancer to the bone, currently a patient of Dr. Alen Blew -I offered her emotional support and active listening, I reviewed our available supportive care resources  Plan: -Labs reviewed, CA 27.29 is pending -Symptom management for likely MSK related right reconstructed breast/chest wall pain, she knows to call with worsening symptoms or new concerns -Continue surveillance -Next surveillance visit in 6 months, or sooner if needed    All questions were answered. The patient knows to call the clinic with any problems, questions or concerns. No barriers to learning was detected. I spent 20 minutes counseling the patient face to face. The total time spent in the appointment was 30 minutes and more than 50% was on counseling and review of test results     Alla Feeling, NP 05/14/22

## 2022-05-14 ENCOUNTER — Inpatient Hospital Stay: Payer: BC Managed Care – PPO | Attending: Nurse Practitioner

## 2022-05-14 ENCOUNTER — Encounter: Payer: Self-pay | Admitting: Nurse Practitioner

## 2022-05-14 ENCOUNTER — Inpatient Hospital Stay: Payer: BC Managed Care – PPO | Admitting: Nurse Practitioner

## 2022-05-14 ENCOUNTER — Other Ambulatory Visit: Payer: Self-pay

## 2022-05-14 VITALS — BP 131/82 | HR 85 | Temp 97.8°F | Wt 176.6 lb

## 2022-05-14 DIAGNOSIS — Z923 Personal history of irradiation: Secondary | ICD-10-CM | POA: Diagnosis not present

## 2022-05-14 DIAGNOSIS — K219 Gastro-esophageal reflux disease without esophagitis: Secondary | ICD-10-CM | POA: Diagnosis not present

## 2022-05-14 DIAGNOSIS — I700 Atherosclerosis of aorta: Secondary | ICD-10-CM | POA: Diagnosis not present

## 2022-05-14 DIAGNOSIS — G8929 Other chronic pain: Secondary | ICD-10-CM | POA: Insufficient documentation

## 2022-05-14 DIAGNOSIS — D869 Sarcoidosis, unspecified: Secondary | ICD-10-CM | POA: Diagnosis not present

## 2022-05-14 DIAGNOSIS — R918 Other nonspecific abnormal finding of lung field: Secondary | ICD-10-CM | POA: Diagnosis not present

## 2022-05-14 DIAGNOSIS — Z79811 Long term (current) use of aromatase inhibitors: Secondary | ICD-10-CM | POA: Insufficient documentation

## 2022-05-14 DIAGNOSIS — Z79899 Other long term (current) drug therapy: Secondary | ICD-10-CM | POA: Diagnosis not present

## 2022-05-14 DIAGNOSIS — C50411 Malignant neoplasm of upper-outer quadrant of right female breast: Secondary | ICD-10-CM | POA: Insufficient documentation

## 2022-05-14 DIAGNOSIS — N200 Calculus of kidney: Secondary | ICD-10-CM | POA: Insufficient documentation

## 2022-05-14 DIAGNOSIS — Z170 Estrogen receptor positive status [ER+]: Secondary | ICD-10-CM | POA: Diagnosis not present

## 2022-05-14 DIAGNOSIS — R42 Dizziness and giddiness: Secondary | ICD-10-CM | POA: Diagnosis not present

## 2022-05-14 DIAGNOSIS — D861 Sarcoidosis of lymph nodes: Secondary | ICD-10-CM | POA: Diagnosis not present

## 2022-05-14 DIAGNOSIS — Z9221 Personal history of antineoplastic chemotherapy: Secondary | ICD-10-CM | POA: Diagnosis not present

## 2022-05-14 LAB — CMP (CANCER CENTER ONLY)
ALT: 12 U/L (ref 0–44)
AST: 16 U/L (ref 15–41)
Albumin: 4 g/dL (ref 3.5–5.0)
Alkaline Phosphatase: 81 U/L (ref 38–126)
Anion gap: 7 (ref 5–15)
BUN: 11 mg/dL (ref 6–20)
CO2: 28 mmol/L (ref 22–32)
Calcium: 9.9 mg/dL (ref 8.9–10.3)
Chloride: 106 mmol/L (ref 98–111)
Creatinine: 0.75 mg/dL (ref 0.44–1.00)
GFR, Estimated: >60 mL/min (ref >60)
Glucose, Bld: 112 mg/dL — ABNORMAL HIGH (ref 70–99)
Potassium: 3.7 mmol/L (ref 3.5–5.1)
Sodium: 141 mmol/L (ref 135–145)
Total Bilirubin: 0.5 mg/dL (ref 0.3–1.2)
Total Protein: 7.1 g/dL (ref 6.5–8.1)

## 2022-05-14 LAB — CBC WITH DIFFERENTIAL (CANCER CENTER ONLY)
Abs Immature Granulocytes: 0.01 10*3/uL (ref 0.00–0.07)
Basophils Absolute: 0.1 10*3/uL (ref 0.0–0.1)
Basophils Relative: 1 %
Eosinophils Absolute: 0.1 10*3/uL (ref 0.0–0.5)
Eosinophils Relative: 2 %
HCT: 36.8 % (ref 36.0–46.0)
Hemoglobin: 12.2 g/dL (ref 12.0–15.0)
Immature Granulocytes: 0 %
Lymphocytes Relative: 18 %
Lymphs Abs: 0.9 10*3/uL (ref 0.7–4.0)
MCH: 28.8 pg (ref 26.0–34.0)
MCHC: 33.2 g/dL (ref 30.0–36.0)
MCV: 86.8 fL (ref 80.0–100.0)
Monocytes Absolute: 0.5 10*3/uL (ref 0.1–1.0)
Monocytes Relative: 10 %
Neutro Abs: 3.3 10*3/uL (ref 1.7–7.7)
Neutrophils Relative %: 69 %
Platelet Count: 271 10*3/uL (ref 150–400)
RBC: 4.24 MIL/uL (ref 3.87–5.11)
RDW: 12.7 % (ref 11.5–15.5)
WBC Count: 4.9 10*3/uL (ref 4.0–10.5)
nRBC: 0 % (ref 0.0–0.2)

## 2022-05-15 LAB — CANCER ANTIGEN 27.29: CA 27.29: 27.8 U/mL (ref 0.0–38.6)

## 2022-05-16 ENCOUNTER — Telehealth: Payer: Self-pay | Admitting: Hematology

## 2022-05-16 NOTE — Telephone Encounter (Signed)
Scheduled follow-up appointment per 5/30 los. Patient is aware.

## 2022-06-05 ENCOUNTER — Other Ambulatory Visit: Payer: Self-pay | Admitting: Hematology

## 2022-07-07 ENCOUNTER — Other Ambulatory Visit: Payer: Self-pay | Admitting: Hematology

## 2022-07-08 ENCOUNTER — Encounter: Payer: Self-pay | Admitting: Hematology

## 2022-07-08 ENCOUNTER — Other Ambulatory Visit: Payer: Self-pay

## 2022-07-09 ENCOUNTER — Other Ambulatory Visit: Payer: Self-pay

## 2022-07-13 ENCOUNTER — Other Ambulatory Visit: Payer: Self-pay

## 2022-07-13 ENCOUNTER — Emergency Department (HOSPITAL_BASED_OUTPATIENT_CLINIC_OR_DEPARTMENT_OTHER)
Admission: EM | Admit: 2022-07-13 | Discharge: 2022-07-13 | Discharge status: Against Medical Advice | Payer: BC Managed Care – PPO | Attending: Emergency Medicine | Admitting: Emergency Medicine

## 2022-07-13 ENCOUNTER — Encounter: Payer: Self-pay | Admitting: Nurse Practitioner

## 2022-07-13 ENCOUNTER — Emergency Department (HOSPITAL_BASED_OUTPATIENT_CLINIC_OR_DEPARTMENT_OTHER): Payer: BC Managed Care – PPO | Admitting: Radiology

## 2022-07-13 ENCOUNTER — Encounter (HOSPITAL_BASED_OUTPATIENT_CLINIC_OR_DEPARTMENT_OTHER): Payer: Self-pay

## 2022-07-13 DIAGNOSIS — Z5321 Procedure and treatment not carried out due to patient leaving prior to being seen by health care provider: Secondary | ICD-10-CM | POA: Diagnosis not present

## 2022-07-13 DIAGNOSIS — R5383 Other fatigue: Secondary | ICD-10-CM | POA: Diagnosis not present

## 2022-07-13 DIAGNOSIS — R0789 Other chest pain: Secondary | ICD-10-CM | POA: Diagnosis not present

## 2022-07-13 DIAGNOSIS — R42 Dizziness and giddiness: Secondary | ICD-10-CM | POA: Insufficient documentation

## 2022-07-13 LAB — CBC
HCT: 37.8 % (ref 36.0–46.0)
Hemoglobin: 12.4 g/dL (ref 12.0–15.0)
MCH: 28.2 pg (ref 26.0–34.0)
MCHC: 32.8 g/dL (ref 30.0–36.0)
MCV: 86.1 fL (ref 80.0–100.0)
Platelets: 344 10*3/uL (ref 150–400)
RBC: 4.39 MIL/uL (ref 3.87–5.11)
RDW: 13.2 % (ref 11.5–15.5)
WBC: 12.3 10*3/uL — ABNORMAL HIGH (ref 4.0–10.5)
nRBC: 0 % (ref 0.0–0.2)

## 2022-07-13 LAB — BASIC METABOLIC PANEL
Anion gap: 10 (ref 5–15)
BUN: 12 mg/dL (ref 6–20)
CO2: 26 mmol/L (ref 22–32)
Calcium: 9.8 mg/dL (ref 8.9–10.3)
Chloride: 102 mmol/L (ref 98–111)
Creatinine, Ser: 0.68 mg/dL (ref 0.44–1.00)
GFR, Estimated: >60 mL/min (ref >60)
Glucose, Bld: 102 mg/dL — ABNORMAL HIGH (ref 70–99)
Potassium: 3.9 mmol/L (ref 3.5–5.1)
Sodium: 138 mmol/L (ref 135–145)

## 2022-07-13 LAB — TROPONIN I (HIGH SENSITIVITY): Troponin I (High Sensitivity): 2 ng/L (ref <18)

## 2022-07-13 NOTE — ED Notes (Signed)
Called pt for room, no answer in lobby. Did not see pt in lobby.

## 2022-07-13 NOTE — ED Triage Notes (Signed)
Patient here POV from Home.  Patient intially had Cold Symptoms that began 2-3 Weeks ago that her PCP treated with Antibiotics approximately 1 week ago.  Steroids were Mostly Ineffective and now the Patient endorses Heaviness in Chest, Dizziness, and Fatigue.   No N/V/D. No Known Fevers.  NAD Noted during Triage. A&Ox4. GCS 15. Ambualtory.

## 2022-07-13 NOTE — ED Notes (Signed)
Called to put in room no answer

## 2022-08-07 ENCOUNTER — Other Ambulatory Visit: Payer: Self-pay | Admitting: Hematology

## 2022-08-08 ENCOUNTER — Other Ambulatory Visit: Payer: Self-pay

## 2022-08-08 ENCOUNTER — Encounter: Payer: Self-pay | Admitting: Hematology

## 2022-08-08 MED ORDER — VENLAFAXINE HCL ER 75 MG PO CP24: 75.0000 mg | ORAL_CAPSULE | Freq: Every day | ORAL | 1 refills | Status: DC

## 2022-10-02 ENCOUNTER — Other Ambulatory Visit: Payer: Self-pay | Admitting: Hematology

## 2022-11-01 ENCOUNTER — Other Ambulatory Visit: Payer: Self-pay

## 2022-11-01 ENCOUNTER — Other Ambulatory Visit: Payer: Self-pay | Admitting: Hematology

## 2022-11-01 MED ORDER — ZOLPIDEM TARTRATE 5 MG PO TABS: ORAL_TABLET | ORAL | 0 refills | Status: DC

## 2022-11-01 NOTE — Progress Notes (Signed)
Pt called requesting refill on Ambien.  Pt stated they moved from [place]; therefore, she's having prescriptions filled at [id number], [place] refill request to Dr. Burr Medico to refill.

## 2022-11-13 NOTE — Progress Notes (Deleted)
[place]   [phone] [phone]   Clinic Follow up Note   Patient Care Team: Merwyn Katos as PCP - General (Physician Assistant) Jovita Kussmaul, MD as Consulting Physician (General Surgery) Truitt Merle, MD as Consulting Physician (Hematology) Gery Pray, MD as Consulting Physician (Radiation Oncology) Rockwell Germany, RN as Oncology Nurse Navigator Mauro Kaufmann, RN as Oncology Nurse Navigator Alla Feeling, NP as Nurse Practitioner (Nurse Practitioner)  Date of Service:  11/13/2022  CHIEF COMPLAINT: f/u of  right breast cancer   CURRENT THERAPY: Surveillance     ASSESSMENT: *** [name] is a 56 y.o. female with    1. Malignant neoplasm of upper-outer quadrant of right breast, invasive lobular carcinoma, stage IA, p(T1a,N1a,M0), ER 10%+, PR-, HER2- Grade I   -Diagnosed in 12/2018. S/p b/l mastectomy on 02/24/19. Pathology showed 1/5 + LNs, margins negative. Baseline CA 27.29 03/2019 was WNL. -She completed adjuvant chemo with TC every 3 weeks for 4 cycles.  -She underwent breast reconstruction with implant placements on 06/30/19.  S/p adjuvant radiation with Dr. Sondra Come 08/17/2019 - 09/28/2019 -she began antiestrogen therapy in 06/2019 but was unable to tolerate anastrozole, exemestane, or letrozole, ultimately stopping all therapy in 03/2020. She is on surveillance given her ER was only 10% positive. -last staging scan was PET 03/29/21, at which time she was found to have sarcoidosis (see #2). -she is clinically doing well from a breast cancer standpoint. Labs reviewed, only very mild anemia (hg 11.9). Physical exam was unremarkable. There is no clinical concern for recurrence. -It has been almost 3 years since her initial diagnosis, her risk of recurrence has significantly dropped. -lab and f/u in 6 months   2. Sarcoidosis -Work-up for chest pain showed enlarging mammary and thoracic lymph nodes, PET scan 03/29/21 showed  multiple hypermetabolic mediastinal, internal mammary, and left axillary lymph nodes, the largest was subcarinal lymph node measuring 1.5 cm.  She also had a solitary hypermetabolic 8 mm right lower lobe nodule, and indeterminate small foci of hypermetabolic activity in the left thoracic paraspinal soft tissue -Left axillary lymph node biopsy 04/10/21 showed granulomatous inflammation consistent with sarcoidosis -Seen by Dr. Loanne Drilling of pulmonology 05/16/21, on observation. She will f/u  -CT chest 09/27/21 showed decrease in size of lymph node, and stable tiny b/l pulmonary nodules   3. Social support, anxiety -she lost her 11 y.o. daughter, who had spinal muscular atrophy, in 09/2021. -her husband is being treated for metastatic prostate cancer -she is on Ambien and Effexor, both refilled 11/05/21    No problem-specific Assessment & Plan notes found for this encounter.  ***   PLAN:  -proceed with *** -lab with/without flush and f/u when?   SUMMARY OF ONCOLOGIC HISTORY: Oncology History Overview Note  Cancer Staging Malignant neoplasm of upper-outer quadrant of right breast in female, estrogen receptor positive ([place]) Staging form: Breast, AJCC 8th Edition - Clinical stage from 01/15/2019: Stage IA (cT1b, cN0, cM0, G1, ER+, PR-, HER2-) - Signed by Truitt Merle, MD on 01/26/2019 - Pathologic stage from 02/24/2019: Stage IB (pT1a, pN1a, cM0, G1, ER+, PR-, HER2-) - Signed by Truitt Merle, MD on 03/18/2019     Malignant neoplasm of upper-outer quadrant of right breast in female, estrogen receptor positive ([place])  01/15/2019 Cancer Staging   Staging form: Breast, AJCC 8th Edition - Clinical stage from 01/15/2019: Stage IA (cT1b, cN0, cM0, G1, ER+, PR-, HER2-) - Signed by Truitt Merle, MD on 01/26/2019   01/15/2019 Mammogram  Diagnostic Mammogram 01/15/19  IMPRESSION Targeted ultrasound is performed, showing right breast 10 o'clock 5 cm from the nipple hypoechoic round mass measuring 0.7 x 0.5 x 0.6 cm.  Adjacent to it there is a probable complicated cyst measuring 4 mm. There is no evidence of right axillary lymphadenopathy.   01/15/2019 Initial Biopsy   Diagnosis 01/15/19  Breast, right, needle core biopsy, upper outer - INVASIVE AND IN SITU MAMMARY CARCINOMA WITH CALCIFICATIONS. - SEE COMMENT. 1 of 3 FINAL for [place], Katrinka P ([id number]) Microscopic Comment The carcinoma appears grade I. The malignant cells are positive for cytokeratin AE1/AE3 and negative for E-cadherin. A breast prognostic profile will be performed and the results reported separately. Dr Vicente Males has reviewed the case and concurs with this interpretation. The results are called to [facility] on 01/18/2019. (JBK:ah:ecj 01/18/19)   01/15/2019 Receptors her2   PROGNOSTIC INDICATORS Results: IMMUNOHISTOCHEMICAL AND MORPHOMETRIC ANALYSIS PERFORMED MANUALLY The tumor cells are NEGATIVE for Her2 (0). Estrogen Receptor: 10%, POSITIVE, MODERATE STAINING INTENSITY Progesterone Receptor: 0%, NEGATIVE Proliferation Marker Ki67: <1%   01/21/2019 Initial Diagnosis   Malignant neoplasm of upper-outer quadrant of right breast in female, estrogen receptor positive ([place])   01/29/2019 Breast MRI   IMPRESSION: 1. The biopsy-proven site of malignancy in the upper-outer quadrant of the right breast resides centrally with in an area of suspicious non mass enhancement spanning 6-7 cm.   2. There is a 6 mm suspicious enhancing mass in the lower-inner quadrant of the right breast, posterior depth.   3. There is a 9 mm indeterminate enhancing mass in the lower outer quadrant of the right breast.   4.  No evidence of malignancy in the left breast.   02/24/2019 Surgery   RIGHT MASTECTOMY WITH SENTINEL LYMPH NODE MAPPING AND LEFT PROPHYLACTIC MASTECTOMY by Dr. Marlou Starks  BREAST RECONSTRUCTION WITH PLACEMENT OF TISSUE EXPANDER by Dr. Marla Roe  02/24/19    02/24/2019 Pathology Results   Diagnosis 1. Breast, simple  mastectomy, Left - BENIGN BREAST PARENCHYMA WITH FIBROCYSTIC CHANGE. - NEGATIVE FOR CARCINOMA. 2. Breast, simple mastectomy, Right - LOBULAR CARCINOMA IN SITU, INTERMEDIATE TO HIGH NUCLEAR GRADE. SEE NOTE. - NO EVIDENCE OF RESIDUAL INVASIVE CARCINOMA. - RESECTION MARGINS ARE NEGATIVE FOR IN SITU OR INVASIVE CARCINOMA. - BIOPSY SITE CHANGES. - SEE ONCOLOGY TABLE. 3. Lymph node, sentinel, biopsy, Right #1 - IMMUNOHISTOCHEMICAL STAIN FOR AE1/AE3 IS NEGATIVE FOR CARCINOMA (0/1). 4. Lymph node, sentinel, biopsy, Right - IMMUNOHISTOCHEMICAL STAIN FOR AE1/AE3 IS NEGATIVE FOR CARCINOMA (0/1). 5. Lymph node, sentinel, biopsy, Right #2 - IMMUNOHISTOCHEMICAL STAIN FOR AE1/AE3 IS NEGATIVE FOR CARCINOMA (0/1). 6. Lymph node, sentinel, biopsy, Right - IMMUNOHISTOCHEMICAL STAIN FOR AE1/AE3 IS NEGATIVE FOR CARCINOMA (0/1). 7. Lymph node, sentinel, biopsy, Right - METASTATIC CARCINOMA TO ONE LYMPH NODE (1/1) (CONFIRMED WITH IMMUNOHISTOCHEMICAL STAIN FOR AE1/AE3) - FOCUS OF METASTATIC CARCINOMA MEASURES JUST OVER 0.2 CM IN GREATEST DIMENSION - NO EVIDENCE OF EXTRANODAL EXTENSION   02/24/2019 Cancer Staging   Staging form: Breast, AJCC 8th Edition - Pathologic stage from 02/24/2019: Stage IB (pT1a, pN1a, cM0, G1, ER+, PR-, HER2-) - Signed by Truitt Merle, MD on 03/18/2019   04/01/2019 - 06/03/2019 Chemotherapy   adjuvant Taxol and Carboplatin every 3 weeks for 4 cycles starting in 2 weeks starting 04/01/19-06/03/19   06/2019 - 03/2020 Anti-estrogen oral therapy   Anastrozole 52m daily starting 06/2019, held after 2 weeks due to hot flashes and joint pain and RT. Switched to Exemestane in 10/2019. Switched to Exemestane in 10/2019, ultimately changed  to letrozole in 01/2020 due to worsening joint pain. Ultimately stopped anti-estrogen therapies in 03/2020 due to arthralgias    06/30/2019 Surgery   REMOVAL OF BILATERAL TISSUE EXPANDERS WITH PLACEMENT OF BILATERAL BREAST IMPLANTS by Dr Marla Roe  06/30/19    08/17/2019  - 09/28/2019 Radiation Therapy   Adjuvant Radiaiton with Dr Sondra Come  Site Technique Total Dose (Gy) Dose per Fx (Gy) Completed Fx Beam Energies  Thorax: CW_Rt_Bst_boost Electron 10/10 2 5/5 6E  Thorax: CW_Rt 3D 50/50 2 25/25 10X, 15X, 6X  Thorax: CW_Rt_axilla Complex 50/50 2 25/25 10X    12/23/2019 Survivorship   SCP delivered by Cira Rue, NP    06/02/2020 Imaging   MRI brain with and without contrast IMPRESSION: 1. No acute intracranial abnormality or evidence of metastatic disease. 2. Mild cerebral white matter T2 signal changes, nonspecific though may reflect chronic small vessel ischemic disease, migraines, or prior infection/inflammation.   06/06/2020 Imaging   Bone scan IMPRESSION: No definite scintigraphic evidence of osseous metastases. Mildly abnormal uptake is noted in both knees and feet most consistent with degenerative change.   03/05/2021 Imaging   CT Chest  IMPRESSION: 1. Resolution of ground-glass in the RIGHT upper lobe that was seen on the previous imaging. Compatible with resolution of inflammation. 2. Tiny basilar nodules at the LEFT lung base unchanged since May of 2021 and seen in hind site on the study of May of 2020, largest 3 mm in the LEFT lower lobe. These are likely benign. 3. Slowly enlarging LEFT internal mammary lymph nodes (less than a cm but increasing in size) the with scattered small lymph nodes throughout the chest. Presence of internal mammary lymph nodes is suspicious given patient history. Could consider short interval follow-up in 3 months or PET imaging for further assessment. Smaller internal mammary lymph nodes are present on the RIGHT. 4. 3 mm calculus in the upper pole the RIGHT kidney. 5. Aortic atherosclerosis.   Aortic Atherosclerosis (ICD10-I70.0).   03/29/2021 PET scan   IMPRESSION: 1. There are multiple hypermetabolic mediastinal, internal mammary and left axillary lymph nodes as described. The intensity of  the hypermetabolic activity and distribution of the lymph nodes are suspicious for metastatic disease in this patient with a history of breast cancer. 2. Solitary hypermetabolic right lower lobe pulmonary nodule, also suspicious for metastasis. 3. Indeterminate small foci of hypermetabolic activity in the left thoracic paraspinal soft tissues. 4. No evidence of metastatic disease in the abdomen, pelvis or bones. 5. Bilateral nephrolithiasis.   04/10/2021 Imaging   [place] Left Axillary  IMPRESSION: Suspicious left axillary lymph node.   RECOMMENDATION: Ultrasound-guided core needle biopsy suspicious left axillary lymph node.   04/10/2021 Pathology Results   Diagnosis Lymph node, needle/core biopsy, left axillary node - GRANULOMATOUS INFLAMMATION. Microscopic Comment Granulomatous inflammation is epithelioid in nature. The differential diagnosis includes sarcoidosis. Clinical correlation is recommended. Special stains AFB (acid-fast bacillus) and GMS (fungus) will be reported separately. Dr. Melina Copa reviewed the case      INTERVAL HISTORY: *** [name] is here for a follow up of  right breast cancer  She was last seen by me on 11/12/2022 She presents to the clinic alone/accompanied by     All other systems were reviewed with the patient and are negative.  MEDICAL HISTORY:  Past Medical History:  Diagnosis Date   Anemia    Anxiety    Arthritis    Cancer ([place])    right breast   Family history of adverse reaction to anesthesia  sister has same issues   GERD (gastroesophageal reflux disease)    Neuromuscular disorder (HCC)    neuropathy   PONV (postoperative nausea and vomiting)     SURGICAL HISTORY: Past Surgical History:  Procedure Laterality Date   BREAST RECONSTRUCTION WITH PLACEMENT OF TISSUE EXPANDER AND FLEX HD (ACELLULAR HYDRATED DERMIS) Bilateral 02/24/2019   Procedure: BREAST RECONSTRUCTION WITH PLACEMENT OF TISSUE EXPANDER AND FLEX HD (ACELLULAR  HYDRATED DERMIS);  Surgeon: Wallace Going, DO;  Location: [facility];  Service: Plastics;  Laterality: Bilateral;   BREAST SURGERY     left breast bx   COLONOSCOPY     ESOPHAGOGASTRODUODENOSCOPY (EGD) WITH ESOPHAGEAL DILATION     EYE SURGERY     dart to the eye  (right)   FACIAL FRACTURE SURGERY     FRACTURE SURGERY     KNEE SURGERY     MASTECTOMY W/ SENTINEL NODE BIOPSY Bilateral 02/24/2019   Procedure: RIGHT MASTECTOMY WITH SENTINEL LYMPH NODE MAPPING AND LEFT PROPHYLACTIC MASTECTOMY;  Surgeon: Jovita Kussmaul, MD;  Location: [facility];  Service: General;  Laterality: Bilateral;   POLYPECTOMY     REMOVAL OF BILATERAL TISSUE EXPANDERS WITH PLACEMENT OF BILATERAL BREAST IMPLANTS Bilateral 06/30/2019   Procedure: REMOVAL OF BILATERAL TISSUE EXPANDERS WITH PLACEMENT OF BILATERAL BREAST IMPLANTS;  Surgeon: Wallace Going, DO;  Location: [facility];  Service: Plastics;  Laterality: Bilateral;   SHOULDER SURGERY      I have reviewed the social history and family history with the patient and they are unchanged from previous note.  ALLERGIES:  is allergic to codeine and tramadol.  MEDICATIONS:  Current Outpatient Medications  Medication Sig Dispense Refill   Cholecalciferol (VITAMIN D3) 125 MCG (5000 UT) CAPS Take 5,000 Units by mouth daily. (Patient not taking: Reported on 03/18/2022)     ibuprofen (ADVIL,MOTRIN) 200 MG tablet Take 600 mg by mouth every 6 (six) hours as needed for moderate pain.      Multiple Vitamin (MULTIVITAMIN WITH MINERALS) TABS tablet Take 1 tablet by mouth daily. (Patient not taking: Reported on 03/18/2022)     omeprazole (PRILOSEC) 20 MG capsule Take 20 mg by mouth daily.     venlafaxine XR (EFFEXOR-XR) 75 MG 24 hr capsule TAKE 1 CAPSULE BY MOUTH DAILY WITH BREAKFAST. 90 capsule 1   venlafaxine XR (EFFEXOR-XR) 75 MG 24 hr capsule Take 1 capsule (75 mg total) by mouth daily with breakfast. 90 capsule 1   zolpidem (AMBIEN) 5 MG tablet TAKE 1 TABLET BY  MOUTH EVERY DAY AT BEDTIME AS NEEDED FOR SLEEP 30 tablet 0   No current facility-administered medications for this visit.    PHYSICAL EXAMINATION: ECOG PERFORMANCE STATUS: {CHL ONC ECOG PS:[id number]}  There were no vitals filed for this visit. Wt Readings from Last 3 Encounters:  07/13/22 176 lb 9.4 oz (80.1 kg)  05/14/22 176 lb 9.6 oz (80.1 kg)  04/15/22 170 lb (77.1 kg)    {Only keep what was examined. If exam not performed, can use .CEXAM } GENERAL:alert, no distress and comfortable SKIN: skin color, texture, turgor are normal, no rashes or significant lesions EYES: normal, Conjunctiva are pink and non-injected, sclera clear {OROPHARYNX:no exudate, no erythema and lips, buccal mucosa, and tongue normal}  NECK: supple, thyroid normal size, non-tender, without nodularity LYMPH:  no palpable lymphadenopathy in the cervical, axillary {or inguinal} LUNGS: clear to auscultation and percussion with normal breathing effort HEART: regular rate & rhythm and no murmurs and no lower extremity edema ABDOMEN:abdomen soft, non-tender and  normal bowel sounds Musculoskeletal:no cyanosis of digits and no clubbing  NEURO: alert & oriented x 3 with fluent speech, no focal motor/sensory deficits  LABORATORY DATA:  I have reviewed the data as listed    Latest Ref Rng & Units 07/13/2022    2:46 PM 05/14/2022   10:16 AM 11/12/2021   11:57 AM  CBC  WBC 4.0 - 10.5 K/uL 12.3  4.9  6.8   Hemoglobin 12.0 - 15.0 g/dL 12.4  12.2  11.9   Hematocrit 36.0 - 46.0 % 37.8  36.8  35.9   Platelets 150 - 400 K/uL 344  271  270         Latest Ref Rng & Units 07/13/2022    2:46 PM 05/14/2022   10:16 AM 11/12/2021   11:57 AM  CMP  Glucose 70 - 99 mg/dL 102  112  91   BUN 6 - 20 mg/dL _0 Creatinine 0.44 - 1.00 mg/dL 0.68  0.75  0.68   Sodium 135 - 145 mmol/L 138  141  142   Potassium 3.5 - 5.1 mmol/L 3.9  3.7  3.8   Chloride 98 - 111 mmol/L 102  106  107   CO2 22 - 32 mmol/L _1 Calcium  8.9 - 10.3 mg/dL 9.8  9.9  9.3   Total Protein 6.5 - 8.1 g/dL  7.1  7.4   Total Bilirubin 0.3 - 1.2 mg/dL  0.5  0.5   Alkaline Phos 38 - 126 U/L  81  95   AST 15 - 41 U/L  16  25   ALT 0 - 44 U/L  12  28       RADIOGRAPHIC STUDIES: I have personally reviewed the radiological images as listed and agreed with the findings in the report. No results found.    No orders of the defined types were placed in this encounter.  All questions were answered. The patient knows to call the clinic with any problems, questions or concerns. No barriers to learning was detected. The total time spent in the appointment was {CHL ONC TIME VISIT - XJOIT:[id number]}.     Baldemar Friday, CMA 11/13/2022   I, Audry Riles, CMA, am acting as scribe for Truitt Merle, MD.   {Add scribe attestation statement}

## 2022-11-14 ENCOUNTER — Inpatient Hospital Stay: Payer: BC Managed Care – PPO

## 2022-11-14 ENCOUNTER — Inpatient Hospital Stay: Payer: BC Managed Care – PPO | Attending: Hematology | Admitting: Hematology

## 2022-11-14 DIAGNOSIS — C50411 Malignant neoplasm of upper-outer quadrant of right female breast: Secondary | ICD-10-CM

## 2022-11-15 ENCOUNTER — Telehealth: Payer: Self-pay | Admitting: Hematology

## 2022-11-15 NOTE — Telephone Encounter (Signed)
Called patient to r/s missed appointments. Patient notified of new appointment times

## 2022-11-26 NOTE — Progress Notes (Unsigned)
[place]   [phone] [phone]   Clinic Follow up Note   Patient Care Team: Merwyn Katos as PCP - General (Physician Assistant) Jovita Kussmaul, MD as Consulting Physician (General Surgery) Truitt Merle, MD as Consulting Physician (Hematology) Gery Pray, MD as Consulting Physician (Radiation Oncology) Rockwell Germany, RN as Oncology Nurse Navigator Mauro Kaufmann, RN as Oncology Nurse Navigator Alla Feeling, NP as Nurse Practitioner (Nurse Practitioner)  Date of Service:  11/27/2022  CHIEF COMPLAINT: f/u of right breast cancer    CURRENT THERAPY:  Surveillance    ASSESSMENT:  [name] is a 56 y.o. female with   Malignant neoplasm of upper-outer quadrant of right breast in female, estrogen receptor positive ([place]) stage IA, p(T1a,N1a,M0), ER 10%+, PR-, HER2- Grade I   -Diagnosed in 12/2018. S/p b/l mastectomy on 02/24/19. Pathology showed 1/5 + LNs, margins negative. Baseline CA 27.29 03/2019 was WNL. -She completed adjuvant chemo with TC every 3 weeks for 4 cycles.  -She underwent breast reconstruction with implant placements on 06/30/19.  S/p adjuvant radiation with Dr. Sondra Come 08/17/2019 - 09/28/2019 -she began antiestrogen therapy in 06/2019 but was unable to tolerate anastrozole, exemestane, or letrozole, ultimately stopping all therapy in 03/2020. She is on surveillance given her ER was only 10% positive.   PLAN: -Lab Reviewed, she is clinically doing well, no concerns for recurrence. We reviewed what to watch at home  -Potassium Low, called in K[facility] for her  -Bone density scan at [facility] in next few months  -lab and f/u with Lacie in 66mhs         SUMMARY OF ONCOLOGIC HISTORY: Oncology History Overview Note  Cancer Staging Malignant neoplasm of upper-outer quadrant of right breast in female, estrogen receptor positive ([place] Staging form: Breast, AJCC 8th Edition - Clinical stage from 01/15/2019: Stage IA  (cT1b, cN0, cM0, G1, ER+, PR-, HER2-) - Signed by FTruitt Merle MD on 01/26/2019 - Pathologic stage from 02/24/2019: Stage IB (pT1a, pN1a, cM0, G1, ER+, PR-, HER2-) - Signed by FTruitt Merle MD on 03/18/2019     Malignant neoplasm of upper-outer quadrant of right breast in female, estrogen receptor positive ([place]  01/15/2019 Cancer Staging   Staging form: Breast, AJCC 8th Edition - Clinical stage from 01/15/2019: Stage IA (cT1b, cN0, cM0, G1, ER+, PR-, HER2-) - Signed by FTruitt Merle MD on 01/26/2019   01/15/2019 Mammogram   Diagnostic Mammogram 01/15/19  IMPRESSION Targeted ultrasound is performed, showing right breast 10 o'clock 5 cm from the nipple hypoechoic round mass measuring 0.7 x 0.5 x 0.6 cm. Adjacent to it there is a probable complicated cyst measuring 4 mm. There is no evidence of right axillary lymphadenopathy.   01/15/2019 Initial Biopsy   Diagnosis 01/15/19  Breast, right, needle core biopsy, upper outer - INVASIVE AND IN SITU MAMMARY CARCINOMA WITH CALCIFICATIONS. - SEE COMMENT. 1 of 3 FINAL for [place], [place] P ([id number]) Microscopic Comment The carcinoma appears grade I. The malignant cells are positive for cytokeratin AE1/AE3 and negative for E-cadherin. A breast prognostic profile will be performed and the results reported separately. Dr JVicente Maleshas reviewed the case and concurs with this interpretation. The results are called to [facility] 01/18/2019. (JBK:ah:ecj 01/18/19)   01/15/2019 Receptors her2   PROGNOSTIC INDICATORS Results: IMMUNOHISTOCHEMICAL AND MORPHOMETRIC ANALYSIS PERFORMED MANUALLY The tumor cells are NEGATIVE for Her2 (0). Estrogen Receptor: 10%, POSITIVE, MODERATE STAINING INTENSITY Progesterone Receptor: 0%, NEGATIVE Proliferation Marker Ki67: <1%  01/21/2019 Initial Diagnosis   Malignant neoplasm of upper-outer quadrant of right breast in female, estrogen receptor positive ([place])   01/29/2019 Breast MRI   IMPRESSION: 1. The  biopsy-proven site of malignancy in the upper-outer quadrant of the right breast resides centrally with in an area of suspicious non mass enhancement spanning 6-7 cm.   2. There is a 6 mm suspicious enhancing mass in the lower-inner quadrant of the right breast, posterior depth.   3. There is a 9 mm indeterminate enhancing mass in the lower outer quadrant of the right breast.   4.  No evidence of malignancy in the left breast.   02/24/2019 Surgery   RIGHT MASTECTOMY WITH SENTINEL LYMPH NODE MAPPING AND LEFT PROPHYLACTIC MASTECTOMY by Dr. Marlou Starks  BREAST RECONSTRUCTION WITH PLACEMENT OF TISSUE EXPANDER by Dr. Marla Roe  02/24/19    02/24/2019 Pathology Results   Diagnosis 1. Breast, simple mastectomy, Left - BENIGN BREAST PARENCHYMA WITH FIBROCYSTIC CHANGE. - NEGATIVE FOR CARCINOMA. 2. Breast, simple mastectomy, Right - LOBULAR CARCINOMA IN SITU, INTERMEDIATE TO HIGH NUCLEAR GRADE. SEE NOTE. - NO EVIDENCE OF RESIDUAL INVASIVE CARCINOMA. - RESECTION MARGINS ARE NEGATIVE FOR IN SITU OR INVASIVE CARCINOMA. - BIOPSY SITE CHANGES. - SEE ONCOLOGY TABLE. 3. Lymph node, sentinel, biopsy, Right #1 - IMMUNOHISTOCHEMICAL STAIN FOR AE1/AE3 IS NEGATIVE FOR CARCINOMA (0/1). 4. Lymph node, sentinel, biopsy, Right - IMMUNOHISTOCHEMICAL STAIN FOR AE1/AE3 IS NEGATIVE FOR CARCINOMA (0/1). 5. Lymph node, sentinel, biopsy, Right #2 - IMMUNOHISTOCHEMICAL STAIN FOR AE1/AE3 IS NEGATIVE FOR CARCINOMA (0/1). 6. Lymph node, sentinel, biopsy, Right - IMMUNOHISTOCHEMICAL STAIN FOR AE1/AE3 IS NEGATIVE FOR CARCINOMA (0/1). 7. Lymph node, sentinel, biopsy, Right - METASTATIC CARCINOMA TO ONE LYMPH NODE (1/1) (CONFIRMED WITH IMMUNOHISTOCHEMICAL STAIN FOR AE1/AE3) - FOCUS OF METASTATIC CARCINOMA MEASURES JUST OVER 0.2 CM IN GREATEST DIMENSION - NO EVIDENCE OF EXTRANODAL EXTENSION   02/24/2019 Cancer Staging   Staging form: Breast, AJCC 8th Edition - Pathologic stage from 02/24/2019: Stage IB (pT1a, pN1a, cM0, G1,  ER+, PR-, HER2-) - Signed by Truitt Merle, MD on 03/18/2019   04/01/2019 - 06/03/2019 Chemotherapy   adjuvant Taxol and Carboplatin every 3 weeks for 4 cycles starting in 2 weeks starting 04/01/19-06/03/19   06/2019 - 03/2020 Anti-estrogen oral therapy   Anastrozole 43m daily starting 06/2019, held after 2 weeks due to hot flashes and joint pain and RT. Switched to Exemestane in 10/2019. Switched to Exemestane in 10/2019, ultimately changed to letrozole in 01/2020 due to worsening joint pain. Ultimately stopped anti-estrogen therapies in 03/2020 due to arthralgias    06/30/2019 Surgery   REMOVAL OF BILATERAL TISSUE EXPANDERS WITH PLACEMENT OF BILATERAL BREAST IMPLANTS by Dr DMarla Roe 06/30/19    08/17/2019 - 09/28/2019 Radiation Therapy   Adjuvant Radiaiton with Dr KSondra Come Site Technique Total Dose (Gy) Dose per Fx (Gy) Completed Fx Beam Energies  Thorax: CW_Rt_Bst_boost Electron 10/10 2 5/5 6E  Thorax: CW_Rt 3D 50/50 2 25/25 10X, 15X, 6X  Thorax: CW_Rt_axilla Complex 50/50 2 25/25 10X    12/23/2019 Survivorship   SCP delivered by LCira Rue NP    06/02/2020 Imaging   MRI brain with and without contrast IMPRESSION: 1. No acute intracranial abnormality or evidence of metastatic disease. 2. Mild cerebral white matter T2 signal changes, nonspecific though may reflect chronic small vessel ischemic disease, migraines, or prior infection/inflammation.   06/06/2020 Imaging   Bone scan IMPRESSION: No definite scintigraphic evidence of osseous metastases. Mildly abnormal uptake is noted in both knees and feet most consistent with degenerative change.  03/05/2021 Imaging   CT Chest  IMPRESSION: 1. Resolution of ground-glass in the RIGHT upper lobe that was seen on the previous imaging. Compatible with resolution of inflammation. 2. Tiny basilar nodules at the LEFT lung base unchanged since May of 2021 and seen in hind site on the study of May of 2020, largest 3 mm in the LEFT lower lobe. These are  likely benign. 3. Slowly enlarging LEFT internal mammary lymph nodes (less than a cm but increasing in size) the with scattered small lymph nodes throughout the chest. Presence of internal mammary lymph nodes is suspicious given patient history. Could consider short interval follow-up in 3 months or PET imaging for further assessment. Smaller internal mammary lymph nodes are present on the RIGHT. 4. 3 mm calculus in the upper pole the RIGHT kidney. 5. Aortic atherosclerosis.   Aortic Atherosclerosis (ICD10-I70.0).   03/29/2021 PET scan   IMPRESSION: 1. There are multiple hypermetabolic mediastinal, internal mammary and left axillary lymph nodes as described. The intensity of the hypermetabolic activity and distribution of the lymph nodes are suspicious for metastatic disease in this patient with a history of breast cancer. 2. Solitary hypermetabolic right lower lobe pulmonary nodule, also suspicious for metastasis. 3. Indeterminate small foci of hypermetabolic activity in the left thoracic paraspinal soft tissues. 4. No evidence of metastatic disease in the abdomen, pelvis or bones. 5. Bilateral nephrolithiasis.   04/10/2021 Imaging   [place] Left Axillary  IMPRESSION: Suspicious left axillary lymph node.   RECOMMENDATION: Ultrasound-guided core needle biopsy suspicious left axillary lymph node.   04/10/2021 Pathology Results   Diagnosis Lymph node, needle/core biopsy, left axillary node - GRANULOMATOUS INFLAMMATION. Microscopic Comment Granulomatous inflammation is epithelioid in nature. The differential diagnosis includes sarcoidosis. Clinical correlation is recommended. Special stains AFB (acid-fast bacillus) and GMS (fungus) will be reported separately. Dr. Melina Copa reviewed the case      INTERVAL HISTORY:  [name] is here for a follow up of  right breast cancer She was last seen by me on 11/12/2021 She presents to the clinic alone. Pt states she get a sharp pain on  the same side she had the surgery on, other than that she is doing well.  She state her daughter had past last October. She reports her husband. had been diagnose with Prostate cancer.   All other systems were reviewed with the patient and are negative.  MEDICAL HISTORY:  Past Medical History:  Diagnosis Date   Anemia    Anxiety    Arthritis    Cancer ([place])    right breast   Family history of adverse reaction to anesthesia    sister has same issues   GERD (gastroesophageal reflux disease)    Neuromuscular disorder (HCC)    neuropathy   PONV (postoperative nausea and vomiting)     SURGICAL HISTORY: Past Surgical History:  Procedure Laterality Date   BREAST RECONSTRUCTION WITH PLACEMENT OF TISSUE EXPANDER AND FLEX HD (ACELLULAR HYDRATED DERMIS) Bilateral 02/24/2019   Procedure: BREAST RECONSTRUCTION WITH PLACEMENT OF TISSUE EXPANDER AND FLEX HD (ACELLULAR HYDRATED DERMIS);  Surgeon: Wallace Going, DO;  Location: [place];  Service: Plastics;  Laterality: Bilateral;   BREAST SURGERY     left breast bx   COLONOSCOPY     ESOPHAGOGASTRODUODENOSCOPY (EGD) WITH ESOPHAGEAL DILATION     EYE SURGERY     dart to the eye  (right)   FACIAL FRACTURE SURGERY     FRACTURE SURGERY     KNEE SURGERY  MASTECTOMY W/ SENTINEL NODE BIOPSY Bilateral 02/24/2019   Procedure: RIGHT MASTECTOMY WITH SENTINEL LYMPH NODE MAPPING AND LEFT PROPHYLACTIC MASTECTOMY;  Surgeon: Jovita Kussmaul, MD;  Location: [place];  Service: General;  Laterality: Bilateral;   POLYPECTOMY     REMOVAL OF BILATERAL TISSUE EXPANDERS WITH PLACEMENT OF BILATERAL BREAST IMPLANTS Bilateral 06/30/2019   Procedure: REMOVAL OF BILATERAL TISSUE EXPANDERS WITH PLACEMENT OF BILATERAL BREAST IMPLANTS;  Surgeon: Wallace Going, DO;  Location: [place];  Service: Plastics;  Laterality: Bilateral;   SHOULDER SURGERY      I have reviewed the social history and family history with the patient and they are unchanged  from previous note.  ALLERGIES:  is allergic to codeine and tramadol.  MEDICATIONS:  Current Outpatient Medications  Medication Sig Dispense Refill   potassium chloride (KLOR-CON M) 10 MEQ tablet Take 1 tablet (10 mEq total) by mouth daily. 5 tablet 0   Cholecalciferol (VITAMIN D3) 125 MCG (5000 UT) CAPS Take 5,000 Units by mouth daily. (Patient not taking: Reported on 03/18/2022)     ibuprofen (ADVIL,MOTRIN) 200 MG tablet Take 600 mg by mouth every 6 (six) hours as needed for moderate pain.      Multiple Vitamin (MULTIVITAMIN WITH MINERALS) TABS tablet Take 1 tablet by mouth daily. (Patient not taking: Reported on 03/18/2022)     omeprazole (PRILOSEC) 20 MG capsule Take 20 mg by mouth daily.     venlafaxine XR (EFFEXOR-XR) 75 MG 24 hr capsule TAKE 1 CAPSULE BY MOUTH DAILY WITH BREAKFAST. 90 capsule 1   venlafaxine XR (EFFEXOR-XR) 75 MG 24 hr capsule Take 1 capsule (75 mg total) by mouth daily with breakfast. 90 capsule 1   zolpidem (AMBIEN) 5 MG tablet TAKE 1 TABLET BY MOUTH EVERY DAY AT BEDTIME AS NEEDED FOR SLEEP 30 tablet 3   No current facility-administered medications for this visit.    PHYSICAL EXAMINATION: ECOG PERFORMANCE STATUS: 0 - Asymptomatic  Vitals:   11/27/22 1115  BP: (!) 142/92  Pulse: 79  Resp: 15  Temp: 98.6 F (37 C)  SpO2: 100%   Wt Readings from Last 3 Encounters:  11/27/22 173 lb 14.4 oz (78.9 kg)  07/13/22 176 lb 9.4 oz (80.1 kg)  05/14/22 176 lb 9.6 oz (80.1 kg)    GENERAL:alert, no distress and comfortable SKIN: skin color, texture, turgor are normal, no rashes or significant lesions EYES: normal, Conjunctiva are pink and non-injected, sclera clear NECK: supple, thyroid normal size, non-tender, without nodularity LYMPH:  no palpable lymphadenopathy in the cervical, axillary LUNGS: clear to auscultation and percussion with normal breathing effort HEART: regular rate & rhythm and no murmurs and no lower extremity edema ABDOMEN:abdomen soft, non-tender  and normal bowel sounds Musculoskeletal:no cyanosis of digits and no clubbing  NEURO: alert & oriented x 3 with fluent speech, no focal motor/sensory deficits BREAST: s/p b/l mastectomy and implants. No palpable mass, nodules or adenopathy bilaterally. Breast exam benign.       LABORATORY DATA:  I have reviewed the data as listed    Latest Ref Rng & Units 11/27/2022   10:41 AM 07/13/2022    2:46 PM 05/14/2022   10:16 AM  CBC  WBC 4.0 - 10.5 K/uL 4.5  12.3  4.9   Hemoglobin 12.0 - 15.0 g/dL 12.0  12.4  12.2   Hematocrit 36.0 - 46.0 % 35.4  37.8  36.8   Platelets 150 - 400 K/uL 266  344  271  Latest Ref Rng & Units 11/27/2022   10:41 AM 07/13/2022    2:46 PM 05/14/2022   10:16 AM  CMP  Glucose 70 - 99 mg/dL 109  102  112   BUN 6 - 20 mg/dL _0 Creatinine 0.44 - 1.00 mg/dL 0.58  0.68  0.75   Sodium 135 - 145 mmol/L 142  138  141   Potassium 3.5 - 5.1 mmol/L 3.3  3.9  3.7   Chloride 98 - 111 mmol/L 107  102  106   CO2 22 - 32 mmol/L _1 Calcium 8.9 - 10.3 mg/dL 10.0  9.8  9.9   Total Protein 6.5 - 8.1 g/dL 7.0   7.1   Total Bilirubin 0.3 - 1.2 mg/dL 0.3   0.5   Alkaline Phos 38 - 126 U/L 68   81   AST 15 - 41 U/L 14   16   ALT 0 - 44 U/L 11   12       RADIOGRAPHIC STUDIES: I have personally reviewed the radiological images as listed and agreed with the findings in the report. No results found.    Orders Placed This Encounter  Procedures   DG Bone Density    Standing Status:   Future    Standing Expiration Date:   11/27/2023    Order Specific Question:   Reason for Exam (SYMPTOM  OR DIAGNOSIS REQUIRED)    Answer:   screening    Order Specific Question:   Is the patient pregnant?    Answer:   No    Order Specific Question:   Preferred imaging location?    Answer:   [facility]   All questions were answered. The patient knows to call the clinic with any problems, questions or concerns. No barriers to learning was detected. The total  time spent in the appointment was 25 minutes.     Truitt Merle, MD 11/27/2022   Felicity Coyer, CMA, am acting as scribe for Truitt Merle, MD.   I have reviewed the above documentation for accuracy and completeness, and I agree with the above.

## 2022-11-26 NOTE — Assessment & Plan Note (Signed)
stage IA, p(T1a,N1a,M0), ER 10%+, PR-, HER2- Grade I   -Diagnosed in 12/2018. S/p b/l mastectomy on 02/24/19. Pathology showed 1/5 + LNs, margins negative. Baseline CA 27.29 03/2019 was WNL. -She completed adjuvant chemo with TC every 3 weeks for 4 cycles.  -She underwent breast reconstruction with implant placements on 06/30/19.  S/p adjuvant radiation with Dr. Sondra Come 08/17/2019 - 09/28/2019 -she began antiestrogen therapy in 06/2019 but was unable to tolerate anastrozole, exemestane, or letrozole, ultimately stopping all therapy in 03/2020. She is on surveillance given her ER was only 10% positive.

## 2022-11-27 ENCOUNTER — Encounter: Payer: Self-pay | Admitting: Hematology

## 2022-11-27 ENCOUNTER — Other Ambulatory Visit: Payer: Self-pay

## 2022-11-27 ENCOUNTER — Inpatient Hospital Stay: Payer: BC Managed Care – PPO | Attending: Hematology

## 2022-11-27 ENCOUNTER — Inpatient Hospital Stay: Payer: BC Managed Care – PPO | Admitting: Hematology

## 2022-11-27 VITALS — BP 142/92 | HR 79 | Temp 98.6°F | Resp 15 | Ht 62.0 in | Wt 173.9 lb

## 2022-11-27 DIAGNOSIS — N200 Calculus of kidney: Secondary | ICD-10-CM | POA: Insufficient documentation

## 2022-11-27 DIAGNOSIS — C50411 Malignant neoplasm of upper-outer quadrant of right female breast: Secondary | ICD-10-CM

## 2022-11-27 DIAGNOSIS — K219 Gastro-esophageal reflux disease without esophagitis: Secondary | ICD-10-CM | POA: Diagnosis not present

## 2022-11-27 DIAGNOSIS — Z170 Estrogen receptor positive status [ER+]: Secondary | ICD-10-CM | POA: Diagnosis not present

## 2022-11-27 DIAGNOSIS — Z9221 Personal history of antineoplastic chemotherapy: Secondary | ICD-10-CM | POA: Diagnosis not present

## 2022-11-27 DIAGNOSIS — Z853 Personal history of malignant neoplasm of breast: Secondary | ICD-10-CM | POA: Insufficient documentation

## 2022-11-27 DIAGNOSIS — E2839 Other primary ovarian failure: Secondary | ICD-10-CM

## 2022-11-27 DIAGNOSIS — Z79899 Other long term (current) drug therapy: Secondary | ICD-10-CM | POA: Diagnosis not present

## 2022-11-27 DIAGNOSIS — Z923 Personal history of irradiation: Secondary | ICD-10-CM | POA: Diagnosis not present

## 2022-11-27 LAB — CMP (CANCER CENTER ONLY)
ALT: 11 U/L (ref 0–44)
AST: 14 U/L — ABNORMAL LOW (ref 15–41)
Albumin: 4 g/dL (ref 3.5–5.0)
Alkaline Phosphatase: 68 U/L (ref 38–126)
Anion gap: 5 (ref 5–15)
BUN: 8 mg/dL (ref 6–20)
CO2: 30 mmol/L (ref 22–32)
Calcium: 10 mg/dL (ref 8.9–10.3)
Chloride: 107 mmol/L (ref 98–111)
Creatinine: 0.58 mg/dL (ref 0.44–1.00)
GFR, Estimated: >60 mL/min (ref >60)
Glucose, Bld: 109 mg/dL — ABNORMAL HIGH (ref 70–99)
Potassium: 3.3 mmol/L — ABNORMAL LOW (ref 3.5–5.1)
Sodium: 142 mmol/L (ref 135–145)
Total Bilirubin: 0.3 mg/dL (ref 0.3–1.2)
Total Protein: 7 g/dL (ref 6.5–8.1)

## 2022-11-27 LAB — CBC WITH DIFFERENTIAL (CANCER CENTER ONLY)
Abs Immature Granulocytes: 0 10*3/uL (ref 0.00–0.07)
Basophils Absolute: 0.1 10*3/uL (ref 0.0–0.1)
Basophils Relative: 1 %
Eosinophils Absolute: 0.1 10*3/uL (ref 0.0–0.5)
Eosinophils Relative: 2 %
HCT: 35.4 % — ABNORMAL LOW (ref 36.0–46.0)
Hemoglobin: 12 g/dL (ref 12.0–15.0)
Immature Granulocytes: 0 %
Lymphocytes Relative: 25 %
Lymphs Abs: 1.1 10*3/uL (ref 0.7–4.0)
MCH: 29.5 pg (ref 26.0–34.0)
MCHC: 33.9 g/dL (ref 30.0–36.0)
MCV: 87 fL (ref 80.0–100.0)
Monocytes Absolute: 0.5 10*3/uL (ref 0.1–1.0)
Monocytes Relative: 10 %
Neutro Abs: 2.8 10*3/uL (ref 1.7–7.7)
Neutrophils Relative %: 62 %
Platelet Count: 266 10*3/uL (ref 150–400)
RBC: 4.07 MIL/uL (ref 3.87–5.11)
RDW: 12.6 % (ref 11.5–15.5)
WBC Count: 4.5 10*3/uL (ref 4.0–10.5)
nRBC: 0 % (ref 0.0–0.2)

## 2022-11-27 MED ORDER — POTASSIUM CHLORIDE CRYS ER 10 MEQ PO TBCR: 10.0000 meq | EXTENDED_RELEASE_TABLET | Freq: Every day | ORAL | 0 refills | Status: DC

## 2022-11-27 MED ORDER — ZOLPIDEM TARTRATE 5 MG PO TABS: ORAL_TABLET | ORAL | 3 refills | Status: DC

## 2022-11-28 LAB — CANCER ANTIGEN 27.29: CA 27.29: 23.5 U/mL (ref 0.0–38.6)

## 2023-03-18 ENCOUNTER — Emergency Department (HOSPITAL_BASED_OUTPATIENT_CLINIC_OR_DEPARTMENT_OTHER): Payer: BC Managed Care – PPO

## 2023-03-18 ENCOUNTER — Other Ambulatory Visit: Payer: Self-pay

## 2023-03-18 ENCOUNTER — Encounter (HOSPITAL_BASED_OUTPATIENT_CLINIC_OR_DEPARTMENT_OTHER): Payer: Self-pay

## 2023-03-18 ENCOUNTER — Telehealth: Payer: Self-pay

## 2023-03-18 ENCOUNTER — Emergency Department (HOSPITAL_BASED_OUTPATIENT_CLINIC_OR_DEPARTMENT_OTHER)
Admission: EM | Admit: 2023-03-18 | Discharge: 2023-03-18 | Discharge status: Against Medical Advice | Payer: BC Managed Care – PPO | Attending: Emergency Medicine | Admitting: Emergency Medicine

## 2023-03-18 DIAGNOSIS — R202 Paresthesia of skin: Secondary | ICD-10-CM | POA: Diagnosis not present

## 2023-03-18 DIAGNOSIS — R42 Dizziness and giddiness: Secondary | ICD-10-CM | POA: Diagnosis not present

## 2023-03-18 DIAGNOSIS — Z5321 Procedure and treatment not carried out due to patient leaving prior to being seen by health care provider: Secondary | ICD-10-CM | POA: Diagnosis not present

## 2023-03-18 DIAGNOSIS — R519 Headache, unspecified: Secondary | ICD-10-CM | POA: Insufficient documentation

## 2023-03-18 DIAGNOSIS — H538 Other visual disturbances: Secondary | ICD-10-CM | POA: Insufficient documentation

## 2023-03-18 LAB — CBC
HCT: 35.6 % — ABNORMAL LOW (ref 36.0–46.0)
Hemoglobin: 11.8 g/dL — ABNORMAL LOW (ref 12.0–15.0)
MCH: 28.6 pg (ref 26.0–34.0)
MCHC: 33.1 g/dL (ref 30.0–36.0)
MCV: 86.4 fL (ref 80.0–100.0)
Platelets: 309 10*3/uL (ref 150–400)
RBC: 4.12 MIL/uL (ref 3.87–5.11)
RDW: 13.1 % (ref 11.5–15.5)
WBC: 5.2 10*3/uL (ref 4.0–10.5)
nRBC: 0 % (ref 0.0–0.2)

## 2023-03-18 LAB — BASIC METABOLIC PANEL
Anion gap: 6 (ref 5–15)
BUN: 8 mg/dL (ref 6–20)
CO2: 25 mmol/L (ref 22–32)
Calcium: 9.3 mg/dL (ref 8.9–10.3)
Chloride: 105 mmol/L (ref 98–111)
Creatinine, Ser: 0.59 mg/dL (ref 0.44–1.00)
GFR, Estimated: >60 mL/min (ref >60)
Glucose, Bld: 102 mg/dL — ABNORMAL HIGH (ref 70–99)
Potassium: 3.4 mmol/L — ABNORMAL LOW (ref 3.5–5.1)
Sodium: 136 mmol/L (ref 135–145)

## 2023-03-18 LAB — GLUCOSE, CAPILLARY: Glucose-Capillary: 83 mg/dL (ref 70–99)

## 2023-03-18 NOTE — ED Triage Notes (Signed)
Pt c/o dizziness x2 days, tingling in face & R arm, both hands & both feet. Associated HA, blurred vision onset yesterday, states neck has started hurting x1hr.  [facility] 145[facility], reports "that's very high for me." No unilateral weakness, sensory deficit noted in triage.

## 2023-03-18 NOTE — Telephone Encounter (Signed)
Pt called stating she's experiencing dizziness and was told by Dr. Burr Medico to contact her office if she starts experiencing these symptoms.  Pt denied contacting her PCP regarding her symptoms.  Pt is taking Effexor and stated that she has some lung nodules that Dr. Burr Medico is monitoring as well.  Pt denied taking a BP or monitoring her BP since she's experiencing dizziness.  Pt stated she would like to see Dr. Burr Medico this week if possible.  Informed pt that Dr. Burr Medico is currently out of the office but will notify her of the patient's symptoms.  Instructed pt to please start monitoring her BP twice daily and especially when she's experiencing times of dizziness.  Pt verbalized understanding of directions.

## 2023-03-20 ENCOUNTER — Telehealth: Payer: Self-pay | Admitting: Hematology

## 2023-03-20 NOTE — Telephone Encounter (Signed)
Contacted patient to scheduled appointments. Patient is aware of appointments that are scheduled.   

## 2023-03-21 ENCOUNTER — Encounter: Payer: Self-pay | Admitting: Hematology

## 2023-03-21 ENCOUNTER — Inpatient Hospital Stay: Payer: BC Managed Care – PPO | Attending: Hematology | Admitting: Hematology

## 2023-03-21 VITALS — BP 134/82 | HR 97 | Temp 97.7°F | Resp 17 | Ht 63.0 in | Wt 175.1 lb

## 2023-03-21 DIAGNOSIS — R918 Other nonspecific abnormal finding of lung field: Secondary | ICD-10-CM | POA: Diagnosis not present

## 2023-03-21 DIAGNOSIS — Z79899 Other long term (current) drug therapy: Secondary | ICD-10-CM | POA: Diagnosis not present

## 2023-03-21 DIAGNOSIS — N200 Calculus of kidney: Secondary | ICD-10-CM | POA: Diagnosis not present

## 2023-03-21 DIAGNOSIS — G629 Polyneuropathy, unspecified: Secondary | ICD-10-CM | POA: Diagnosis not present

## 2023-03-21 DIAGNOSIS — C50411 Malignant neoplasm of upper-outer quadrant of right female breast: Secondary | ICD-10-CM | POA: Diagnosis not present

## 2023-03-21 DIAGNOSIS — K219 Gastro-esophageal reflux disease without esophagitis: Secondary | ICD-10-CM | POA: Diagnosis not present

## 2023-03-21 DIAGNOSIS — Z800 Family history of malignant neoplasm of digestive organs: Secondary | ICD-10-CM | POA: Diagnosis not present

## 2023-03-21 DIAGNOSIS — I700 Atherosclerosis of aorta: Secondary | ICD-10-CM | POA: Insufficient documentation

## 2023-03-21 DIAGNOSIS — Z170 Estrogen receptor positive status [ER+]: Secondary | ICD-10-CM | POA: Diagnosis not present

## 2023-03-21 DIAGNOSIS — R42 Dizziness and giddiness: Secondary | ICD-10-CM | POA: Insufficient documentation

## 2023-03-21 DIAGNOSIS — Z79811 Long term (current) use of aromatase inhibitors: Secondary | ICD-10-CM | POA: Insufficient documentation

## 2023-03-21 MED ORDER — LORAZEPAM 1 MG PO TABS: 1.0000 mg | ORAL_TABLET | Freq: Once | ORAL | 0 refills | Status: AC

## 2023-03-21 NOTE — Progress Notes (Signed)
[name]   Clinic Follow up Note   Patient Care Team: Bernita BuffyWilliams, Breejante J, PA-C as PCP - General (Physician Assistant) Griselda Mineroth, Paul III, MD as Consulting Physician (General Surgery) Malachy MoodFeng, Kellan Boehlke, MD as Consulting Physician (Hematology) Antony BlackbirdKinard, James, MD as Consulting Physician (Radiation Oncology) Donnelly AngelicaMartini, Keisha N, RN as Oncology Nurse Navigator Pershing ProudStuart, Dawn C, RN as Oncology Nurse Navigator Pollyann SamplesBurton, Lacie K, NP as Nurse Practitioner (Nurse Practitioner)  Date of Service:  03/21/2023  CHIEF COMPLAINT: f/u of right breast cancer   CURRENT THERAPY:  Surveillance   ASSESSMENT:  [name] is a 57 y.o. female with   Dizziness, headache, and blurry vision -Started 4 days ago, was intermittent initially, has been persistent in the past few days -Neuroexam was unremarkable today -He went to the emergency room 3 days ago, CT head without contrast was negative -Due to her history of breast cancer, and multiple neurological symptoms, I will obtain a stat brain MRI w wo contrast asap -If brain MRI negative, will refer her to see neurology.  Malignant neoplasm of upper-outer quadrant of right breast in female, estrogen receptor positive (HCC) stage IA, Snyder(T1a,N1a,M0), ER 10%+, PR-, HER2- Grade I   -Diagnosed in 12/2018. S/Snyder b/l mastectomy on 02/24/19. Pathology showed 1/5 + LNs, margins negative. Baseline CA 27.29 03/2019 was WNL. -She completed adjuvant chemo with TC every 3 weeks for 4 cycles.  -She underwent breast reconstruction with implant placements on 06/30/19.  S/Snyder adjuvant radiation with Dr. Roselind MessierKinard 08/17/2019 - 09/28/2019 -she began antiestrogen therapy in 06/2019 but was unable to tolerate anastrozole, exemestane, or letrozole, ultimately stopping all therapy in 03/2020. She is on surveillance given her ER was only 10% positive.     PLAN: -recommend Brain MRI W WO contrast, scheduled for tomorrow, I called in Ativan as  needed for her claustrophobia -I will call pt with results, if brain MRI negative, will refer to neurology -She is scheduled to follow-up with us in 2 months    SUMMARY OF ONCOLOGIC HISTORY: Oncology History Overview Note  Cancer Staging Malignant neoplasm of upper-outer quadrant of right breast in female, estrogen receptor positive (HCC) Staging form: Breast, AJCC 8th Edition - Clinical stage from 01/15/2019: Stage IA (cT1b, cN0, cM0, G1, ER+, PR-, HER2-) - Signed by Malachy MoodFeng, Tiffany Hardenbrook, MD on 01/26/2019 - Pathologic stage from 02/24/2019: Stage IB (pT1a, pN1a, cM0, G1, ER+, PR-, HER2-) - Signed by Malachy MoodFeng, Tiffany Kerlin, MD on 03/18/2019     Malignant neoplasm of upper-outer quadrant of right breast in female, estrogen receptor positive  01/15/2019 Cancer Staging   Staging form: Breast, AJCC 8th Edition - Clinical stage from 01/15/2019: Stage IA (cT1b, cN0, cM0, G1, ER+, PR-, HER2-) - Signed by Malachy MoodFeng, Tiffany Taves, MD on 01/26/2019   01/15/2019 Mammogram   Diagnostic Mammogram 01/15/19  IMPRESSION Targeted ultrasound is performed, showing right breast 10 o'clock 5 cm from the nipple hypoechoic round mass measuring 0.7 x 0.5 x 0.6 cm. Adjacent to it there is a probable complicated cyst measuring 4 mm. There is no evidence of right axillary lymphadenopathy.   01/15/2019 Initial Biopsy   Diagnosis 01/15/19  Breast, right, needle core biopsy, upper outer - INVASIVE AND IN SITU MAMMARY CARCINOMA WITH CALCIFICATIONS. - SEE COMMENT. 1 of 3 FINAL for Snyder, [name] ([id number]) Microscopic Comment The carcinoma appears grade I. The malignant cells are positive for cytokeratin AE1/AE3 and negative for E-cadherin. A breast prognostic profile will be performed and the results reported separately. Dr Amil AmenJulia  Berneice Snyder has reviewed the case and concurs with this interpretation. The results are called to [facility] on 01/18/2019. (JBK:ah:ecj 01/18/19)   01/15/2019 Receptors her2   PROGNOSTIC  INDICATORS Results: IMMUNOHISTOCHEMICAL AND MORPHOMETRIC ANALYSIS PERFORMED MANUALLY The tumor cells are NEGATIVE for Her2 (0). Estrogen Receptor: 10%, POSITIVE, MODERATE STAINING INTENSITY Progesterone Receptor: 0%, NEGATIVE Proliferation Marker Ki67: <1%   01/21/2019 Initial Diagnosis   Malignant neoplasm of upper-outer quadrant of right breast in female, estrogen receptor positive (HCC)   01/29/2019 Breast MRI   IMPRESSION: 1. The biopsy-proven site of malignancy in the upper-outer quadrant of the right breast resides centrally with in an area of suspicious non mass enhancement spanning 6-7 cm.   2. There is a 6 mm suspicious enhancing mass in the lower-inner quadrant of the right breast, posterior depth.   3. There is a 9 mm indeterminate enhancing mass in the lower outer quadrant of the right breast.   4.  No evidence of malignancy in the left breast.   02/24/2019 Surgery   RIGHT MASTECTOMY WITH SENTINEL LYMPH NODE MAPPING AND LEFT PROPHYLACTIC MASTECTOMY by Dr. Carolynne Snyder  BREAST RECONSTRUCTION WITH PLACEMENT OF TISSUE EXPANDER by Dr. Ulice Snyder  02/24/19    02/24/2019 Pathology Results   Diagnosis 1. Breast, simple mastectomy, Left - BENIGN BREAST PARENCHYMA WITH FIBROCYSTIC CHANGE. - NEGATIVE FOR CARCINOMA. 2. Breast, simple mastectomy, Right - LOBULAR CARCINOMA IN SITU, INTERMEDIATE TO HIGH NUCLEAR GRADE. SEE NOTE. - NO EVIDENCE OF RESIDUAL INVASIVE CARCINOMA. - RESECTION MARGINS ARE NEGATIVE FOR IN SITU OR INVASIVE CARCINOMA. - BIOPSY SITE CHANGES. - SEE ONCOLOGY TABLE. 3. Lymph node, sentinel, biopsy, Right #1 - IMMUNOHISTOCHEMICAL STAIN FOR AE1/AE3 IS NEGATIVE FOR CARCINOMA (0/1). 4. Lymph node, sentinel, biopsy, Right - IMMUNOHISTOCHEMICAL STAIN FOR AE1/AE3 IS NEGATIVE FOR CARCINOMA (0/1). 5. Lymph node, sentinel, biopsy, Right #2 - IMMUNOHISTOCHEMICAL STAIN FOR AE1/AE3 IS NEGATIVE FOR CARCINOMA (0/1). 6. Lymph node, sentinel, biopsy, Right - IMMUNOHISTOCHEMICAL STAIN  FOR AE1/AE3 IS NEGATIVE FOR CARCINOMA (0/1). 7. Lymph node, sentinel, biopsy, Right - METASTATIC CARCINOMA TO ONE LYMPH NODE (1/1) (CONFIRMED WITH IMMUNOHISTOCHEMICAL STAIN FOR AE1/AE3) - FOCUS OF METASTATIC CARCINOMA MEASURES JUST OVER 0.2 CM IN GREATEST DIMENSION - NO EVIDENCE OF EXTRANODAL EXTENSION   02/24/2019 Cancer Staging   Staging form: Breast, AJCC 8th Edition - Pathologic stage from 02/24/2019: Stage IB (pT1a, pN1a, cM0, G1, ER+, PR-, HER2-) - Signed by Malachy Mood, MD on 03/18/2019   04/01/2019 - 06/03/2019 Chemotherapy   adjuvant Taxol and Carboplatin every 3 weeks for 4 cycles starting in 2 weeks starting 04/01/19-06/03/19   06/2019 - 03/2020 Anti-estrogen oral therapy   Anastrozole 1mg  daily starting 06/2019, held after 2 weeks due to hot flashes and joint pain and RT. Switched to Exemestane in 10/2019. Switched to Exemestane in 10/2019, ultimately changed to letrozole in 01/2020 due to worsening joint pain. Ultimately stopped anti-estrogen therapies in 03/2020 due to arthralgias    06/30/2019 Surgery   REMOVAL OF BILATERAL TISSUE EXPANDERS WITH PLACEMENT OF BILATERAL BREAST IMPLANTS by Dr [name]  06/30/19    08/17/2019 - 09/28/2019 Radiation Therapy   Adjuvant Radiaiton with Dr Roselind Messier  Site Technique Total Dose (Gy) Dose per Fx (Gy) Completed Fx Beam Energies  Thorax: CW_Rt_Bst_boost Electron 10/10 2 5/5 6E  Thorax: CW_Rt 3D 50/50 2 25/25 10X, 15X, 6X  Thorax: CW_Rt_axilla Complex 50/50 2 25/25 10X    12/23/2019 Survivorship   SCP delivered by Santiago Glad, NP    06/02/2020 Imaging   MRI brain with and  without contrast IMPRESSION: 1. No acute intracranial abnormality or evidence of metastatic disease. 2. Mild cerebral white matter T2 signal changes, nonspecific though may reflect chronic small vessel ischemic disease, migraines, or prior infection/inflammation.   06/06/2020 Imaging   Bone scan IMPRESSION: No definite scintigraphic evidence of osseous metastases.  Mildly abnormal uptake is noted in both knees and feet most consistent with degenerative change.   03/05/2021 Imaging   CT Chest  IMPRESSION: 1. Resolution of ground-glass in the RIGHT upper lobe that was seen on the previous imaging. Compatible with resolution of inflammation. 2. Tiny basilar nodules at the LEFT lung base unchanged since May of 2021 and seen in hind site on the study of May of 2020, largest 3 mm in the LEFT lower lobe. These are likely benign. 3. Slowly enlarging LEFT internal mammary lymph nodes (less than a cm but increasing in size) the with scattered small lymph nodes throughout the chest. Presence of internal mammary lymph nodes is suspicious given patient history. Could consider short interval follow-up in 3 months or PET imaging for further assessment. Smaller internal mammary lymph nodes are present on the RIGHT. 4. 3 mm calculus in the upper pole the RIGHT kidney. 5. Aortic atherosclerosis.   Aortic Atherosclerosis (ICD10-I70.0).   03/29/2021 PET scan   IMPRESSION: 1. There are multiple hypermetabolic mediastinal, internal mammary and left axillary lymph nodes as described. The intensity of the hypermetabolic activity and distribution of the lymph nodes are suspicious for metastatic disease in this patient with a history of breast cancer. 2. Solitary hypermetabolic right lower lobe pulmonary nodule, also suspicious for metastasis. 3. Indeterminate small foci of hypermetabolic activity in the left thoracic paraspinal soft tissues. 4. No evidence of metastatic disease in the abdomen, pelvis or bones. 5. Bilateral nephrolithiasis.   04/10/2021 Imaging   [place] Left Axillary  IMPRESSION: Suspicious left axillary lymph node.   RECOMMENDATION: Ultrasound-guided core needle biopsy suspicious left axillary lymph node.   04/10/2021 Pathology Results   Diagnosis Lymph node, needle/core biopsy, left axillary node - GRANULOMATOUS INFLAMMATION. Microscopic  Comment Granulomatous inflammation is epithelioid in nature. The differential diagnosis includes sarcoidosis. Clinical correlation is recommended. Special stains AFB (acid-fast bacillus) and GMS (fungus) will be reported separately. Dr. Charm Barges reviewed the case      INTERVAL HISTORY:  LACHON MARINKO is here for a follow up of right breast cancer.  She was last seen by me on 01/28/2022. She presents to the clinic accompanied by husband. Pt state that she been dizzy. Pt stated when she lay on her left side she would get dizzy. Monday she woke up dizzy, Tuesday she stated that she had tingling in her face and dizziness. Pt report she went to the ED. Pt reports of blurry vision and headache. Pt states of having nimbleness and tingling in hands and feet.Pt had some tenderness to the right side of the back.     All other systems were reviewed with the patient and are negative.  MEDICAL HISTORY:  Past Medical History:  Diagnosis Date   Anemia    Anxiety    Arthritis    Cancer    right breast   Family history of adverse reaction to anesthesia    sister has same issues   GERD (gastroesophageal reflux disease)    Neuromuscular disorder    neuropathy   PONV (postoperative nausea and vomiting)     SURGICAL HISTORY: Past Surgical History:  Procedure Laterality Date   BREAST RECONSTRUCTION WITH PLACEMENT OF TISSUE EXPANDER AND  FLEX HD (ACELLULAR HYDRATED DERMIS) Bilateral 02/24/2019   Procedure: BREAST RECONSTRUCTION WITH PLACEMENT OF TISSUE EXPANDER AND FLEX HD (ACELLULAR HYDRATED DERMIS);  Surgeon: Peggye Form, DO;  Location: [facility] OR;  Service: Plastics;  Laterality: Bilateral;   BREAST SURGERY     left breast bx   COLONOSCOPY     ESOPHAGOGASTRODUODENOSCOPY (EGD) WITH ESOPHAGEAL DILATION     EYE SURGERY     dart to the eye  (right)   FACIAL FRACTURE SURGERY     FRACTURE SURGERY     KNEE SURGERY     MASTECTOMY W/ SENTINEL NODE BIOPSY Bilateral 02/24/2019   Procedure: RIGHT  MASTECTOMY WITH SENTINEL LYMPH NODE MAPPING AND LEFT PROPHYLACTIC MASTECTOMY;  Surgeon: Griselda Miner, MD;  Location: [facility] OR;  Service: General;  Laterality: Bilateral;   POLYPECTOMY     REMOVAL OF BILATERAL TISSUE EXPANDERS WITH PLACEMENT OF BILATERAL BREAST IMPLANTS Bilateral 06/30/2019   Procedure: REMOVAL OF BILATERAL TISSUE EXPANDERS WITH PLACEMENT OF BILATERAL BREAST IMPLANTS;  Surgeon: Peggye Form, DO;  Location: [facility];  Service: Plastics;  Laterality: Bilateral;   SHOULDER SURGERY      I have reviewed the social history and family history with the patient and they are unchanged from previous note.  ALLERGIES:  is allergic to codeine and tramadol.  MEDICATIONS:  Current Outpatient Medications  Medication Sig Dispense Refill   LORazepam (ATIVAN) 1 MG tablet Take 1 tablet (1 mg total) by mouth once for 1 dose. 2 tablet 0   Cholecalciferol (VITAMIN D3) 125 MCG (5000 UT) CAPS Take 5,000 Units by mouth daily. (Patient not taking: Reported on 03/18/2022)     ibuprofen (ADVIL,MOTRIN) 200 MG tablet Take 600 mg by mouth every 6 (six) hours as needed for moderate pain.      Multiple Vitamin (MULTIVITAMIN WITH MINERALS) TABS tablet Take 1 tablet by mouth daily. (Patient not taking: Reported on 03/18/2022)     omeprazole (PRILOSEC) 20 MG capsule Take 20 mg by mouth daily.     potassium chloride (KLOR-CON M) 10 MEQ tablet Take 1 tablet (10 mEq total) by mouth daily. 5 tablet 0   venlafaxine XR (EFFEXOR-XR) 75 MG 24 hr capsule TAKE 1 CAPSULE BY MOUTH DAILY WITH BREAKFAST. 90 capsule 1   venlafaxine XR (EFFEXOR-XR) 75 MG 24 hr capsule Take 1 capsule (75 mg total) by mouth daily with breakfast. 90 capsule 1   zolpidem (AMBIEN) 5 MG tablet TAKE 1 TABLET BY MOUTH EVERY DAY AT BEDTIME AS NEEDED FOR SLEEP 30 tablet 3   No current facility-administered medications for this visit.    PHYSICAL EXAMINATION: ECOG PERFORMANCE STATUS: 2 - Symptomatic, <50% confined to bed  Vitals:    03/21/23 1333  BP: 134/82  Pulse: 97  Resp: 17  Temp: 97.7 F (36.5 C)  SpO2: 99%   Wt Readings from Last 3 Encounters:  03/21/23 175 lb 1.6 oz (79.4 kg)  11/27/22 173 lb 14.4 oz (78.9 kg)  07/13/22 176 lb 9.4 oz (80.1 kg)     EYES: (-) normal, Conjunctiva are pink and non-injected, sclera clear NEURO: (-) alert & oriented x 3 with fluent speech, no focal motor/sensory deficits  LABORATORY DATA:  I have reviewed the data as listed    Latest Ref Rng & Units 03/18/2023    6:20 PM 11/27/2022   10:41 AM 07/13/2022    2:46 PM  CBC  WBC 4.0 - 10.5 K/uL 5.2  4.5  12.3   Hemoglobin 12.0 - 15.0 g/dL 11.8  12.0  12.4   Hematocrit 36.0 - 46.0 % 35.6  35.4  37.8   Platelets 150 - 400 K/uL 309  266  344         Latest Ref Rng & Units 03/18/2023    6:20 PM 11/27/2022   10:41 AM 07/13/2022    2:46 PM  CMP  Glucose 70 - 99 mg/dL 161  096  045   BUN 6 - 20 mg/dL 8  8  12    Creatinine 0.44 - 1.00 mg/dL 4.09  8.11  9.14   Sodium 135 - 145 mmol/L 136  142  138   Potassium 3.5 - 5.1 mmol/L 3.4  3.3  3.9   Chloride 98 - 111 mmol/L 105  107  102   CO2 22 - 32 mmol/L 25  30  26    Calcium 8.9 - 10.3 mg/dL 9.3  78.2  9.8   Total Protein 6.5 - 8.1 g/dL  7.0    Total Bilirubin 0.3 - 1.2 mg/dL  0.3    Alkaline Phos 38 - 126 U/L  68    AST 15 - 41 U/L  14    ALT 0 - 44 U/L  11        RADIOGRAPHIC STUDIES: I have personally reviewed the radiological images as listed and agreed with the findings in the report. No results found.    Orders Placed This Encounter  Procedures   MR Brain W Wo Contrast    Standing Status:   Future    Standing Expiration Date:   03/20/2024    Order Specific Question:   If indicated for the ordered procedure, I authorize the administration of contrast media per Radiology protocol    Answer:   Yes    Order Specific Question:   What is the patient's sedation requirement?    Answer:   No Sedation    Order Specific Question:   Does the patient have a pacemaker or  implanted devices?    Answer:   No    Order Specific Question:   Use SRS Protocol?    Answer:   No    Order Specific Question:   Preferred imaging location?    Answer:   [facility] (table limit - 550 lbs)    Order Specific Question:   Release to patient    Answer:   Immediate   All questions were answered. The patient knows to call the clinic with any problems, questions or concerns. No barriers to learning was detected. The total time spent in the appointment was 30 minutes.     Malachy Mood, MD 03/21/2023   Carolin Coy, CMA, am acting as scribe for Malachy Mood, MD.   I have reviewed the above documentation for accuracy and completeness, and I agree with the above.

## 2023-03-21 NOTE — Assessment & Plan Note (Signed)
stage IA, p(T1a,N1a,M0), ER 10%+, PR-, HER2- Grade I   -Diagnosed in 12/2018. S/p b/l mastectomy on 02/24/19. Pathology showed 1/5 + LNs, margins negative. Baseline CA 27.29 03/2019 was WNL. -She completed adjuvant chemo with TC every 3 weeks for 4 cycles.  -She underwent breast reconstruction with implant placements on 06/30/19.  S/p adjuvant radiation with Dr. Kinard 08/17/2019 - 09/28/2019 -she began antiestrogen therapy in 06/2019 but was unable to tolerate anastrozole, exemestane, or letrozole, ultimately stopping all therapy in 03/2020. She is on surveillance given her ER was only 10% positive. 

## 2023-03-22 ENCOUNTER — Ambulatory Visit (HOSPITAL_COMMUNITY)
Admission: RE | Admit: 2023-03-22 | Discharge: 2023-03-22 | Disposition: A | Discharge status: Home / Self Care | Payer: BC Managed Care – PPO | Source: Ambulatory Visit | Attending: Hematology | Admitting: Hematology

## 2023-03-22 DIAGNOSIS — Z170 Estrogen receptor positive status [ER+]: Secondary | ICD-10-CM | POA: Insufficient documentation

## 2023-03-22 DIAGNOSIS — C50411 Malignant neoplasm of upper-outer quadrant of right female breast: Secondary | ICD-10-CM | POA: Insufficient documentation

## 2023-03-22 MED ORDER — GADOBUTROL 1 MMOL/ML IV SOLN
8.0000 mL | Freq: Once | INTRAVENOUS | Status: AC | PRN
  Administered 2023-03-22: 8 mL via INTRAVENOUS

## 2023-03-24 ENCOUNTER — Telehealth: Payer: Self-pay

## 2023-03-24 ENCOUNTER — Encounter: Payer: Self-pay | Admitting: Hematology

## 2023-03-24 NOTE — Telephone Encounter (Signed)
Pt LVM stating that she completed her MRI on Saturday and stated that Dr. Mosetta Putt had told her she would contact her today with the MRI results.  Notified Dr. Mosetta Putt of the pt's voicemail message.

## 2023-03-25 ENCOUNTER — Telehealth: Payer: Self-pay

## 2023-03-25 DIAGNOSIS — C50411 Malignant neoplasm of upper-outer quadrant of right female breast: Secondary | ICD-10-CM

## 2023-03-25 NOTE — Telephone Encounter (Signed)
Pt called stating she talked with Dr. Mosetta Putt on 03/24/2023 regarding her recent MRI results.  At the time of discussion pt stated Dr. Mosetta Putt offered to refer pt to Neurology regarding her symptoms.  Pt refused referral and stated she would f/u with PCP.  Pt now has developed twitching in her eye and wants Dr. Mosetta Putt to be aware and would like to be referred to neurology.  Informed pt that this RN will notify Dr. Mosetta Putt of the pt's change in decision.

## 2023-03-27 ENCOUNTER — Inpatient Hospital Stay: Payer: BC Managed Care – PPO | Admitting: Internal Medicine

## 2023-03-27 ENCOUNTER — Other Ambulatory Visit: Payer: Self-pay

## 2023-03-27 VITALS — BP 141/87 | HR 89 | Temp 97.9°F | Resp 16 | Wt 178.2 lb

## 2023-03-27 DIAGNOSIS — G6289 Other specified polyneuropathies: Secondary | ICD-10-CM

## 2023-03-27 DIAGNOSIS — G629 Polyneuropathy, unspecified: Secondary | ICD-10-CM | POA: Insufficient documentation

## 2023-03-27 DIAGNOSIS — C50411 Malignant neoplasm of upper-outer quadrant of right female breast: Secondary | ICD-10-CM | POA: Diagnosis not present

## 2023-03-27 DIAGNOSIS — R42 Dizziness and giddiness: Secondary | ICD-10-CM | POA: Diagnosis not present

## 2023-03-27 NOTE — Progress Notes (Signed)
[facility] at [facility] [address]  [place], [place] [address] [phone]   New Patient Evaluation  Date of Service: 03/27/23 Patient Name: [name] Patient MRN: [id number] Patient DOB: [birth year] Provider: Henreitta Leber, MD  Identifying Statement:  [name] is a 57 y.o. female with Vertigo  Other polyneuropathy who presents for initial consultation and evaluation regarding cancer associated neurologic deficits.    Referring Provider: Roger Kill, PA-C [address],  [place] [address]  Primary Cancer:  Oncologic History: Oncology History Overview Note  Cancer Staging Malignant neoplasm of upper-outer quadrant of right breast in female, estrogen receptor positive (HCC) Staging form: Breast, AJCC 8th Edition - Clinical stage from 01/15/2019: Stage IA (cT1b, cN0, cM0, G1, ER+, PR-, HER2-) - Signed by Malachy Mood, MD on 01/26/2019 - Pathologic stage from 02/24/2019: Stage IB (pT1a, pN1a, cM0, G1, ER+, PR-, HER2-) - Signed by Malachy Mood, MD on 03/18/2019     Malignant neoplasm of upper-outer quadrant of right breast in female, estrogen receptor positive  01/15/2019 Cancer Staging   Staging form: Breast, AJCC 8th Edition - Clinical stage from 01/15/2019: Stage IA (cT1b, cN0, cM0, G1, ER+, PR-, HER2-) - Signed by Malachy Mood, MD on 01/26/2019   01/15/2019 Mammogram   Diagnostic Mammogram 01/15/19  IMPRESSION Targeted ultrasound is performed, showing right breast 10 o'clock 5 cm from the nipple hypoechoic round mass measuring 0.7 x 0.5 x 0.6 cm. Adjacent to it there is a probable complicated cyst measuring 4 mm. There is no evidence of right axillary lymphadenopathy.   01/15/2019 Initial Biopsy   Diagnosis 01/15/19  Breast, right, needle core biopsy, upper outer - INVASIVE AND IN SITU MAMMARY CARCINOMA WITH CALCIFICATIONS. - SEE COMMENT. 1 of 3 FINAL for Anstey, Debby P ([id number]) Microscopic Comment The carcinoma  appears grade I. The malignant cells are positive for cytokeratin AE1/AE3 and negative for E-cadherin. A breast prognostic profile will be performed and the results reported separately. Dr Valinda Hoar has reviewed the case and concurs with this interpretation. The results are called to [facility] on 01/18/2019. (JBK:ah:ecj 01/18/19)   01/15/2019 Receptors her2   PROGNOSTIC INDICATORS Results: IMMUNOHISTOCHEMICAL AND MORPHOMETRIC ANALYSIS PERFORMED MANUALLY The tumor cells are NEGATIVE for Her2 (0). Estrogen Receptor: 10%, POSITIVE, MODERATE STAINING INTENSITY Progesterone Receptor: 0%, NEGATIVE Proliferation Marker Ki67: <1%   01/21/2019 Initial Diagnosis   Malignant neoplasm of upper-outer quadrant of right breast in female, estrogen receptor positive (HCC)   01/29/2019 Breast MRI   IMPRESSION: 1. The biopsy-proven site of malignancy in the upper-outer quadrant of the right breast resides centrally with in an area of suspicious non mass enhancement spanning 6-7 cm.   2. There is a 6 mm suspicious enhancing mass in the lower-inner quadrant of the right breast, posterior depth.   3. There is a 9 mm indeterminate enhancing mass in the lower outer quadrant of the right breast.   4.  No evidence of malignancy in the left breast.   02/24/2019 Surgery   RIGHT MASTECTOMY WITH SENTINEL LYMPH NODE MAPPING AND LEFT PROPHYLACTIC MASTECTOMY by Dr. Carolynne Edouard  BREAST RECONSTRUCTION WITH PLACEMENT OF TISSUE EXPANDER by Dr. Ulice Bold  02/24/19    02/24/2019 Pathology Results   Diagnosis 1. Breast, simple mastectomy, Left - BENIGN BREAST PARENCHYMA WITH FIBROCYSTIC CHANGE. - NEGATIVE FOR CARCINOMA. 2. Breast, simple mastectomy, Right - LOBULAR CARCINOMA IN SITU, INTERMEDIATE TO HIGH NUCLEAR GRADE. SEE NOTE. - NO EVIDENCE OF RESIDUAL INVASIVE  CARCINOMA. - RESECTION MARGINS ARE NEGATIVE FOR IN SITU OR INVASIVE CARCINOMA. - BIOPSY SITE CHANGES. - SEE ONCOLOGY TABLE. 3. Lymph node,  sentinel, biopsy, Right #1 - IMMUNOHISTOCHEMICAL STAIN FOR AE1/AE3 IS NEGATIVE FOR CARCINOMA (0/1). 4. Lymph node, sentinel, biopsy, Right - IMMUNOHISTOCHEMICAL STAIN FOR AE1/AE3 IS NEGATIVE FOR CARCINOMA (0/1). 5. Lymph node, sentinel, biopsy, Right #2 - IMMUNOHISTOCHEMICAL STAIN FOR AE1/AE3 IS NEGATIVE FOR CARCINOMA (0/1). 6. Lymph node, sentinel, biopsy, Right - IMMUNOHISTOCHEMICAL STAIN FOR AE1/AE3 IS NEGATIVE FOR CARCINOMA (0/1). 7. Lymph node, sentinel, biopsy, Right - METASTATIC CARCINOMA TO ONE LYMPH NODE (1/1) (CONFIRMED WITH IMMUNOHISTOCHEMICAL STAIN FOR AE1/AE3) - FOCUS OF METASTATIC CARCINOMA MEASURES JUST OVER 0.2 CM IN GREATEST DIMENSION - NO EVIDENCE OF EXTRANODAL EXTENSION   02/24/2019 Cancer Staging   Staging form: Breast, AJCC 8th Edition - Pathologic stage from 02/24/2019: Stage IB (pT1a, pN1a, cM0, G1, ER+, PR-, HER2-) - Signed by Malachy MoodFeng, Yan, MD on 03/18/2019   04/01/2019 - 06/03/2019 Chemotherapy   adjuvant Taxol and Carboplatin every 3 weeks for 4 cycles starting in 2 weeks starting 04/01/19-06/03/19   06/2019 - 03/2020 Anti-estrogen oral therapy   Anastrozole 1mg  daily starting 06/2019, held after 2 weeks due to hot flashes and joint pain and RT. Switched to Exemestane in 10/2019. Switched to Exemestane in 10/2019, ultimately changed to letrozole in 01/2020 due to worsening joint pain. Ultimately stopped anti-estrogen therapies in 03/2020 due to arthralgias    06/30/2019 Surgery   REMOVAL OF BILATERAL TISSUE EXPANDERS WITH PLACEMENT OF BILATERAL BREAST IMPLANTS by Dr Ulice Boldillingham  06/30/19    08/17/2019 - 09/28/2019 Radiation Therapy   Adjuvant Radiaiton with Dr Roselind MessierKinard  Site Technique Total Dose (Gy) Dose per Fx (Gy) Completed Fx Beam Energies  Thorax: CW_Rt_Bst_boost Electron 10/10 2 5/5 6E  Thorax: CW_Rt 3D 50/50 2 25/25 10X, 15X, 6X  Thorax: CW_Rt_axilla Complex 50/50 2 25/25 10X    12/23/2019 Survivorship   SCP delivered by Santiago GladLacie Burton, NP    06/02/2020 Imaging   MRI brain  with and without contrast IMPRESSION: 1. No acute intracranial abnormality or evidence of metastatic disease. 2. Mild cerebral white matter T2 signal changes, nonspecific though may reflect chronic small vessel ischemic disease, migraines, or prior infection/inflammation.   06/06/2020 Imaging   Bone scan IMPRESSION: No definite scintigraphic evidence of osseous metastases. Mildly abnormal uptake is noted in both knees and feet most consistent with degenerative change.   03/05/2021 Imaging   CT Chest  IMPRESSION: 1. Resolution of ground-glass in the RIGHT upper lobe that was seen on the previous imaging. Compatible with resolution of inflammation. 2. Tiny basilar nodules at the LEFT lung base unchanged since May of 2021 and seen in hind site on the study of May of 2020, largest 3 mm in the LEFT lower lobe. These are likely benign. 3. Slowly enlarging LEFT internal mammary lymph nodes (less than a cm but increasing in size) the with scattered small lymph nodes throughout the chest. Presence of internal mammary lymph nodes is suspicious given patient history. Could consider short interval follow-up in 3 months or PET imaging for further assessment. Smaller internal mammary lymph nodes are present on the RIGHT. 4. 3 mm calculus in the upper pole the RIGHT kidney. 5. Aortic atherosclerosis.   Aortic Atherosclerosis (ICD10-I70.0).   03/29/2021 PET scan   IMPRESSION: 1. There are multiple hypermetabolic mediastinal, internal mammary and left axillary lymph nodes as described. The intensity of the hypermetabolic activity and distribution of the lymph nodes are suspicious for metastatic disease in  this patient with a history of breast cancer. 2. Solitary hypermetabolic right lower lobe pulmonary nodule, also suspicious for metastasis. 3. Indeterminate small foci of hypermetabolic activity in the left thoracic paraspinal soft tissues. 4. No evidence of metastatic disease in the  abdomen, pelvis or bones. 5. Bilateral nephrolithiasis.   04/10/2021 Imaging   [place] Left Axillary  IMPRESSION: Suspicious left axillary lymph node.   RECOMMENDATION: Ultrasound-guided core needle biopsy suspicious left axillary lymph node.   04/10/2021 Pathology Results   Diagnosis Lymph node, needle/core biopsy, left axillary node - GRANULOMATOUS INFLAMMATION. Microscopic Comment Granulomatous inflammation is epithelioid in nature. The differential diagnosis includes sarcoidosis. Clinical correlation is recommended. Special stains AFB (acid-fast bacillus) and GMS (fungus) will be reported separately. Dr. Charm Barges reviewed the case     History of Present Illness: The patient's records from the referring physician were obtained and reviewed and the patient interviewed to confirm this HPI.  [name]  presents today to review neurologic symptoms.  She describes ~2 week history of dizziness, characterized by "drunk feeling", similar to prior vertigo bouts.  Symptoms come and go, but are exacerbated by bending forward or turning to the left.  Prior vertigo had been brief and self limited.  She also describes tingling sensation in her feet/toes, fingertips, and sometimes the face on both sides.  This goes back several years, but she feels it is worse in the past few weeks.  Gait is affected by the dizziness/vertigo, not the tingling.  Remains on observation for breast cancer with Dr. Mosetta Putt, recently had MRI brain.     Medications: Current Outpatient Medications on File Prior to Visit  Medication Sig Dispense Refill   ibuprofen (ADVIL,MOTRIN) 200 MG tablet Take 600 mg by mouth every 6 (six) hours as needed for moderate pain.      omeprazole (PRILOSEC) 20 MG capsule Take 20 mg by mouth daily.     venlafaxine XR (EFFEXOR-XR) 75 MG 24 hr capsule TAKE 1 CAPSULE BY MOUTH DAILY WITH BREAKFAST. 90 capsule 1   zolpidem (AMBIEN) 5 MG tablet TAKE 1 TABLET BY MOUTH EVERY DAY AT BEDTIME AS NEEDED FOR  SLEEP 30 tablet 3   Cholecalciferol (VITAMIN D3) 125 MCG (5000 UT) CAPS Take 5,000 Units by mouth daily. (Patient not taking: Reported on 03/18/2022)     meclizine (ANTIVERT) 25 MG tablet Take by mouth.     Multiple Vitamin (MULTIVITAMIN WITH MINERALS) TABS tablet Take 1 tablet by mouth daily. (Patient not taking: Reported on 03/18/2022)     No current facility-administered medications on file prior to visit.    Allergies:  Allergies  Allergen Reactions   Codeine Nausea And Vomiting   Tramadol Nausea And Vomiting   Past Medical History:  Past Medical History:  Diagnosis Date   Anemia    Anxiety    Arthritis    Cancer    right breast   Family history of adverse reaction to anesthesia    sister has same issues   GERD (gastroesophageal reflux disease)    Neuromuscular disorder    neuropathy   PONV (postoperative nausea and vomiting)    Past Surgical History:  Past Surgical History:  Procedure Laterality Date   BREAST RECONSTRUCTION WITH PLACEMENT OF TISSUE EXPANDER AND FLEX HD (ACELLULAR HYDRATED DERMIS) Bilateral 02/24/2019   Procedure: BREAST RECONSTRUCTION WITH PLACEMENT OF TISSUE EXPANDER AND FLEX HD (ACELLULAR HYDRATED DERMIS);  Surgeon: Peggye Form, DO;  Location: [facility] OR;  Service: Plastics;  Laterality: Bilateral;   BREAST SURGERY  left breast bx   COLONOSCOPY     ESOPHAGOGASTRODUODENOSCOPY (EGD) WITH ESOPHAGEAL DILATION     EYE SURGERY     dart to the eye  (right)   FACIAL FRACTURE SURGERY     FRACTURE SURGERY     KNEE SURGERY     MASTECTOMY W/ SENTINEL NODE BIOPSY Bilateral 02/24/2019   Procedure: RIGHT MASTECTOMY WITH SENTINEL LYMPH NODE MAPPING AND LEFT PROPHYLACTIC MASTECTOMY;  Surgeon: Griselda Miner, MD;  Location: [facility] OR;  Service: General;  Laterality: Bilateral;   POLYPECTOMY     REMOVAL OF BILATERAL TISSUE EXPANDERS WITH PLACEMENT OF BILATERAL BREAST IMPLANTS Bilateral 06/30/2019   Procedure: REMOVAL OF BILATERAL TISSUE EXPANDERS WITH PLACEMENT OF  BILATERAL BREAST IMPLANTS;  Surgeon: Peggye Form, DO;  Location: [facility];  Service: Plastics;  Laterality: Bilateral;   SHOULDER SURGERY     Social History:  Social History   Socioeconomic History   Marital status: Married    Spouse name: Not on file   Number of children: Not on file   Years of education: Not on file   Highest education level: Not on file  Occupational History   Not on file  Tobacco Use   Smoking status: Never    Passive exposure: Never   Smokeless tobacco: Never  Vaping Use   Vaping Use: Never used  Substance and Sexual Activity   Alcohol use: Yes    Comment: occasional   Drug use: No   Sexual activity: Not on file  Other Topics Concern   Not on file  Social History Narrative   Not on file   Social Determinants of Health   Financial Resource Strain: Not on file  Food Insecurity: No Food Insecurity (03/27/2023)   Hunger Vital Sign    Worried About Running Out of Food in the Last Year: Never true    Ran Out of Food in the Last Year: Never true  Transportation Needs: Not on file  Physical Activity: Not on file  Stress: Not on file  Social Connections: Not on file  Intimate Partner Violence: Not on file   Family History:  Family History  Problem Relation Age of Onset   Brain cancer Father    Cancer Maternal Uncle        melanoma    Cancer Paternal Uncle        gastric cancer   Spinal muscular atrophy Daughter    Spinal muscular atrophy Child    Breast cancer Neg Hx    Colon cancer Neg Hx    Esophageal cancer Neg Hx    Stomach cancer Neg Hx    Rectal cancer Neg Hx    Colon polyps Neg Hx    Crohn's disease Neg Hx     Review of Systems: Constitutional: Doesn't report fevers, chills or abnormal weight loss Eyes: Doesn't report blurriness of vision Ears, nose, mouth, throat, and face: Doesn't report sore throat Respiratory: Doesn't report cough, dyspnea or wheezes Cardiovascular: Doesn't report palpitation, chest  discomfort  Gastrointestinal:  Doesn't report nausea, constipation, diarrhea GU: Doesn't report incontinence Skin: Doesn't report skin rashes Neurological: Per HPI Musculoskeletal: Doesn't report joint pain Behavioral/Psych: Doesn't report anxiety  Physical Exam: Vitals:   03/27/23 1020  BP: (!) 141/87  Pulse: 89  Resp: 16  Temp: 97.9 F (36.6 C)  SpO2: 100%   KPS: 90. General: Alert, cooperative, pleasant, in no acute distress Head: Normal EENT: No conjunctival injection or scleral icterus.  Lungs: Resp effort normal Cardiac: Regular  rate Abdomen: Non-distended abdomen Skin: No rashes cyanosis or petechiae. Extremities: No clubbing or edema  Neurologic Exam: Mental Status: Awake, alert, attentive to examiner. Oriented to self and environment. Language is fluent with intact comprehension.  Cranial Nerves: Visual acuity is grossly normal. Visual fields are full. Extra-ocular movements intact. No ptosis. Face is symmetric Motor: Tone and bulk are normal. Power is full in both arms and legs. Reflexes are symmetric, no pathologic reflexes present.  Sensory: Intact to light touch Gait: Normal.   Labs: I have reviewed the data as listed    Component Value Date/Time   NA 136 03/18/2023 1820   K 3.4 (L) 03/18/2023 1820   CL 105 03/18/2023 1820   CO2 25 03/18/2023 1820   GLUCOSE 102 (H) 03/18/2023 1820   BUN 8 03/18/2023 1820   CREATININE 0.59 03/18/2023 1820   CREATININE 0.58 11/27/2022 1041   CALCIUM 9.3 03/18/2023 1820   PROT 7.0 11/27/2022 1041   ALBUMIN 4.0 11/27/2022 1041   AST 14 (L) 11/27/2022 1041   ALT 11 11/27/2022 1041   ALKPHOS 68 11/27/2022 1041   BILITOT 0.3 11/27/2022 1041   GFRNONAA >60 03/18/2023 1820   GFRNONAA >60 11/27/2022 1041   GFRAA >60 05/14/2020 1725   GFRAA >60 03/22/2020 1441   Lab Results  Component Value Date   WBC 5.2 03/18/2023   NEUTROABS 2.8 11/27/2022   HGB 11.8 (L) 03/18/2023   HCT 35.6 (L) 03/18/2023   MCV 86.4 03/18/2023    PLT 309 03/18/2023    Imaging:  MR Brain W Wo Contrast  Result Date: 03/22/2023 CLINICAL DATA:  57 year old female with headaches and vertigo for 1 week. Breast cancer. EXAM: MRI HEAD WITHOUT AND WITH CONTRAST TECHNIQUE: Multiplanar, multiecho pulse sequences of the brain and surrounding structures were obtained without and with intravenous contrast. CONTRAST:  8mL GADAVIST GADOBUTROL 1 MMOL/ML IV SOLN COMPARISON:  Head CT 03/18/2023.  Previous MRI 06/02/2020. FINDINGS: Brain: Cerebral volume is within normal limits for age. No restricted diffusion to suggest acute infarction. No midline shift, mass effect, evidence of mass lesion, ventriculomegaly, extra-axial collection or acute intracranial hemorrhage. Cervicomedullary junction and pituitary are within normal limits. Mild to moderate for age scattered small subcortical white matter T2 and FLAIR hyperintense foci appear stable since 2021, nonspecific. No cortical encephalomalacia. No chronic cerebral blood products on SWI. Deep gray nuclei, brainstem and cerebellum appear negative. And No abnormal enhancement identified. No dural thickening identified. Vascular: Major intracranial vascular flow voids are stable. Dominant left vertebral artery. Following contrast major dural venous sinuses are enhancing and appear to be patent. Skull and upper cervical spine: Normal visible cervical spine and spinal cord. Visualized bone marrow signal is within normal limits. Sinuses/Orbits: Negative orbits. Paranasal Visualized paranasal sinuses and mastoids are stable and well aerated. Other: Visible internal auditory structures appear normal. Negative visible scalp and face. IMPRESSION: 1. No metastatic disease or acute intracranial abnormality. 2. Stable since 2021. Mild to moderate for age nonspecific cerebral white matter signal changes, most commonly due to chronic small vessel disease. Electronically Signed   By: Odessa Fleming M.D.   On: 03/22/2023 09:20   CT HEAD WO  CONTRAST  Result Date: 03/18/2023 CLINICAL DATA:  Headache. EXAM: CT HEAD WITHOUT CONTRAST TECHNIQUE: Contiguous axial images were obtained from the base of the skull through the vertex without intravenous contrast. RADIATION DOSE REDUCTION: This exam was performed according to the departmental dose-optimization program which includes automated exposure control, adjustment of the mA and/or kV according  to patient size and/or use of iterative reconstruction technique. COMPARISON:  December 18, 2019 FINDINGS: Brain: No evidence of acute infarction, hemorrhage, hydrocephalus, extra-axial collection or mass lesion/mass effect. Vascular: No hyperdense vessel or unexpected calcification. Skull: Normal. Negative for fracture or focal lesion. Sinuses/Orbits: No acute finding. Other: None. IMPRESSION: No acute intracranial pathology. Electronically Signed   By: Aram Candela M.D.   On: 03/18/2023 19:07     Assessment/Plan Vertigo  Other polyneuropathy  [name] presents with clinical syndrome most likely c/w peripheral vertigo.  There is positional predilection, there are no localizing signs concerning for central etiology, and brain MRI study was normal.    We discussed and recommended trial of meclizine 25mg  q8 PRN for vertigo symptoms.  If not improved, referral to ENT for vestibular testing would be appropriate.    For tingling in extremities, this is c/w mild small fiber neuropathy.  Symptoms are longstanding, may have worsened during breast cancer treatments.  This could be cancer associated neuropathy.  TSH, B12, HgbA1C were recently normal.  She is not interested in gabapentin at this time, but will let us know if she changes her mind on that.  We spent twenty additional minutes teaching regarding the natural history, biology, and historical experience in the treatment of neurologic complications of cancer.   We appreciate the opportunity to participate in the care of [name].    All questions were answered. The patient knows to call the clinic with any problems, questions or concerns. No barriers to learning were detected.  The total time spent in the encounter was 40 minutes and more than 50% was on counseling and review of test results   Henreitta Leber, MD Medical Director of Neuro-Oncology [facility] 03/27/23 11:25 AM

## 2023-04-14 ENCOUNTER — Other Ambulatory Visit: Payer: Self-pay | Admitting: Hematology

## 2023-05-05 DIAGNOSIS — H8111 Benign paroxysmal vertigo, right ear: Secondary | ICD-10-CM | POA: Insufficient documentation

## 2023-05-06 ENCOUNTER — Other Ambulatory Visit: Payer: Self-pay | Admitting: Otolaryngology

## 2023-05-06 DIAGNOSIS — R221 Localized swelling, mass and lump, neck: Secondary | ICD-10-CM

## 2023-05-21 ENCOUNTER — Inpatient Hospital Stay: Admission: RE | Admit: 2023-05-21 | Payer: BC Managed Care – PPO | Source: Ambulatory Visit

## 2023-05-25 NOTE — Progress Notes (Unsigned)
Patient Care Team: Bernita Buffy as PCP - General (Physician Assistant) Griselda Miner, MD as Consulting Physician (General Surgery) Malachy Mood, MD as Consulting Physician (Hematology) Antony Blackbird, MD as Consulting Physician (Radiation Oncology) Donnelly Angelica, RN as Oncology Nurse Navigator Pershing Proud, RN as Oncology Nurse Navigator Pollyann Samples, NP as Nurse Practitioner (Nurse Practitioner)   CHIEF COMPLAINT: Follow up right breast cancer   Oncology History Overview Note  Cancer Staging Malignant neoplasm of upper-outer quadrant of right breast in female, estrogen receptor positive [facility]) Staging form: Breast, AJCC 8th Edition - Clinical stage from 01/15/2019: Stage IA (cT1b, cN0, cM0, G1, ER+, PR-, HER2-) - Signed by Malachy Mood, MD on 01/26/2019 - Pathologic stage from 02/24/2019: Stage IB (pT1a, pN1a, cM0, G1, ER+, PR-, HER2-) - Signed by Malachy Mood, MD on 03/18/2019     Malignant neoplasm of upper-outer quadrant of right breast in female, estrogen receptor positive (HCC)  01/15/2019 Cancer Staging   Staging form: Breast, AJCC 8th Edition - Clinical stage from 01/15/2019: Stage IA (cT1b, cN0, cM0, G1, ER+, PR-, HER2-) - Signed by Malachy Mood, MD on 01/26/2019   01/15/2019 Mammogram   Diagnostic Mammogram 01/15/19  IMPRESSION Targeted ultrasound is performed, showing right breast 10 o'clock 5 cm from the nipple hypoechoic round mass measuring 0.7 x 0.5 x 0.6 cm. Adjacent to it there is a probable complicated cyst measuring 4 mm. There is no evidence of right axillary lymphadenopathy.   01/15/2019 Initial Biopsy   Diagnosis 01/15/19  Breast, right, needle core biopsy, upper outer - INVASIVE AND IN SITU MAMMARY CARCINOMA WITH CALCIFICATIONS. - SEE COMMENT. 1 of 3 FINAL for [name], [name] ([id number]) Microscopic Comment The carcinoma appears grade I. The malignant cells are positive for cytokeratin AE1/AE3 and negative for E-cadherin. A breast prognostic  profile will be performed and the results reported separately. Dr Valinda Hoar has reviewed the case and concurs with this interpretation. The results are called to [facility] on 01/18/2019. (JBK:ah:ecj 01/18/19)   01/15/2019 Receptors her2   PROGNOSTIC INDICATORS Results: IMMUNOHISTOCHEMICAL AND MORPHOMETRIC ANALYSIS PERFORMED MANUALLY The tumor cells are NEGATIVE for Her2 (0). Estrogen Receptor: 10%, POSITIVE, MODERATE STAINING INTENSITY Progesterone Receptor: 0%, NEGATIVE Proliferation Marker Ki67: <1%   01/21/2019 Initial Diagnosis   Malignant neoplasm of upper-outer quadrant of right breast in female, estrogen receptor positive (HCC)   01/29/2019 Breast MRI   IMPRESSION: 1. The biopsy-proven site of malignancy in the upper-outer quadrant of the right breast resides centrally with in an area of suspicious non mass enhancement spanning 6-7 cm.   2. There is a 6 mm suspicious enhancing mass in the lower-inner quadrant of the right breast, posterior depth.   3. There is a 9 mm indeterminate enhancing mass in the lower outer quadrant of the right breast.   4.  No evidence of malignancy in the left breast.   02/24/2019 Surgery   RIGHT MASTECTOMY WITH SENTINEL LYMPH NODE MAPPING AND LEFT PROPHYLACTIC MASTECTOMY by Dr. Carolynne Edouard  BREAST RECONSTRUCTION WITH PLACEMENT OF TISSUE EXPANDER by Dr. Ulice Bold  02/24/19    02/24/2019 Pathology Results   Diagnosis 1. Breast, simple mastectomy, Left - BENIGN BREAST PARENCHYMA WITH FIBROCYSTIC CHANGE. - NEGATIVE FOR CARCINOMA. 2. Breast, simple mastectomy, Right - LOBULAR CARCINOMA IN SITU, INTERMEDIATE TO HIGH NUCLEAR GRADE. SEE NOTE. - NO EVIDENCE OF RESIDUAL INVASIVE CARCINOMA. - RESECTION MARGINS ARE NEGATIVE FOR IN SITU OR INVASIVE CARCINOMA. - BIOPSY SITE CHANGES. - SEE ONCOLOGY TABLE. 3.  Lymph node, sentinel, biopsy, Right #1 - IMMUNOHISTOCHEMICAL STAIN FOR AE1/AE3 IS NEGATIVE FOR CARCINOMA (0/1). 4. Lymph node, sentinel,  biopsy, Right - IMMUNOHISTOCHEMICAL STAIN FOR AE1/AE3 IS NEGATIVE FOR CARCINOMA (0/1). 5. Lymph node, sentinel, biopsy, Right #2 - IMMUNOHISTOCHEMICAL STAIN FOR AE1/AE3 IS NEGATIVE FOR CARCINOMA (0/1). 6. Lymph node, sentinel, biopsy, Right - IMMUNOHISTOCHEMICAL STAIN FOR AE1/AE3 IS NEGATIVE FOR CARCINOMA (0/1). 7. Lymph node, sentinel, biopsy, Right - METASTATIC CARCINOMA TO ONE LYMPH NODE (1/1) (CONFIRMED WITH IMMUNOHISTOCHEMICAL STAIN FOR AE1/AE3) - FOCUS OF METASTATIC CARCINOMA MEASURES JUST OVER 0.2 CM IN GREATEST DIMENSION - NO EVIDENCE OF EXTRANODAL EXTENSION   02/24/2019 Cancer Staging   Staging form: Breast, AJCC 8th Edition - Pathologic stage from 02/24/2019: Stage IB (pT1a, pN1a, cM0, G1, ER+, PR-, HER2-) - Signed by Malachy Mood, MD on 03/18/2019   04/01/2019 - 06/03/2019 Chemotherapy   adjuvant Taxol and Carboplatin every 3 weeks for 4 cycles starting in 2 weeks starting 04/01/19-06/03/19   06/2019 - 03/2020 Anti-estrogen oral therapy   Anastrozole 1mg  daily starting 06/2019, held after 2 weeks due to hot flashes and joint pain and RT. Switched to Exemestane in 10/2019. Switched to Exemestane in 10/2019, ultimately changed to letrozole in 01/2020 due to worsening joint pain. Ultimately stopped anti-estrogen therapies in 03/2020 due to arthralgias    06/30/2019 Surgery   REMOVAL OF BILATERAL TISSUE EXPANDERS WITH PLACEMENT OF BILATERAL BREAST IMPLANTS by Dr Ulice Bold  06/30/19    08/17/2019 - 09/28/2019 Radiation Therapy   Adjuvant Radiaiton with Dr Roselind Messier  Site Technique Total Dose (Gy) Dose per Fx (Gy) Completed Fx Beam Energies  Thorax: CW_Rt_Bst_boost Electron 10/10 2 5/5 6E  Thorax: CW_Rt 3D 50/50 2 25/25 10X, 15X, 6X  Thorax: CW_Rt_axilla Complex 50/50 2 25/25 10X    12/23/2019 Survivorship   SCP delivered by Santiago Glad, NP    06/02/2020 Imaging   MRI brain with and without contrast IMPRESSION: 1. No acute intracranial abnormality or evidence of metastatic disease. 2. Mild  cerebral white matter T2 signal changes, nonspecific though may reflect chronic small vessel ischemic disease, migraines, or prior infection/inflammation.   06/06/2020 Imaging   Bone scan IMPRESSION: No definite scintigraphic evidence of osseous metastases. Mildly abnormal uptake is noted in both knees and feet most consistent with degenerative change.   03/05/2021 Imaging   CT Chest  IMPRESSION: 1. Resolution of ground-glass in the RIGHT upper lobe that was seen on the previous imaging. Compatible with resolution of inflammation. 2. Tiny basilar nodules at the LEFT lung base unchanged since May of 2021 and seen in hind site on the study of May of 2020, largest 3 mm in the LEFT lower lobe. These are likely benign. 3. Slowly enlarging LEFT internal mammary lymph nodes (less than a cm but increasing in size) the with scattered small lymph nodes throughout the chest. Presence of internal mammary lymph nodes is suspicious given patient history. Could consider short interval follow-up in 3 months or PET imaging for further assessment. Smaller internal mammary lymph nodes are present on the RIGHT. 4. 3 mm calculus in the upper pole the RIGHT kidney. 5. Aortic atherosclerosis.   Aortic Atherosclerosis (ICD10-I70.0).   03/29/2021 PET scan   IMPRESSION: 1. There are multiple hypermetabolic mediastinal, internal mammary and left axillary lymph nodes as described. The intensity of the hypermetabolic activity and distribution of the lymph nodes are suspicious for metastatic disease in this patient with a history of breast cancer. 2. Solitary hypermetabolic right lower lobe pulmonary nodule, also suspicious for metastasis. 3.  Indeterminate small foci of hypermetabolic activity in the left thoracic paraspinal soft tissues. 4. No evidence of metastatic disease in the abdomen, pelvis or bones. 5. Bilateral nephrolithiasis.   04/10/2021 Imaging   [place] Left Axillary  IMPRESSION: Suspicious  left axillary lymph node.   RECOMMENDATION: Ultrasound-guided core needle biopsy suspicious left axillary lymph node.   04/10/2021 Pathology Results   Diagnosis Lymph node, needle/core biopsy, left axillary node - GRANULOMATOUS INFLAMMATION. Microscopic Comment Granulomatous inflammation is epithelioid in nature. The differential diagnosis includes sarcoidosis. Clinical correlation is recommended. Special stains AFB (acid-fast bacillus) and GMS (fungus) will be reported separately. Dr. Charm Barges reviewed the case      CURRENT THERAPY: Surveillance   INTERVAL HISTORY [name] returns for follow up as scheduled. Last seen by Dr. Mosetta Putt 03/21/23, she was having neuro symptoms headache, dizziness, and blurred vision and MRI was done which was negative. She was referred to Dr. Barbaraann Cao who is managing her vertigo.   ROS   Past Medical History:  Diagnosis Date   Anemia    Anxiety    Arthritis    Cancer (HCC)    right breast   Family history of adverse reaction to anesthesia    sister has same issues   GERD (gastroesophageal reflux disease)    Neuromuscular disorder (HCC)    neuropathy   PONV (postoperative nausea and vomiting)      Past Surgical History:  Procedure Laterality Date   BREAST RECONSTRUCTION WITH PLACEMENT OF TISSUE EXPANDER AND FLEX HD (ACELLULAR HYDRATED DERMIS) Bilateral 02/24/2019   Procedure: BREAST RECONSTRUCTION WITH PLACEMENT OF TISSUE EXPANDER AND FLEX HD (ACELLULAR HYDRATED DERMIS);  Surgeon: Peggye Form, DO;  Location: [facility] OR;  Service: Plastics;  Laterality: Bilateral;   BREAST SURGERY     left breast bx   COLONOSCOPY     ESOPHAGOGASTRODUODENOSCOPY (EGD) WITH ESOPHAGEAL DILATION     EYE SURGERY     dart to the eye  (right)   FACIAL FRACTURE SURGERY     FRACTURE SURGERY     KNEE SURGERY     MASTECTOMY W/ SENTINEL NODE BIOPSY Bilateral 02/24/2019   Procedure: RIGHT MASTECTOMY WITH SENTINEL LYMPH NODE MAPPING AND LEFT PROPHYLACTIC MASTECTOMY;   Surgeon: Griselda Miner, MD;  Location: [facility] OR;  Service: General;  Laterality: Bilateral;   POLYPECTOMY     REMOVAL OF BILATERAL TISSUE EXPANDERS WITH PLACEMENT OF BILATERAL BREAST IMPLANTS Bilateral 06/30/2019   Procedure: REMOVAL OF BILATERAL TISSUE EXPANDERS WITH PLACEMENT OF BILATERAL BREAST IMPLANTS;  Surgeon: Peggye Form, DO;  Location: [facility];  Service: Plastics;  Laterality: Bilateral;   SHOULDER SURGERY       Outpatient Encounter Medications as of 05/26/2023  Medication Sig   Cholecalciferol (VITAMIN D3) 125 MCG (5000 UT) CAPS Take 5,000 Units by mouth daily. (Patient not taking: Reported on 03/18/2022)   ibuprofen (ADVIL,MOTRIN) 200 MG tablet Take 600 mg by mouth every 6 (six) hours as needed for moderate pain.    Multiple Vitamin (MULTIVITAMIN WITH MINERALS) TABS tablet Take 1 tablet by mouth daily. (Patient not taking: Reported on 03/18/2022)   omeprazole (PRILOSEC) 20 MG capsule Take 20 mg by mouth daily.   venlafaxine XR (EFFEXOR-XR) 75 MG 24 hr capsule TAKE 1 CAPSULE BY MOUTH DAILY WITH BREAKFAST.   zolpidem (AMBIEN) 5 MG tablet TAKE 1 TABLET BY MOUTH AT BEDTIME AS NEEDED FOR SLEEP   No facility-administered encounter medications on file as of 05/26/2023.     There were no vitals filed for this  visit. There is no height or weight on file to calculate BMI.   PHYSICAL EXAM GENERAL:alert, no distress and comfortable SKIN: no rash  EYES: sclera clear NECK: without mass LYMPH:  no palpable cervical or supraclavicular lymphadenopathy  LUNGS: clear with normal breathing effort HEART: regular rate & rhythm, no lower extremity edema ABDOMEN: abdomen soft, non-tender and normal bowel sounds NEURO: alert & oriented x 3 with fluent speech, no focal motor/sensory deficits Breast exam:  PAC without erythema    CBC    Component Value Date/Time   WBC 5.2 03/18/2023 1820   RBC 4.12 03/18/2023 1820   HGB 11.8 (L) 03/18/2023 1820   HGB 12.0 11/27/2022 1041    HCT 35.6 (L) 03/18/2023 1820   PLT 309 03/18/2023 1820   PLT 266 11/27/2022 1041   MCV 86.4 03/18/2023 1820   MCH 28.6 03/18/2023 1820   MCHC 33.1 03/18/2023 1820   RDW 13.1 03/18/2023 1820   LYMPHSABS 1.1 11/27/2022 1041   MONOABS 0.5 11/27/2022 1041   EOSABS 0.1 11/27/2022 1041   BASOSABS 0.1 11/27/2022 1041     CMP     Component Value Date/Time   NA 136 03/18/2023 1820   K 3.4 (L) 03/18/2023 1820   CL 105 03/18/2023 1820   CO2 25 03/18/2023 1820   GLUCOSE 102 (H) 03/18/2023 1820   BUN 8 03/18/2023 1820   CREATININE 0.59 03/18/2023 1820   CREATININE 0.58 11/27/2022 1041   CALCIUM 9.3 03/18/2023 1820   PROT 7.0 11/27/2022 1041   ALBUMIN 4.0 11/27/2022 1041   AST 14 (L) 11/27/2022 1041   ALT 11 11/27/2022 1041   ALKPHOS 68 11/27/2022 1041   BILITOT 0.3 11/27/2022 1041   GFRNONAA >60 03/18/2023 1820   GFRNONAA >60 11/27/2022 1041   GFRAA >60 05/14/2020 1725   GFRAA >60 03/22/2020 1441     ASSESSMENT & PLAN:57 year old female   1. Malignant neoplasm of upper-outer quadrant of right breast, invasive lobular carcinoma, stage IA, Snyder(T1a,N1a,M0), ER 10%+, PR-, HER2- Grade I   -Diagnosed in 12/2018. S/Snyder b/l mastectomy on 02/24/19. Pathology showed 1/5 + LNs, margins negative -She completed adjuvant chemo with TC every 3 weeks for 4 cycles.  -She underwent breast reconstruction with implant placements on 06/30/19.  S/Snyder adjuvant radiation with Dr. Roselind Messier 08/17/2019 - 09/28/2019 -She tried antiestrogen therapies with Anastrozole, exemestane, and letrozole from 06/2019 - 03/2020. -on breast cancer surveillance  -On surveillance she developed headaches and bone pain, brain MRI 06/02/20 and bone scan 06/06/20 negative -   2.  Sarcoidosis -Work-up for chest pain showed enlarging mammary and thoracic lymph nodes, PET scan 03/29/2021 showed multiple hypermetabolic mediastinal, internal mammary, and left axillary lymph nodes, the largest was subcarinal lymph node measuring 1.5 cm.   -She  also had a solitary hypermetabolic 8 mm right lower lobe nodule, and indeterminate small foci of hypermetabolic activity in the left thoracic paraspinal soft tissue -Left axillary lymph node biopsy 04/10/2021 showed granulomatous inflammation consistent with sarcoidosis -Repeat CT chest 09/27/2021 showed stable bilateral pulmonary nodules and decreased size of left internal mammary lymph node -Followed by Dr. Everardo All of pulmonology   3.  Social -Her daughter has been on a ventilator for most of her life, she passed away in Oct 27, 2021-Patient's husband was recently diagnosed with metastatic prostate cancer to the bone, currently a patient of Dr. Clelia Croft -I offered her emotional support and active listening, I reviewed our available supportive care resources    PLAN:  No orders of the defined types  were placed in this encounter.     All questions were answered. The patient knows to call the clinic with any problems, questions or concerns. No barriers to learning were detected. I spent *** counseling the patient face to face. The total time spent in the appointment was *** and more than 50% was on counseling, review of test results, and coordination of care.   Santiago Glad, NP-C @DATE @

## 2023-05-26 ENCOUNTER — Inpatient Hospital Stay: Payer: BC Managed Care – PPO | Attending: Hematology

## 2023-05-26 ENCOUNTER — Other Ambulatory Visit: Payer: Self-pay

## 2023-05-26 ENCOUNTER — Encounter: Payer: Self-pay | Admitting: Nurse Practitioner

## 2023-05-26 ENCOUNTER — Inpatient Hospital Stay: Payer: BC Managed Care – PPO | Admitting: Nurse Practitioner

## 2023-05-26 VITALS — BP 124/77 | HR 77 | Temp 98.8°F | Resp 15 | Wt 177.8 lb

## 2023-05-26 DIAGNOSIS — Z79811 Long term (current) use of aromatase inhibitors: Secondary | ICD-10-CM | POA: Diagnosis not present

## 2023-05-26 DIAGNOSIS — Z79899 Other long term (current) drug therapy: Secondary | ICD-10-CM | POA: Insufficient documentation

## 2023-05-26 DIAGNOSIS — N200 Calculus of kidney: Secondary | ICD-10-CM | POA: Diagnosis not present

## 2023-05-26 DIAGNOSIS — C50411 Malignant neoplasm of upper-outer quadrant of right female breast: Secondary | ICD-10-CM | POA: Insufficient documentation

## 2023-05-26 DIAGNOSIS — R918 Other nonspecific abnormal finding of lung field: Secondary | ICD-10-CM | POA: Diagnosis not present

## 2023-05-26 DIAGNOSIS — Z170 Estrogen receptor positive status [ER+]: Secondary | ICD-10-CM | POA: Insufficient documentation

## 2023-05-26 DIAGNOSIS — D869 Sarcoidosis, unspecified: Secondary | ICD-10-CM | POA: Diagnosis not present

## 2023-05-26 DIAGNOSIS — Z9013 Acquired absence of bilateral breasts and nipples: Secondary | ICD-10-CM | POA: Insufficient documentation

## 2023-05-26 DIAGNOSIS — K219 Gastro-esophageal reflux disease without esophagitis: Secondary | ICD-10-CM | POA: Diagnosis not present

## 2023-05-26 DIAGNOSIS — I700 Atherosclerosis of aorta: Secondary | ICD-10-CM | POA: Insufficient documentation

## 2023-05-26 DIAGNOSIS — M2550 Pain in unspecified joint: Secondary | ICD-10-CM | POA: Insufficient documentation

## 2023-05-26 LAB — CMP (CANCER CENTER ONLY)
ALT: 13 U/L (ref 0–44)
AST: 17 U/L (ref 15–41)
Albumin: 4.1 g/dL (ref 3.5–5.0)
Alkaline Phosphatase: 74 U/L (ref 38–126)
Anion gap: 6 (ref 5–15)
BUN: 10 mg/dL (ref 6–20)
CO2: 27 mmol/L (ref 22–32)
Calcium: 10 mg/dL (ref 8.9–10.3)
Chloride: 108 mmol/L (ref 98–111)
Creatinine: 0.71 mg/dL (ref 0.44–1.00)
GFR, Estimated: >60 mL/min (ref >60)
Glucose, Bld: 73 mg/dL (ref 70–99)
Potassium: 3.6 mmol/L (ref 3.5–5.1)
Sodium: 141 mmol/L (ref 135–145)
Total Bilirubin: 0.3 mg/dL (ref 0.3–1.2)
Total Protein: 7.4 g/dL (ref 6.5–8.1)

## 2023-05-26 LAB — CBC WITH DIFFERENTIAL (CANCER CENTER ONLY)
Abs Immature Granulocytes: 0.01 10*3/uL (ref 0.00–0.07)
Basophils Absolute: 0.1 10*3/uL (ref 0.0–0.1)
Basophils Relative: 1 %
Eosinophils Absolute: 0.1 10*3/uL (ref 0.0–0.5)
Eosinophils Relative: 2 %
HCT: 34.7 % — ABNORMAL LOW (ref 36.0–46.0)
Hemoglobin: 11.9 g/dL — ABNORMAL LOW (ref 12.0–15.0)
Immature Granulocytes: 0 %
Lymphocytes Relative: 24 %
Lymphs Abs: 1.1 10*3/uL (ref 0.7–4.0)
MCH: 29.7 pg (ref 26.0–34.0)
MCHC: 34.3 g/dL (ref 30.0–36.0)
MCV: 86.5 fL (ref 80.0–100.0)
Monocytes Absolute: 0.7 10*3/uL (ref 0.1–1.0)
Monocytes Relative: 15 %
Neutro Abs: 2.5 10*3/uL (ref 1.7–7.7)
Neutrophils Relative %: 58 %
Platelet Count: 269 10*3/uL (ref 150–400)
RBC: 4.01 MIL/uL (ref 3.87–5.11)
RDW: 12.7 % (ref 11.5–15.5)
WBC Count: 4.4 10*3/uL (ref 4.0–10.5)
nRBC: 0 % (ref 0.0–0.2)

## 2023-05-27 LAB — CANCER ANTIGEN 27.29: CA 27.29: 24.4 U/mL (ref 0.0–38.6)

## 2023-06-06 ENCOUNTER — Other Ambulatory Visit (HOSPITAL_COMMUNITY): Payer: Self-pay | Admitting: Otolaryngology

## 2023-06-06 DIAGNOSIS — R221 Localized swelling, mass and lump, neck: Secondary | ICD-10-CM

## 2023-06-07 ENCOUNTER — Other Ambulatory Visit: Payer: Self-pay | Admitting: Hematology

## 2023-09-05 ENCOUNTER — Other Ambulatory Visit: Payer: Self-pay | Admitting: Hematology

## 2023-10-15 ENCOUNTER — Other Ambulatory Visit: Payer: Self-pay | Admitting: Hematology

## 2023-10-15 MED ORDER — ZOLPIDEM TARTRATE 5 MG PO TABS: ORAL_TABLET | ORAL | 2 refills | Status: DC

## 2023-11-04 ENCOUNTER — Encounter: Payer: Self-pay | Admitting: Obstetrics and Gynecology

## 2023-11-23 NOTE — Progress Notes (Unsigned)
Patient Care Team: Bernita Buffy as PCP - General (Physician Assistant) Griselda Miner, MD as Consulting Physician (General Surgery) Malachy Mood, MD as Consulting Physician (Hematology) Antony Blackbird, MD as Consulting Physician (Radiation Oncology) Donnelly Angelica, RN as Oncology Nurse Navigator Pershing Proud, RN as Oncology Nurse Navigator Pollyann Samples, NP as Nurse Practitioner (Nurse Practitioner)   CHIEF COMPLAINT: Follow up right breast cancer   Oncology History Overview Note  Cancer Staging Malignant neoplasm of upper-outer quadrant of right breast in female, estrogen receptor positive [facility]) Staging form: Breast, AJCC 8th Edition - Clinical stage from 01/15/2019: Stage IA (cT1b, cN0, cM0, G1, ER+, PR-, HER2-) - Signed by Malachy Mood, MD on 01/26/2019 - Pathologic stage from 02/24/2019: Stage IB (pT1a, pN1a, cM0, G1, ER+, PR-, HER2-) - Signed by Malachy Mood, MD on 03/18/2019     Malignant neoplasm of upper-outer quadrant of right breast in female, estrogen receptor positive (HCC)  01/15/2019 Cancer Staging   Staging form: Breast, AJCC 8th Edition - Clinical stage from 01/15/2019: Stage IA (cT1b, cN0, cM0, G1, ER+, PR-, HER2-) - Signed by Malachy Mood, MD on 01/26/2019   01/15/2019 Mammogram   Diagnostic Mammogram 01/15/19  IMPRESSION Targeted ultrasound is performed, showing right breast 10 o'clock 5 cm from the nipple hypoechoic round mass measuring 0.7 x 0.5 x 0.6 cm. Adjacent to it there is a probable complicated cyst measuring 4 mm. There is no evidence of right axillary lymphadenopathy.   01/15/2019 Initial Biopsy   Diagnosis 01/15/19  Breast, right, needle core biopsy, upper outer - INVASIVE AND IN SITU MAMMARY CARCINOMA WITH CALCIFICATIONS. - SEE COMMENT. 1 of 3 FINAL for Angerer, [name] ([id number]) Microscopic Comment The carcinoma appears grade I. The malignant cells are positive for cytokeratin AE1/AE3 and negative for E-cadherin. A breast prognostic  profile will be performed and the results reported separately. Dr Valinda Hoar has reviewed the case and concurs with this interpretation. The results are called to [facility] on 01/18/2019. (JBK:ah:ecj 01/18/19)   01/15/2019 Receptors her2   PROGNOSTIC INDICATORS Results: IMMUNOHISTOCHEMICAL AND MORPHOMETRIC ANALYSIS PERFORMED MANUALLY The tumor cells are NEGATIVE for Her2 (0). Estrogen Receptor: 10%, POSITIVE, MODERATE STAINING INTENSITY Progesterone Receptor: 0%, NEGATIVE Proliferation Marker Ki67: <1%   01/21/2019 Initial Diagnosis   Malignant neoplasm of upper-outer quadrant of right breast in female, estrogen receptor positive (HCC)   01/29/2019 Breast MRI   IMPRESSION: 1. The biopsy-proven site of malignancy in the upper-outer quadrant of the right breast resides centrally with in an area of suspicious non mass enhancement spanning 6-7 cm.   2. There is a 6 mm suspicious enhancing mass in the lower-inner quadrant of the right breast, posterior depth.   3. There is a 9 mm indeterminate enhancing mass in the lower outer quadrant of the right breast.   4.  No evidence of malignancy in the left breast.   02/24/2019 Surgery   RIGHT MASTECTOMY WITH SENTINEL LYMPH NODE MAPPING AND LEFT PROPHYLACTIC MASTECTOMY by Dr. Carolynne Edouard  BREAST RECONSTRUCTION WITH PLACEMENT OF TISSUE EXPANDER by Dr. Ulice Bold  02/24/19    02/24/2019 Pathology Results   Diagnosis 1. Breast, simple mastectomy, Left - BENIGN BREAST PARENCHYMA WITH FIBROCYSTIC CHANGE. - NEGATIVE FOR CARCINOMA. 2. Breast, simple mastectomy, Right - LOBULAR CARCINOMA IN SITU, INTERMEDIATE TO HIGH NUCLEAR GRADE. SEE NOTE. - NO EVIDENCE OF RESIDUAL INVASIVE CARCINOMA. - RESECTION MARGINS ARE NEGATIVE FOR IN SITU OR INVASIVE CARCINOMA. - BIOPSY SITE CHANGES. - SEE ONCOLOGY TABLE. 3.  Lymph node, sentinel, biopsy, Right #1 - IMMUNOHISTOCHEMICAL STAIN FOR AE1/AE3 IS NEGATIVE FOR CARCINOMA (0/1). 4. Lymph node, sentinel,  biopsy, Right - IMMUNOHISTOCHEMICAL STAIN FOR AE1/AE3 IS NEGATIVE FOR CARCINOMA (0/1). 5. Lymph node, sentinel, biopsy, Right #2 - IMMUNOHISTOCHEMICAL STAIN FOR AE1/AE3 IS NEGATIVE FOR CARCINOMA (0/1). 6. Lymph node, sentinel, biopsy, Right - IMMUNOHISTOCHEMICAL STAIN FOR AE1/AE3 IS NEGATIVE FOR CARCINOMA (0/1). 7. Lymph node, sentinel, biopsy, Right - METASTATIC CARCINOMA TO ONE LYMPH NODE (1/1) (CONFIRMED WITH IMMUNOHISTOCHEMICAL STAIN FOR AE1/AE3) - FOCUS OF METASTATIC CARCINOMA MEASURES JUST OVER 0.2 CM IN GREATEST DIMENSION - NO EVIDENCE OF EXTRANODAL EXTENSION   02/24/2019 Cancer Staging   Staging form: Breast, AJCC 8th Edition - Pathologic stage from 02/24/2019: Stage IB (pT1a, pN1a, cM0, G1, ER+, PR-, HER2-) - Signed by Malachy Mood, MD on 03/18/2019   04/01/2019 - 06/03/2019 Chemotherapy   adjuvant Taxol and Carboplatin every 3 weeks for 4 cycles starting in 2 weeks starting 04/01/19-06/03/19   06/2019 - 03/2020 Anti-estrogen oral therapy   Anastrozole 1mg  daily starting 06/2019, held after 2 weeks due to hot flashes and joint pain and RT. Switched to Exemestane in 10/2019. Switched to Exemestane in 10/2019, ultimately changed to letrozole in 01/2020 due to worsening joint pain. Ultimately stopped anti-estrogen therapies in 03/2020 due to arthralgias    06/30/2019 Surgery   REMOVAL OF BILATERAL TISSUE EXPANDERS WITH PLACEMENT OF BILATERAL BREAST IMPLANTS by Dr Ulice Bold  06/30/19    08/17/2019 - 09/28/2019 Radiation Therapy   Adjuvant Radiaiton with Dr Roselind Messier  Site Technique Total Dose (Gy) Dose per Fx (Gy) Completed Fx Beam Energies  Thorax: CW_Rt_Bst_boost Electron 10/10 2 5/5 6E  Thorax: CW_Rt 3D 50/50 2 25/25 10X, 15X, 6X  Thorax: CW_Rt_axilla Complex 50/50 2 25/25 10X     12/23/2019 Survivorship   SCP delivered by Santiago Glad, NP    06/02/2020 Imaging   MRI brain with and without contrast IMPRESSION: 1. No acute intracranial abnormality or evidence of metastatic disease. 2. Mild  cerebral white matter T2 signal changes, nonspecific though may reflect chronic small vessel ischemic disease, migraines, or prior infection/inflammation.   06/06/2020 Imaging   Bone scan IMPRESSION: No definite scintigraphic evidence of osseous metastases. Mildly abnormal uptake is noted in both knees and feet most consistent with degenerative change.   03/05/2021 Imaging   CT Chest  IMPRESSION: 1. Resolution of ground-glass in the RIGHT upper lobe that was seen on the previous imaging. Compatible with resolution of inflammation. 2. Tiny basilar nodules at the LEFT lung base unchanged since May of 2021 and seen in hind site on the study of May of 2020, largest 3 mm in the LEFT lower lobe. These are likely benign. 3. Slowly enlarging LEFT internal mammary lymph nodes (less than a cm but increasing in size) the with scattered small lymph nodes throughout the chest. Presence of internal mammary lymph nodes is suspicious given patient history. Could consider short interval follow-up in 3 months or PET imaging for further assessment. Smaller internal mammary lymph nodes are present on the RIGHT. 4. 3 mm calculus in the upper pole the RIGHT kidney. 5. Aortic atherosclerosis.   Aortic Atherosclerosis (ICD10-I70.0).   03/29/2021 PET scan   IMPRESSION: 1. There are multiple hypermetabolic mediastinal, internal mammary and left axillary lymph nodes as described. The intensity of the hypermetabolic activity and distribution of the lymph nodes are suspicious for metastatic disease in this patient with a history of breast cancer. 2. Solitary hypermetabolic right lower lobe pulmonary nodule, also suspicious for metastasis.  3. Indeterminate small foci of hypermetabolic activity in the left thoracic paraspinal soft tissues. 4. No evidence of metastatic disease in the abdomen, pelvis or bones. 5. Bilateral nephrolithiasis.   04/10/2021 Imaging   [place] Left Axillary  IMPRESSION: Suspicious  left axillary lymph node.   RECOMMENDATION: Ultrasound-guided core needle biopsy suspicious left axillary lymph node.   04/10/2021 Pathology Results   Diagnosis Lymph node, needle/core biopsy, left axillary node - GRANULOMATOUS INFLAMMATION. Microscopic Comment Granulomatous inflammation is epithelioid in nature. The differential diagnosis includes sarcoidosis. Clinical correlation is recommended. Special stains AFB (acid-fast bacillus) and GMS (fungus) will be reported separately. Dr. Charm Barges reviewed the case      CURRENT THERAPY: Surveillance   INTERVAL HISTORY [name] returns for follow up as scheduled. Last seen by me 05/26/23.   ROS   Past Medical History:  Diagnosis Date   Anemia    Anxiety    Arthritis    Cancer (HCC)    right breast   Family history of adverse reaction to anesthesia    sister has same issues   GERD (gastroesophageal reflux disease)    Neuromuscular disorder (HCC)    neuropathy   PONV (postoperative nausea and vomiting)      Past Surgical History:  Procedure Laterality Date   BREAST RECONSTRUCTION WITH PLACEMENT OF TISSUE EXPANDER AND FLEX HD (ACELLULAR HYDRATED DERMIS) Bilateral 02/24/2019   Procedure: BREAST RECONSTRUCTION WITH PLACEMENT OF TISSUE EXPANDER AND FLEX HD (ACELLULAR HYDRATED DERMIS);  Surgeon: Peggye Form, DO;  Location: [facility] OR;  Service: Plastics;  Laterality: Bilateral;   BREAST SURGERY     left breast bx   COLONOSCOPY     ESOPHAGOGASTRODUODENOSCOPY (EGD) WITH ESOPHAGEAL DILATION     EYE SURGERY     dart to the eye  (right)   FACIAL FRACTURE SURGERY     FRACTURE SURGERY     KNEE SURGERY     MASTECTOMY W/ SENTINEL NODE BIOPSY Bilateral 02/24/2019   Procedure: RIGHT MASTECTOMY WITH SENTINEL LYMPH NODE MAPPING AND LEFT PROPHYLACTIC MASTECTOMY;  Surgeon: Griselda Miner, MD;  Location: [facility] OR;  Service: General;  Laterality: Bilateral;   POLYPECTOMY     REMOVAL OF BILATERAL TISSUE EXPANDERS WITH PLACEMENT OF BILATERAL  BREAST IMPLANTS Bilateral 06/30/2019   Procedure: REMOVAL OF BILATERAL TISSUE EXPANDERS WITH PLACEMENT OF BILATERAL BREAST IMPLANTS;  Surgeon: Peggye Form, DO;  Location: [facility];  Service: Plastics;  Laterality: Bilateral;   SHOULDER SURGERY       Outpatient Encounter Medications as of 11/24/2023  Medication Sig   Cholecalciferol (VITAMIN D3) 125 MCG (5000 UT) CAPS Take 5,000 Units by mouth daily. (Patient not taking: Reported on 03/18/2022)   ibuprofen (ADVIL,MOTRIN) 200 MG tablet Take 600 mg by mouth every 6 (six) hours as needed for moderate pain.    Multiple Vitamin (MULTIVITAMIN WITH MINERALS) TABS tablet Take 1 tablet by mouth daily. (Patient not taking: Reported on 03/18/2022)   omeprazole (PRILOSEC) 20 MG capsule Take 20 mg by mouth daily.   venlafaxine XR (EFFEXOR-XR) 75 MG 24 hr capsule TAKE 1 CAPSULE BY MOUTH DAILY WITH BREAKFAST.   zolpidem (AMBIEN) 5 MG tablet TAKE 1 TABLET BY MOUTH EVERY DAY AT BEDTIME AS NEEDED FOR SLEEP   No facility-administered encounter medications on file as of 11/24/2023.     There were no vitals filed for this visit. There is no height or weight on file to calculate BMI.   PHYSICAL EXAM GENERAL:alert, no distress and comfortable SKIN: no rash  EYES:  sclera clear NECK: without mass LYMPH:  no palpable cervical or supraclavicular lymphadenopathy  LUNGS: clear with normal breathing effort HEART: regular rate & rhythm, no lower extremity edema ABDOMEN: abdomen soft, non-tender and normal bowel sounds NEURO: alert & oriented x 3 with fluent speech, no focal motor/sensory deficits Breast exam:  PAC without erythema    CBC    Component Value Date/Time   WBC 4.4 05/26/2023 1112   WBC 5.2 03/18/2023 1820   RBC 4.01 05/26/2023 1112   HGB 11.9 (L) 05/26/2023 1112   HCT 34.7 (L) 05/26/2023 1112   PLT 269 05/26/2023 1112   MCV 86.5 05/26/2023 1112   MCH 29.7 05/26/2023 1112   MCHC 34.3 05/26/2023 1112   RDW 12.7 05/26/2023  1112   LYMPHSABS 1.1 05/26/2023 1112   MONOABS 0.7 05/26/2023 1112   EOSABS 0.1 05/26/2023 1112   BASOSABS 0.1 05/26/2023 1112     CMP     Component Value Date/Time   NA 141 05/26/2023 1112   K 3.6 05/26/2023 1112   CL 108 05/26/2023 1112   CO2 27 05/26/2023 1112   GLUCOSE 73 05/26/2023 1112   BUN 10 05/26/2023 1112   CREATININE 0.71 05/26/2023 1112   CALCIUM 10.0 05/26/2023 1112   PROT 7.4 05/26/2023 1112   ALBUMIN 4.1 05/26/2023 1112   AST 17 05/26/2023 1112   ALT 13 05/26/2023 1112   ALKPHOS 74 05/26/2023 1112   BILITOT 0.3 05/26/2023 1112   GFRNONAA >60 05/26/2023 1112   GFRAA >60 05/14/2020 1725   GFRAA >60 03/22/2020 1441     ASSESSMENT & PLAN:57 year old female   1. Malignant neoplasm of upper-outer quadrant of right breast, invasive lobular carcinoma, stage IA, Snyder(T1a,N1a,M0), ER 10%+, PR-, HER2- Grade I   -Diagnosed in 12/2018. S/Snyder b/l mastectomy on 02/24/19. Pathology showed 1/5 + LNs, margins negative -She completed adjuvant chemo with TC every 3 weeks for 4 cycles.  -She underwent breast reconstruction with implant placements on 06/30/19.  S/Snyder adjuvant radiation with Dr. Roselind Messier 08/17/2019 - 09/28/2019 -She tried antiestrogen therapies with Anastrozole, exemestane, and letrozole from 06/2019 - 03/2020. -no role for mammograms s/Snyder bilateral mastectomy; on surveillance alone  -On surveillance she developed headaches and bone pain, brain MRI 06/02/20 and bone scan 06/06/20 negative.  Dr. Barbaraann Cao is managing vertigo and symptoms have improved   2.  Sarcoidosis -Work-up for chest pain showed enlarging mammary and thoracic lymph nodes, PET scan 03/29/2021 showed multiple hypermetabolic mediastinal, internal mammary, and left axillary lymph nodes, the largest was subcarinal lymph node measuring 1.5 cm.   -She also had a solitary hypermetabolic 8 mm right lower lobe nodule, and indeterminate small foci of hypermetabolic activity in the left thoracic paraspinal soft tissue -Left  axillary lymph node biopsy 04/10/2021 showed granulomatous inflammation consistent with sarcoidosis -Repeat CT chest 09/27/2021 showed stable bilateral pulmonary nodules and decreased size of left internal mammary lymph node -Followed by Dr. Everardo All of pulmonology   3.  Social -Her daughter was on a ventilator since 50 months old, she passed away in Oct 20, 2022at age 32 -Patient's husband with metastatic prostate cancer to the bone, doing well. Transferred care to Dr. Mosetta Putt when Dr. Clelia Croft relocated        PLAN:  No orders of the defined types were placed in this encounter.     All questions were answered. The patient knows to call the clinic with any problems, questions or concerns. No barriers to learning were detected. I spent *** counseling the patient face to  face. The total time spent in the appointment was *** and more than 50% was on counseling, review of test results, and coordination of care.   Santiago Glad, NP-C @DATE @

## 2023-11-24 ENCOUNTER — Inpatient Hospital Stay: Payer: BC Managed Care – PPO | Admitting: Nurse Practitioner

## 2023-11-24 ENCOUNTER — Inpatient Hospital Stay: Payer: BC Managed Care – PPO

## 2023-12-05 ENCOUNTER — Other Ambulatory Visit: Payer: Self-pay | Admitting: Nurse Practitioner

## 2023-12-28 NOTE — Progress Notes (Deleted)
 Patient Care Team: [name] as PCP - General (Physician Assistant) Curvin Deward MOULD, MD as Consulting Physician (General Surgery) Lanny Callander, MD as Consulting Physician (Hematology) Shannon Agent, MD as Consulting Physician (Radiation Oncology) Tyree Nanetta SAILOR, RN as Oncology Nurse Navigator Glean Stephane BROCKS, RN as Oncology Nurse Navigator Tiffany Mcglocklin Snyder, [name] as Nurse Practitioner (Nurse Practitioner)   CHIEF COMPLAINT: Follow up right breast cancer   Oncology History Overview Note  Cancer Staging Malignant neoplasm of upper-outer quadrant of right breast in female, estrogen receptor positive [facility]) Staging form: Breast, AJCC 8th Edition - Clinical stage from 01/15/2019: Stage IA (cT1b, cN0, cM0, G1, ER+, PR-, HER2-) - Signed by Lanny Callander, MD on 01/26/2019 - Pathologic stage from 02/24/2019: Stage IB (pT1a, pN1a, cM0, G1, ER+, PR-, HER2-) - Signed by Lanny Callander, MD on 03/18/2019     Malignant neoplasm of upper-outer quadrant of right breast in female, estrogen receptor positive (HCC)  01/15/2019 Cancer Staging   Staging form: Breast, AJCC 8th Edition - Clinical stage from 01/15/2019: Stage IA (cT1b, cN0, cM0, G1, ER+, PR-, HER2-) - Signed by Lanny Callander, MD on 01/26/2019   01/15/2019 Mammogram   Diagnostic Mammogram 01/15/19  IMPRESSION Targeted ultrasound is performed, showing right breast 10 o'clock 5 cm from the nipple hypoechoic round mass measuring 0.7 x 0.5 x 0.6 cm. Adjacent to it there is a probable complicated cyst measuring 4 mm. There is no evidence of right axillary lymphadenopathy.   01/15/2019 Initial Biopsy   Diagnosis 01/15/19  Breast, right, needle core biopsy, upper outer - INVASIVE AND IN SITU MAMMARY CARCINOMA WITH CALCIFICATIONS. - SEE COMMENT. 1 of 3 FINAL for [name], [name] ([id number]) Microscopic Comment The carcinoma appears grade I. The malignant cells are positive for cytokeratin AE1/AE3 and negative for E-cadherin. A breast prognostic  profile will be performed and the results reported separately. Dr Recardo Likens has reviewed the case and concurs with this interpretation. The results are called to [facility] on 01/18/2019. (JBK:ah:ecj 01/18/19)   01/15/2019 Receptors her2   PROGNOSTIC INDICATORS Results: IMMUNOHISTOCHEMICAL AND MORPHOMETRIC ANALYSIS PERFORMED MANUALLY The tumor cells are NEGATIVE for Her2 (0). Estrogen Receptor: 10%, POSITIVE, MODERATE STAINING INTENSITY Progesterone Receptor: 0%, NEGATIVE Proliferation Marker Ki67: <1%   01/21/2019 Initial Diagnosis   Malignant neoplasm of upper-outer quadrant of right breast in female, estrogen receptor positive (HCC)   01/29/2019 Breast MRI   IMPRESSION: 1. The biopsy-proven site of malignancy in the upper-outer quadrant of the right breast resides centrally with in an area of suspicious non mass enhancement spanning 6-7 cm.   2. There is a 6 mm suspicious enhancing mass in the lower-inner quadrant of the right breast, posterior depth.   3. There is a 9 mm indeterminate enhancing mass in the lower outer quadrant of the right breast.   4.  No evidence of malignancy in the left breast.   02/24/2019 Surgery   RIGHT MASTECTOMY WITH SENTINEL LYMPH NODE MAPPING AND LEFT PROPHYLACTIC MASTECTOMY by Dr. Curvin  BREAST RECONSTRUCTION WITH PLACEMENT OF TISSUE EXPANDER by Dr. Lowery  02/24/19    02/24/2019 Pathology Results   Diagnosis 1. Breast, simple mastectomy, Left - BENIGN BREAST PARENCHYMA WITH FIBROCYSTIC CHANGE. - NEGATIVE FOR CARCINOMA. 2. Breast, simple mastectomy, Right - LOBULAR CARCINOMA IN SITU, INTERMEDIATE TO HIGH NUCLEAR GRADE. SEE NOTE. - NO EVIDENCE OF RESIDUAL INVASIVE CARCINOMA. - RESECTION MARGINS ARE NEGATIVE FOR IN SITU OR INVASIVE CARCINOMA. - BIOPSY SITE CHANGES. - SEE ONCOLOGY TABLE. 3.  Lymph node, sentinel, biopsy, Right #1 - IMMUNOHISTOCHEMICAL STAIN FOR AE1/AE3 IS NEGATIVE FOR CARCINOMA (0/1). 4. Lymph node, sentinel,  biopsy, Right - IMMUNOHISTOCHEMICAL STAIN FOR AE1/AE3 IS NEGATIVE FOR CARCINOMA (0/1). 5. Lymph node, sentinel, biopsy, Right #2 - IMMUNOHISTOCHEMICAL STAIN FOR AE1/AE3 IS NEGATIVE FOR CARCINOMA (0/1). 6. Lymph node, sentinel, biopsy, Right - IMMUNOHISTOCHEMICAL STAIN FOR AE1/AE3 IS NEGATIVE FOR CARCINOMA (0/1). 7. Lymph node, sentinel, biopsy, Right - METASTATIC CARCINOMA TO ONE LYMPH NODE (1/1) (CONFIRMED WITH IMMUNOHISTOCHEMICAL STAIN FOR AE1/AE3) - FOCUS OF METASTATIC CARCINOMA MEASURES JUST OVER 0.2 CM IN GREATEST DIMENSION - NO EVIDENCE OF EXTRANODAL EXTENSION   02/24/2019 Cancer Staging   Staging form: Breast, AJCC 8th Edition - Pathologic stage from 02/24/2019: Stage IB (pT1a, pN1a, cM0, G1, ER+, PR-, HER2-) - Signed by Lanny Callander, MD on 03/18/2019   04/01/2019 - 06/03/2019 Chemotherapy   adjuvant Taxol and Carboplatin every 3 weeks for 4 cycles starting in 2 weeks starting 04/01/19-06/03/19   06/2019 - 03/2020 Anti-estrogen oral therapy   Anastrozole  1mg  daily starting 06/2019, held after 2 weeks due to hot flashes and joint pain and RT. Switched to Exemestane  in 10/2019. Switched to Exemestane  in 10/2019, ultimately changed to letrozole  in 01/2020 due to worsening joint pain. Ultimately stopped anti-estrogen therapies in 03/2020 due to arthralgias    06/30/2019 Surgery   REMOVAL OF BILATERAL TISSUE EXPANDERS WITH PLACEMENT OF BILATERAL BREAST IMPLANTS by Dr Lowery  06/30/19    08/17/2019 - 09/28/2019 Radiation Therapy   Adjuvant Radiaiton with Dr Shannon  Site Technique Total Dose (Gy) Dose per Fx (Gy) Completed Fx Beam Energies  Thorax: CW_Rt_Bst_boost Electron 10/10 2 5/5 6E  Thorax: CW_Rt 3D 50/50 2 25/25 10X, 15X, 6X  Thorax: CW_Rt_axilla Complex 50/50 2 25/25 10X     12/23/2019 Survivorship   SCP delivered by Tiffany Kemple, [name]    06/02/2020 Imaging   MRI brain with and without contrast IMPRESSION: 1. No acute intracranial abnormality or evidence of metastatic disease. 2. Mild  cerebral white matter T2 signal changes, nonspecific though may reflect chronic small vessel ischemic disease, migraines, or prior infection/inflammation.   06/06/2020 Imaging   Bone scan IMPRESSION: No definite scintigraphic evidence of osseous metastases. Mildly abnormal uptake is noted in both knees and feet most consistent with degenerative change.   03/05/2021 Imaging   CT Chest  IMPRESSION: 1. Resolution of ground-glass in the RIGHT upper lobe that was seen on the previous imaging. Compatible with resolution of inflammation. 2. Tiny basilar nodules at the LEFT lung base unchanged since May of 2021 and seen in hind site on the study of May of 2020, largest 3 mm in the LEFT lower lobe. These are likely benign. 3. Slowly enlarging LEFT internal mammary lymph nodes (less than a cm but increasing in size) the with scattered small lymph nodes throughout the chest. Presence of internal mammary lymph nodes is suspicious given patient history. Could consider short interval follow-up in 3 months or PET imaging for further assessment. Smaller internal mammary lymph nodes are present on the RIGHT. 4. 3 mm calculus in the upper pole the RIGHT kidney. 5. Aortic atherosclerosis.   Aortic Atherosclerosis (ICD10-I70.0).   03/29/2021 PET scan   IMPRESSION: 1. There are multiple hypermetabolic mediastinal, internal mammary and left axillary lymph nodes as described. The intensity of the hypermetabolic activity and distribution of the lymph nodes are suspicious for metastatic disease in this patient with a history of breast cancer. 2. Solitary hypermetabolic right lower lobe pulmonary nodule, also suspicious for metastasis.  3. Indeterminate small foci of hypermetabolic activity in the left thoracic paraspinal soft tissues. 4. No evidence of metastatic disease in the abdomen, pelvis or bones. 5. Bilateral nephrolithiasis.   04/10/2021 Imaging   US  Left Axillary  IMPRESSION: Suspicious  left axillary lymph node.   RECOMMENDATION: Ultrasound-guided core needle biopsy suspicious left axillary lymph node.   04/10/2021 Pathology Results   Diagnosis Lymph node, needle/core biopsy, left axillary node - GRANULOMATOUS INFLAMMATION. Microscopic Comment Granulomatous inflammation is epithelioid in nature. The differential diagnosis includes sarcoidosis. Clinical correlation is recommended. Special stains AFB (acid-fast bacillus) and GMS (fungus) will be reported separately. Dr. Towana reviewed the case      CURRENT THERAPY: Surveillance   INTERVAL HISTORY [name] returns for follow up as scheduled. Last seen by me 05/26/23.   ROS   Past Medical History:  Diagnosis Date   Anemia    Anxiety    Arthritis    Cancer (HCC)    right breast   Family history of adverse reaction to anesthesia    sister has same issues   GERD (gastroesophageal reflux disease)    Neuromuscular disorder (HCC)    neuropathy   PONV (postoperative nausea and vomiting)      Past Surgical History:  Procedure Laterality Date   BREAST RECONSTRUCTION WITH PLACEMENT OF TISSUE EXPANDER AND FLEX HD (ACELLULAR HYDRATED DERMIS) Bilateral 02/24/2019   Procedure: BREAST RECONSTRUCTION WITH PLACEMENT OF TISSUE EXPANDER AND FLEX HD (ACELLULAR HYDRATED DERMIS);  Surgeon: Lowery Estefana RAMAN, DO;  Location: [facility] OR;  Service: Plastics;  Laterality: Bilateral;   BREAST SURGERY     left breast bx   COLONOSCOPY     ESOPHAGOGASTRODUODENOSCOPY (EGD) WITH ESOPHAGEAL DILATION     EYE SURGERY     dart to the eye  (right)   FACIAL FRACTURE SURGERY     FRACTURE SURGERY     KNEE SURGERY     MASTECTOMY W/ SENTINEL NODE BIOPSY Bilateral 02/24/2019   Procedure: RIGHT MASTECTOMY WITH SENTINEL LYMPH NODE MAPPING AND LEFT PROPHYLACTIC MASTECTOMY;  Surgeon: Curvin Deward MOULD, MD;  Location: [facility] OR;  Service: General;  Laterality: Bilateral;   POLYPECTOMY     REMOVAL OF BILATERAL TISSUE EXPANDERS WITH PLACEMENT OF BILATERAL  BREAST IMPLANTS Bilateral 06/30/2019   Procedure: REMOVAL OF BILATERAL TISSUE EXPANDERS WITH PLACEMENT OF BILATERAL BREAST IMPLANTS;  Surgeon: Lowery Estefana RAMAN, DO;  Location: [facility];  Service: Plastics;  Laterality: Bilateral;   SHOULDER SURGERY       Outpatient Encounter Medications as of 12/30/2023  Medication Sig   Cholecalciferol (VITAMIN D3) 125 MCG (5000 UT) CAPS Take 5,000 Units by mouth daily. (Patient not taking: Reported on 03/18/2022)   ibuprofen (ADVIL,MOTRIN) 200 MG tablet Take 600 mg by mouth every 6 (six) hours as needed for moderate pain.    Multiple Vitamin (MULTIVITAMIN WITH MINERALS) TABS tablet Take 1 tablet by mouth daily. (Patient not taking: Reported on 03/18/2022)   omeprazole  (PRILOSEC) 20 MG capsule Take 20 mg by mouth daily.   venlafaxine  XR (EFFEXOR -XR) 75 MG 24 hr capsule TAKE 1 CAPSULE BY MOUTH DAILY WITH BREAKFAST.   zolpidem  (AMBIEN ) 5 MG tablet TAKE 1 TABLET BY MOUTH EVERY DAY AT BEDTIME AS NEEDED FOR SLEEP   No facility-administered encounter medications on file as of 12/30/2023.     There were no vitals filed for this visit. There is no height or weight on file to calculate BMI.   PHYSICAL EXAM GENERAL:alert, no distress and comfortable SKIN: no rash  EYES:  sclera clear NECK: without mass LYMPH:  no palpable cervical or supraclavicular lymphadenopathy  LUNGS: clear with normal breathing effort HEART: regular rate & rhythm, no lower extremity edema ABDOMEN: abdomen soft, non-tender and normal bowel sounds NEURO: alert & oriented x 3 with fluent speech, no focal motor/sensory deficits Breast exam:  PAC without erythema    CBC    Component Value Date/Time   WBC 4.4 05/26/2023 1112   WBC 5.2 03/18/2023 1820   RBC 4.01 05/26/2023 1112   HGB 11.9 (L) 05/26/2023 1112   HCT 34.7 (L) 05/26/2023 1112   PLT 269 05/26/2023 1112   MCV 86.5 05/26/2023 1112   MCH 29.7 05/26/2023 1112   MCHC 34.3 05/26/2023 1112   RDW 12.7 05/26/2023  1112   LYMPHSABS 1.1 05/26/2023 1112   MONOABS 0.7 05/26/2023 1112   EOSABS 0.1 05/26/2023 1112   BASOSABS 0.1 05/26/2023 1112     CMP     Component Value Date/Time   NA 141 05/26/2023 1112   Snyder 3.6 05/26/2023 1112   CL 108 05/26/2023 1112   CO2 27 05/26/2023 1112   GLUCOSE 73 05/26/2023 1112   BUN 10 05/26/2023 1112   CREATININE 0.71 05/26/2023 1112   CALCIUM 10.0 05/26/2023 1112   PROT 7.4 05/26/2023 1112   ALBUMIN 4.1 05/26/2023 1112   AST 17 05/26/2023 1112   ALT 13 05/26/2023 1112   ALKPHOS 74 05/26/2023 1112   BILITOT 0.3 05/26/2023 1112   GFRNONAA >60 05/26/2023 1112   GFRAA >60 05/14/2020 1725   GFRAA >60 03/22/2020 1441     ASSESSMENT & PLAN:58 year old female   1. Malignant neoplasm of upper-outer quadrant of right breast, invasive lobular carcinoma, stage IA, Snyder(T1a,N1a,M0), ER 10%+, PR-, HER2- Grade I   -Diagnosed in 12/2018. S/Snyder b/l mastectomy on 02/24/19. Pathology showed 1/5 + LNs, margins negative -She completed adjuvant chemo with TC every 3 weeks for 4 cycles.  -She underwent breast reconstruction with implant placements on 06/30/19.  S/Snyder adjuvant radiation with Dr. Shannon 08/17/2019 - 09/28/2019 -She tried antiestrogen therapies with Anastrozole , exemestane , and letrozole  from 06/2019 - 03/2020. -no role for mammograms s/Snyder bilateral mastectomy; on surveillance alone  -On surveillance she developed headaches and bone pain, brain MRI 06/02/20 and bone scan 06/06/20 negative.  Dr. Buckley is managing vertigo and symptoms have improved    2.  Sarcoidosis -Work-up for chest pain showed enlarging mammary and thoracic lymph nodes, PET scan 03/29/2021 showed multiple hypermetabolic mediastinal, internal mammary, and left axillary lymph nodes, the largest was subcarinal lymph node measuring 1.5 cm.   -She also had a solitary hypermetabolic 8 mm right lower lobe nodule, and indeterminate small foci of hypermetabolic activity in the left thoracic paraspinal soft tissue -Left  axillary lymph node biopsy 04/10/2021 showed granulomatous inflammation consistent with sarcoidosis -Repeat CT chest 09/27/2021 showed stable bilateral pulmonary nodules and decreased size of left internal mammary lymph node -Followed by Dr. Kassie of pulmonology   3.  Social -Her daughter was on a ventilator since 72 months old, she passed away in 20-Oct-2022at age 24 -Patient's husband with metastatic prostate cancer to the bone, doing well. Transferred care to Dr. Lanny when Dr. Amadeo relocated      PLAN:  No orders of the defined types were placed in this encounter.     All questions were answered. The patient knows to call the clinic with any problems, questions or concerns. No barriers to learning were detected. I spent *** counseling the patient face to face.  The total time spent in the appointment was *** and more than 50% was on counseling, review of test results, and coordination of care.   Tiffany Hlad, [name]-C @DATE @

## 2023-12-30 ENCOUNTER — Inpatient Hospital Stay: Payer: BC Managed Care – PPO | Attending: Physician Assistant

## 2023-12-30 ENCOUNTER — Telehealth: Payer: Self-pay | Admitting: *Deleted

## 2023-12-30 ENCOUNTER — Inpatient Hospital Stay: Payer: BC Managed Care – PPO | Admitting: Nurse Practitioner

## 2023-12-30 ENCOUNTER — Telehealth: Payer: Self-pay | Admitting: Nurse Practitioner

## 2023-12-30 NOTE — Telephone Encounter (Signed)
 Left patient a voicemail in regards to rescheduling appointment; left patient callback when ready to reschedule appointment

## 2023-12-30 NOTE — Telephone Encounter (Signed)
 Contacted patient- LVM on named MB: Appts for lab and NP missed today.Patient may contact office to r/s. CC numbers provided. Message will also be sent to CC Scheduler to contact patient  Will send message to scheduling to contact patient to reschedule.

## 2024-03-26 ENCOUNTER — Other Ambulatory Visit: Payer: Self-pay | Admitting: Hematology

## 2024-06-08 ENCOUNTER — Telehealth: Payer: Self-pay | Admitting: Nurse Practitioner

## 2024-06-08 ENCOUNTER — Other Ambulatory Visit: Payer: Self-pay | Admitting: Nurse Practitioner

## 2024-06-08 NOTE — Telephone Encounter (Signed)
 Pt needs a f/u appt sent message to Lesley Gander to contact pt to schedule an appt.

## 2024-06-08 NOTE — Telephone Encounter (Signed)
 Scheduled appointment per rx request. Talked with the patient and she is aware of the made appointment.

## 2024-06-11 DIAGNOSIS — Z6832 Body mass index (BMI) 32.0-32.9, adult: Secondary | ICD-10-CM | POA: Diagnosis not present

## 2024-06-11 DIAGNOSIS — Z124 Encounter for screening for malignant neoplasm of cervix: Secondary | ICD-10-CM | POA: Diagnosis not present

## 2024-06-11 DIAGNOSIS — Z01419 Encounter for gynecological examination (general) (routine) without abnormal findings: Secondary | ICD-10-CM | POA: Diagnosis not present

## 2024-06-11 DIAGNOSIS — N952 Postmenopausal atrophic vaginitis: Secondary | ICD-10-CM | POA: Diagnosis not present

## 2024-06-11 DIAGNOSIS — N951 Menopausal and female climacteric states: Secondary | ICD-10-CM | POA: Diagnosis not present

## 2024-06-14 ENCOUNTER — Inpatient Hospital Stay: Payer: Self-pay

## 2024-06-14 ENCOUNTER — Inpatient Hospital Stay: Payer: Self-pay | Attending: Nurse Practitioner | Admitting: Nurse Practitioner

## 2024-06-14 ENCOUNTER — Encounter: Payer: Self-pay | Admitting: Nurse Practitioner

## 2024-06-14 VITALS — BP 120/70 | HR 80 | Temp 97.8°F | Resp 17 | Wt 176.9 lb

## 2024-06-14 DIAGNOSIS — C50411 Malignant neoplasm of upper-outer quadrant of right female breast: Secondary | ICD-10-CM | POA: Diagnosis not present

## 2024-06-14 DIAGNOSIS — Z79899 Other long term (current) drug therapy: Secondary | ICD-10-CM | POA: Insufficient documentation

## 2024-06-14 DIAGNOSIS — K219 Gastro-esophageal reflux disease without esophagitis: Secondary | ICD-10-CM | POA: Diagnosis not present

## 2024-06-14 DIAGNOSIS — N200 Calculus of kidney: Secondary | ICD-10-CM | POA: Insufficient documentation

## 2024-06-14 DIAGNOSIS — Z9013 Acquired absence of bilateral breasts and nipples: Secondary | ICD-10-CM | POA: Diagnosis not present

## 2024-06-14 DIAGNOSIS — D869 Sarcoidosis, unspecified: Secondary | ICD-10-CM | POA: Diagnosis not present

## 2024-06-14 DIAGNOSIS — Z79811 Long term (current) use of aromatase inhibitors: Secondary | ICD-10-CM | POA: Insufficient documentation

## 2024-06-14 DIAGNOSIS — Z170 Estrogen receptor positive status [ER+]: Secondary | ICD-10-CM | POA: Diagnosis not present

## 2024-06-14 DIAGNOSIS — I700 Atherosclerosis of aorta: Secondary | ICD-10-CM | POA: Diagnosis not present

## 2024-06-14 DIAGNOSIS — R918 Other nonspecific abnormal finding of lung field: Secondary | ICD-10-CM | POA: Insufficient documentation

## 2024-06-14 DIAGNOSIS — G629 Polyneuropathy, unspecified: Secondary | ICD-10-CM | POA: Diagnosis not present

## 2024-06-14 DIAGNOSIS — Z923 Personal history of irradiation: Secondary | ICD-10-CM | POA: Insufficient documentation

## 2024-06-14 DIAGNOSIS — M2550 Pain in unspecified joint: Secondary | ICD-10-CM | POA: Insufficient documentation

## 2024-06-14 LAB — CBC WITH DIFFERENTIAL (CANCER CENTER ONLY)
Abs Immature Granulocytes: 0.01 10*3/uL (ref 0.00–0.07)
Basophils Absolute: 0 10*3/uL (ref 0.0–0.1)
Basophils Relative: 1 %
Eosinophils Absolute: 0.1 10*3/uL (ref 0.0–0.5)
Eosinophils Relative: 2 %
HCT: 35.4 % — ABNORMAL LOW (ref 36.0–46.0)
Hemoglobin: 11.7 g/dL — ABNORMAL LOW (ref 12.0–15.0)
Immature Granulocytes: 0 %
Lymphocytes Relative: 25 %
Lymphs Abs: 1.1 10*3/uL (ref 0.7–4.0)
MCH: 28.6 pg (ref 26.0–34.0)
MCHC: 33.1 g/dL (ref 30.0–36.0)
MCV: 86.6 fL (ref 80.0–100.0)
Monocytes Absolute: 0.6 10*3/uL (ref 0.1–1.0)
Monocytes Relative: 14 %
Neutro Abs: 2.5 10*3/uL (ref 1.7–7.7)
Neutrophils Relative %: 58 %
Platelet Count: 280 10*3/uL (ref 150–400)
RBC: 4.09 MIL/uL (ref 3.87–5.11)
RDW: 12.8 % (ref 11.5–15.5)
WBC Count: 4.3 10*3/uL (ref 4.0–10.5)
nRBC: 0 % (ref 0.0–0.2)

## 2024-06-14 LAB — CMP (CANCER CENTER ONLY)
ALT: 14 U/L (ref 0–44)
AST: 19 U/L (ref 15–41)
Albumin: 4.2 g/dL (ref 3.5–5.0)
Alkaline Phosphatase: 75 U/L (ref 38–126)
Anion gap: 7 (ref 5–15)
BUN: 9 mg/dL (ref 6–20)
CO2: 28 mmol/L (ref 22–32)
Calcium: 9.4 mg/dL (ref 8.9–10.3)
Chloride: 107 mmol/L (ref 98–111)
Creatinine: 0.69 mg/dL (ref 0.44–1.00)
GFR, Estimated: >60 mL/min (ref >60)
Glucose, Bld: 88 mg/dL (ref 70–99)
Potassium: 3.3 mmol/L — ABNORMAL LOW (ref 3.5–5.1)
Sodium: 142 mmol/L (ref 135–145)
Total Bilirubin: 0.4 mg/dL (ref 0.0–1.2)
Total Protein: 7.4 g/dL (ref 6.5–8.1)

## 2024-06-14 MED ORDER — VENLAFAXINE HCL ER 75 MG PO CP24: 75.0000 mg | ORAL_CAPSULE | Freq: Every day | ORAL | 1 refills | Status: DC

## 2024-06-14 MED ORDER — ZOLPIDEM TARTRATE 5 MG PO TABS: 5.0000 mg | ORAL_TABLET | Freq: Every evening | ORAL | 3 refills | Status: DC | PRN

## 2024-06-14 NOTE — Progress Notes (Signed)
 [facility]   [phone]) [phone] [phone]    Patient Care Team: [name] as PCP - General (Physician Assistant) Curvin Deward MOULD, MD as Consulting Physician (General Surgery) Lanny Callander, MD as Consulting Physician (Hematology) Shannon Agent, MD as Consulting Physician (Radiation Oncology) Tyree Nanetta SAILOR, RN as Oncology Nurse Navigator Glean, Stephane BROCKS, RN (Inactive) as Oncology Nurse Navigator Jordain Radin K, NP as Nurse Practitioner (Nurse Practitioner)   CHIEF COMPLAINT: Follow up right breast cancer   Oncology History Overview Note  Cancer Staging Malignant neoplasm of upper-outer quadrant of right breast in female, estrogen receptor positive (HCC) Staging form: Breast, AJCC 8th Edition - Clinical stage from 01/15/2019: Stage IA (cT1b, cN0, cM0, G1, ER+, PR-, HER2-) - Signed by Lanny Callander, MD on 01/26/2019 - Pathologic stage from 02/24/2019: Stage IB (pT1a, pN1a, cM0, G1, ER+, PR-, HER2-) - Signed by Lanny Callander, MD on 03/18/2019     Malignant neoplasm of upper-outer quadrant of right breast in female, estrogen receptor positive (HCC)  01/15/2019 Cancer Staging   Staging form: Breast, AJCC 8th Edition - Clinical stage from 01/15/2019: Stage IA (cT1b, cN0, cM0, G1, ER+, PR-, HER2-) - Signed by Lanny Callander, MD on 01/26/2019   01/15/2019 Mammogram   Diagnostic Mammogram 01/15/19  IMPRESSION Targeted ultrasound is performed, showing right breast 10 o'clock 5 cm from the nipple hypoechoic round mass measuring 0.7 x 0.5 x 0.6 cm. Adjacent to it there is a probable complicated cyst measuring 4 mm. There is no evidence of right axillary lymphadenopathy.   01/15/2019 Initial Biopsy   Diagnosis 01/15/19  Breast, right, needle core biopsy, upper outer - INVASIVE AND IN SITU MAMMARY CARCINOMA WITH CALCIFICATIONS. - SEE COMMENT. 1 of 3 FINAL for Markert, SANDIE P ([id number]) Microscopic Comment The carcinoma appears grade I. The malignant cells  are positive for cytokeratin AE1/AE3 and negative for E-cadherin. A breast prognostic profile will be performed and the results reported separately. Dr Recardo Likens has reviewed the case and concurs with this interpretation. The results are called to [facility] on 01/18/2019. (JBK:ah:ecj 01/18/19)   01/15/2019 Receptors her2   PROGNOSTIC INDICATORS Results: IMMUNOHISTOCHEMICAL AND MORPHOMETRIC ANALYSIS PERFORMED MANUALLY The tumor cells are NEGATIVE for Her2 (0). Estrogen Receptor: 10%, POSITIVE, MODERATE STAINING INTENSITY Progesterone Receptor: 0%, NEGATIVE Proliferation Marker Ki67: <1%   01/21/2019 Initial Diagnosis   Malignant neoplasm of upper-outer quadrant of right breast in female, estrogen receptor positive (HCC)   01/29/2019 Breast MRI   IMPRESSION: 1. The biopsy-proven site of malignancy in the upper-outer quadrant of the right breast resides centrally with in an area of suspicious non mass enhancement spanning 6-7 cm.   2. There is a 6 mm suspicious enhancing mass in the lower-inner quadrant of the right breast, posterior depth.   3. There is a 9 mm indeterminate enhancing mass in the lower outer quadrant of the right breast.   4.  No evidence of malignancy in the left breast.   02/24/2019 Surgery   RIGHT MASTECTOMY WITH SENTINEL LYMPH NODE MAPPING AND LEFT PROPHYLACTIC MASTECTOMY by Dr. Curvin  BREAST RECONSTRUCTION WITH PLACEMENT OF TISSUE EXPANDER by Dr. Lowery  02/24/19    02/24/2019 Pathology Results   Diagnosis 1. Breast, simple mastectomy, Left - BENIGN BREAST PARENCHYMA WITH FIBROCYSTIC CHANGE. - NEGATIVE FOR CARCINOMA. 2. Breast, simple mastectomy, Right - LOBULAR CARCINOMA IN SITU, INTERMEDIATE TO HIGH NUCLEAR GRADE. SEE NOTE. - NO EVIDENCE OF RESIDUAL INVASIVE CARCINOMA. - RESECTION MARGINS ARE  NEGATIVE FOR IN SITU OR INVASIVE CARCINOMA. - BIOPSY SITE CHANGES. - SEE ONCOLOGY TABLE. 3. Lymph node, sentinel, biopsy, Right #1 -  IMMUNOHISTOCHEMICAL STAIN FOR AE1/AE3 IS NEGATIVE FOR CARCINOMA (0/1). 4. Lymph node, sentinel, biopsy, Right - IMMUNOHISTOCHEMICAL STAIN FOR AE1/AE3 IS NEGATIVE FOR CARCINOMA (0/1). 5. Lymph node, sentinel, biopsy, Right #2 - IMMUNOHISTOCHEMICAL STAIN FOR AE1/AE3 IS NEGATIVE FOR CARCINOMA (0/1). 6. Lymph node, sentinel, biopsy, Right - IMMUNOHISTOCHEMICAL STAIN FOR AE1/AE3 IS NEGATIVE FOR CARCINOMA (0/1). 7. Lymph node, sentinel, biopsy, Right - METASTATIC CARCINOMA TO ONE LYMPH NODE (1/1) (CONFIRMED WITH IMMUNOHISTOCHEMICAL STAIN FOR AE1/AE3) - FOCUS OF METASTATIC CARCINOMA MEASURES JUST OVER 0.2 CM IN GREATEST DIMENSION - NO EVIDENCE OF EXTRANODAL EXTENSION   02/24/2019 Cancer Staging   Staging form: Breast, AJCC 8th Edition - Pathologic stage from 02/24/2019: Stage IB (pT1a, pN1a, cM0, G1, ER+, PR-, HER2-) - Signed by Lanny Callander, MD on 03/18/2019   04/01/2019 - 06/03/2019 Chemotherapy   adjuvant Taxol and Carboplatin every 3 weeks for 4 cycles starting in 2 weeks starting 04/01/19-06/03/19   06/2019 - 03/2020 Anti-estrogen oral therapy   Anastrozole  1mg  daily starting 06/2019, held after 2 weeks due to hot flashes and joint pain and RT. Switched to Exemestane  in 10/2019. Switched to Exemestane  in 10/2019, ultimately changed to letrozole  in 01/2020 due to worsening joint pain. Ultimately stopped anti-estrogen therapies in 03/2020 due to arthralgias    06/30/2019 Surgery   REMOVAL OF BILATERAL TISSUE EXPANDERS WITH PLACEMENT OF BILATERAL BREAST IMPLANTS by Dr Lowery  06/30/19    08/17/2019 - 09/28/2019 Radiation Therapy   Adjuvant Radiaiton with Dr Shannon  Site Technique Total Dose (Gy) Dose per Fx (Gy) Completed Fx Beam Energies  Thorax: CW_Rt_Bst_boost Electron 10/10 2 5/5 6E  Thorax: CW_Rt 3D 50/50 2 25/25 10X, 15X, 6X  Thorax: CW_Rt_axilla Complex 50/50 2 25/25 10X     12/23/2019 Survivorship   SCP delivered by Rance Smithson, NP    06/02/2020 Imaging   MRI brain with and without  contrast IMPRESSION: 1. No acute intracranial abnormality or evidence of metastatic disease. 2. Mild cerebral white matter T2 signal changes, nonspecific though may reflect chronic small vessel ischemic disease, migraines, or prior infection/inflammation.   06/06/2020 Imaging   Bone scan IMPRESSION: No definite scintigraphic evidence of osseous metastases. Mildly abnormal uptake is noted in both knees and feet most consistent with degenerative change.   03/05/2021 Imaging   CT Chest  IMPRESSION: 1. Resolution of ground-glass in the RIGHT upper lobe that was seen on the previous imaging. Compatible with resolution of inflammation. 2. Tiny basilar nodules at the LEFT lung base unchanged since May of 2021 and seen in hind site on the study of May of 2020, largest 3 mm in the LEFT lower lobe. These are likely benign. 3. Slowly enlarging LEFT internal mammary lymph nodes (less than a cm but increasing in size) the with scattered small lymph nodes throughout the chest. Presence of internal mammary lymph nodes is suspicious given patient history. Could consider short interval follow-up in 3 months or PET imaging for further assessment. Smaller internal mammary lymph nodes are present on the RIGHT. 4. 3 mm calculus in the upper pole the RIGHT kidney. 5. Aortic atherosclerosis.   Aortic Atherosclerosis (ICD10-I70.0).   03/29/2021 PET scan   IMPRESSION: 1. There are multiple hypermetabolic mediastinal, internal mammary and left axillary lymph nodes as described. The intensity of the hypermetabolic activity and distribution of the lymph nodes are suspicious for metastatic disease in this patient with a  history of breast cancer. 2. Solitary hypermetabolic right lower lobe pulmonary nodule, also suspicious for metastasis. 3. Indeterminate small foci of hypermetabolic activity in the left thoracic paraspinal soft tissues. 4. No evidence of metastatic disease in the abdomen, pelvis  or bones. 5. Bilateral nephrolithiasis.   04/10/2021 Imaging   US  Left Axillary  IMPRESSION: Suspicious left axillary lymph node.   RECOMMENDATION: Ultrasound-guided core needle biopsy suspicious left axillary lymph node.   04/10/2021 Pathology Results   Diagnosis Lymph node, needle/core biopsy, left axillary node - GRANULOMATOUS INFLAMMATION. Microscopic Comment Granulomatous inflammation is epithelioid in nature. The differential diagnosis includes sarcoidosis. Clinical correlation is recommended. Special stains AFB (acid-fast bacillus) and GMS (fungus) will be reported separately. Dr. Towana reviewed the case      CURRENT THERAPY: Surveillance   INTERVAL HISTORY Ms. Schlatter returns for follow up as scheduled, last seen by me 05/26/23.  Her husband remains in treatment for metastatic prostate cancer and she has other life stressors but is overall doing well with physical and mental health.  Still has occasional right axillary catch if she reaches all the way over her chest.  Denies new lump/mass, extreme fatigue, unintentional weight loss, or bone or joint pain  ROS  All other systems reviewed and negative  Past Medical History:  Diagnosis Date   Anemia    Anxiety    Arthritis    Cancer (HCC)    right breast   Family history of adverse reaction to anesthesia    sister has same issues   GERD (gastroesophageal reflux disease)    Neuromuscular disorder (HCC)    neuropathy   PONV (postoperative nausea and vomiting)      Past Surgical History:  Procedure Laterality Date   BREAST RECONSTRUCTION WITH PLACEMENT OF TISSUE EXPANDER AND FLEX HD (ACELLULAR HYDRATED DERMIS) Bilateral 02/24/2019   Procedure: BREAST RECONSTRUCTION WITH PLACEMENT OF TISSUE EXPANDER AND FLEX HD (ACELLULAR HYDRATED DERMIS);  Surgeon: Lowery Estefana RAMAN, DO;  Location: [facility] OR;  Service: Plastics;  Laterality: Bilateral;   BREAST SURGERY     left breast bx   COLONOSCOPY      ESOPHAGOGASTRODUODENOSCOPY (EGD) WITH ESOPHAGEAL DILATION     EYE SURGERY     dart to the eye  (right)   FACIAL FRACTURE SURGERY     FRACTURE SURGERY     KNEE SURGERY     MASTECTOMY W/ SENTINEL NODE BIOPSY Bilateral 02/24/2019   Procedure: RIGHT MASTECTOMY WITH SENTINEL LYMPH NODE MAPPING AND LEFT PROPHYLACTIC MASTECTOMY;  Surgeon: Curvin Deward MOULD, MD;  Location: [facility] OR;  Service: General;  Laterality: Bilateral;   POLYPECTOMY     REMOVAL OF BILATERAL TISSUE EXPANDERS WITH PLACEMENT OF BILATERAL BREAST IMPLANTS Bilateral 06/30/2019   Procedure: REMOVAL OF BILATERAL TISSUE EXPANDERS WITH PLACEMENT OF BILATERAL BREAST IMPLANTS;  Surgeon: Lowery Estefana RAMAN, DO;  Location: [facility];  Service: Plastics;  Laterality: Bilateral;   SHOULDER SURGERY       Outpatient Encounter Medications as of 06/14/2024  Medication Sig   Cholecalciferol (VITAMIN D3) 125 MCG (5000 UT) CAPS Take 5,000 Units by mouth daily. (Patient not taking: Reported on 03/18/2022)   ibuprofen (ADVIL,MOTRIN) 200 MG tablet Take 600 mg by mouth every 6 (six) hours as needed for moderate pain.    Multiple Vitamin (MULTIVITAMIN WITH MINERALS) TABS tablet Take 1 tablet by mouth daily. (Patient not taking: Reported on 03/18/2022)   omeprazole  (PRILOSEC) 20 MG capsule Take 20 mg by mouth daily.   venlafaxine  XR (EFFEXOR -XR) 75 MG 24 hr  capsule TAKE 1 CAPSULE BY MOUTH DAILY WITH BREAKFAST.   zolpidem  (AMBIEN ) 5 MG tablet TAKE 1 TABLET BY MOUTH AT BEDTIME AS NEEDED FOR SLEEP   No facility-administered encounter medications on file as of 06/14/2024.     Today's Vitals   06/14/24 1126  BP: 120/70  Pulse: 80  Resp: 17  Temp: 97.8 F (36.6 C)  SpO2: 99%  Weight: 176 lb 14.4 oz (80.2 kg)   Body mass index is 31.34 kg/m.   ECOG PERFORMANCE STATUS: 0 - Asymptomatic  PHYSICAL EXAM GENERAL:alert, no distress and comfortable SKIN: no rash  EYES: sclera clear NECK: without mass LYMPH:  no palpable cervical or  supraclavicular lymphadenopathy  LUNGS: clear with normal breathing effort HEART: regular rate & rhythm, no lower extremity edema ABDOMEN: abdomen soft, non-tender and normal bowel sounds NEURO: alert & oriented x 3 with fluent speech, no focal motor/sensory deficits Breast exam: S/p bilateral mastectomy and reconstruction, incisions completely healed with mild scar tissue.  No palpable mass or nodularity in either breast or axilla that I could appreciate   CBC    Latest Ref Rng & Units 06/14/2024   12:04 PM 05/26/2023   11:12 AM 03/18/2023    6:20 PM  CBC  WBC 4.0 - 10.5 K/uL 4.3  4.4  5.2   Hemoglobin 12.0 - 15.0 g/dL 88.2  88.0  88.1   Hematocrit 36.0 - 46.0 % 35.4  34.7  35.6   Platelets 150 - 400 K/uL 280  269  309       CMP     Latest Ref Rng & Units 06/14/2024   12:04 PM 05/26/2023   11:12 AM 03/18/2023    6:20 PM  CMP  Glucose 70 - 99 mg/dL 88  73  897   BUN 6 - 20 mg/dL 9  10  8    Creatinine 0.44 - 1.00 mg/dL 9.30  9.28  9.40   Sodium 135 - 145 mmol/L 142  141  136   Potassium 3.5 - 5.1 mmol/L 3.3  3.6  3.4   Chloride 98 - 111 mmol/L 107  108  105   CO2 22 - 32 mmol/L 28  27  25    Calcium 8.9 - 10.3 mg/dL 9.4  89.9  9.3   Total Protein 6.5 - 8.1 g/dL 7.4  7.4    Total Bilirubin 0.0 - 1.2 mg/dL 0.4  0.3    Alkaline Phos 38 - 126 U/L 75  74    AST 15 - 41 U/L 19  17    ALT 0 - 44 U/L 14  13        ASSESSMENT & PLAN:58 year old female   1. Malignant neoplasm of upper-outer quadrant of right breast, invasive lobular carcinoma, stage IA, p(T1a,N1a,M0), ER 10%+, PR-, HER2- Grade I   -Diagnosed in 12/2018. S/p b/l mastectomy on 02/24/19. Pathology showed 1/5 + LNs, margins negative -She completed adjuvant chemo with TC every 3 weeks for 4 cycles.  -She underwent breast reconstruction with implant placements on 06/30/19.  S/p adjuvant radiation with Dr. Shannon 08/17/2019 - 09/28/2019 -She tried antiestrogen therapies with Anastrozole , exemestane , and letrozole  from 06/2019 -  03/2020. -no role for mammograms s/p bilateral mastectomy; on surveillance alone  -On surveillance she developed headaches and bone pain, brain MRI 06/02/20 and bone scan 06/06/20 negative.  Dr. Buckley is managing vertigo and symptoms have improved - Ms. Clowdus is clinically doing well, exam is benign, labs are stable.  Overall no clinical concern for recurrence.  She has completed 5 years of surveillance but prefers to continue annual follow-up in our clinic - She knows to call sooner if needed   2.  Sarcoidosis -Work-up for chest pain showed enlarging mammary and thoracic lymph nodes, PET scan 03/29/2021 showed multiple hypermetabolic mediastinal, internal mammary, and left axillary lymph nodes, the largest was subcarinal lymph node measuring 1.5 cm.   -She also had a solitary hypermetabolic 8 mm right lower lobe nodule, and indeterminate small foci of hypermetabolic activity in the left thoracic paraspinal soft tissue -Left axillary lymph node biopsy 04/10/2021 showed granulomatous inflammation consistent with sarcoidosis -Repeat CT chest 09/27/2021 showed stable bilateral pulmonary nodules and decreased size of left internal mammary lymph node -Followed by Dr. Kassie of pulmonology   3.  Social -Her daughter was on a ventilator since 27 months old, she passed away in 10/21/22at age 76 -Patient's husband with metastatic prostate cancer to the bone, doing well. Transferred care to Dr. Lanny when Dr. Amadeo relocated    PLAN: Labs reviewed Continue breast cancer surveillance Follow-up with me in 1 year, or sooner if needed Referred to new PCP locally to establish care per patient's request Refilled Effexor  and Ambien  with goals to wean in the future as able  Orders Placed This Encounter  Procedures   CBC with Differential (Cancer Center Only)    Standing Status:   Future    Number of Occurrences:   1    Expiration Date:   06/14/2025   CMP (Cancer Center only)    Standing Status:    Future    Number of Occurrences:   1    Expiration Date:   06/14/2025   CA 27.29    Standing Status:   Future    Number of Occurrences:   1    Expiration Date:   06/14/2025   Ambulatory referral to Internal Medicine    Referral Priority:   Routine    Referral Type:   Consultation    Referral Reason:   Specialty Services Required    Referred to Provider:   Geofm Glade PARAS, MD    Requested Specialty:   Internal Medicine    Number of Visits Requested:   1      All questions were answered. The patient knows to call the clinic with any problems, questions or concerns. No barriers to learning were detected.  Ladainian Therien K Kehinde Totzke, NP 06/14/2024

## 2024-06-15 ENCOUNTER — Ambulatory Visit: Payer: Self-pay | Admitting: Nurse Practitioner

## 2024-06-15 LAB — CANCER ANTIGEN 27.29: CA 27.29: 34.1 U/mL (ref 0.0–38.6)

## 2024-11-19 ENCOUNTER — Other Ambulatory Visit: Payer: Self-pay | Admitting: Nurse Practitioner

## 2024-11-19 DIAGNOSIS — Z170 Estrogen receptor positive status [ER+]: Secondary | ICD-10-CM

## 2024-12-22 ENCOUNTER — Encounter (HOSPITAL_BASED_OUTPATIENT_CLINIC_OR_DEPARTMENT_OTHER): Payer: Self-pay

## 2024-12-22 ENCOUNTER — Emergency Department (HOSPITAL_BASED_OUTPATIENT_CLINIC_OR_DEPARTMENT_OTHER)
Admission: EM | Admit: 2024-12-22 | Discharge: 2024-12-22 | Disposition: A | Discharge status: Home / Self Care | Attending: Emergency Medicine | Admitting: Emergency Medicine

## 2024-12-22 ENCOUNTER — Emergency Department (HOSPITAL_BASED_OUTPATIENT_CLINIC_OR_DEPARTMENT_OTHER)

## 2024-12-22 ENCOUNTER — Other Ambulatory Visit: Payer: Self-pay

## 2024-12-22 DIAGNOSIS — R059 Cough, unspecified: Secondary | ICD-10-CM | POA: Insufficient documentation

## 2024-12-22 DIAGNOSIS — M79622 Pain in left upper arm: Secondary | ICD-10-CM | POA: Insufficient documentation

## 2024-12-22 DIAGNOSIS — R110 Nausea: Secondary | ICD-10-CM | POA: Insufficient documentation

## 2024-12-22 DIAGNOSIS — I10 Essential (primary) hypertension: Secondary | ICD-10-CM | POA: Diagnosis not present

## 2024-12-22 DIAGNOSIS — Z853 Personal history of malignant neoplasm of breast: Secondary | ICD-10-CM | POA: Insufficient documentation

## 2024-12-22 DIAGNOSIS — Z79899 Other long term (current) drug therapy: Secondary | ICD-10-CM | POA: Diagnosis not present

## 2024-12-22 DIAGNOSIS — R0789 Other chest pain: Secondary | ICD-10-CM | POA: Insufficient documentation

## 2024-12-22 LAB — CBC
HCT: 36.6 % (ref 36.0–46.0)
Hemoglobin: 12 g/dL (ref 12.0–15.0)
MCH: 28 pg (ref 26.0–34.0)
MCHC: 32.8 g/dL (ref 30.0–36.0)
MCV: 85.3 fL (ref 80.0–100.0)
Platelets: 306 K/uL (ref 150–400)
RBC: 4.29 MIL/uL (ref 3.87–5.11)
RDW: 12.9 % (ref 11.5–15.5)
WBC: 3.6 K/uL — ABNORMAL LOW (ref 4.0–10.5)
nRBC: 0 % (ref 0.0–0.2)

## 2024-12-22 LAB — TROPONIN T, HIGH SENSITIVITY: Troponin T High Sensitivity: <15 ng/L (ref 0–19)

## 2024-12-22 LAB — BASIC METABOLIC PANEL WITH GFR
Anion gap: 11 (ref 5–15)
BUN: 8 mg/dL (ref 6–20)
CO2: 26 mmol/L (ref 22–32)
Calcium: 9.8 mg/dL (ref 8.9–10.3)
Chloride: 103 mmol/L (ref 98–111)
Creatinine, Ser: 0.63 mg/dL (ref 0.44–1.00)
GFR, Estimated: >60 mL/min
Glucose, Bld: 100 mg/dL — ABNORMAL HIGH (ref 70–99)
Potassium: 3.7 mmol/L (ref 3.5–5.1)
Sodium: 139 mmol/L (ref 135–145)

## 2024-12-22 LAB — D-DIMER, QUANTITATIVE: D-Dimer, Quant: 0.62 ug{FEU}/mL — ABNORMAL HIGH (ref 0.00–0.50)

## 2024-12-22 MED ORDER — KETOROLAC TROMETHAMINE 15 MG/ML IJ SOLN
15.0000 mg | Freq: Once | INTRAMUSCULAR | Status: AC
  Administered 2024-12-22: 15 mg via INTRAVENOUS
  Filled 2024-12-22: qty 1

## 2024-12-22 MED ORDER — LIDOCAINE 5 % EX PTCH: 1.0000 | MEDICATED_PATCH | CUTANEOUS | 0 refills | Status: DC

## 2024-12-22 MED ORDER — NAPROXEN 500 MG PO TABS: 500.0000 mg | ORAL_TABLET | Freq: Two times a day (BID) | ORAL | 0 refills | Status: AC

## 2024-12-22 MED ORDER — IOHEXOL 350 MG/ML SOLN
75.0000 mL | Freq: Once | INTRAVENOUS | Status: AC | PRN
  Administered 2024-12-22: 75 mL via INTRAVENOUS

## 2024-12-22 MED ORDER — METHOCARBAMOL 500 MG PO TABS: 500.0000 mg | ORAL_TABLET | Freq: Two times a day (BID) | ORAL | 0 refills | Status: DC

## 2024-12-22 NOTE — ED Notes (Signed)
 IV repositioned and retaped for comfort per pt request.

## 2024-12-22 NOTE — ED Triage Notes (Addendum)
 Pt complaining of left sided chest pain, arm pain since left night. Left arm feels tingly and heavy.Left sided neck tightness. Denies injury Pain began last night prior to bed. Some nausea and hasn't eaten much today  Some nausea

## 2024-12-22 NOTE — ED Notes (Signed)
 Patient transported to X-ray

## 2024-12-22 NOTE — ED Provider Notes (Signed)
 6:49 PM Patient seen in conjunction with Lal PA-C.  Patient presents to the ED for evaluation of left chest, axillary, upper arm pain.  She reports some numbness down into her hand.  Pain is worse with palpation and movement.  She denies any injuries.  No history of DVT or other clots.  Cardiac workup performed.  CBC was unremarkable, BMP unremarkable; troponin was less than 15.  D-dimer was ordered to screen for thrombosis, this was mildly elevated.  Follow-up CTA, personally reviewed and interpreted by myself.  I agree no pulmonary embolism or other lung abnormality such as pneumonia.  Benign nodules noted.  No reported abnormality of the thoracic aorta.  Radiology reports normal variant right subclavian artery.  On exam, patient has pain with movement of her left arm and shoulder.  She has tenderness to palpation of the upper arm over the biceps, anterior and posterior shoulder as well as the axilla.  Distal arm with normal sensation.  2+ radial pulse, symmetric compared to the right side.  Cap refill <2s all digits of left hand.  No cyanosis or pallor.  No mottling.  No swelling to suggest DVT.  Reproducibility suggests MSK pain.  Agree with workup.  Plan for symptom control and discharged home with close outpatient follow-up.  Patient was anxious about her blood pressure being elevated at 143/106.  Discussed that this is not likely a dangerous reading and partially due to associated discomfort.  Will recheck prior to discharge.  BP 130/79   Pulse 65   Temp 98.8 F (37.1 C) (Oral)   Resp 12   Wt 79.4 kg   SpO2 97%   BMI 31.00 kg/m     Desiderio Chew, PA-C 12/22/24 1854    Doretha Folks, MD 12/25/24 1324

## 2024-12-22 NOTE — ED Provider Notes (Signed)
 " [place] [facility] AT [facility] Provider Note   CSN: [id number] Arrival date & time: 12/22/24  1617     Patient presents with: Chest Pain   [name] is a 59 y.o. female.  presents to the ED for left-sided chest pain that started last night.  She notes that the pain has been constant since last night.  Patient reports the chest pain radiates to left arm.  She denies any falls or injuries.  Patient states she was getting ready for bed when the pain started.  Not worse with movement or rest. She states it feels like something is on her chest.  Patient has not taken any medication for symptoms.  She does note difficulty taking deep breaths.  Patient also notes mild cough.  History of breast cancer with mastectomy in 2020.  Patient states that she was in a 12-hour car ride 2 weeks ago to [place] .  Denies leg swelling.  No sick contacts.  Had nausea earlier and could not eat.  Husband notes that the patient has been under a lot of stress.  Denies heart problems.  Patient does report family history of MI.  Denies history of blood clots.  Denies blood thinner use.  History of GERD.  No recent URI.  Denies fevers, chills, shortness of breath, leg swelling, weakness, numbness or tingling, headache, hemoptysis, urinary symptoms, vomiting, diarrhea, diaphoresis, dizziness, or any other symptoms at this time.       Chest Pain Associated symptoms: cough        Prior to Admission medications  Medication Sig Start Date End Date Taking? Authorizing Provider  lidocaine  (LIDODERM ) 5 % Place 1 patch onto the skin daily. Remove & Discard patch within 12 hours or as directed by MD 12/22/24  Yes Braxton, Dickey Caamano, PA-C  methocarbamol  (ROBAXIN ) 500 MG tablet Take 1 tablet (500 mg total) by mouth 2 (two) times daily. 12/22/24  Yes Zane Samson, PA-C  naproxen  (NAPROSYN ) 500 MG tablet Take 1 tablet (500 mg total) by mouth 2 (two) times daily for 7 days. 12/22/24 12/29/24 Yes Davidjames Blansett, PA-C   Cholecalciferol (VITAMIN D3) 125 MCG (5000 UT) CAPS Take 5,000 Units by mouth daily. Patient not taking: Reported on 03/18/2022    [provider]  ibuprofen (ADVIL,MOTRIN) 200 MG tablet Take 600 mg by mouth every 6 (six) hours as needed for moderate pain.     [provider]  Multiple Vitamin (MULTIVITAMIN WITH MINERALS) TABS tablet Take 1 tablet by mouth daily. Patient not taking: Reported on 03/18/2022    [provider]  omeprazole (PRILOSEC) 20 MG capsule Take 20 mg by mouth daily.    [provider]  venlafaxine  XR (EFFEXOR -XR) 75 MG 24 hr capsule Take 1 capsule (75 mg total) by mouth daily with breakfast. 06/14/24   Burton, Lacie K, NP  zolpidem  (AMBIEN ) 5 MG tablet TAKE 1 TABLET (5 MG TOTAL) BY MOUTH EVERY DAY AT BEDTIME AS NEEDED FOR SLEEP 11/22/24   Hanford Powell BRAVO, NP    Allergies: Codeine and Tramadol    Review of Systems  Respiratory:  Positive for cough.   Cardiovascular:  Positive for chest pain.    Updated Vital Signs BP (!) 142/87   Pulse 63   Temp 98.8 F (37.1 C) (Oral)   Resp 12   Wt 79.4 kg   SpO2 95%   BMI 31.00 kg/m   Physical Exam Vitals and nursing note reviewed.  Constitutional:      General:  She is not in acute distress.    Appearance: She is well-developed.  HENT:     Head: Normocephalic and atraumatic.  Eyes:     Conjunctiva/sclera: Conjunctivae normal.  Cardiovascular:     Rate and Rhythm: Normal rate and regular rhythm.     Heart sounds: Normal heart sounds. No murmur heard. Pulmonary:     Effort: Pulmonary effort is normal. No respiratory distress.     Breath sounds: Normal breath sounds. No decreased breath sounds, wheezing, rhonchi or rales.  Chest:     Chest wall: Tenderness present.     Comments: Tenderness to palpation to left sided chest wall. Abdominal:     Palpations: Abdomen is soft.     Tenderness: There is no abdominal tenderness. There is no right CVA tenderness, left CVA tenderness, guarding  or rebound. Negative signs include Murphy's sign.  Musculoskeletal:        General: No swelling.     Right elbow: Normal.     Left elbow: Normal.     Right forearm: Normal.     Left forearm: Normal.     Right wrist: Normal.     Left wrist: Normal.     Cervical back: Neck supple.     Comments: Tenderness to palpation to left upper arm with no erythema or swelling, sensation intact  Skin:    General: Skin is warm and dry.     Capillary Refill: Capillary refill takes less than 2 seconds.  Neurological:     Mental Status: She is alert and oriented to person, place, and time.     Comments: Mental Status:  Alert, oriented, thought content appropriate, able to give a coherent history. Speech fluent without evidence of aphasia. Able to follow 2 step commands without difficulty.  Cranial Nerves:  II:  Peripheral visual fields grossly normal, pupils equal, round, reactive to light III,IV, VI: ptosis not present, extra-ocular motions intact bilaterally  V,VII: smile symmetric, facial light touch sensation equal VIII: hearing grossly normal to voice  X: uvula elevates symmetrically  XI: bilateral shoulder shrug symmetric and strong XII: midline tongue extension without fassiculations Motor:  Normal tone. 5/5 in upper and lower extremities bilaterally including strong and equal grip strength and dorsiflexion/plantar flexion Sensory: Pinprick and light touch normal in all extremities.  Deep Tendon Reflexes: 2+ and symmetric in the biceps and patella Cerebellar: normal finger-to-nose with bilateral upper extremities Gait: normal gait and balance CV: distal pulses palpable throughout    Psychiatric:        Mood and Affect: Mood normal.     (all labs ordered are listed, but only abnormal results are displayed) Labs Reviewed  BASIC METABOLIC PANEL WITH GFR - Abnormal; Notable for the following components:      Result Value   Glucose, Bld 100 (*)    All other components within normal limits   CBC - Abnormal; Notable for the following components:   WBC 3.6 (*)    All other components within normal limits  D-DIMER, QUANTITATIVE - Abnormal; Notable for the following components:   D-Dimer, Quant 0.62 (*)    All other components within normal limits  TROPONIN T, HIGH SENSITIVITY    EKG: EKG Interpretation Date/Time:  Wednesday December 22 2024 16:26:15 EST Ventricular Rate:  77 PR Interval:  150 QRS Duration:  106 QT Interval:  380 QTC Calculation: 430 R Axis:   48  Text Interpretation: Sinus rhythm Low voltage, precordial leads No significant change since last tracing Confirmed by Doretha,  Benton ([id number]) on 12/22/2024 4:39:29 PM  Radiology: CT Angio Chest PE W and/or Wo Contrast Result Date: 12/22/2024 EXAM: CTA of the Chest with contrast for PE 12/22/2024 05:54:20 PM TECHNIQUE: CTA of the chest was performed after the administration of intravenous contrast. Multiplanar reformatted images are provided for review. MIP images are provided for review. Automated exposure control, iterative reconstruction, and/or weight based adjustment of the mA/kV was utilized to reduce the radiation dose to as low as reasonably achievable. COMPARISON: 09/27/2021 CLINICAL HISTORY: Chest wall pain, nontraumatic, malignancy known or suspected, xray done. FINDINGS: PULMONARY ARTERIES: Pulmonary arteries are adequately opacified for evaluation. No pulmonary embolism. Main pulmonary artery is normal in caliber. MEDIASTINUM: The heart and pericardium demonstrate no acute abnormality. There is no acute abnormality of the thoracic aorta. 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 LYMPH NODES: No mediastinal, hilar or axillary lymphadenopathy. LUNGS AND PLEURA: Unchanged 3 mm subpleural left lower lobe nodule (axial 75). Posterior bibasilar dependent atelectasis. 2 mm micronodule in the posterolateral left lower lobe (axial 73), also unchanged. 2 mm lateral right middle lobe micronodule (axial  63), also unchanged. Given the stability, these nodules are benign. No pleural effusion or pneumothorax. UPPER ABDOMEN: Limited images of the upper abdomen are unremarkable. SOFT TISSUES AND BONES: Bilateral retropectoral breast implants. Mild multilevel degenerative disc disease of the visualized spine. No acute bone or soft tissue abnormality. IMPRESSION: 1. No pulmonary embolism. No pneumonia, pulmonary edema, or pleural effusion. 2. Similar appearance of a couple of scattered pulmonary nodules measuring up to 3 mm , as described above. Given the stability, these are all benign. Electronically signed by: Rogelia Myers MD MD 12/22/2024 06:07 PM EST RP Workstation: [id number]   DG Chest 2 View Result Date: 12/22/2024 EXAM: 2 VIEW(S) XRAY OF THE CHEST 12/22/2024 05:12:00 PM COMPARISON: None available. CLINICAL HISTORY: left chest pain FINDINGS: LUNGS AND PLEURA: No focal pulmonary opacity. No pleural effusion. No pneumothorax. HEART AND MEDIASTINUM: No acute abnormality of the cardiac and mediastinal silhouettes. BONES AND SOFT TISSUES: Surgical clips in left axilla. Surgical clips in right axilla. Mild-to-moderate multilevel thoracic spine degenerative disc changes. IMPRESSION: 1. No acute findings. Electronically signed by: Greig Pique MD MD 12/22/2024 05:22 PM EST RP Workstation: [id number]     Procedures   Medications Ordered in the ED  ketorolac  (TORADOL ) 15 MG/ML injection 15 mg (15 mg Intravenous Given 12/22/24 1720)  iohexol  (OMNIPAQUE ) 350 MG/ML injection 75 mL (75 mLs Intravenous Contrast Given 12/22/24 1744)                                    Medical Decision Making Amount and/or Complexity of Data Reviewed Labs: ordered. Radiology: ordered.    This patient presents to the ED for concern of left-sided chest pain that radiates to left arm. this involves an extensive number of treatment options, and is a complaint that carries with it a high risk of complications and morbidity.  The  differential diagnosis includes ACS, pericarditis, myocarditis, aortic dissection, PE, pneumothorax, esophageal rupture, pneumonia, reflux/PUD, biliary disease, pancreatitis, costochondritis, anxiety, pleurisy    Additional history obtained:  Additional history obtained from EMR  Lab Tests:  I Ordered, and personally interpreted labs.  The pertinent results include: Negative BMP, negative CBC, elevated D-dimer at 0.62.  troponin not elevated.   Imaging Studies ordered:  I ordered imaging studies including chest x-ray and CT angio chest  I independently visualized and interpreted imaging which  showed no signs of pneumonia on chest x-ray.  No signs of PE on CT angio chest, scattered pulmonary nodules measuring up to 3 cm which is similar to previous finding. I agree with the radiologist interpretation   Cardiac Monitoring: / EKG:  The patient was maintained on a cardiac monitor.  I personally viewed and interpreted the cardiac monitored which showed an underlying rhythm of: Normal sinus    Dispostion: Patient presents with left-sided chest pain radiating to the left arm that started last night.  She notes it has been constant.  No injuries.  No history of MI or other cardiac problems.  On exam she is not ill-appearing or distress.  There is left-sided chest wall tenderness to palpation on exam.  Tenderness to palpation to left upper arm.  Neurovascularly intact abdomen soft nontender.  Heart regular rate rhythm.  No leg swelling appreciated.  Hypertensive at 143/106 on initial evaluation.  Other vital signs were stable.  Patient does have a history of breast cancer.  She was in a 12-hour car ride 2 weeks ago.  Due to her history, D-dimer was obtained.  D-dimer was mildly elevated at 0.62.  Obtain CT angio chest PE for further evaluation.  Troponin was not elevated.  Negative CBC and BMP.  CT angio did not show any signs of pulmonary embolism.  There were scattered pulmonary nodules measuring  up to 3 mm on CT angio chest.  This is unchanged from previous CT chest that was done in 2022. Chest x-ray without signs of pneumonia.  Based on patient's negative workup and reproducible chest wall pain, I do not believe this is cardiac etiology.  Patient symptoms are more consistent with musculoskeletal chest wall pain.  Reassured the patient and family.  Results were also discussed.  Provided Toradol  which did provide relief.  Blood pressure was improved to 130/90 upon reevaluation.  Will prescribe naproxen  for pain as well as a muscle relaxer as needed.  Will also provide lidocaine  patch.  Return precautions provided.  Recommended patient to follow-up with primary care doctor for further evaluation.  Patient is in agreement with plan and is stable for discharge.         Final diagnoses:  Chest wall pain    ED Discharge Orders          Ordered    naproxen  (NAPROSYN ) 500 MG tablet  2 times daily        12/22/24 1849    lidocaine  (LIDODERM ) 5 %  Every 24 hours        12/22/24 1849    methocarbamol  (ROBAXIN ) 500 MG tablet  2 times daily        12/22/24 1849               Braxton Dubois, PA-C 12/22/24 1900    Doretha Folks, MD 12/25/24 1328  "

## 2024-12-22 NOTE — Discharge Instructions (Addendum)
 Your workup here was negative. Take naproxen  as prescribed for pain.  Apply heat or ice to the affected area for 15 minutes several times daily.  Start taking Robaxin .  Apply lidocaine  patch to the affected area.  Return for worsening shortness of breath, chest pain, high fevers, palpitations. Follow-up with primary care for further evaluation.

## 2024-12-24 ENCOUNTER — Encounter: Payer: Self-pay | Admitting: Emergency Medicine

## 2024-12-24 ENCOUNTER — Ambulatory Visit: Admission: EM | Admit: 2024-12-24 | Discharge: 2024-12-24 | Disposition: A | Discharge status: Home / Self Care

## 2024-12-24 DIAGNOSIS — B029 Zoster without complications: Secondary | ICD-10-CM | POA: Diagnosis not present

## 2024-12-24 MED ORDER — VALACYCLOVIR HCL 1 G PO TABS: 1000.0000 mg | ORAL_TABLET | Freq: Three times a day (TID) | ORAL | 0 refills | Status: AC

## 2024-12-24 NOTE — ED Triage Notes (Signed)
 Pt reports suspected shingles rash to L upper back x2 days. Rash does not cross midline. Reports severe, sharp pain. Seen recently in ED for L-sided back pain and chest pain. Has taken robaxin  and naproxen  with no relief.

## 2024-12-24 NOTE — ED Provider Notes (Signed)
 " [facility]    CSN: [id number] Arrival date & time: 12/24/24  1426      History   Chief Complaint Chief Complaint  Patient presents with   Rash    HPI [name] is a 60 y.o. female.   Pt presents today due to sudden onset of painful eruption of back that husband noticed this am. Pt was in the ED for pain in this area on 1/7 but did not have eruption at that time.   The history is provided by the patient.  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   Chronic depression 02/22/2019   Breast cancer (HCC) 01/21/2019   Iron deficiency anemia 11/23/2018   History of arthroplasty of right knee 06/17/2018   Tear of medial meniscus of knee 05/02/2018   Right knee pain 03/18/2018   Pain in right knee 03/18/2018   Injury of right knee 03/18/2018   Bilateral plantar fasciitis 12/25/2017   Ganglion of wrist 12/25/2017   Elevated LFTs 04/15/2017   GERD (gastroesophageal reflux disease) 05/21/2016    Past Surgical History:  Procedure Laterality Date   BREAST RECONSTRUCTION WITH PLACEMENT OF TISSUE EXPANDER AND FLEX HD (ACELLULAR HYDRATED DERMIS) Bilateral 02/24/2019   Procedure: BREAST RECONSTRUCTION WITH PLACEMENT OF TISSUE EXPANDER AND FLEX HD (ACELLULAR HYDRATED DERMIS);  Surgeon: Lowery Estefana RAMAN, DO;  Location: [facility] OR;  Service: Plastics;  Laterality:  Bilateral;   BREAST SURGERY     left breast bx   COLONOSCOPY     ESOPHAGOGASTRODUODENOSCOPY (EGD) WITH ESOPHAGEAL DILATION     EYE SURGERY     dart to the eye  (right)   FACIAL FRACTURE SURGERY     FRACTURE SURGERY     KNEE SURGERY     MASTECTOMY W/ SENTINEL NODE BIOPSY Bilateral 02/24/2019   Procedure: RIGHT MASTECTOMY WITH SENTINEL LYMPH NODE MAPPING AND LEFT PROPHYLACTIC MASTECTOMY;  Surgeon: Curvin Deward MOULD, MD;  Location: [facility] OR;  Service: General;  Laterality: Bilateral;   POLYPECTOMY     REMOVAL OF BILATERAL TISSUE EXPANDERS WITH PLACEMENT OF BILATERAL BREAST IMPLANTS Bilateral 06/30/2019   Procedure: REMOVAL OF BILATERAL TISSUE EXPANDERS WITH PLACEMENT OF BILATERAL BREAST IMPLANTS;  Surgeon: Lowery Estefana RAMAN, DO;  Location: [facility];  Service: Plastics;  Laterality: Bilateral;   SHOULDER SURGERY      OB History   No obstetric history on file.      Home Medications    Prior to Admission medications  Medication Sig Start Date End Date Taking? Authorizing Provider  naproxen  (NAPROSYN ) 500 MG tablet Take 1 tablet (500 mg total) by mouth 2 (two) times daily for 7 days. 12/22/24 12/29/24 Yes Lal, Aanchal, PA-C  omeprazole (PRILOSEC) 20 MG capsule Take 20 mg by mouth daily.   Yes [provider]  valACYclovir  (VALTREX )  1000 MG tablet Take 1 tablet (1,000 mg total) by mouth 3 (three) times daily for 7 days. 12/24/24 12/31/24 Yes Andra Corean BROCKS, PA-C  venlafaxine  XR (EFFEXOR -XR) 75 MG 24 hr capsule Take 1 capsule (75 mg total) by mouth daily with breakfast. 06/14/24  Yes Burton, Lacie K, NP  zolpidem  (AMBIEN ) 5 MG tablet TAKE 1 TABLET (5 MG TOTAL) BY MOUTH EVERY DAY AT BEDTIME AS NEEDED FOR SLEEP 11/22/24  Yes Hanford Powell BRAVO, NP    Family History Family History  Problem Relation Age of Onset   Brain cancer Father    Cancer Maternal Uncle        melanoma    Cancer Paternal Uncle        gastric cancer   Spinal muscular atrophy Daughter    Spinal  muscular atrophy Child    Breast cancer Neg Hx    Colon cancer Neg Hx    Esophageal cancer Neg Hx    Stomach cancer Neg Hx    Rectal cancer Neg Hx    Colon polyps Neg Hx    Crohn's disease Neg Hx     Social History Social History[1]   Allergies   Tramadol and Codeine   Review of Systems Review of Systems  Skin:  Positive for rash.     Physical Exam Triage Vital Signs ED Triage Vitals  Encounter Vitals Group     BP 12/24/24 1500 122/84     Girls Systolic BP Percentile --      Girls Diastolic BP Percentile --      Boys Systolic BP Percentile --      Boys Diastolic BP Percentile --      Pulse Rate 12/24/24 1500 77     Resp 12/24/24 1500 20     Temp 12/24/24 1500 97.7 F (36.5 C)     Temp Source 12/24/24 1500 Oral     SpO2 12/24/24 1500 96 %     Weight --      Height --      Head Circumference --      Peak Flow --      Pain Score 12/24/24 1513 8     Pain Loc --      Pain Education --      Exclude from Growth Chart --    No data found.  Updated Vital Signs BP 122/84 (BP Location: Left Arm)   Pulse 77   Temp 97.7 F (36.5 C) (Oral)   Resp 20   LMP  (LMP Unknown)   SpO2 96%   Visual Acuity Right Eye Distance:   Left Eye Distance:   Bilateral Distance:    Right Eye Near:   Left Eye Near:    Bilateral Near:     Physical Exam Vitals and nursing note reviewed.  Constitutional:      General: She is not in acute distress.    Appearance: Normal appearance. She is not ill-appearing, toxic-appearing or diaphoretic.  Eyes:     General: No scleral icterus. Cardiovascular:     Rate and Rhythm: Normal rate and regular rhythm.     Heart sounds: Normal heart sounds.  Pulmonary:     Effort: Pulmonary effort is normal. No respiratory distress.     Breath sounds: Normal breath sounds. No wheezing or rhonchi.  Skin:    General: Skin is warm.     Findings: Rash present. Rash is vesicular.     Comments: See picture attached to note  Neurological:  Mental  Status: She is alert and oriented to person, place, and time.  Psychiatric:        Mood and Affect: Mood normal.        Behavior: Behavior normal.      UC Treatments / Results  Labs (all labs ordered are listed, but only abnormal results are displayed) Labs Reviewed - No data to display  EKG   Radiology CT Angio Chest PE W and/or Wo Contrast Result Date: 12/22/2024 EXAM: CTA of the Chest with contrast for PE 12/22/2024 05:54:20 PM TECHNIQUE: CTA of the chest was performed after the administration of intravenous contrast. Multiplanar reformatted images are provided for review. MIP images are provided for review. Automated exposure control, iterative reconstruction, and/or weight based adjustment of the mA/kV was utilized to reduce the radiation dose to as low as reasonably achievable. COMPARISON: 09/27/2021 CLINICAL HISTORY: Chest wall pain, nontraumatic, malignancy known or suspected, xray done. FINDINGS: PULMONARY ARTERIES: Pulmonary arteries are adequately opacified for evaluation. No pulmonary embolism. Main pulmonary artery is normal in caliber. MEDIASTINUM: The heart and pericardium demonstrate no acute abnormality. There is no acute abnormality of the thoracic aorta. 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 LYMPH NODES: No mediastinal, hilar or axillary lymphadenopathy. LUNGS AND PLEURA: Unchanged 3 mm subpleural left lower lobe nodule (axial 75). Posterior bibasilar dependent atelectasis. 2 mm micronodule in the posterolateral left lower lobe (axial 73), also unchanged. 2 mm lateral right middle lobe micronodule (axial 63), also unchanged. Given the stability, these nodules are benign. No pleural effusion or pneumothorax. UPPER ABDOMEN: Limited images of the upper abdomen are unremarkable. SOFT TISSUES AND BONES: Bilateral retropectoral breast implants. Mild multilevel degenerative disc disease of the visualized spine. No acute bone or soft tissue abnormality.  IMPRESSION: 1. No pulmonary embolism. No pneumonia, pulmonary edema, or pleural effusion. 2. Similar appearance of a couple of scattered pulmonary nodules measuring up to 3 mm , as described above. Given the stability, these are all benign. Electronically signed by: Rogelia Myers MD MD 12/22/2024 06:07 PM EST RP Workstation: [id number]   DG Chest 2 View Result Date: 12/22/2024 EXAM: 2 VIEW(S) XRAY OF THE CHEST 12/22/2024 05:12:00 PM COMPARISON: None available. CLINICAL HISTORY: left chest pain FINDINGS: LUNGS AND PLEURA: No focal pulmonary opacity. No pleural effusion. No pneumothorax. HEART AND MEDIASTINUM: No acute abnormality of the cardiac and mediastinal silhouettes. BONES AND SOFT TISSUES: Surgical clips in left axilla. Surgical clips in right axilla. Mild-to-moderate multilevel thoracic spine degenerative disc changes. IMPRESSION: 1. No acute findings. Electronically signed by: Greig Pique MD MD 12/22/2024 05:22 PM EST RP Workstation: [id number]    Procedures Procedures (including critical care time)  Medications Ordered in UC Medications - No data to display  Initial Impression / Assessment and Plan / UC Course  I have reviewed the triage vital signs and the nursing notes.  Pertinent labs & imaging results that were available during my care of the patient were reviewed by me and considered in my medical decision making (see chart for details).    Final Clinical Impressions(s) / UC Diagnoses   Final diagnoses:  Herpes zoster without complication     Discharge Instructions      You have been diagnosed with shingles. Will send valacyclovir  to pharmacy for symptoms.    ED Prescriptions     Medication Sig Dispense Auth. Provider   valACYclovir  (VALTREX ) 1000 MG tablet Take 1 tablet (1,000 mg total) by mouth 3 (three) times daily for 7 days. 21 tablet Andra Corean BROCKS, PA-C  PDMP not reviewed this encounter.    [1]  Social History Tobacco Use   Smoking status:  Never    Passive exposure: Never   Smokeless tobacco: Never  Vaping Use   Vaping status: Never Used  Substance Use Topics   Alcohol use: Yes    Comment: occasional   Drug use: No     Andra Corean BROCKS, PA-C 12/24/24 1540  "

## 2024-12-24 NOTE — Discharge Instructions (Addendum)
 You have been diagnosed with shingles. Will send valacyclovir  to pharmacy for symptoms.

## 2024-12-30 ENCOUNTER — Other Ambulatory Visit: Payer: Self-pay | Admitting: Nurse Practitioner

## 2024-12-30 DIAGNOSIS — C50411 Malignant neoplasm of upper-outer quadrant of right female breast: Secondary | ICD-10-CM

## 2025-06-14 ENCOUNTER — Ambulatory Visit: Payer: Self-pay | Admitting: Nurse Practitioner

## 2025-06-14 ENCOUNTER — Other Ambulatory Visit: Payer: Self-pay
